# Patient Record
Sex: Female | Born: 1940
Health system: Southern US, Community
[De-identification: ages and names within clinical notes are randomized; demographics above are authoritative.]

## PROBLEM LIST (undated history)

## (undated) DIAGNOSIS — I639 Cerebral infarction, unspecified: Secondary | ICD-10-CM

## (undated) DIAGNOSIS — J45909 Unspecified asthma, uncomplicated: Secondary | ICD-10-CM

## (undated) DIAGNOSIS — F419 Anxiety disorder, unspecified: Secondary | ICD-10-CM

## (undated) DIAGNOSIS — R51 Headache: Secondary | ICD-10-CM

## (undated) DIAGNOSIS — F32A Depression, unspecified: Secondary | ICD-10-CM

## (undated) DIAGNOSIS — C50919 Malignant neoplasm of unspecified site of unspecified female breast: Secondary | ICD-10-CM

## (undated) DIAGNOSIS — R519 Headache, unspecified: Secondary | ICD-10-CM

## (undated) DIAGNOSIS — K219 Gastro-esophageal reflux disease without esophagitis: Secondary | ICD-10-CM

## (undated) DIAGNOSIS — E785 Hyperlipidemia, unspecified: Secondary | ICD-10-CM

## (undated) DIAGNOSIS — D649 Anemia, unspecified: Secondary | ICD-10-CM

## (undated) DIAGNOSIS — G4730 Sleep apnea, unspecified: Secondary | ICD-10-CM

## (undated) DIAGNOSIS — M1990 Unspecified osteoarthritis, unspecified site: Secondary | ICD-10-CM

## (undated) DIAGNOSIS — C801 Malignant (primary) neoplasm, unspecified: Secondary | ICD-10-CM

## (undated) DIAGNOSIS — F329 Major depressive disorder, single episode, unspecified: Secondary | ICD-10-CM

## (undated) HISTORY — PX: NASAL SINUS SURGERY: SHX719

## (undated) HISTORY — PX: ABDOMINAL HYSTERECTOMY: SHX81

## (undated) HISTORY — DX: Cerebral infarction, unspecified: I63.9

## (undated) HISTORY — DX: Malignant neoplasm of unspecified site of unspecified female breast: C50.919

## (undated) HISTORY — PX: CHOLECYSTECTOMY: SHX55

## (undated) HISTORY — DX: Unspecified asthma, uncomplicated: J45.909

## (undated) HISTORY — DX: Anxiety disorder, unspecified: F41.9

## (undated) HISTORY — DX: Hyperlipidemia, unspecified: E78.5

## (undated) HISTORY — DX: Major depressive disorder, single episode, unspecified: F32.9

## (undated) HISTORY — DX: Depression, unspecified: F32.A

## (undated) HISTORY — DX: Gastro-esophageal reflux disease without esophagitis: K21.9

## (undated) HISTORY — PX: BLADDER REPAIR: SHX76

---

## 1991-03-23 HISTORY — PX: BREAST BIOPSY: SHX20

## 2002-02-26 DIAGNOSIS — I639 Cerebral infarction, unspecified: Secondary | ICD-10-CM

## 2002-02-26 HISTORY — DX: Cerebral infarction, unspecified: I63.9

## 2003-02-27 HISTORY — PX: COLONOSCOPY: SHX174

## 2003-09-19 ENCOUNTER — Other Ambulatory Visit: Payer: Self-pay

## 2003-11-29 ENCOUNTER — Ambulatory Visit: Payer: Self-pay

## 2004-02-18 ENCOUNTER — Ambulatory Visit: Payer: Self-pay | Admitting: Rheumatology

## 2004-03-02 ENCOUNTER — Ambulatory Visit: Payer: Self-pay

## 2004-03-20 ENCOUNTER — Ambulatory Visit: Payer: Self-pay

## 2004-04-05 ENCOUNTER — Ambulatory Visit: Payer: Self-pay

## 2004-06-20 ENCOUNTER — Ambulatory Visit: Payer: Self-pay | Admitting: Orthopedic Surgery

## 2006-08-07 ENCOUNTER — Ambulatory Visit: Payer: Self-pay

## 2007-07-01 ENCOUNTER — Emergency Department: Payer: Self-pay | Admitting: Emergency Medicine

## 2008-01-08 DIAGNOSIS — I10 Essential (primary) hypertension: Secondary | ICD-10-CM | POA: Insufficient documentation

## 2008-01-08 DIAGNOSIS — K219 Gastro-esophageal reflux disease without esophagitis: Secondary | ICD-10-CM | POA: Insufficient documentation

## 2008-01-08 DIAGNOSIS — J45909 Unspecified asthma, uncomplicated: Secondary | ICD-10-CM | POA: Insufficient documentation

## 2008-02-05 ENCOUNTER — Ambulatory Visit: Payer: Self-pay | Admitting: Family Medicine

## 2008-05-10 ENCOUNTER — Ambulatory Visit: Payer: Self-pay | Admitting: Family Medicine

## 2008-06-30 ENCOUNTER — Ambulatory Visit: Payer: Self-pay | Admitting: Family Medicine

## 2009-04-27 ENCOUNTER — Ambulatory Visit: Payer: Self-pay | Admitting: Family Medicine

## 2009-06-09 ENCOUNTER — Ambulatory Visit: Payer: Self-pay | Admitting: Family Medicine

## 2010-02-26 HISTORY — PX: MR MRA CAROTID: HXRAD135

## 2010-03-21 ENCOUNTER — Observation Stay: Payer: Self-pay | Admitting: Internal Medicine

## 2010-12-18 ENCOUNTER — Ambulatory Visit: Payer: Self-pay | Admitting: Family Medicine

## 2011-03-27 ENCOUNTER — Ambulatory Visit: Payer: Self-pay | Admitting: Family Medicine

## 2011-12-20 ENCOUNTER — Ambulatory Visit: Payer: Self-pay | Admitting: Family Medicine

## 2011-12-20 LAB — COMPREHENSIVE METABOLIC PANEL
Alkaline Phosphatase: 86 U/L (ref 50–136)
Anion Gap: 5 — ABNORMAL LOW (ref 7–16)
BUN: 13 mg/dL (ref 7–18)
Calcium, Total: 9.5 mg/dL (ref 8.5–10.1)
Chloride: 108 mmol/L — ABNORMAL HIGH (ref 98–107)
Co2: 29 mmol/L (ref 21–32)
EGFR (Non-African Amer.): >60
Glucose: 89 mg/dL (ref 65–99)
Osmolality: 283 (ref 275–301)
SGOT(AST): 21 U/L (ref 15–37)
Sodium: 142 mmol/L (ref 136–145)

## 2011-12-20 LAB — LIPASE, BLOOD: Lipase: 125 U/L (ref 73–393)

## 2011-12-20 LAB — CBC WITH DIFFERENTIAL/PLATELET
Basophil #: 0 10*3/uL (ref 0.0–0.1)
Eosinophil #: 0.1 10*3/uL (ref 0.0–0.7)
Lymphocyte %: 39.7 %
MCH: 30.8 pg (ref 26.0–34.0)
MCHC: 33.9 g/dL (ref 32.0–36.0)
Monocyte #: 0.6 x10 3/mm (ref 0.2–0.9)
Neutrophil %: 50.7 %
Platelet: 301 10*3/uL (ref 150–440)
RDW: 13.3 % (ref 11.5–14.5)

## 2011-12-20 LAB — TSH: Thyroid Stimulating Horm: 0.984 u[IU]/mL

## 2012-02-05 ENCOUNTER — Ambulatory Visit: Payer: Self-pay | Admitting: Family Medicine

## 2012-07-18 ENCOUNTER — Other Ambulatory Visit: Payer: Self-pay | Admitting: Family Medicine

## 2012-07-18 LAB — CBC WITH DIFFERENTIAL/PLATELET
Basophil #: 0.1 10*3/uL (ref 0.0–0.1)
Eosinophil %: 1.6 %
HCT: 39.2 % (ref 35.0–47.0)
HGB: 13 g/dL (ref 12.0–16.0)
Lymphocyte %: 32.2 %
MCHC: 33.3 g/dL (ref 32.0–36.0)
MCV: 90 fL (ref 80–100)
Neutrophil #: 5.2 10*3/uL (ref 1.4–6.5)
Neutrophil %: 56.9 %
Platelet: 286 10*3/uL (ref 150–440)
RBC: 4.38 10*6/uL (ref 3.80–5.20)
WBC: 9.2 10*3/uL (ref 3.6–11.0)

## 2012-07-18 LAB — COMPREHENSIVE METABOLIC PANEL
Albumin: 3.8 g/dL (ref 3.4–5.0)
Alkaline Phosphatase: 109 U/L (ref 50–136)
Anion Gap: 2 — ABNORMAL LOW (ref 7–16)
BUN: 14 mg/dL (ref 7–18)
Bilirubin,Total: 0.3 mg/dL (ref 0.2–1.0)
Calcium, Total: 9.5 mg/dL (ref 8.5–10.1)
Chloride: 109 mmol/L — ABNORMAL HIGH (ref 98–107)
Co2: 31 mmol/L (ref 21–32)
Creatinine: 0.87 mg/dL (ref 0.60–1.30)
EGFR (African American): >60
Glucose: 115 mg/dL — ABNORMAL HIGH (ref 65–99)
Osmolality: 285 (ref 275–301)
Potassium: 4.1 mmol/L (ref 3.5–5.1)
SGOT(AST): 17 U/L (ref 15–37)
SGPT (ALT): 21 U/L (ref 12–78)
Sodium: 142 mmol/L (ref 136–145)

## 2012-07-18 LAB — FOLATE: Folic Acid: 14.7 ng/mL (ref 3.1–100.0)

## 2012-09-12 ENCOUNTER — Other Ambulatory Visit: Payer: Self-pay | Admitting: Radiology

## 2012-12-31 ENCOUNTER — Ambulatory Visit: Payer: Self-pay | Admitting: Family Medicine

## 2013-01-01 ENCOUNTER — Other Ambulatory Visit: Payer: Self-pay | Admitting: Family Medicine

## 2013-01-01 LAB — CBC WITH DIFFERENTIAL/PLATELET
Basophil #: 0 10*3/uL (ref 0.0–0.1)
Eosinophil #: 0.1 10*3/uL (ref 0.0–0.7)
Eosinophil %: 1.2 %
HCT: 39.6 % (ref 35.0–47.0)
Lymphocyte #: 2.6 10*3/uL (ref 1.0–3.6)
MCH: 30.7 pg (ref 26.0–34.0)
MCHC: 33.9 g/dL (ref 32.0–36.0)
MCV: 91 fL (ref 80–100)
Monocyte #: 0.6 x10 3/mm (ref 0.2–0.9)
Monocyte %: 9 %
Neutrophil #: 3.5 10*3/uL (ref 1.4–6.5)
Neutrophil %: 51.6 %
Platelet: 269 10*3/uL (ref 150–440)
RBC: 4.37 10*6/uL (ref 3.80–5.20)
RDW: 13.5 % (ref 11.5–14.5)
WBC: 6.8 10*3/uL (ref 3.6–11.0)

## 2013-01-01 LAB — COMPREHENSIVE METABOLIC PANEL
Albumin: 3.9 g/dL (ref 3.4–5.0)
Anion Gap: 4 — ABNORMAL LOW (ref 7–16)
Chloride: 108 mmol/L — ABNORMAL HIGH (ref 98–107)
Creatinine: 0.71 mg/dL (ref 0.60–1.30)
Glucose: 104 mg/dL — ABNORMAL HIGH (ref 65–99)
Potassium: 4.1 mmol/L (ref 3.5–5.1)
SGPT (ALT): 18 U/L (ref 12–78)
Total Protein: 7 g/dL (ref 6.4–8.2)

## 2013-01-01 LAB — FOLATE: Folic Acid: 14.3 ng/mL (ref 3.1–100.0)

## 2013-01-01 LAB — LIPID PANEL
Ldl Cholesterol, Calc: 121 mg/dL — ABNORMAL HIGH (ref 0–100)
Triglycerides: 130 mg/dL (ref 0–200)

## 2013-01-01 LAB — HEMOGLOBIN A1C: Hemoglobin A1C: 6.7 % — ABNORMAL HIGH (ref 4.2–6.3)

## 2013-01-01 LAB — PRO B NATRIURETIC PEPTIDE: B-Type Natriuretic Peptide: 91 pg/mL (ref 0–125)

## 2013-02-10 ENCOUNTER — Other Ambulatory Visit: Payer: Self-pay | Admitting: Family Medicine

## 2013-02-10 LAB — COMPREHENSIVE METABOLIC PANEL
Alkaline Phosphatase: 93 U/L
Anion Gap: 2 — ABNORMAL LOW (ref 7–16)
BUN: 10 mg/dL (ref 7–18)
Bilirubin,Total: 0.7 mg/dL (ref 0.2–1.0)
Creatinine: 0.66 mg/dL (ref 0.60–1.30)
EGFR (African American): >60
SGOT(AST): 23 U/L (ref 15–37)
Sodium: 139 mmol/L (ref 136–145)
Total Protein: 7.1 g/dL (ref 6.4–8.2)

## 2013-02-10 LAB — CK: CK, Total: 78 U/L (ref 21–215)

## 2013-02-10 LAB — LIPID PANEL
HDL Cholesterol: 53 mg/dL (ref 40–60)
Triglycerides: 117 mg/dL (ref 0–200)

## 2013-03-24 ENCOUNTER — Other Ambulatory Visit: Payer: Self-pay | Admitting: Family Medicine

## 2013-03-24 LAB — CBC WITH DIFFERENTIAL/PLATELET
BASOS ABS: 0 10*3/uL (ref 0.0–0.1)
BASOS PCT: 0.7 %
EOS PCT: 2.9 %
Eosinophil #: 0.2 10*3/uL (ref 0.0–0.7)
HCT: 42.1 % (ref 35.0–47.0)
HGB: 13.8 g/dL (ref 12.0–16.0)
Lymphocyte #: 2.5 10*3/uL (ref 1.0–3.6)
Lymphocyte %: 39.3 %
MCH: 30.1 pg (ref 26.0–34.0)
MCHC: 32.7 g/dL (ref 32.0–36.0)
MCV: 92 fL (ref 80–100)
Monocyte #: 0.7 x10 3/mm (ref 0.2–0.9)
Monocyte %: 11 %
Neutrophil #: 2.9 10*3/uL (ref 1.4–6.5)
Neutrophil %: 46.1 %
PLATELETS: 261 10*3/uL (ref 150–440)
RBC: 4.58 10*6/uL (ref 3.80–5.20)
RDW: 13.8 % (ref 11.5–14.5)
WBC: 6.2 10*3/uL (ref 3.6–11.0)

## 2013-03-24 LAB — TSH: Thyroid Stimulating Horm: 1.41 u[IU]/mL

## 2013-03-24 LAB — COMPREHENSIVE METABOLIC PANEL
ALBUMIN: 3.8 g/dL (ref 3.4–5.0)
AST: 20 U/L (ref 15–37)
Alkaline Phosphatase: 92 U/L
Anion Gap: 5 — ABNORMAL LOW (ref 7–16)
BUN: 10 mg/dL (ref 7–18)
Bilirubin,Total: 0.6 mg/dL (ref 0.2–1.0)
CHLORIDE: 105 mmol/L (ref 98–107)
CREATININE: 0.59 mg/dL — AB (ref 0.60–1.30)
Calcium, Total: 9.6 mg/dL (ref 8.5–10.1)
Co2: 27 mmol/L (ref 21–32)
EGFR (African American): >60
EGFR (Non-African Amer.): >60
Glucose: 110 mg/dL — ABNORMAL HIGH (ref 65–99)
Osmolality: 274 (ref 275–301)
Potassium: 3.7 mmol/L (ref 3.5–5.1)
SGPT (ALT): 16 U/L (ref 12–78)
SODIUM: 137 mmol/L (ref 136–145)
TOTAL PROTEIN: 7.1 g/dL (ref 6.4–8.2)

## 2013-06-22 ENCOUNTER — Other Ambulatory Visit: Payer: Self-pay | Admitting: Family Medicine

## 2013-06-22 LAB — CBC WITH DIFFERENTIAL/PLATELET
Basophil #: 0 10*3/uL (ref 0.0–0.1)
Basophil %: 0.5 %
EOS PCT: 1.4 %
Eosinophil #: 0.1 10*3/uL (ref 0.0–0.7)
HCT: 41.8 % (ref 35.0–47.0)
HGB: 14.1 g/dL (ref 12.0–16.0)
Lymphocyte #: 2.3 10*3/uL (ref 1.0–3.6)
Lymphocyte %: 34.4 %
MCH: 30.8 pg (ref 26.0–34.0)
MCHC: 33.7 g/dL (ref 32.0–36.0)
MCV: 92 fL (ref 80–100)
Monocyte #: 0.6 x10 3/mm (ref 0.2–0.9)
Monocyte %: 9.2 %
Neutrophil #: 3.7 10*3/uL (ref 1.4–6.5)
Neutrophil %: 54.5 %
Platelet: 251 10*3/uL (ref 150–440)
RBC: 4.56 10*6/uL (ref 3.80–5.20)
RDW: 14.1 % (ref 11.5–14.5)
WBC: 6.8 10*3/uL (ref 3.6–11.0)

## 2013-06-22 LAB — COMPREHENSIVE METABOLIC PANEL
ANION GAP: 5 — AB (ref 7–16)
AST: 18 U/L (ref 15–37)
Albumin: 3.7 g/dL (ref 3.4–5.0)
Alkaline Phosphatase: 93 U/L
BUN: 9 mg/dL (ref 7–18)
Bilirubin,Total: 0.6 mg/dL (ref 0.2–1.0)
CHLORIDE: 108 mmol/L — AB (ref 98–107)
CO2: 27 mmol/L (ref 21–32)
Calcium, Total: 9.3 mg/dL (ref 8.5–10.1)
Creatinine: 0.62 mg/dL (ref 0.60–1.30)
Glucose: 113 mg/dL — ABNORMAL HIGH (ref 65–99)
Osmolality: 279 (ref 275–301)
Potassium: 4.7 mmol/L (ref 3.5–5.1)
SGPT (ALT): 19 U/L (ref 12–78)
Sodium: 140 mmol/L (ref 136–145)
Total Protein: 6.9 g/dL (ref 6.4–8.2)

## 2013-06-22 LAB — HEMOGLOBIN A1C: Hemoglobin A1C: 6.6 % — ABNORMAL HIGH (ref 4.2–6.3)

## 2013-06-22 LAB — LIPID PANEL
CHOLESTEROL: 198 mg/dL (ref 0–200)
HDL: 45 mg/dL (ref 40–60)
Ldl Cholesterol, Calc: 131 mg/dL — ABNORMAL HIGH (ref 0–100)
Triglycerides: 112 mg/dL (ref 0–200)
VLDL Cholesterol, Calc: 22 mg/dL (ref 5–40)

## 2013-06-22 LAB — TSH: THYROID STIMULATING HORM: 0.596 u[IU]/mL

## 2013-06-25 ENCOUNTER — Encounter: Payer: Self-pay | Admitting: General Surgery

## 2013-07-22 ENCOUNTER — Ambulatory Visit: Payer: Self-pay | Admitting: Family Medicine

## 2013-07-22 ENCOUNTER — Ambulatory Visit: Payer: Self-pay | Admitting: General Surgery

## 2013-07-30 ENCOUNTER — Ambulatory Visit (INDEPENDENT_AMBULATORY_CARE_PROVIDER_SITE_OTHER): Payer: Medicare Other | Admitting: General Surgery

## 2013-07-30 ENCOUNTER — Encounter: Payer: Self-pay | Admitting: General Surgery

## 2013-07-30 VITALS — BP 150/70 | HR 76 | Resp 14 | Ht 63.0 in | Wt 173.0 lb

## 2013-07-30 DIAGNOSIS — Z1211 Encounter for screening for malignant neoplasm of colon: Secondary | ICD-10-CM

## 2013-07-30 MED ORDER — POLYETHYLENE GLYCOL 3350 17 GM/SCOOP PO POWD: 1.0000 | Freq: Once | ORAL | Status: DC

## 2013-07-30 NOTE — Patient Instructions (Addendum)
Colonoscopy A colonoscopy is an exam to look at the entire large intestine (colon). This exam can help find problems such as tumors, polyps, inflammation, and areas of bleeding. The exam takes about 1 hour.  LET [facility] CARE PROVIDER KNOW ABOUT:   Any allergies you have.  All medicines you are taking, including vitamins, herbs, eye drops, creams, and over-the-counter medicines.  Previous problems you or members of your family have had with the use of anesthetics.  Any blood disorders you have.  Previous surgeries you have had.  Medical conditions you have. RISKS AND COMPLICATIONS  Generally, this is a safe procedure. However, as with any procedure, complications can occur. Possible complications include:  Bleeding.  Tearing or rupture of the colon wall.  Reaction to medicines given during the exam.  Infection (rare). BEFORE THE PROCEDURE   Ask your health care provider about changing or stopping your regular medicines.  You may be prescribed an oral bowel prep. This involves drinking a large amount of medicated liquid, starting the day before your procedure. The liquid will cause you to have multiple loose stools until your stool is almost clear or light green. This cleans out your colon in preparation for the procedure.  Do not eat or drink anything else once you have started the bowel prep, unless your health care provider tells you it is safe to do so.  Arrange for someone to drive you home after the procedure. PROCEDURE   You will be given medicine to help you relax (sedative).  You will lie on your side with your knees bent.  A long, flexible tube with a light and camera on the end (colonoscope) will be inserted through the rectum and into the colon. The camera sends video back to a computer screen as it moves through the colon. The colonoscope also releases carbon dioxide gas to inflate the colon. This helps your health care provider see the area better.  During  the exam, your health care provider may take a small tissue sample (biopsy) to be examined under a microscope if any abnormalities are found.  The exam is finished when the entire colon has been viewed. AFTER THE PROCEDURE   Do not drive for 24 hours after the exam.  You may have a small amount of blood in your stool.  You may pass moderate amounts of gas and have mild abdominal cramping or bloating. This is caused by the gas used to inflate your colon during the exam.  Ask when your test results will be ready and how you will get your results. Make sure you get your test results. Document Released: 02/10/2000 Document Revised: 12/03/2012 Document Reviewed: 10/20/2012 [facility] Patient Information 2014 [place].  Patient is scheduled for a colonoscopy at [facility] on 09/08/13. She will hold her Plavix for one week prior. Patient is aware to pre register with the hospital at least 2 days prior. Miralax prescription has been sent into her pharmacy. Patient is aware of date and instructions.

## 2013-07-30 NOTE — Progress Notes (Signed)
Patient ID: Norma Johns, female   DOB: 03-11-1940, 73 y.o.   MRN: [id number]  Chief Complaint  Patient presents with  . Other    screening colonoscopy    HPI Norma Johns is a 73 y.o. female.  Here today to discuss having a colonoscopy. States that she had a Colonoscopy 10 years ago. Denies any gastrointestinal issues. She has been maintained on Plavix since a CVA in 2004.   HPI  Past Medical History  Diagnosis Date  . GERD (gastroesophageal reflux disease)   . Diabetes mellitus without complication   . Hyperlipidemia   . Asthma   . Stroke 2004    Past Surgical History  Procedure Laterality Date  . Abdominal hysterectomy    . Bladder repair    . Breast biopsy    . Cholecystectomy    . Colonoscopy  2005    Family History  Problem Relation Age of Onset  . Cancer Father   . Heart attack Mother     Social History History  Substance Use Topics  . Smoking status: Never Smoker   . Smokeless tobacco: Never Used  . Alcohol Use: No    Allergies  Allergen Reactions  . Minocycline Hcl Itching  . Penicillins Itching  . Septra [Sulfamethoxazole-Trimethoprim]   . Vioxx [Rofecoxib]     Current Outpatient Prescriptions  Medication Sig Dispense Refill  . albuterol (PROVENTIL) (2.5 MG/3ML) 0.083% nebulizer solution Take 2.5 mg by nebulization every 6 (six) hours as needed for wheezing or shortness of breath.      Marland Kitchen aspirin 81 MG tablet Take 81 mg by mouth daily.      . ciprofloxacin (CIPRO) 250 MG tablet       . clopidogrel (PLAVIX) 75 MG tablet       . dexlansoprazole (DEXILANT) 60 MG capsule Take 60 mg by mouth daily.      Marland Kitchen ESTRACE VAGINAL 0.1 MG/GM vaginal cream       . fexofenadine (ALLEGRA) 180 MG tablet Take 180 mg by mouth daily.      . fluticasone (FLONASE) 50 MCG/ACT nasal spray       . Multiple Vitamin (MULTIVITAMIN) capsule Take 1 capsule by mouth daily.      . vitamin B-12 (CYANOCOBALAMIN) 500 MCG tablet Take 500 mcg by mouth daily.      . vitamin E  (VITAMIN E) 400 UNIT capsule Take 400 Units by mouth daily.      . polyethylene glycol powder (GLYCOLAX/MIRALAX) powder Take 255 g (1 Container total) by mouth once.  255 g  0   No current facility-administered medications for this visit.    Review of Systems Review of Systems  Constitutional: Negative.   Respiratory: Negative.   Cardiovascular: Negative.   Gastrointestinal: Positive for constipation and abdominal distention. Negative for nausea, vomiting, abdominal pain, diarrhea, blood in stool, anal bleeding and rectal pain.    Blood pressure 150/70, pulse 76, resp. rate 14, height 5\' 3"  (1.6 m), weight 173 lb (78.472 kg).  Physical Exam Physical Exam  Constitutional: She is oriented to person, place, and time. She appears well-developed and well-nourished.  Eyes: Conjunctivae are normal. No scleral icterus.  Neck: Neck supple.  Cardiovascular: Normal rate, regular rhythm and normal heart sounds.   Pulmonary/Chest: Effort normal and breath sounds normal.  Abdominal: Soft. Normal appearance and bowel sounds are normal. There is no tenderness.  Neurological: She is alert and oriented to person, place, and time.  Skin: Skin is warm and dry.  Data Reviewed Primary care note stated June 19, 2013 were reviewed.  Assessment    Candidate for screening colonoscopy.  Need to discontinue Plavix therapy prior to the procedure.     Plan    The risks associated with screening colonoscopy were reviewed including bleeding and perforation. The need to discontinue Plavix to minimize excessive bleeding should polypectomy required was reviewed. She should continue her 81 mg aspirin tablet daily. Written instructions regarding preparation were provided.     Patient is scheduled for a colonoscopy at [facility] on 09/08/13. She will hold her Plavix for one week prior. Patient is aware to pre register with the hospital at least 2 days prior. Miralax prescription has been sent into her pharmacy.  Patient is aware of date and instructions.   PCP: Lelan Pons Byrnett 07/31/2013, 9:26 PM

## 2013-07-31 DIAGNOSIS — Z1211 Encounter for screening for malignant neoplasm of colon: Secondary | ICD-10-CM | POA: Insufficient documentation

## 2013-08-05 ENCOUNTER — Other Ambulatory Visit: Payer: Self-pay | Admitting: General Surgery

## 2013-08-05 DIAGNOSIS — Z1211 Encounter for screening for malignant neoplasm of colon: Secondary | ICD-10-CM

## 2013-09-02 ENCOUNTER — Telehealth: Payer: Self-pay | Admitting: *Deleted

## 2013-09-02 NOTE — Telephone Encounter (Signed)
Message left for patient to call the office.   Patient is scheduled for a colonoscopy at [facility] for 09-08-13. We need to verify that the patient has not had a change in her medications since her last office visit. Also, need to verify that she has pre-registered and has her Miralax prescription.  Please confirm the patient has discontinued her Plavix.

## 2013-09-03 ENCOUNTER — Telehealth: Payer: Self-pay | Admitting: *Deleted

## 2013-09-03 NOTE — Telephone Encounter (Signed)
Another message has been left for patient to call the office.

## 2013-09-07 ENCOUNTER — Telehealth: Payer: Self-pay | Admitting: *Deleted

## 2013-09-07 NOTE — Telephone Encounter (Signed)
Another message left for patient to call the office.

## 2013-09-08 ENCOUNTER — Ambulatory Visit: Payer: Self-pay | Admitting: General Surgery

## 2013-09-08 DIAGNOSIS — D128 Benign neoplasm of rectum: Secondary | ICD-10-CM

## 2013-09-08 DIAGNOSIS — D129 Benign neoplasm of anus and anal canal: Secondary | ICD-10-CM

## 2013-09-09 ENCOUNTER — Encounter: Payer: Self-pay | Admitting: General Surgery

## 2013-09-10 ENCOUNTER — Telehealth: Payer: Self-pay | Admitting: *Deleted

## 2013-09-10 LAB — PATHOLOGY REPORT

## 2013-09-10 NOTE — Telephone Encounter (Signed)
Message copied by Carson Myrtle on Thu Sep 10, 2013  4:22 PM ------      Message from: [place], [place]      Created: Thu Sep 10, 2013  4:21 PM       These notify the patient that the tissue removed from her recent colonoscopy was entirely benign. She is off the hook for 10 years or 100,000 miles, whichever comes first. ------

## 2013-09-11 ENCOUNTER — Encounter: Payer: Self-pay | Admitting: General Surgery

## 2013-09-11 NOTE — Telephone Encounter (Signed)
Left message for patient call our office back. Patient needs to be notified of resent colonoscopy results.

## 2013-09-15 ENCOUNTER — Telehealth: Payer: Self-pay

## 2013-09-15 NOTE — Telephone Encounter (Signed)
Patient called and is looking for her pathology results. She left the number to her sisters home as she does not have a phone any longer. She said that if she is not there when you call that you may leave the results with her sister Rosaleigh Brazzel has been signed.

## 2013-09-16 NOTE — Telephone Encounter (Signed)
Path on polyp fine. Repeat exam in 10 years.

## 2013-12-20 ENCOUNTER — Emergency Department: Payer: Self-pay | Admitting: Emergency Medicine

## 2013-12-20 LAB — URINALYSIS, COMPLETE
RBC,UR: 40303 /HPF (ref 0–5)
SPECIFIC GRAVITY: 1.01 (ref 1.003–1.030)
Squamous Epithelial: NONE SEEN

## 2013-12-20 LAB — BASIC METABOLIC PANEL
Anion Gap: 7 (ref 7–16)
BUN: 14 mg/dL (ref 7–18)
CALCIUM: 9.5 mg/dL (ref 8.5–10.1)
CO2: 26 mmol/L (ref 21–32)
CREATININE: 0.65 mg/dL (ref 0.60–1.30)
Chloride: 107 mmol/L (ref 98–107)
EGFR (Non-African Amer.): >60
GLUCOSE: 149 mg/dL — AB (ref 65–99)
Osmolality: 283 (ref 275–301)
Potassium: 4.1 mmol/L (ref 3.5–5.1)
Sodium: 140 mmol/L (ref 136–145)

## 2013-12-20 LAB — CBC
HCT: 40.3 % (ref 35.0–47.0)
HGB: 13.1 g/dL (ref 12.0–16.0)
MCH: 29.6 pg (ref 26.0–34.0)
MCHC: 32.4 g/dL (ref 32.0–36.0)
MCV: 91 fL (ref 80–100)
Platelet: 256 10*3/uL (ref 150–440)
RBC: 4.4 10*6/uL (ref 3.80–5.20)
RDW: 14 % (ref 11.5–14.5)
WBC: 17.3 10*3/uL — ABNORMAL HIGH (ref 3.6–11.0)

## 2013-12-22 LAB — URINE CULTURE

## 2013-12-23 ENCOUNTER — Ambulatory Visit: Payer: Self-pay | Admitting: Family Medicine

## 2013-12-23 LAB — CBC WITH DIFFERENTIAL/PLATELET
BASOS ABS: 0 10*3/uL (ref 0.0–0.1)
Basophil %: 0.6 %
EOS PCT: 1.5 %
Eosinophil #: 0.1 10*3/uL (ref 0.0–0.7)
HCT: 38.6 % (ref 35.0–47.0)
HGB: 12.7 g/dL (ref 12.0–16.0)
LYMPHS ABS: 2.4 10*3/uL (ref 1.0–3.6)
Lymphocyte %: 30.9 %
MCH: 30.4 pg (ref 26.0–34.0)
MCHC: 32.9 g/dL (ref 32.0–36.0)
MCV: 92 fL (ref 80–100)
MONOS PCT: 9.7 %
Monocyte #: 0.7 x10 3/mm (ref 0.2–0.9)
NEUTROS PCT: 57.3 %
Neutrophil #: 4.4 10*3/uL (ref 1.4–6.5)
PLATELETS: 279 10*3/uL (ref 150–440)
RBC: 4.18 10*6/uL (ref 3.80–5.20)
RDW: 13.5 % (ref 11.5–14.5)
WBC: 7.7 10*3/uL (ref 3.6–11.0)

## 2013-12-28 ENCOUNTER — Encounter: Payer: Self-pay | Admitting: General Surgery

## 2014-05-11 ENCOUNTER — Other Ambulatory Visit: Payer: Self-pay | Admitting: Family Medicine

## 2014-05-11 DIAGNOSIS — R7309 Other abnormal glucose: Secondary | ICD-10-CM | POA: Diagnosis not present

## 2014-05-11 DIAGNOSIS — N3001 Acute cystitis with hematuria: Secondary | ICD-10-CM | POA: Diagnosis not present

## 2014-05-11 DIAGNOSIS — F419 Anxiety disorder, unspecified: Secondary | ICD-10-CM | POA: Diagnosis not present

## 2014-05-11 DIAGNOSIS — R5383 Other fatigue: Secondary | ICD-10-CM | POA: Diagnosis not present

## 2014-05-11 DIAGNOSIS — E780 Pure hypercholesterolemia: Secondary | ICD-10-CM | POA: Diagnosis not present

## 2014-05-24 DIAGNOSIS — F329 Major depressive disorder, single episode, unspecified: Secondary | ICD-10-CM | POA: Diagnosis not present

## 2014-05-24 DIAGNOSIS — N3001 Acute cystitis with hematuria: Secondary | ICD-10-CM | POA: Diagnosis not present

## 2014-08-16 ENCOUNTER — Other Ambulatory Visit: Payer: Self-pay | Admitting: Family Medicine

## 2014-08-16 DIAGNOSIS — K219 Gastro-esophageal reflux disease without esophagitis: Secondary | ICD-10-CM | POA: Insufficient documentation

## 2014-08-24 ENCOUNTER — Other Ambulatory Visit: Payer: Self-pay

## 2014-08-24 DIAGNOSIS — N952 Postmenopausal atrophic vaginitis: Secondary | ICD-10-CM | POA: Insufficient documentation

## 2014-08-24 DIAGNOSIS — Z6829 Body mass index (BMI) 29.0-29.9, adult: Secondary | ICD-10-CM | POA: Insufficient documentation

## 2014-08-24 DIAGNOSIS — H0520 Unspecified exophthalmos: Secondary | ICD-10-CM | POA: Insufficient documentation

## 2014-08-24 DIAGNOSIS — M810 Age-related osteoporosis without current pathological fracture: Secondary | ICD-10-CM | POA: Insufficient documentation

## 2014-08-24 DIAGNOSIS — J341 Cyst and mucocele of nose and nasal sinus: Secondary | ICD-10-CM | POA: Insufficient documentation

## 2014-08-24 DIAGNOSIS — F419 Anxiety disorder, unspecified: Secondary | ICD-10-CM | POA: Insufficient documentation

## 2014-08-24 DIAGNOSIS — M1990 Unspecified osteoarthritis, unspecified site: Secondary | ICD-10-CM | POA: Insufficient documentation

## 2014-09-06 ENCOUNTER — Ambulatory Visit (INDEPENDENT_AMBULATORY_CARE_PROVIDER_SITE_OTHER): Payer: Medicare Other | Admitting: Family Medicine

## 2014-09-06 ENCOUNTER — Encounter: Payer: Self-pay | Admitting: Family Medicine

## 2014-09-06 VITALS — BP 138/86 | HR 64 | Temp 97.7°F | Resp 16 | Ht 64.0 in | Wt 170.0 lb

## 2014-09-06 DIAGNOSIS — F32A Depression, unspecified: Secondary | ICD-10-CM

## 2014-09-06 DIAGNOSIS — I6529 Occlusion and stenosis of unspecified carotid artery: Secondary | ICD-10-CM | POA: Diagnosis not present

## 2014-09-06 DIAGNOSIS — I10 Essential (primary) hypertension: Secondary | ICD-10-CM | POA: Diagnosis not present

## 2014-09-06 DIAGNOSIS — F329 Major depressive disorder, single episode, unspecified: Secondary | ICD-10-CM | POA: Diagnosis not present

## 2014-09-06 DIAGNOSIS — K219 Gastro-esophageal reflux disease without esophagitis: Secondary | ICD-10-CM

## 2014-09-06 DIAGNOSIS — E119 Type 2 diabetes mellitus without complications: Secondary | ICD-10-CM

## 2014-09-06 MED ORDER — CLOPIDOGREL BISULFATE 75 MG PO TABS: 75.0000 mg | ORAL_TABLET | Freq: Every day | ORAL | Status: DC

## 2014-09-06 MED ORDER — DEXLANSOPRAZOLE 60 MG PO CPDR: 60.0000 mg | DELAYED_RELEASE_CAPSULE | Freq: Every day | ORAL | Status: DC

## 2014-09-06 NOTE — Progress Notes (Signed)
Subjective:    Patient ID: Norma Johns, female    DOB: 1940-05-25, 74 y.o.   MRN: [id number]  HPI     Depression: Patient here to follow up on depression. The last office visit was 05/24/2011. Onset followed a family event (health problems (husband)).  Patient denies any depression symptoms. Patient reports that her husband is at home on Hospice.  Going ok. Has enough support to keep him at home.  Has been together 15 years. Met in high school.   For other medical problems, stable. Did have labs done in March. Is due to have them redrawn, but would like to wait until after the death of her husband.  Will call.    Review of Systems  Constitutional: Negative.  Negative for chills.  Cardiovascular: Negative.   Musculoskeletal: Positive for arthralgias.  Psychiatric/Behavioral: Negative.    Patient Active Problem List   Diagnosis Date Noted  . Anxiety 08/24/2014  . Atrophic vaginitis 08/24/2014  . Body mass index (BMI) of 29.0-29.9 in adult 08/24/2014  . Cyst of nasal sinus 08/24/2014  . Degenerative joint disease 08/24/2014  . OP (osteoporosis) 08/24/2014  . Bulging eyes 08/24/2014  . GERD (gastroesophageal reflux disease) 08/16/2014  . Encounter for screening colonoscopy 07/31/2013  . Calcium blood increased 01/26/2009  . History of colon polyps 09/21/2008  . Chronic recurrent sinusitis 09/17/2008  . Diabetes mellitus, type 2 08/03/2008  . Abnormal blood sugar 06/22/2008  . Hypercholesteremia 06/22/2008  . Cannot sleep 03/23/2008  . Allergic rhinitis 01/08/2008  . Airway hyperreactivity 01/08/2008  . Carotid artery obstruction 01/08/2008  . Clinical depression 01/08/2008  . Acid reflux 01/08/2008  . Benign hypertension 01/08/2008   Past Medical History  Diagnosis Date  . GERD (gastroesophageal reflux disease)   . Diabetes mellitus without complication   . Hyperlipidemia   . Asthma   . Stroke 2004  . Depression    Current Outpatient Prescriptions on File Prior  to Visit  Medication Sig  . albuterol (PROVENTIL) (2.5 MG/3ML) 0.083% nebulizer solution Take 2.5 mg by nebulization every 6 (six) hours as needed for wheezing or shortness of breath.  . ALPRAZolam (XANAX) 0.5 MG tablet Take by mouth.  Marland Kitchen aspirin 81 MG tablet Take 81 mg by mouth daily.  . clopidogrel (PLAVIX) 75 MG tablet   . DEXILANT 60 MG capsule TAKE ONE (1) CAPSULE EACH DAY.  Marland Kitchen escitalopram (LEXAPRO) 10 MG tablet Take by mouth.  . fexofenadine (ALLEGRA) 180 MG tablet Take 180 mg by mouth daily.  . fluticasone (FLONASE) 50 MCG/ACT nasal spray   . Garlic 9379 MG CAPS Take by mouth.  . Multiple Vitamin (MULTIVITAMIN) capsule Take 1 capsule by mouth daily.  . polyethylene glycol powder (GLYCOLAX/MIRALAX) powder Take 255 g (1 Container total) by mouth once.  . vitamin B-12 (CYANOCOBALAMIN) 500 MCG tablet Take 500 mcg by mouth daily.  . Vitamin D, Cholecalciferol, 1000 UNITS TABS Take by mouth.  . vitamin E (VITAMIN E) 400 UNIT capsule Take 400 Units by mouth daily.   No current facility-administered medications on file prior to visit.   Allergies  Allergen Reactions  . Atorvastatin   . Minocycline Hcl Itching  . Niacin   . Penicillins Itching  . Septra [Sulfamethoxazole-Trimethoprim]   . Vioxx [Rofecoxib]    Past Surgical History  Procedure Laterality Date  . Abdominal hysterectomy    . Bladder repair    . Breast biopsy    . Cholecystectomy    . Colonoscopy  2005  . Mr  mra carotid  02/2010    Minimal plaque formation; no significant stenosis. Sx: Syncope  . Nasal sinus surgery      [facility]   History   Social History  . Marital Status: Married    Spouse Name: N/A  . Number of Children: N/A  . Years of Education: N/A   Occupational History  . Not on file.   Social History Main Topics  . Smoking status: Never Smoker   . Smokeless tobacco: Never Used  . Alcohol Use: No  . Drug Use: No  . Sexual Activity: Not on file   Other Topics Concern  . Not on file    Social History Narrative   Family History  Problem Relation Age of Onset  . Cancer Father   . Prostate cancer Father   . Heart attack Mother   . Hypertension Mother   . CAD Mother   . Brain cancer Daughter   . Hypertension Son   . Diabetes Son   . Lung cancer Brother   . Leukemia Brother   . Prostate cancer Brother        Objective:   Physical Exam  Constitutional: She is oriented to person, place, and time. She appears well-developed and well-nourished.  Neurological: She is alert and oriented to person, place, and time.  Psychiatric: She has a normal mood and affect. Her behavior is normal. Judgment normal.    BP 138/86 mmHg  Pulse 64  Temp(Src) 97.7 F (36.5 C) (Oral)  Resp 16  Ht $R'5\' 4"'yK$  (1.626 m)  Wt 170 lb (77.111 kg)  BMI 29.17 kg/m2       Assessment & Plan:  1. Clinical depression Stable on current medication.  Will call if gets worse.  2. Benign hypertension Stable. Current plan of care.  Recheck at  [facility].   3. Type 2 diabetes mellitus without complication Recheck after husband passes.   Gave samples of Dexilant today and refilled Dexilant and Plavix at pharmacy.    Margarita Rana, MD

## 2014-09-28 ENCOUNTER — Ambulatory Visit (INDEPENDENT_AMBULATORY_CARE_PROVIDER_SITE_OTHER): Payer: Medicare Other | Admitting: Family Medicine

## 2014-09-28 ENCOUNTER — Encounter: Payer: Self-pay | Admitting: Family Medicine

## 2014-09-28 ENCOUNTER — Ambulatory Visit: Payer: Self-pay | Admitting: Family Medicine

## 2014-09-28 VITALS — BP 132/82 | HR 58 | Temp 97.9°F | Resp 16 | Wt 171.0 lb

## 2014-09-28 DIAGNOSIS — N3090 Cystitis, unspecified without hematuria: Secondary | ICD-10-CM

## 2014-09-28 LAB — POCT URINALYSIS DIPSTICK
Bilirubin, UA: NEGATIVE
Glucose, UA: NEGATIVE
Ketones, UA: NEGATIVE
NITRITE UA: POSITIVE
PH UA: 6
Protein, UA: NEGATIVE
SPEC GRAV UA: 1.02
UROBILINOGEN UA: 0.2

## 2014-09-28 MED ORDER — CIPROFLOXACIN HCL 500 MG PO TABS: 500.0000 mg | ORAL_TABLET | Freq: Two times a day (BID) | ORAL | Status: DC

## 2014-09-28 NOTE — Progress Notes (Signed)
Patient ID: Norma Johns, female   DOB: 06-Jun-1940, 74 y.o.   MRN: [id number]       Patient: Norma Johns Female    DOB: [birth year]   74 y.o.   MRN: [id number] Visit Date: 09/28/2014  Today's Provider: Margarita Rana, MD   Chief Complaint  Patient presents with  . Urinary Tract Infection    last Thursday, feeling a little burning, but feel pressure and uncomfortable feeling" pt stated   Subjective:    Urinary Tract Infection  This is a recurrent problem. Episode onset: Last Thursday. The quality of the pain is described as burning (pressure and uncomfortable). The pain is at a severity of 7/10 (uncomfortable). There has been no fever. Associated symptoms include flank pain, frequency, hesitancy and urgency. Pertinent negatives include no chills, nausea, sweats or vomiting. Treatments tried: azo OT. The treatment provided no relief.      Allergies  Allergen Reactions  . Atorvastatin   . Minocycline Hcl Itching  . Niacin   . Penicillins Itching  . Septra [Sulfamethoxazole-Trimethoprim]   . Vioxx [Rofecoxib]    Previous Medications   ALBUTEROL (PROVENTIL) (2.5 MG/3ML) 0.083% NEBULIZER SOLUTION    Take 2.5 mg by nebulization every 6 (six) hours as needed for wheezing or shortness of breath.   ALPRAZOLAM (XANAX) 0.5 MG TABLET    Take by mouth.   ASPIRIN 81 MG TABLET    Take 81 mg by mouth daily.   CLOPIDOGREL (PLAVIX) 75 MG TABLET    Take 1 tablet (75 mg total) by mouth daily.   DEXLANSOPRAZOLE (DEXILANT) 60 MG CAPSULE    Take 1 capsule (60 mg total) by mouth daily.   ESCITALOPRAM (LEXAPRO) 10 MG TABLET    Take by mouth.   FEXOFENADINE (ALLEGRA) 180 MG TABLET    Take 180 mg by mouth daily.   FLUTICASONE (FLONASE) 50 MCG/ACT NASAL SPRAY       GARLIC 7829 MG CAPS    Take by mouth.   MULTIPLE VITAMIN (MULTIVITAMIN) CAPSULE    Take 1 capsule by mouth daily.   POLYETHYLENE GLYCOL POWDER (GLYCOLAX/MIRALAX) POWDER    Take 255 g (1 Container total) by mouth once.   VITAMIN B-12  (CYANOCOBALAMIN) 500 MCG TABLET    Take 500 mcg by mouth daily.   VITAMIN D, CHOLECALCIFEROL, 1000 UNITS TABS    Take by mouth.   VITAMIN E (VITAMIN E) 400 UNIT CAPSULE    Take 400 Units by mouth daily.    Review of Systems  Constitutional: Negative for chills.  Gastrointestinal: Negative for nausea and vomiting.  Genitourinary: Positive for hesitancy, urgency, frequency and flank pain.    History  Substance Use Topics  . Smoking status: Never Smoker   . Smokeless tobacco: Never Used  . Alcohol Use: No   Objective:   BP 132/82 mmHg  Pulse 58  Temp(Src) 97.9 F (36.6 C) (Oral)  Resp 16  Wt 171 lb (77.565 kg)  Physical Exam  Constitutional: She appears well-developed and well-nourished.  Cardiovascular: Normal rate and regular rhythm.   Pulmonary/Chest: Effort normal and breath sounds normal.  Abdominal: Soft. There is tenderness in the suprapubic area.  Neurological: She is alert.      Assessment & Plan:     1. Cystitis Condition is worsening. Will start medication for better control.  Will send for culture. Call if worsens or does not improve.   - Urine culture - POCT urinalysis dipstick - ciprofloxacin (CIPRO) 500 MG tablet; Take 1 tablet (500 mg  total) by mouth 2 (two) times daily.  Dispense: 14 tablet; Refill: 0       Margarita Rana, MD  [facility]

## 2014-09-30 LAB — URINE CULTURE

## 2014-10-19 ENCOUNTER — Other Ambulatory Visit: Payer: Self-pay | Admitting: Family Medicine

## 2014-10-19 MED ORDER — LEVOFLOXACIN 500 MG PO TABS: 500.0000 mg | ORAL_TABLET | Freq: Every day | ORAL | Status: DC

## 2014-10-19 NOTE — Telephone Encounter (Signed)
Pt advised.   Thanks,   -Esten Dollar  

## 2014-10-19 NOTE — Telephone Encounter (Signed)
Pt called states she has sinus congestion, headache and  sore throat.  Pt states her husband is in the hospital and can not come in right now.  Pt is requesting a Rx sent in if possible.  [facility].  HQ#[phone]

## 2014-10-19 NOTE — Telephone Encounter (Signed)
Sent in rx. Please let her know to call if she needs anything else. Thanks.

## 2014-11-12 ENCOUNTER — Other Ambulatory Visit: Payer: Self-pay | Admitting: Family Medicine

## 2014-11-12 NOTE — Telephone Encounter (Signed)
Pt is asking for samples of dexlansoprazole (DEXILANT) 60 MG capsule. Thanks TNP

## 2014-11-12 NOTE — Telephone Encounter (Signed)
Ok to leave if available. Thanks.  

## 2014-11-15 NOTE — Telephone Encounter (Signed)
Samples at the front desk; pt advised.   Thanks,   -Mickel Baas

## 2015-01-11 ENCOUNTER — Telehealth: Payer: Self-pay | Admitting: Family Medicine

## 2015-01-11 DIAGNOSIS — E7800 Pure hypercholesterolemia, unspecified: Secondary | ICD-10-CM

## 2015-01-11 DIAGNOSIS — E119 Type 2 diabetes mellitus without complications: Secondary | ICD-10-CM

## 2015-01-11 DIAGNOSIS — R7309 Other abnormal glucose: Secondary | ICD-10-CM

## 2015-01-11 DIAGNOSIS — I10 Essential (primary) hypertension: Secondary | ICD-10-CM

## 2015-01-11 NOTE — Telephone Encounter (Signed)
Lab sheet printed; pt advised.  Thanks,   -Mickel Baas

## 2015-01-11 NOTE — Telephone Encounter (Signed)
Pt stated that she is coming in for her CPE next week and she was told at her last visit to call the week before her CPE to get a lab slip. Pt stated she needs a call when the slip is ready b/c she has to make arrangements to get here since her husband is in Hospice. Thanks TNP

## 2015-01-11 NOTE — Telephone Encounter (Signed)
Please check and see what labs patient needs. Thanks.

## 2015-01-14 ENCOUNTER — Other Ambulatory Visit
Admission: RE | Admit: 2015-01-14 | Discharge: 2015-01-14 | Disposition: A | Discharge status: Home / Self Care | Payer: Medicare Other | Source: Ambulatory Visit | Attending: Family Medicine | Admitting: Family Medicine

## 2015-01-14 DIAGNOSIS — E7800 Pure hypercholesterolemia, unspecified: Secondary | ICD-10-CM | POA: Insufficient documentation

## 2015-01-14 DIAGNOSIS — I10 Essential (primary) hypertension: Secondary | ICD-10-CM | POA: Insufficient documentation

## 2015-01-14 DIAGNOSIS — E119 Type 2 diabetes mellitus without complications: Secondary | ICD-10-CM | POA: Diagnosis not present

## 2015-01-14 LAB — LIPID PANEL
CHOL/HDL RATIO: 4 ratio
CHOLESTEROL: 230 mg/dL — AB (ref 0–200)
HDL: 57 mg/dL (ref >40)
LDL CALC: 155 mg/dL — AB (ref 0–99)
Triglycerides: 88 mg/dL (ref <150)
VLDL: 18 mg/dL (ref 0–40)

## 2015-01-14 LAB — COMPREHENSIVE METABOLIC PANEL
ALBUMIN: 4.2 g/dL (ref 3.5–5.0)
ALK PHOS: 81 U/L (ref 38–126)
ALT: 12 U/L — AB (ref 14–54)
AST: 16 U/L (ref 15–41)
Anion gap: 4 — ABNORMAL LOW (ref 5–15)
BUN: 11 mg/dL (ref 6–20)
CALCIUM: 10 mg/dL (ref 8.9–10.3)
CHLORIDE: 107 mmol/L (ref 101–111)
CO2: 28 mmol/L (ref 22–32)
CREATININE: 0.57 mg/dL (ref 0.44–1.00)
GFR calc non Af Amer: >60 mL/min (ref >60)
GLUCOSE: 113 mg/dL — AB (ref 65–99)
Potassium: 4.8 mmol/L (ref 3.5–5.1)
SODIUM: 139 mmol/L (ref 135–145)
Total Bilirubin: 0.6 mg/dL (ref 0.3–1.2)
Total Protein: 7 g/dL (ref 6.5–8.1)

## 2015-01-14 LAB — CBC WITH DIFFERENTIAL/PLATELET
BASOS ABS: 0 10*3/uL (ref 0–0.1)
BASOS PCT: 1 %
EOS ABS: 0.1 10*3/uL (ref 0–0.7)
EOS PCT: 1 %
HCT: 40.4 % (ref 35.0–47.0)
HEMOGLOBIN: 13.4 g/dL (ref 12.0–16.0)
LYMPHS ABS: 2.3 10*3/uL (ref 1.0–3.6)
Lymphocytes Relative: 32 %
MCH: 29.9 pg (ref 26.0–34.0)
MCHC: 33.1 g/dL (ref 32.0–36.0)
MCV: 90.5 fL (ref 80.0–100.0)
Monocytes Absolute: 0.5 10*3/uL (ref 0.2–0.9)
Monocytes Relative: 7 %
NEUTROS PCT: 59 %
Neutro Abs: 4.2 10*3/uL (ref 1.4–6.5)
PLATELETS: 258 10*3/uL (ref 150–440)
RBC: 4.47 MIL/uL (ref 3.80–5.20)
RDW: 14.1 % (ref 11.5–14.5)
WBC: 7.2 10*3/uL (ref 3.6–11.0)

## 2015-01-14 LAB — TSH: TSH: 0.671 u[IU]/mL (ref 0.350–4.500)

## 2015-01-14 LAB — HEMOGLOBIN A1C: Hgb A1c MFr Bld: 6 % (ref 4.0–6.0)

## 2015-01-17 ENCOUNTER — Telehealth: Payer: Self-pay

## 2015-01-17 NOTE — Telephone Encounter (Signed)
-----   Message from Margarita Rana, MD sent at 01/15/2015  9:06 AM EST ----- Labs have improved.   Blood sugar not at upper edge of normal, not in diabetic range. Recheck in 6 months. Cholesterol is still elevated and medication is recommended. Please see if patient would like to retry a medication,  Know she has had trouble in the past and nothing new has come out.  Know she probably will not. Thanks.

## 2015-01-17 NOTE — Telephone Encounter (Signed)
Patient advised as directed below. Patient voiced understanding. About the Cholesterol medicine patient stated will talk with doctor Venia Minks tomorrow at her wellness.  Thanks,  -Kolter Reaver

## 2015-01-18 ENCOUNTER — Encounter: Payer: Self-pay | Admitting: Family Medicine

## 2015-01-18 ENCOUNTER — Ambulatory Visit (INDEPENDENT_AMBULATORY_CARE_PROVIDER_SITE_OTHER): Payer: Medicare Other | Admitting: Family Medicine

## 2015-01-18 VITALS — BP 126/62 | HR 64 | Temp 98.7°F | Resp 16 | Ht 64.0 in | Wt 169.0 lb

## 2015-01-18 DIAGNOSIS — Z23 Encounter for immunization: Secondary | ICD-10-CM

## 2015-01-18 DIAGNOSIS — R350 Frequency of micturition: Secondary | ICD-10-CM

## 2015-01-18 DIAGNOSIS — Z0000 Encounter for general adult medical examination without abnormal findings: Secondary | ICD-10-CM | POA: Diagnosis not present

## 2015-01-18 LAB — POCT URINALYSIS DIPSTICK
BILIRUBIN UA: NEGATIVE
Glucose, UA: NEGATIVE
KETONES UA: NEGATIVE
LEUKOCYTES UA: NEGATIVE
Nitrite, UA: NEGATIVE
PH UA: 6
Protein, UA: NEGATIVE
Spec Grav, UA: 1.025
Urobilinogen, UA: 0.2

## 2015-01-18 MED ORDER — CIPROFLOXACIN HCL 250 MG PO TABS: 250.0000 mg | ORAL_TABLET | Freq: Two times a day (BID) | ORAL | Status: DC

## 2015-01-18 NOTE — Progress Notes (Signed)
Patient ID: Norma Johns, female   DOB: Jan 22, 1941, 74 y.o.   MRN: [id number]       Patient: Norma Johns, Female    DOB: [birth year], 74 y.o.   MRN: [id number] Visit Date: 01/18/2015  Today's Provider: Margarita Rana, MD   Chief Complaint  Patient presents with  . Annual Exam  . Urinary Tract Infection    symptoms X 2-3 days.    Subjective:    Annual wellness visit Norma Johns is a 74 y.o. female. She feels well. She reports exercising not regularly. She reports she is sleeping not well. She reports that since her husband's illness, she only gets about 2-3 hours of sleep nightly.  Last: Mammogram- 07/22/2013. Normal.  Colonoscopy- 08/26/2013. Normal.  Tdap-   PPSV23- 12/10/2011  Zoster-   EKG-12/31/2012 ----------------------------------------------------------- Urinary Frequency Patient also c/o urinating frequently for the last 2-3 days with some burning discomfort. Patient denies any hematuria. She denies any lower back or abdominal pain. She has not been taking anything OTC for symptoms.  Has had similar symptoms in the past.    Review of Systems  Constitutional: Positive for activity change and fatigue.  HENT: Negative.   Eyes: Negative.   Respiratory: Positive for cough.   Cardiovascular: Negative.   Gastrointestinal: Negative.   Endocrine: Negative.   Genitourinary: Positive for dysuria and frequency.  Musculoskeletal: Negative.   Skin: Negative.   Allergic/Immunologic: Negative.   Neurological: Negative.   Hematological: Negative.   Psychiatric/Behavioral: Negative.     Social History   Social History  . Marital Status: Married    Spouse Name: N/A  . Number of Children: 2  . Years of Education: N/A   Occupational History  . Not on file.   Social History Main Topics  . Smoking status: Never Smoker   . Smokeless tobacco: Never Used  . Alcohol Use: No  . Drug Use: No  . Sexual Activity: Not on file   Other Topics Concern  . Not on file    Social History Narrative    Patient Active Problem List   Diagnosis Date Noted  . Anxiety 08/24/2014  . Atrophic vaginitis 08/24/2014  . Body mass index (BMI) of 29.0-29.9 in adult 08/24/2014  . Cyst of nasal sinus 08/24/2014  . Degenerative joint disease 08/24/2014  . OP (osteoporosis) 08/24/2014  . Bulging eyes 08/24/2014  . GERD (gastroesophageal reflux disease) 08/16/2014  . Encounter for screening colonoscopy 07/31/2013  . Calcium blood increased 01/26/2009  . History of colon polyps 09/21/2008  . Chronic recurrent sinusitis 09/17/2008  . Diabetes mellitus, type 2 ([place]) 08/03/2008  . Abnormal blood sugar 06/22/2008  . Hypercholesteremia 06/22/2008  . Cannot sleep 03/23/2008  . Allergic rhinitis 01/08/2008  . Airway hyperreactivity 01/08/2008  . Carotid artery obstruction 01/08/2008  . Clinical depression 01/08/2008  . Acid reflux 01/08/2008  . Benign hypertension 01/08/2008    Past Surgical History  Procedure Laterality Date  . Abdominal hysterectomy    . Bladder repair    . Breast biopsy    . Cholecystectomy    . Colonoscopy  2005  . Mr Jodene Nam carotid  02/2010    Minimal plaque formation; no significant stenosis. Sx: Syncope  . Nasal sinus surgery      [facility]    Her family history includes Brain cancer in her daughter; CAD in her mother; Cancer in her father; Diabetes in her son; Heart attack in her mother; Hypertension in her mother and son; Leukemia in her brother; Lung  cancer in her brother; Prostate cancer in her brother and father.    Previous Medications   ALBUTEROL (PROVENTIL) (2.5 MG/3ML) 0.083% NEBULIZER SOLUTION    Take 2.5 mg by nebulization every 6 (six) hours as needed for wheezing or shortness of breath.   ALPRAZOLAM (XANAX) 0.5 MG TABLET    Take by mouth.   ASPIRIN 81 MG TABLET    Take 81 mg by mouth daily.   CLOPIDOGREL (PLAVIX) 75 MG TABLET    Take 1 tablet (75 mg total) by mouth daily.   DEXLANSOPRAZOLE (DEXILANT) 60 MG CAPSULE    Take 1  capsule (60 mg total) by mouth daily.   ESCITALOPRAM (LEXAPRO) 10 MG TABLET    Take by mouth.   FEXOFENADINE (ALLEGRA) 180 MG TABLET    Take 180 mg by mouth daily.   FLUTICASONE (FLONASE) 50 MCG/ACT NASAL SPRAY       GARLIC 123XX123 MG CAPS    Take by mouth.   LEVOFLOXACIN (LEVAQUIN) 500 MG TABLET    Take 1 tablet (500 mg total) by mouth daily.   MULTIPLE VITAMIN (MULTIVITAMIN) CAPSULE    Take 1 capsule by mouth daily.   POLYETHYLENE GLYCOL POWDER (GLYCOLAX/MIRALAX) POWDER    Take 255 g (1 Container total) by mouth once.   VITAMIN B-12 (CYANOCOBALAMIN) 500 MCG TABLET    Take 500 mcg by mouth daily.   VITAMIN D, CHOLECALCIFEROL, 1000 UNITS TABS    Take by mouth.   VITAMIN E (VITAMIN E) 400 UNIT CAPSULE    Take 400 Units by mouth daily.    Patient Care Team: Margarita Rana, MD as PCP - General (Family Medicine) Margarita Rana, MD as Referring Physician (Family Medicine) Robert Bellow, MD (General Surgery)     Objective:   Vitals: BP 126/62 mmHg  Pulse 64  Temp(Src) 98.7 F (37.1 C)  Resp 16  Ht 5\' 4"  (1.626 m)  Wt 169 lb (76.658 kg)  BMI 28.99 kg/m2  Physical Exam  Constitutional: She is oriented to person, place, and time. She appears well-developed and well-nourished.  HENT:  Head: Normocephalic and atraumatic.  Right Ear: External ear normal.  Left Ear: External ear normal.  Nose: Mucosal edema and rhinorrhea present.  Mouth/Throat: Oropharynx is clear and moist.  Eyes: EOM are normal. Pupils are equal, round, and reactive to light.  Neck: Normal range of motion. Neck supple.  Cardiovascular: Normal rate, regular rhythm, normal heart sounds and intact distal pulses.   Pulmonary/Chest: Effort normal and breath sounds normal.  Abdominal: Soft. Bowel sounds are normal.  Musculoskeletal: Normal range of motion.  Neurological: She is alert and oriented to person, place, and time. She has normal reflexes.  Skin: Skin is warm and dry.  Psychiatric: She has a normal mood and  affect. Her behavior is normal. Judgment and thought content normal.  Nursing note and vitals reviewed.   Activities of Daily Living In your present state of health, do you have any difficulty performing the following activities: 01/18/2015 09/06/2014  Hearing? N Y  Vision? N N  Difficulty concentrating or making decisions? N Y  Walking or climbing stairs? N Y  Dressing or bathing? N N  Doing errands, shopping? N N    Fall Risk Assessment Fall Risk  01/18/2015 09/06/2014  Falls in the past year? No No     Depression Screen PHQ 2/9 Scores 01/18/2015 09/06/2014  PHQ - 2 Score 0 0    Cognitive Testing - 6-CIT  Correct? Score   What year is  it? yes 0 0 or 4  What month is it? yes 0 0 or 3  Memorize:    Pia Mau,  [birth year],  [address]  [place],      What time is it? (within 1 hour) yes 0 0 or 3  Count backwards from 20 yes 0 0, 2, or 4  Name the months of the year yes 0 0, 2, or 4  Repeat name & address above no 3 0, 2, 4, 6, 8, or 10       TOTAL SCORE  3/28   Interpretation:  Normal  Normal (0-7) Abnormal (8-28)       Assessment & Plan:     Annual Wellness Visit  Reviewed patient's Family Medical History Reviewed and updated list of patient's medical providers Assessment of cognitive impairment was done Assessed patient's functional ability Established a written schedule for health screening [place] Completed and Reviewed   Immunization History  Administered Date(s) Administered  . Pneumococcal Conjugate-13 01/18/2015  . Pneumococcal Polysaccharide-23 12/10/2011    2. Urinary frequency New problem. Treat with Cipro as below. F/U pending urine culture. Instructed patient on nonpharmaclogical management. Patient will call if not improving.   - POCT Urinalysis Dipstick - ciprofloxacin (CIPRO) 250 MG tablet; Take 1 tablet (250 mg total) by mouth 2 (two) times daily.  Dispense: 14 tablet; Refill: 0 - Urinalysis, microscopic only - Urine  Culture Results for orders placed or performed in visit on 01/18/15  POCT Urinalysis Dipstick  Result Value Ref Range   Color, UA yellow    Clarity, UA clear    Glucose, UA negative    Bilirubin, UA negative    Ketones, UA negative    Spec Grav, UA 1.025    Blood, UA hemolyzed large    pH, UA 6.0    Protein, UA negative    Urobilinogen, UA 0.2    Nitrite, UA negative    Leukocytes, UA Negative Negative    3. Need for pneumococcal vaccination - Pneumococcal conjugate vaccine 13-valent    Discussed health benefits of physical activity, and encouraged her to engage in regular exercise appropriate for her age and condition.   Patient was seen and examined by Jerrell Belfast, MD, and scribed by Wilburt Finlay, [place].  I have reviewed the document for accuracy and completeness and I agree with above. Jerrell Belfast, MD   Margarita Rana, MD    ------------------------------------------------------------------------------------------------------------

## 2015-01-19 LAB — URINALYSIS, MICROSCOPIC ONLY: Casts: NONE SEEN /lpf

## 2015-01-19 LAB — PLEASE NOTE

## 2015-01-21 LAB — URINE CULTURE: ORGANISM ID, BACTERIA: NO GROWTH

## 2015-01-24 ENCOUNTER — Telehealth: Payer: Self-pay

## 2015-01-24 MED ORDER — LEVOFLOXACIN 500 MG PO TABS: 500.0000 mg | ORAL_TABLET | Freq: Every day | ORAL | Status: DC

## 2015-01-24 NOTE — Telephone Encounter (Signed)
Talked with patient. Will only take if worsens.  Levaquin usually the only thing that gets it better. Has continually worsened. Thanks.

## 2015-01-24 NOTE — Telephone Encounter (Signed)
Pt advised as directed below.  She reports her urinary symptoms have improved, but now she has a bad sinus infection.  She is still on Cipro and it wanting to know if Cipro will help or does she need a different antibiotic.  She says she has a lot of drainage (with color), sinus pain/pressure, and ran a fever on Saturday of 101.  Pt uses [facility].   Thanks,   -Mickel Baas

## 2015-01-24 NOTE — Telephone Encounter (Signed)
-----   Message from Margarita Rana, MD sent at 01/21/2015  8:09 PM EST ----- Urine with no growth.  Please see how patient is doing. Thanks.

## 2015-02-10 ENCOUNTER — Telehealth: Payer: Self-pay

## 2015-02-10 MED ORDER — AZITHROMYCIN 250 MG PO TABS: ORAL_TABLET | ORAL | Status: DC

## 2015-02-10 NOTE — Telephone Encounter (Signed)
Patient reports that she has had head congestion, sinus pressure, and PND for the past 2 days. Denies fever. Patient also reports that she woke up with a sore throat this morning. Patient is requesting that something be called into the pharmacy. Patient uses [name] in [place]. Contact number is [phone].

## 2015-02-10 NOTE — Telephone Encounter (Signed)
Does she need antibiotic for sinus. Thanks.

## 2015-02-10 NOTE — Telephone Encounter (Signed)
Yes she thinks this will help.

## 2015-03-09 ENCOUNTER — Other Ambulatory Visit: Payer: Self-pay | Admitting: Family Medicine

## 2015-03-09 DIAGNOSIS — K219 Gastro-esophageal reflux disease without esophagitis: Secondary | ICD-10-CM

## 2015-03-09 NOTE — Telephone Encounter (Signed)
Ok to leave sample. Thanks.

## 2015-03-09 NOTE — Telephone Encounter (Signed)
Samples at front desk for pickup. Patient is aware.

## 2015-03-09 NOTE — Telephone Encounter (Signed)
Patient is calling to see if you have any Dexilant 60mg  samples. Please call [phone] and let patient know if there is any samples. KB

## 2015-03-09 NOTE — Telephone Encounter (Signed)
Is it okay to leave samples?  Thanks,    -Laura  

## 2015-04-07 ENCOUNTER — Other Ambulatory Visit: Payer: Self-pay | Admitting: Family Medicine

## 2015-04-07 MED ORDER — ALPRAZOLAM 0.5 MG PO TABS: 0.5000 mg | ORAL_TABLET | Freq: Two times a day (BID) | ORAL | Status: DC | PRN

## 2015-04-07 NOTE — Telephone Encounter (Signed)
Patient is currently at [facility] with her husband and he is not doing well at all. She is asking for a refill on her ALPRAZolam (XANAX) 0.5 MG tablet. Please call if you have any questions. [phone].

## 2015-04-07 NOTE — Telephone Encounter (Signed)
Printed. Please call in rx and tell Ulis Rias to let us know if there is anything else we can do. Thanks.

## 2015-04-07 NOTE — Telephone Encounter (Signed)
RX called in.   Thanks,   -Cadan Maggart  

## 2015-04-13 ENCOUNTER — Other Ambulatory Visit: Payer: Self-pay | Admitting: Family Medicine

## 2015-04-13 MED ORDER — AZITHROMYCIN 250 MG PO TABS: ORAL_TABLET | ORAL | Status: DC

## 2015-04-13 NOTE — Telephone Encounter (Signed)
Pt stated that she has the same symptoms she had in December and would like to get a refill for azithromycin (ZITHROMAX) 250 MG tablet because it helped in December. Pt would like it sent to [facility]. Pt stated it is hard for her to get in the office b/c she has to be with her husband. Thanks TNP

## 2015-04-13 NOTE — Telephone Encounter (Signed)
Pt advised. sd 

## 2015-04-13 NOTE — Telephone Encounter (Signed)
Sent rx. Thanks.  

## 2015-04-13 NOTE — Telephone Encounter (Signed)
NA. sd 

## 2015-04-18 ENCOUNTER — Telehealth: Payer: Self-pay | Admitting: Family Medicine

## 2015-04-18 NOTE — Telephone Encounter (Signed)
Ok to leave samples if available. Thanks.   

## 2015-04-18 NOTE — Telephone Encounter (Signed)
Patient is calling requesting samples of dexlansoprazole (DEXILANT) 60 MG capsule. Please advise patient if any samples is available. [place]

## 2015-04-19 NOTE — Telephone Encounter (Signed)
Samples left; pt advised.  Thanks,   -Mickel Baas

## 2015-06-13 ENCOUNTER — Encounter: Payer: Self-pay | Admitting: Family Medicine

## 2015-06-13 ENCOUNTER — Ambulatory Visit (INDEPENDENT_AMBULATORY_CARE_PROVIDER_SITE_OTHER): Payer: Medicare Other | Admitting: Family Medicine

## 2015-06-13 VITALS — BP 126/74 | HR 64 | Temp 97.9°F | Resp 16 | Wt 173.0 lb

## 2015-06-13 DIAGNOSIS — E119 Type 2 diabetes mellitus without complications: Secondary | ICD-10-CM

## 2015-06-13 DIAGNOSIS — M545 Low back pain, unspecified: Secondary | ICD-10-CM | POA: Insufficient documentation

## 2015-06-13 DIAGNOSIS — F419 Anxiety disorder, unspecified: Secondary | ICD-10-CM

## 2015-06-13 DIAGNOSIS — E7800 Pure hypercholesterolemia, unspecified: Secondary | ICD-10-CM | POA: Diagnosis not present

## 2015-06-13 DIAGNOSIS — R252 Cramp and spasm: Secondary | ICD-10-CM

## 2015-06-13 DIAGNOSIS — I10 Essential (primary) hypertension: Secondary | ICD-10-CM | POA: Diagnosis not present

## 2015-06-13 MED ORDER — MELOXICAM 7.5 MG PO TABS: 7.5000 mg | ORAL_TABLET | Freq: Two times a day (BID) | ORAL | Status: DC

## 2015-06-13 MED ORDER — ESCITALOPRAM OXALATE 10 MG PO TABS: 10.0000 mg | ORAL_TABLET | Freq: Every day | ORAL | Status: DC

## 2015-06-13 NOTE — Progress Notes (Signed)
Patient ID: Norma Johns, female   DOB: 1940/11/21, 75 y.o.   MRN: [id number]         Patient: Norma Johns Female    DOB: 12-24-1940   75 y.o.   MRN: [id number] Visit Date: 06/13/2015  Today's Provider: Margarita Rana, MD   Chief Complaint  Patient presents with  . Back Pain  . Arm Pain  . Anxiety   Subjective:    Back Pain This is a chronic problem. The problem has been gradually worsening since onset. The pain is present in the lumbar spine. The quality of the pain is described as stabbing. The pain does not radiate. The pain is at a severity of 3/10 (Can be as high as 10/10). The pain is the same all the time. The symptoms are aggravated by position (Lifting). Stiffness is present all day. Associated symptoms include bladder incontinence (Chronic issue). Pertinent negatives include no abdominal pain, bowel incontinence, chest pain, fever, headaches, numbness, pelvic pain, tingling or weakness.  Anxiety Presents for follow-up visit. The problem has been gradually worsening. Symptoms include decreased concentration, excessive worry, insomnia, malaise and nervous/anxious behavior. Patient reports no chest pain, confusion, dizziness, impotence, nausea, palpitations, panic, shortness of breath or suicidal ideas.   Risk factors include family history.       Allergies  Allergen Reactions  . Atorvastatin   . Minocycline Hcl Itching  . Niacin   . Penicillins Itching  . Septra [Sulfamethoxazole-Trimethoprim]   . Vioxx [Rofecoxib]    Previous Medications   ALBUTEROL (PROVENTIL) (2.5 MG/3ML) 0.083% NEBULIZER SOLUTION    Take 2.5 mg by nebulization every 6 (six) hours as needed for wheezing or shortness of breath.   ALPRAZOLAM (XANAX) 0.5 MG TABLET    Take 1 tablet (0.5 mg total) by mouth 2 (two) times daily as needed for anxiety.   ASPIRIN 81 MG TABLET    Take 81 mg by mouth daily.   CLOPIDOGREL (PLAVIX) 75 MG TABLET    Take 1 tablet (75 mg total) by mouth daily.   DEXLANSOPRAZOLE (DEXILANT) 60 MG CAPSULE    Take 1 capsule (60 mg total) by mouth daily.   ESCITALOPRAM (LEXAPRO) 10 MG TABLET    Take by mouth.   FEXOFENADINE (ALLEGRA) 180 MG TABLET    Take 180 mg by mouth daily.   FLUTICASONE (FLONASE) 50 MCG/ACT NASAL SPRAY       GARLIC 123XX123 MG CAPS    Take by mouth.   LEVOFLOXACIN (LEVAQUIN) 500 MG TABLET    Take 1 tablet (500 mg total) by mouth daily.   MULTIPLE VITAMIN (MULTIVITAMIN) CAPSULE    Take 1 capsule by mouth daily.   POLYETHYLENE GLYCOL POWDER (GLYCOLAX/MIRALAX) POWDER    Take 255 g (1 Container total) by mouth once.   VITAMIN B-12 (CYANOCOBALAMIN) 500 MCG TABLET    Take 500 mcg by mouth daily.   VITAMIN D, CHOLECALCIFEROL, 1000 UNITS TABS    Take by mouth.   VITAMIN E (VITAMIN E) 400 UNIT CAPSULE    Take 400 Units by mouth daily.    Review of Systems  Constitutional: Positive for fatigue. Negative for fever, chills, diaphoresis, activity change, appetite change (Increased appetite when stressed out.) and unexpected weight change.  Respiratory: Positive for wheezing (Has asthma). Negative for apnea, cough, choking, chest tightness, shortness of breath and stridor.   Cardiovascular: Positive for leg swelling. Negative for chest pain and palpitations.  Gastrointestinal: Negative for nausea, vomiting, abdominal pain, diarrhea, constipation, blood in stool, abdominal distention, anal  bleeding, rectal pain and bowel incontinence.  Endocrine: Negative for cold intolerance, heat intolerance, polydipsia, polyphagia and polyuria.  Genitourinary: Positive for bladder incontinence (Chronic issue). Negative for impotence and pelvic pain.  Musculoskeletal: Positive for myalgias (Bad muscle cramps.), back pain and arthralgias. Negative for joint swelling, gait problem, neck pain and neck stiffness.  Neurological: Negative for dizziness, tingling, weakness, light-headedness, numbness and headaches.  Psychiatric/Behavioral: Positive for sleep disturbance,  dysphoric mood (Pt gets depressed sometimes. ) and decreased concentration. Negative for suicidal ideas, hallucinations, behavioral problems, confusion, self-injury and agitation. The patient is nervous/anxious and has insomnia. The patient is not hyperactive.     Social History  Substance Use Topics  . Smoking status: Current Every Day Smoker    Types: Cigars  . Smokeless tobacco: Never Used  . Alcohol Use: No   Objective:   BP 126/74 mmHg  Pulse 64  Temp(Src) 97.9 F (36.6 C) (Oral)  Resp 16  Wt 173 lb (78.472 kg)  Physical Exam  Constitutional: She is oriented to person, place, and time. She appears well-developed and well-nourished.  Cardiovascular: Normal rate and regular rhythm.   Pulmonary/Chest: Effort normal and breath sounds normal.  Musculoskeletal: Normal range of motion. She exhibits tenderness (Tender over right SI Joint).  Neurological: She is alert and oriented to person, place, and time.  Skin: Skin is warm and dry.  Psychiatric: She has a normal mood and affect. Her behavior is normal. Judgment and thought content normal.        Assessment & Plan:     1. Anxiety Worsening; will restart Lexapro.   - escitalopram (LEXAPRO) 10 MG tablet; Take 1 tablet (10 mg total) by mouth daily. Reported on 06/13/2015  Dispense: 90 tablet; Refill: 1  2. Bilateral low back pain without sciatica Worsening; pt reports Meloxicam helps with the pain.  Will refill today.  - meloxicam (MOBIC) 7.5 MG tablet; Take 1 tablet (7.5 mg total) by mouth 2 (two) times daily.  Dispense: 180 tablet; Refill: 1  3. Hypercholesteremia Elevated at last check; will recheck today.  - Lipid panel  4. Type 2 diabetes mellitus without complication, without long-term current use of insulin (HCC) Stable; check labs.  - Comprehensive metabolic panel - Hemoglobin A1c  5. Benign hypertension Stable; will check labs.  - CBC with Differential/Platelet - TSH  6. Muscle cramps Worsening; will check  labs.  - Magnesium  Patient was seen and examined by Jerrell Belfast, MD, and note scribed by Ashley Royalty, CMA.  I have reviewed the document for accuracy and completeness and I agree with above. - Jerrell Belfast, MD      Margarita Rana, MD  [facility]

## 2015-06-14 ENCOUNTER — Other Ambulatory Visit: Payer: Self-pay | Admitting: Family Medicine

## 2015-06-14 DIAGNOSIS — F419 Anxiety disorder, unspecified: Secondary | ICD-10-CM

## 2015-06-14 MED ORDER — FLUTICASONE PROPIONATE 50 MCG/ACT NA SUSP: 2.0000 | Freq: Every day | NASAL | Status: DC

## 2015-06-14 MED ORDER — ALPRAZOLAM 0.5 MG PO TABS: 0.5000 mg | ORAL_TABLET | Freq: Two times a day (BID) | ORAL | Status: DC | PRN

## 2015-06-14 NOTE — Telephone Encounter (Signed)
Patient is requesting a refill on her meds. Only two was called into the pharmacy --the meloxicam and lexapro but she is also needing her ALPRAZolam (XANAX) 0.5 MG tablet and fluticasone (FLONASE) 50 MCG/ACT nasal spray Please call if you have any questions. [phone].

## 2015-06-14 NOTE — Telephone Encounter (Signed)
Printed, please fax or call in to pharmacy. Thank you.   

## 2015-06-14 NOTE — Telephone Encounter (Signed)
Med called to pharmacy.

## 2015-06-15 ENCOUNTER — Other Ambulatory Visit
Admission: RE | Admit: 2015-06-15 | Discharge: 2015-06-15 | Disposition: A | Discharge status: Home / Self Care | Payer: Medicare Other | Source: Ambulatory Visit | Attending: Family Medicine | Admitting: Family Medicine

## 2015-06-15 DIAGNOSIS — E119 Type 2 diabetes mellitus without complications: Secondary | ICD-10-CM | POA: Diagnosis not present

## 2015-06-15 DIAGNOSIS — R252 Cramp and spasm: Secondary | ICD-10-CM | POA: Insufficient documentation

## 2015-06-15 DIAGNOSIS — I10 Essential (primary) hypertension: Secondary | ICD-10-CM | POA: Insufficient documentation

## 2015-06-15 LAB — LIPID PANEL
CHOL/HDL RATIO: 4.1 ratio
CHOLESTEROL: 216 mg/dL — AB (ref 0–200)
HDL: 53 mg/dL (ref >40)
LDL Cholesterol: 137 mg/dL — ABNORMAL HIGH (ref 0–99)
Triglycerides: 132 mg/dL (ref <150)
VLDL: 26 mg/dL (ref 0–40)

## 2015-06-15 LAB — COMPREHENSIVE METABOLIC PANEL
ALK PHOS: 76 U/L (ref 38–126)
ALT: 12 U/L — AB (ref 14–54)
AST: 15 U/L (ref 15–41)
Albumin: 4.3 g/dL (ref 3.5–5.0)
Anion gap: 6 (ref 5–15)
BUN: 9 mg/dL (ref 6–20)
CALCIUM: 9.9 mg/dL (ref 8.9–10.3)
CHLORIDE: 108 mmol/L (ref 101–111)
CO2: 27 mmol/L (ref 22–32)
CREATININE: 0.67 mg/dL (ref 0.44–1.00)
GFR calc Af Amer: >60 mL/min (ref >60)
Glucose, Bld: 118 mg/dL — ABNORMAL HIGH (ref 65–99)
Potassium: 4.1 mmol/L (ref 3.5–5.1)
SODIUM: 141 mmol/L (ref 135–145)
Total Bilirubin: 0.8 mg/dL (ref 0.3–1.2)
Total Protein: 6.9 g/dL (ref 6.5–8.1)

## 2015-06-15 LAB — CBC WITH DIFFERENTIAL/PLATELET
Basophils Absolute: 0 10*3/uL (ref 0–0.1)
Basophils Relative: 1 %
EOS ABS: 0.1 10*3/uL (ref 0–0.7)
EOS PCT: 1 %
HCT: 40 % (ref 35.0–47.0)
Hemoglobin: 13.4 g/dL (ref 12.0–16.0)
LYMPHS ABS: 2.1 10*3/uL (ref 1.0–3.6)
Lymphocytes Relative: 35 %
MCH: 30.4 pg (ref 26.0–34.0)
MCHC: 33.6 g/dL (ref 32.0–36.0)
MCV: 90.5 fL (ref 80.0–100.0)
MONOS PCT: 8 %
Monocytes Absolute: 0.5 10*3/uL (ref 0.2–0.9)
Neutro Abs: 3.3 10*3/uL (ref 1.4–6.5)
Neutrophils Relative %: 55 %
PLATELETS: 265 10*3/uL (ref 150–440)
RBC: 4.42 MIL/uL (ref 3.80–5.20)
RDW: 13.7 % (ref 11.5–14.5)
WBC: 6.1 10*3/uL (ref 3.6–11.0)

## 2015-06-15 LAB — TSH: TSH: 0.993 u[IU]/mL (ref 0.350–4.500)

## 2015-06-15 LAB — HEMOGLOBIN A1C: HEMOGLOBIN A1C: 6 % (ref 4.0–6.0)

## 2015-06-15 LAB — MAGNESIUM: Magnesium: 2 mg/dL (ref 1.7–2.4)

## 2015-06-17 ENCOUNTER — Telehealth: Payer: Self-pay

## 2015-06-17 NOTE — Telephone Encounter (Signed)
-----   Message from Margarita Rana, MD sent at 06/17/2015  9:43 AM EDT ----- Labs stable.  Cholesterol is elevated at 216.  10 year risk of heart disease  Is 14 percent. Please see if patient would like to start a low dose statin.  Thanks.

## 2015-06-17 NOTE — Telephone Encounter (Signed)
Advised patient as below. Patient reports that she has tried Lipitor in the past, and it made her muscles ache. She said that she is willing to try another med. Patient uses [name] in [place]. Thanks!

## 2015-06-17 NOTE — Telephone Encounter (Signed)
Advised patient as below.  

## 2015-06-17 NOTE — Telephone Encounter (Signed)
Sent in rx for Pravachol  20 mg daily. Recheck labs in 6 weeks.  Thanks.

## 2015-07-06 DIAGNOSIS — H40033 Anatomical narrow angle, bilateral: Secondary | ICD-10-CM | POA: Diagnosis not present

## 2015-07-29 ENCOUNTER — Encounter: Payer: Self-pay | Admitting: Family Medicine

## 2015-07-29 ENCOUNTER — Ambulatory Visit (INDEPENDENT_AMBULATORY_CARE_PROVIDER_SITE_OTHER): Payer: Medicare Other | Admitting: Family Medicine

## 2015-07-29 VITALS — BP 126/64 | HR 76 | Temp 97.8°F | Resp 16 | Wt 172.0 lb

## 2015-07-29 DIAGNOSIS — J069 Acute upper respiratory infection, unspecified: Secondary | ICD-10-CM | POA: Diagnosis not present

## 2015-07-29 DIAGNOSIS — J0110 Acute frontal sinusitis, unspecified: Secondary | ICD-10-CM

## 2015-07-29 DIAGNOSIS — J309 Allergic rhinitis, unspecified: Secondary | ICD-10-CM

## 2015-07-29 MED ORDER — HYDROCODONE-HOMATROPINE 5-1.5 MG/5ML PO SYRP: 5.0000 mL | ORAL_SOLUTION | Freq: Three times a day (TID) | ORAL | Status: DC | PRN

## 2015-07-29 MED ORDER — FLUTICASONE PROPIONATE 50 MCG/ACT NA SUSP: 2.0000 | Freq: Every day | NASAL | Status: DC

## 2015-07-29 MED ORDER — LEVOFLOXACIN 500 MG PO TABS: 500.0000 mg | ORAL_TABLET | Freq: Every day | ORAL | Status: DC

## 2015-07-29 MED ORDER — ALBUTEROL SULFATE (2.5 MG/3ML) 0.083% IN NEBU: 2.5000 mg | INHALATION_SOLUTION | Freq: Four times a day (QID) | RESPIRATORY_TRACT | Status: DC | PRN

## 2015-07-29 NOTE — Progress Notes (Signed)
Patient ID: Norma Johns, female   DOB: [birth year], 75 y.o.   MRN: [id number]         Patient: Norma Johns Female    DOB: 04-Mar-1940   75 y.o.   MRN: [id number] Visit Date: 07/29/2015  Today's Provider: Margarita Rana, MD   Chief Complaint  Patient presents with  . Sinusitis  . URI   Subjective:    Sinusitis This is a new problem. The current episode started in the past 7 days. The problem has been gradually improving since onset. The maximum temperature recorded prior to her arrival was 100.4 - 100.9 F (Had a fever of 100 on 07/27/2015). The fever has been present for less than 1 day. Associated symptoms include chills, congestion, coughing, ear pain, headaches, sinus pressure, a sore throat and swollen glands. Pertinent negatives include no hoarse voice, neck pain, shortness of breath or sneezing.  URI  This is a new problem. The current episode started in the past 7 days. The problem has been gradually worsening. The maximum temperature recorded prior to her arrival was 100.4 - 100.9 F. The fever has been present for less than 1 day. Associated symptoms include congestion, coughing, ear pain, headaches, a plugged ear sensation, sinus pain, a sore throat, swollen glands and wheezing. Pertinent negatives include no neck pain, rhinorrhea or sneezing.       Allergies  Allergen Reactions  . Atorvastatin   . Minocycline Hcl Itching  . Niacin   . Penicillins Itching  . Septra [Sulfamethoxazole-Trimethoprim]   . Vioxx [Rofecoxib]    Previous Medications   ALPRAZOLAM (XANAX) 0.5 MG TABLET    Take 1 tablet (0.5 mg total) by mouth 2 (two) times daily as needed for anxiety.   CLOPIDOGREL (PLAVIX) 75 MG TABLET    Take 1 tablet (75 mg total) by mouth daily.   DEXLANSOPRAZOLE (DEXILANT) 60 MG CAPSULE    Take 1 capsule (60 mg total) by mouth daily.   ESCITALOPRAM (LEXAPRO) 10 MG TABLET    Take 1 tablet (10 mg total) by mouth daily. Reported on 06/13/2015   FEXOFENADINE (ALLEGRA) 180 MG  TABLET    Take 180 mg by mouth daily.   MULTIPLE VITAMIN (MULTIVITAMIN) CAPSULE    Take 1 capsule by mouth daily.   POLYETHYLENE GLYCOL POWDER (GLYCOLAX/MIRALAX) POWDER    Take 255 g (1 Container total) by mouth once.    Review of Systems  Constitutional: Positive for fever, chills and fatigue. Negative for activity change, appetite change and unexpected weight change.  HENT: Positive for congestion, ear pain, sinus pressure and sore throat. Negative for ear discharge, hoarse voice, mouth sores, nosebleeds, rhinorrhea, sneezing, tinnitus, trouble swallowing and voice change.   Eyes: Positive for pain. Negative for photophobia, discharge, redness, itching and visual disturbance.  Respiratory: Positive for cough and wheezing. Negative for apnea, choking, chest tightness and shortness of breath.   Cardiovascular: Negative.   Gastrointestinal: Negative.   Musculoskeletal: Positive for neck stiffness. Negative for neck pain.  Neurological: Positive for light-headedness and headaches. Negative for dizziness and numbness.    Social History  Substance Use Topics  . Smoking status: Never Smoker   . Smokeless tobacco: Never Used  . Alcohol Use: No   Objective:   BP 126/64 mmHg  Pulse 76  Temp(Src) 97.8 F (36.6 C) (Oral)  Resp 16  Wt 172 lb (78.019 kg)  Physical Exam  Constitutional: She is oriented to person, place, and time. She appears well-developed and well-nourished.  HENT:  Head:  Normocephalic and atraumatic.  Right Ear: Tympanic membrane, external ear and ear canal normal.  Left Ear: Tympanic membrane, external ear and ear canal normal.  Nose: Mucosal edema present. Right sinus exhibits maxillary sinus tenderness and frontal sinus tenderness. Left sinus exhibits maxillary sinus tenderness and frontal sinus tenderness.  Mouth/Throat: Uvula is midline, oropharynx is clear and moist and mucous membranes are normal.  Cardiovascular: Normal rate, regular rhythm and normal heart sounds.    Pulmonary/Chest: Effort normal and breath sounds normal.  Neurological: She is alert and oriented to person, place, and time.  Skin: Skin is warm and dry.  Psychiatric: She has a normal mood and affect. Her behavior is normal. Judgment and thought content normal.        Assessment & Plan:     1. Acute frontal sinusitis, recurrence not specified Worsening; will start Levaquin as directed below.  Pt advised to call if not improved or worsening.   - levofloxacin (LEVAQUIN) 500 MG tablet; Take 1 tablet (500 mg total) by mouth daily.  Dispense: 7 tablet; Refill: 0  2. Upper respiratory infection Treat as above and refill on her albuterol  - albuterol (PROVENTIL) (2.5 MG/3ML) 0.083% nebulizer solution; Take 3 mLs (2.5 mg total) by nebulization every 6 (six) hours as needed for wheezing or shortness of breath. Reported on 06/13/2015  Dispense: 75 mL; Refill: 5  3. Allergic rhinitis, unspecified allergic rhinitis type Refills provided.   - fluticasone (FLONASE) 50 MCG/ACT nasal spray; Place 2 sprays into both nostrils daily.  Dispense: 16 g; Refill: 5   Patient was seen and examined by Jerrell Belfast, MD, and note scribed by Ashley Royalty, CMA.   I have reviewed the document for accuracy and completeness and I agree with above. - Jerrell Belfast, MD     Margarita Rana, MD  [facility]

## 2015-09-12 ENCOUNTER — Other Ambulatory Visit: Payer: Self-pay

## 2015-09-12 DIAGNOSIS — K219 Gastro-esophageal reflux disease without esophagitis: Secondary | ICD-10-CM

## 2015-09-12 MED ORDER — DEXLANSOPRAZOLE 60 MG PO CPDR: 60.0000 mg | DELAYED_RELEASE_CAPSULE | Freq: Every day | ORAL | Status: DC

## 2015-09-22 ENCOUNTER — Other Ambulatory Visit: Payer: Self-pay | Admitting: Physician Assistant

## 2015-09-22 DIAGNOSIS — I6529 Occlusion and stenosis of unspecified carotid artery: Secondary | ICD-10-CM

## 2015-09-22 MED ORDER — CLOPIDOGREL BISULFATE 75 MG PO TABS: 75.0000 mg | ORAL_TABLET | Freq: Every day | ORAL | 5 refills | Status: DC

## 2015-11-21 ENCOUNTER — Other Ambulatory Visit: Payer: Self-pay | Admitting: Family Medicine

## 2015-11-21 DIAGNOSIS — I6529 Occlusion and stenosis of unspecified carotid artery: Secondary | ICD-10-CM

## 2015-11-21 MED ORDER — CLOPIDOGREL BISULFATE 75 MG PO TABS: 75.0000 mg | ORAL_TABLET | Freq: Every day | ORAL | 5 refills | Status: DC

## 2015-11-21 NOTE — Telephone Encounter (Signed)
Pt needs refill on her plavix.  She uses [facility].  Her call back is [phone]  Thanks teri

## 2015-11-23 ENCOUNTER — Ambulatory Visit: Payer: Self-pay | Admitting: Physician Assistant

## 2015-11-24 ENCOUNTER — Encounter: Payer: Self-pay | Admitting: Physician Assistant

## 2015-11-24 ENCOUNTER — Ambulatory Visit (INDEPENDENT_AMBULATORY_CARE_PROVIDER_SITE_OTHER): Payer: Medicare Other | Admitting: Physician Assistant

## 2015-11-24 VITALS — BP 130/72 | HR 68 | Temp 97.9°F | Resp 16 | Wt 175.0 lb

## 2015-11-24 DIAGNOSIS — R221 Localized swelling, mass and lump, neck: Secondary | ICD-10-CM

## 2015-11-24 DIAGNOSIS — J40 Bronchitis, not specified as acute or chronic: Secondary | ICD-10-CM | POA: Diagnosis not present

## 2015-11-24 DIAGNOSIS — J0110 Acute frontal sinusitis, unspecified: Secondary | ICD-10-CM

## 2015-11-24 DIAGNOSIS — J4521 Mild intermittent asthma with (acute) exacerbation: Secondary | ICD-10-CM

## 2015-11-24 DIAGNOSIS — R600 Localized edema: Secondary | ICD-10-CM | POA: Diagnosis not present

## 2015-11-24 MED ORDER — HYDROCODONE-HOMATROPINE 5-1.5 MG/5ML PO SYRP: 5.0000 mL | ORAL_SOLUTION | Freq: Three times a day (TID) | ORAL | 0 refills | Status: DC | PRN

## 2015-11-24 MED ORDER — PREDNISONE 10 MG (21) PO TBPK: ORAL_TABLET | ORAL | 0 refills | Status: DC

## 2015-11-24 MED ORDER — POTASSIUM CHLORIDE CRYS ER 20 MEQ PO TBCR: 20.0000 meq | EXTENDED_RELEASE_TABLET | Freq: Every day | ORAL | 0 refills | Status: DC

## 2015-11-24 MED ORDER — LEVOFLOXACIN 500 MG PO TABS: 500.0000 mg | ORAL_TABLET | Freq: Every day | ORAL | 0 refills | Status: DC

## 2015-11-24 MED ORDER — ALBUTEROL SULFATE HFA 108 (90 BASE) MCG/ACT IN AERS: 2.0000 | INHALATION_SPRAY | RESPIRATORY_TRACT | 11 refills | Status: DC | PRN

## 2015-11-24 MED ORDER — FUROSEMIDE 20 MG PO TABS: 20.0000 mg | ORAL_TABLET | Freq: Every day | ORAL | 0 refills | Status: DC

## 2015-11-24 NOTE — Patient Instructions (Signed)
Sinusitis, Adult Sinusitis is redness, soreness, and inflammation of the paranasal sinuses. Paranasal sinuses are air pockets within the bones of your face. They are located beneath your eyes, in the middle of your forehead, and above your eyes. In healthy paranasal sinuses, mucus is able to drain out, and air is able to circulate through them by way of your nose. However, when your paranasal sinuses are inflamed, mucus and air can become trapped. This can allow bacteria and other germs to grow and cause infection. Sinusitis can develop quickly and last only a short time (acute) or continue over a long period (chronic). Sinusitis that lasts for more than 12 weeks is considered chronic. CAUSES Causes of sinusitis include:  Allergies.  Structural abnormalities, such as displacement of the cartilage that separates your nostrils (deviated septum), which can decrease the air flow through your nose and sinuses and affect sinus drainage.  Functional abnormalities, such as when the small hairs (cilia) that line your sinuses and help remove mucus do not work properly or are not present. SIGNS AND SYMPTOMS Symptoms of acute and chronic sinusitis are the same. The primary symptoms are pain and pressure around the affected sinuses. Other symptoms include:  Upper toothache.  Earache.  Headache.  Bad breath.  Decreased sense of smell and taste.  A cough, which worsens when you are lying flat.  Fatigue.  Fever.  Thick drainage from your nose, which often is green and may contain pus (purulent).  Swelling and warmth over the affected sinuses. DIAGNOSIS Your health care provider will perform a physical exam. During your exam, your health care provider may perform any of the following to help determine if you have acute sinusitis or chronic sinusitis:  Look in your nose for signs of abnormal growths in your nostrils (nasal polyps).  Tap over the affected sinus to check for signs of  infection.  View the inside of your sinuses using an imaging device that has a light attached (endoscope). If your health care provider suspects that you have chronic sinusitis, one or more of the following tests may be recommended:  Allergy tests.  Nasal culture. A sample of mucus is taken from your nose, sent to a lab, and screened for bacteria.  Nasal cytology. A sample of mucus is taken from your nose and examined by your health care provider to determine if your sinusitis is related to an allergy. TREATMENT Most cases of acute sinusitis are related to a viral infection and will resolve on their own within 10 days. Sometimes, medicines are prescribed to help relieve symptoms of both acute and chronic sinusitis. These may include pain medicines, decongestants, nasal steroid sprays, or saline sprays. However, for sinusitis related to a bacterial infection, your health care provider will prescribe antibiotic medicines. These are medicines that will help kill the bacteria causing the infection. Rarely, sinusitis is caused by a fungal infection. In these cases, your health care provider will prescribe antifungal medicine. For some cases of chronic sinusitis, surgery is needed. Generally, these are cases in which sinusitis recurs more than 3 times per year, despite other treatments. HOME CARE INSTRUCTIONS  Drink plenty of water. Water helps thin the mucus so your sinuses can drain more easily.  Use a humidifier.  Inhale steam 3-4 times a day (for example, sit in the bathroom with the shower running).  Apply a warm, moist washcloth to your face 3-4 times a day, or as directed by your health care provider.  Use saline nasal sprays to help   moisten and clean your sinuses.  Take medicines only as directed by your health care provider.  If you were prescribed either an antibiotic or antifungal medicine, finish it all even if you start to feel better. SEEK IMMEDIATE MEDICAL CARE IF:  You have  increasing pain or severe headaches.  You have nausea, vomiting, or drowsiness.  You have swelling around your face.  You have vision problems.  You have a stiff neck.  You have difficulty breathing.   This information is not intended to replace advice given to you by your health care provider. Make sure you discuss any questions you have with your health care provider.   Document Released: 02/12/2005 Document Revised: 03/05/2014 Document Reviewed: 02/27/2011 Elsevier Interactive Patient Education 2016 Elsevier Inc.  Furosemide tablets What is this medicine? FUROSEMIDE (fyoor [place] se mide) is a diuretic. It helps you make more urine and to lose salt and excess water from your body. This medicine is used to treat high blood pressure, and edema or swelling from heart, kidney, or liver disease. This medicine may be used for other purposes; ask your health care provider or pharmacist if you have questions. What should I tell my health care provider before I take this medicine? They need to know if you have any of these conditions: -abnormal blood electrolytes -diarrhea or vomiting -gout -heart disease -kidney disease, small amounts of urine, or difficulty passing urine -liver disease -thyroid disease -an unusual or allergic reaction to furosemide, sulfa drugs, other medicines, foods, dyes, or preservatives -pregnant or trying to get pregnant -breast-feeding How should I use this medicine? Take this medicine by mouth with a glass of water. Follow the directions on the prescription label. You may take this medicine with or without food. If it upsets your stomach, take it with food or milk. Do not take your medicine more often than directed. Remember that you will need to pass more urine after taking this medicine. Do not take your medicine at a time of day that will cause you problems. Do not take at bedtime. Talk to your pediatrician regarding the use of this medicine in children. While  this drug may be prescribed for selected conditions, precautions do apply. Overdosage: If you think you have taken too much of this medicine contact a poison control center or emergency room at once. NOTE: This medicine is only for you. Do not share this medicine with others. What if I miss a dose? If you miss a dose, take it as soon as you can. If it is almost time for your next dose, take only that dose. Do not take double or extra doses. What may interact with this medicine? -aspirin and aspirin-like medicines -certain antibiotics -chloral hydrate -cisplatin -cyclosporine -digoxin -diuretics -laxatives -lithium -medicines for blood pressure -medicines that relax muscles for surgery -methotrexate -NSAIDs, medicines for pain and inflammation like ibuprofen, naproxen, or indomethacin -phenytoin -steroid medicines like prednisone or cortisone -sucralfate -thyroid hormones This list may not describe all possible interactions. Give your health care provider a list of all the medicines, herbs, non-prescription drugs, or dietary supplements you use. Also tell them if you smoke, drink alcohol, or use illegal drugs. Some items may interact with your medicine. What should I watch for while using this medicine? Visit your doctor or health care professional for regular checks on your progress. Check your blood pressure regularly. Ask your doctor or health care professional what your blood pressure should be, and when you should contact him or her. If you  are a diabetic, check your blood sugar as directed. You may need to be on a special diet while taking this medicine. Check with your doctor. Also, ask how many glasses of fluid you need to drink a day. You must not get dehydrated. You may get drowsy or dizzy. Do not drive, use machinery, or do anything that needs mental alertness until you know how this drug affects you. Do not stand or sit up quickly, especially if you are an older patient. This  reduces the risk of dizzy or fainting spells. Alcohol can make you more drowsy and dizzy. Avoid alcoholic drinks. This medicine can make you more sensitive to the sun. Keep out of the sun. If you cannot avoid being in the sun, wear protective clothing and use sunscreen. Do not use sun lamps or tanning beds/booths. What side effects may I notice from receiving this medicine? Side effects that you should report to your doctor or health care professional as soon as possible: -blood in urine or stools -dry mouth -fever or chills -hearing loss or ringing in the ears -irregular heartbeat -muscle pain or weakness, cramps -skin rash -stomach upset, pain, or nausea -tingling or numbness in the hands or feet -unusually weak or tired -vomiting or diarrhea -yellowing of the eyes or skin Side effects that usually do not require medical attention (report to your doctor or health care professional if they continue or are bothersome): -headache -loss of appetite -unusual bleeding or bruising This list may not describe all possible side effects. Call your doctor for medical advice about side effects. You may report side effects to FDA at 1-800-FDA-1088. Where should I keep my medicine? Keep out of the reach of children. Store at room temperature between 15 and 30 degrees C (59 and 86 degrees F). Protect from light. Throw away any unused medicine after the expiration date. NOTE: This sheet is a summary. It may not cover all possible information. If you have questions about this medicine, talk to your doctor, pharmacist, or health care provider.    2016, Elsevier/Gold Standard. (2014-05-05 13:49:50)

## 2015-11-24 NOTE — Progress Notes (Signed)
Patient: Norma Johns Female    DOB: 02-07-1941   75 y.o.   MRN: [id number] Visit Date: 11/24/2015  Today's Provider: Mar Daring, PA-C   Chief Complaint  Patient presents with  . Cough   Subjective:    Cough  This is a new problem. The current episode started 1 to 4 weeks ago (Last week on Tuesday). The problem has been gradually worsening. The problem occurs constantly. The cough is productive of brown sputum. Associated symptoms include chills, ear congestion, ear pain, headaches, nasal congestion, postnasal drip, rhinorrhea, shortness of breath and wheezing. Pertinent negatives include no chest pain, fever or sore throat. The symptoms are aggravated by lying down. Treatments tried: Salt water and Robitussin cough Syrup. The treatment provided no relief.   Patient needs refill on her albuterol inhaler. She got Albuterol solution but patient doesn't have a machine. She reports this is the first time she had the solution prescribed.     Allergies  Allergen Reactions  . Atorvastatin   . Minocycline Hcl Itching  . Niacin   . Penicillins Itching  . Septra [Sulfamethoxazole-Trimethoprim]   . Vioxx [Rofecoxib]      Current Outpatient Prescriptions:  .  clopidogrel (PLAVIX) 75 MG tablet, Take 1 tablet (75 mg total) by mouth daily., Disp: 30 tablet, Rfl: 5 .  dexlansoprazole (DEXILANT) 60 MG capsule, Take 1 capsule (60 mg total) by mouth daily., Disp: 30 capsule, Rfl: 5 .  fexofenadine (ALLEGRA) 180 MG tablet, Take 180 mg by mouth daily., Disp: , Rfl:  .  fluticasone (FLONASE) 50 MCG/ACT nasal spray, Place 2 sprays into both nostrils daily., Disp: 16 g, Rfl: 5 .  Multiple Vitamin (MULTIVITAMIN) capsule, Take 1 capsule by mouth daily., Disp: , Rfl:  .  polyethylene glycol powder (GLYCOLAX/MIRALAX) powder, Take 255 g (1 Container total) by mouth once., Disp: 255 g, Rfl: 0 .  albuterol (PROVENTIL) (2.5 MG/3ML) 0.083% nebulizer solution, Take 3 mLs (2.5 mg total) by  nebulization every 6 (six) hours as needed for wheezing or shortness of breath. Reported on 06/13/2015 (Patient not taking: Reported on 11/24/2015), Disp: 75 mL, Rfl: 5 .  ALPRAZolam (XANAX) 0.5 MG tablet, Take 1 tablet (0.5 mg total) by mouth 2 (two) times daily as needed for anxiety. (Patient not taking: Reported on 11/24/2015), Disp: 30 tablet, Rfl: 5 .  escitalopram (LEXAPRO) 10 MG tablet, Take 1 tablet (10 mg total) by mouth daily. Reported on 06/13/2015 (Patient not taking: Reported on 11/24/2015), Disp: 90 tablet, Rfl: 1 .  HYDROcodone-homatropine (HYCODAN) 5-1.5 MG/5ML syrup, Take 5 mLs by mouth every 8 (eight) hours as needed for cough. (Patient not taking: Reported on 11/24/2015), Disp: 120 mL, Rfl: 0 .  levofloxacin (LEVAQUIN) 500 MG tablet, Take 1 tablet (500 mg total) by mouth daily. (Patient not taking: Reported on 11/24/2015), Disp: 7 tablet, Rfl: 0  Review of Systems  Constitutional: Positive for chills. Negative for fever.  HENT: Positive for congestion, ear pain, postnasal drip, rhinorrhea, sinus pressure and voice change. Negative for sore throat.   Respiratory: Positive for cough, chest tightness, shortness of breath and wheezing.   Cardiovascular: Negative for chest pain, palpitations and leg swelling.  Gastrointestinal: Negative for abdominal pain, nausea and vomiting.  Neurological: Positive for headaches. Negative for dizziness and light-headedness.    Social History  Substance Use Topics  . Smoking status: Never Smoker  . Smokeless tobacco: Never Used  . Alcohol use No   Objective:   BP 130/72 (BP  Location: Right Arm, Patient Position: Sitting, Cuff Size: Normal)   Pulse 68   Temp 97.9 F (36.6 C) (Oral)   Resp 16   Wt 175 lb (79.4 kg)   SpO2 98%   BMI 30.04 kg/m   Physical Exam  Constitutional: She appears well-developed and well-nourished. No distress.  HENT:  Head: Normocephalic and atraumatic.  Right Ear: Hearing, tympanic membrane, external ear and ear canal  normal.  Left Ear: Hearing, tympanic membrane, external ear and ear canal normal.  Nose: Right sinus exhibits maxillary sinus tenderness and frontal sinus tenderness. Left sinus exhibits maxillary sinus tenderness and frontal sinus tenderness.  Mouth/Throat: Uvula is midline, oropharynx is clear and moist and mucous membranes are normal. No oropharyngeal exudate, posterior oropharyngeal edema or posterior oropharyngeal erythema (cobblestoning with post nasal drainage).  Eyes: Conjunctivae and EOM are normal. Pupils are equal, round, and reactive to light. Right eye exhibits no discharge. Left eye exhibits no discharge.  Neck: Normal range of motion. Neck supple. No JVD present. No tracheal deviation present. No Brudzinski's sign and no Kernig's sign noted. No thyromegaly present.    Cardiovascular: Normal rate, regular rhythm and normal heart sounds.  Exam reveals no gallop and no friction rub.   No murmur heard. Pulmonary/Chest: Effort normal. No stridor. No respiratory distress. She has no decreased breath sounds. She has wheezes (throughout). She has no rhonchi. She has no rales. She exhibits no tenderness.  Musculoskeletal: She exhibits edema (trace edema bilateral lower extremities).  Lymphadenopathy:    She has no cervical adenopathy.  Skin: Skin is warm and dry.  Vitals reviewed.     Assessment & Plan:     1. Reactive airway disease, mild intermittent, with acute exacerbation Albuterol was refilled as below to replace the albuterol nebulizer solution as the patient does not have a nebulizer. - albuterol (PROVENTIL HFA;VENTOLIN HFA) 108 (90 Base) MCG/ACT inhaler; Inhale 2 puffs into the lungs every 4 (four) hours as needed for wheezing or shortness of breath.  Dispense: 1 Inhaler; Refill: 11  2. Acute frontal sinusitis, recurrence not specified Worsening symptoms that have not responded to OTC medications. Will give levaquin as below. Continue allergy medications. Stay well hydrated  and get plenty of rest. Hycodan cough syrup was given as below for nighttime cough. She is to continue taking Robitussin during the day. Drowsiness precautions were given to patient. Call if no symptom improvement or if symptoms worsen. - HYDROcodone-homatropine (HYCODAN) 5-1.5 MG/5ML syrup; Take 5 mLs by mouth every 8 (eight) hours as needed for cough.  Dispense: 120 mL; Refill: 0 - levofloxacin (LEVAQUIN) 500 MG tablet; Take 1 tablet (500 mg total) by mouth daily.  Dispense: 7 tablet; Refill: 0  3. Bronchitis Worsening symptoms of increasing wheezes secondary to patient being out of her albuterol inhaler. Albuterol was refilled as above. I will give Levaquin as below with prednisone for wheezing. She is to call if symptoms fail to improve or worsen. - levofloxacin (LEVAQUIN) 500 MG tablet; Take 1 tablet (500 mg total) by mouth daily.  Dispense: 7 tablet; Refill: 0 - predniSONE (STERAPRED UNI-PAK 21 TAB) 10 MG (21) TBPK tablet; Take as directed on package instructions  Dispense: 21 tablet; Refill: 0  4. Bilateral edema of lower extremity Patient reports increasing fluid stating she feels like she is holding fluid all over but it is noticeable in her lower extremities bilaterally. I will add furosemide back with potassium. She is to see me in the next few weeks for her physical and  we will be checking labs at that time. Will check to make sure potassium is staying within normal limits as well as checking kidney function. - furosemide (LASIX) 20 MG tablet; Take 1 tablet (20 mg total) by mouth daily.  Dispense: 30 tablet; Refill: 0 - potassium chloride SA (K-DUR,KLOR-CON) 20 MEQ tablet; Take 1 tablet (20 mEq total) by mouth daily.  Dispense: 30 tablet; Refill: 0  5. Neck mass Patient noted soft tissue mass on the left neck near the clavicle. Couldn't palpate slightly the patient does have overlying skin fold at that area. I did not feel any pulse associated with the mass. We'll order ultrasound for  further evaluation. I will follow-up with her pending the results of the ultrasound. - US Soft Tissue Head/Neck; Future       Mar Daring, PA-C  [facility]

## 2015-11-30 ENCOUNTER — Telehealth: Payer: Self-pay

## 2015-11-30 ENCOUNTER — Ambulatory Visit
Admission: RE | Admit: 2015-11-30 | Discharge: 2015-11-30 | Disposition: A | Discharge status: Home / Self Care | Payer: Medicare Other | Source: Ambulatory Visit | Attending: Physician Assistant | Admitting: Physician Assistant

## 2015-11-30 DIAGNOSIS — R221 Localized swelling, mass and lump, neck: Secondary | ICD-10-CM | POA: Insufficient documentation

## 2015-11-30 NOTE — Telephone Encounter (Signed)
Patient has been advised. KW 

## 2015-11-30 NOTE — Telephone Encounter (Signed)
-----   Message from Mar Daring, [place] sent at 11/30/2015  2:30 PM EDT ----- [place] was normal. Most likely extra soft tissue.

## 2015-12-12 ENCOUNTER — Encounter: Payer: Self-pay | Admitting: Physician Assistant

## 2015-12-12 ENCOUNTER — Ambulatory Visit: Payer: Self-pay | Admitting: Physician Assistant

## 2015-12-12 ENCOUNTER — Ambulatory Visit (INDEPENDENT_AMBULATORY_CARE_PROVIDER_SITE_OTHER): Payer: Medicare Other | Admitting: Physician Assistant

## 2015-12-12 VITALS — BP 120/78 | HR 78 | Temp 98.2°F | Resp 16 | Wt 173.0 lb

## 2015-12-12 DIAGNOSIS — I10 Essential (primary) hypertension: Secondary | ICD-10-CM

## 2015-12-12 DIAGNOSIS — Z1231 Encounter for screening mammogram for malignant neoplasm of breast: Secondary | ICD-10-CM | POA: Diagnosis not present

## 2015-12-12 DIAGNOSIS — E119 Type 2 diabetes mellitus without complications: Secondary | ICD-10-CM

## 2015-12-12 DIAGNOSIS — J40 Bronchitis, not specified as acute or chronic: Secondary | ICD-10-CM | POA: Diagnosis not present

## 2015-12-12 DIAGNOSIS — F419 Anxiety disorder, unspecified: Secondary | ICD-10-CM

## 2015-12-12 DIAGNOSIS — Z1239 Encounter for other screening for malignant neoplasm of breast: Secondary | ICD-10-CM

## 2015-12-12 DIAGNOSIS — E7800 Pure hypercholesterolemia, unspecified: Secondary | ICD-10-CM

## 2015-12-12 DIAGNOSIS — H66003 Acute suppurative otitis media without spontaneous rupture of ear drum, bilateral: Secondary | ICD-10-CM | POA: Diagnosis not present

## 2015-12-12 LAB — POCT GLYCOSYLATED HEMOGLOBIN (HGB A1C)
ESTIMATED AVERAGE GLUCOSE: 140
HEMOGLOBIN A1C: 6.5

## 2015-12-12 MED ORDER — LEVOFLOXACIN 500 MG PO TABS: 500.0000 mg | ORAL_TABLET | Freq: Every day | ORAL | 0 refills | Status: DC

## 2015-12-12 NOTE — Patient Instructions (Signed)
Diabetes and Exercise Exercising regularly is important. It is not just about losing weight. It has many health benefits, such as:  Improving your overall fitness, flexibility, and endurance.  Increasing your bone density.  Helping with weight control.  Decreasing your body fat.  Increasing your muscle strength.  Reducing stress and tension.  Improving your overall health. People with diabetes who exercise gain additional benefits because exercise:  Reduces appetite.  Improves the body's use of blood sugar (glucose).  Helps lower or control blood glucose.  Decreases blood pressure.  Helps control blood lipids (such as cholesterol and triglycerides).  Improves the body's use of the hormone insulin by:  Increasing the body's insulin sensitivity.  Reducing the body's insulin needs.  Decreases the risk for heart disease because exercising:  Lowers cholesterol and triglycerides levels.  Increases the levels of good cholesterol (such as high-density lipoproteins [HDL]) in the body.  Lowers blood glucose levels. YOUR ACTIVITY PLAN  Choose an activity that you enjoy, and set realistic goals. To exercise safely, you should begin practicing any new physical activity slowly, and gradually increase the intensity of the exercise over time. Your health care provider or diabetes educator can help create an activity plan that works for you. General recommendations include:  Encouraging children to engage in at least 60 minutes of physical activity each day.  Stretching and performing strength training exercises, such as yoga or weight lifting, at least 2 times per week.  Performing a total of at least 150 minutes of moderate-intensity exercise each week, such as brisk walking or water aerobics.  Exercising at least 3 days per week, making sure you allow no more than 2 consecutive days to pass without exercising.  Avoiding long periods of inactivity (90 minutes or more). When you  have to spend an extended period of time sitting down, take frequent breaks to walk or stretch. RECOMMENDATIONS FOR EXERCISING WITH TYPE 1 OR TYPE 2 DIABETES   Check your blood glucose before exercising. If blood glucose levels are greater than 240 mg/dL, check for urine ketones. Do not exercise if ketones are present.  Avoid injecting insulin into areas of the body that are going to be exercised. For example, avoid injecting insulin into:  The arms when playing tennis.  The legs when jogging.  Keep a record of:  Food intake before and after you exercise.  Expected peak times of insulin action.  Blood glucose levels before and after you exercise.  The type and amount of exercise you have done.  Review your records with your health care provider. Your health care provider will help you to develop guidelines for adjusting food intake and insulin amounts before and after exercising.  If you take insulin or oral hypoglycemic agents, watch for signs and symptoms of hypoglycemia. They include:  Dizziness.  Shaking.  Sweating.  Chills.  Confusion.  Drink plenty of water while you exercise to prevent dehydration or heat stroke. Body water is lost during exercise and must be replaced.  Talk to your health care provider before starting an exercise program to make sure it is safe for you. Remember, almost any type of activity is better than none.   This information is not intended to replace advice given to you by your health care provider. Make sure you discuss any questions you have with your health care provider.   Document Released: 05/05/2003 Document Revised: 06/29/2014 Document Reviewed: 07/22/2012 Elsevier Interactive Patient Education 2016 [employer] Inc.  

## 2015-12-12 NOTE — Progress Notes (Signed)
Patient: Norma Johns Female    DOB: 24-Aug-1940   75 y.o.   MRN: [id number] Visit Date: 12/12/2015  Today's Provider: Mar Daring, PA-C   Chief Complaint  Patient presents with  . Diabetes  . Hypertension  . Anxiety   Subjective:    HPI  Diabetes Mellitus Type II, Follow-up:   Lab Results  Component Value Date   HGBA1C 6.5 12/12/2015   HGBA1C 6.0 06/15/2015   HGBA1C 6.0 01/14/2015    Last seen for diabetes 6 months ago.  Management since then includes no changes. She reports excellent compliance with treatment. She is not having side effects.  Current symptoms include none and have been stable. Home blood sugar records: not being checked  Episodes of hypoglycemia? no   Current Insulin Regimen:  Most Recent Eye Exam:  Weight trend: stable Prior visit with dietician: no Current diet: in general, a "healthy" diet   Current exercise: housecleaning Patient was recently on steroids for bronchitis which may have affected A1c.  Pertinent Labs:    Component Value Date/Time   CHOL 216 (H) 06/15/2015 1123   CHOL 198 06/22/2013 1110   TRIG 132 06/15/2015 1123   TRIG 112 06/22/2013 1110   HDL 53 06/15/2015 1123   HDL 45 06/22/2013 1110   LDLCALC 137 (H) 06/15/2015 1123   LDLCALC 131 (H) 06/22/2013 1110   CREATININE 0.67 06/15/2015 1123   CREATININE 0.65 12/20/2013 0432    Wt Readings from Last 3 Encounters:  12/12/15 173 lb (78.5 kg)  11/24/15 175 lb (79.4 kg)  07/29/15 172 lb (78 kg)    ------------------------------------------------------------------------   Hypertension, follow-up:  BP Readings from Last 3 Encounters:  12/12/15 120/78  11/24/15 130/72  07/29/15 126/64    She was last seen for hypertension 6 months ago.  BP at that visit was 130/72. Management changes since that visit include no changes. She reports excellent compliance with treatment. She is not having side effects.  She is not exercising. She is adherent to  low salt diet.   Outside blood pressures are stable. She is experiencing none.  Patient denies chest pain.   Cardiovascular risk factors include advanced age (older than 40 for men, 20 for women), diabetes mellitus and hypertension.  Use of agents associated with hypertension: none.     Weight trend: stable Wt Readings from Last 3 Encounters:  12/12/15 173 lb (78.5 kg)  11/24/15 175 lb (79.4 kg)  07/29/15 172 lb (78 kg)    Current diet: in general, a "healthy" diet    ------------------------------------------------------------------------   Anxiety, Follow-up  She  was last seen for this 6 months ago. Changes made at last visit include no changes.   She reports excellent compliance with treatment. She is not having side effects.   She reports excellent tolerance of treatment. Current symptoms include: difficulty concentrating She feels she is Unchanged since last visit.  ------------------------------------------------------------------------  Patient c/o acid reflux worsening. Patient reports taking Dexilant and reports mild improvement.     Allergies  Allergen Reactions  . Atorvastatin   . Minocycline Hcl Itching  . Niacin   . Penicillins Itching  . Septra [Sulfamethoxazole-Trimethoprim]   . Vioxx [Rofecoxib]      Current Outpatient Prescriptions:  .  albuterol (PROVENTIL HFA;VENTOLIN HFA) 108 (90 Base) MCG/ACT inhaler, Inhale 2 puffs into the lungs every 4 (four) hours as needed for wheezing or shortness of breath., Disp: 1 Inhaler, Rfl: 11 .  ALPRAZolam (XANAX) 0.5 MG  tablet, Take 1 tablet (0.5 mg total) by mouth 2 (two) times daily as needed for anxiety., Disp: 30 tablet, Rfl: 5 .  clopidogrel (PLAVIX) 75 MG tablet, Take 1 tablet (75 mg total) by mouth daily., Disp: 30 tablet, Rfl: 5 .  dexlansoprazole (DEXILANT) 60 MG capsule, Take 1 capsule (60 mg total) by mouth daily., Disp: 30 capsule, Rfl: 5 .  fexofenadine (ALLEGRA) 180 MG tablet, Take 180 mg by  mouth daily., Disp: , Rfl:  .  fluticasone (FLONASE) 50 MCG/ACT nasal spray, Place 2 sprays into both nostrils daily., Disp: 16 g, Rfl: 5 .  HYDROcodone-homatropine (HYCODAN) 5-1.5 MG/5ML syrup, Take 5 mLs by mouth every 8 (eight) hours as needed for cough., Disp: 120 mL, Rfl: 0 .  Multiple Vitamin (MULTIVITAMIN) capsule, Take 1 capsule by mouth daily., Disp: , Rfl:  .  polyethylene glycol powder (GLYCOLAX/MIRALAX) powder, Take 255 g (1 Container total) by mouth once., Disp: 255 g, Rfl: 0 .  vitamin B-12 (CYANOCOBALAMIN) 500 MCG tablet, Take 500 mcg by mouth daily., Disp: , Rfl:  .  furosemide (LASIX) 20 MG tablet, Take 1 tablet (20 mg total) by mouth daily. (Patient not taking: Reported on 12/12/2015), Disp: 30 tablet, Rfl: 0 .  potassium chloride SA (K-DUR,KLOR-CON) 20 MEQ tablet, Take 1 tablet (20 mEq total) by mouth daily. (Patient not taking: Reported on 12/12/2015), Disp: 30 tablet, Rfl: 0  Review of Systems  Constitutional: Negative.   HENT: Positive for congestion.   Respiratory: Positive for cough and wheezing.   Cardiovascular: Positive for leg swelling.  Gastrointestinal: Negative.        Acid reflux  Neurological: Negative.   Psychiatric/Behavioral: Positive for decreased concentration and sleep disturbance. The patient is nervous/anxious.     Social History  Substance Use Topics  . Smoking status: Never Smoker  . Smokeless tobacco: Never Used  . Alcohol use No   Objective:   BP 120/78 (BP Location: Left Arm, Patient Position: Sitting, Cuff Size: Large)   Pulse 78   Temp 98.2 F (36.8 C) (Oral)   Resp 16   Wt 173 lb (78.5 kg)   SpO2 97%   BMI 29.70 kg/m   Physical Exam  Constitutional: She appears well-developed and well-nourished. No distress.  HENT:  Head: Normocephalic and atraumatic.  Right Ear: Hearing, external ear and ear canal normal. Tympanic membrane is erythematous and bulging. Tympanic membrane is not perforated. A middle ear effusion is present.    Left Ear: Hearing, external ear and ear canal normal. Tympanic membrane is erythematous and bulging. Tympanic membrane is not perforated. A middle ear effusion is present.  Nose: Mucosal edema present. Right sinus exhibits no maxillary sinus tenderness and no frontal sinus tenderness. Left sinus exhibits no maxillary sinus tenderness and no frontal sinus tenderness.  Mouth/Throat: Uvula is midline, oropharynx is clear and moist and mucous membranes are normal. No oropharyngeal exudate, posterior oropharyngeal edema or posterior oropharyngeal erythema.  Eyes: Conjunctivae are normal. Pupils are equal, round, and reactive to light. Right eye exhibits no discharge. Left eye exhibits no discharge. No scleral icterus.  Neck: Normal range of motion. Neck supple. No JVD present. No tracheal deviation present. No thyromegaly present.  Cardiovascular: Normal rate, regular rhythm and normal heart sounds.  Exam reveals no gallop and no friction rub.   No murmur heard. Pulmonary/Chest: Effort normal and breath sounds normal. No stridor. No respiratory distress. She has no wheezes. She has no rales.  Musculoskeletal: She exhibits no edema.  Lymphadenopathy:  She has no cervical adenopathy.  Skin: Skin is warm and dry. She is not diaphoretic.  Psychiatric: She has a normal mood and affect. Her behavior is normal. Judgment and thought content normal.  Vitals reviewed.     Assessment & Plan:     1. Type 2 diabetes mellitus without complication, without long-term current use of insulin (HCC) A1c increased to 6.5 from 6.0. May be secondary to steroid use. Will check labs as below and f/u pending results. I will see her back in 3 months. - POCT glycosylated hemoglobin (Hb A1C) - CBC w/Diff/Platelet - Comprehensive Metabolic Panel (CMET)  2. Acute suppurative otitis media of both ears without spontaneous rupture of tympanic membranes, recurrence not specified Worsening symptoms. Will give levaquin as below.  She is to call if symptoms still do not improve.  - levofloxacin (LEVAQUIN) 500 MG tablet; Take 1 tablet (500 mg total) by mouth daily.  Dispense: 10 tablet; Refill: 0  3. Bronchitis No improvement. Will give levaquin as below. She is to call if no improvement. Discussed CXR if still no improvement.  - levofloxacin (LEVAQUIN) 500 MG tablet; Take 1 tablet (500 mg total) by mouth daily.  Dispense: 10 tablet; Refill: 0  4. Benign hypertension Stable. Continue current medical treatment plan. Will check labs as below and f/u pending results. - CBC w/Diff/Platelet - Comprehensive Metabolic Panel (CMET)  5. Anxiety Stable. Not using lexapro any longer.   6. Hypercholesteremia Diet controlled. Will check labs as below and f/u pending results. - Lipid Profile  7. Breast cancer screening There is no family history of breast cancer. She does perform regular self breast exams. Mammogram was ordered as below. Information for [facility] was given to patient so she may schedule her mammogram at her convenience. - MM Digital Screening; Future       Mar Daring, PA-C  [facility]

## 2015-12-13 ENCOUNTER — Other Ambulatory Visit
Admission: RE | Admit: 2015-12-13 | Discharge: 2015-12-13 | Disposition: A | Discharge status: Home / Self Care | Payer: Medicare Other | Source: Ambulatory Visit | Attending: Physician Assistant | Admitting: Physician Assistant

## 2015-12-13 ENCOUNTER — Telehealth: Payer: Self-pay

## 2015-12-13 DIAGNOSIS — I10 Essential (primary) hypertension: Secondary | ICD-10-CM | POA: Insufficient documentation

## 2015-12-13 DIAGNOSIS — E7800 Pure hypercholesterolemia, unspecified: Secondary | ICD-10-CM | POA: Insufficient documentation

## 2015-12-13 DIAGNOSIS — E119 Type 2 diabetes mellitus without complications: Secondary | ICD-10-CM | POA: Insufficient documentation

## 2015-12-13 LAB — COMPREHENSIVE METABOLIC PANEL
ALBUMIN: 4 g/dL (ref 3.5–5.0)
ALT: 12 U/L — AB (ref 14–54)
AST: 15 U/L (ref 15–41)
Alkaline Phosphatase: 71 U/L (ref 38–126)
Anion gap: 6 (ref 5–15)
BILIRUBIN TOTAL: 1 mg/dL (ref 0.3–1.2)
BUN: 9 mg/dL (ref 6–20)
CO2: 27 mmol/L (ref 22–32)
CREATININE: 0.69 mg/dL (ref 0.44–1.00)
Calcium: 9.9 mg/dL (ref 8.9–10.3)
Chloride: 107 mmol/L (ref 101–111)
GFR calc Af Amer: >60 mL/min (ref >60)
GFR calc non Af Amer: >60 mL/min (ref >60)
GLUCOSE: 130 mg/dL — AB (ref 65–99)
POTASSIUM: 4.5 mmol/L (ref 3.5–5.1)
Sodium: 140 mmol/L (ref 135–145)
TOTAL PROTEIN: 6.9 g/dL (ref 6.5–8.1)

## 2015-12-13 LAB — CBC WITH DIFFERENTIAL/PLATELET
BASOS ABS: 0 10*3/uL (ref 0–0.1)
BASOS PCT: 1 %
EOS PCT: 2 %
Eosinophils Absolute: 0.1 10*3/uL (ref 0–0.7)
HEMATOCRIT: 39.5 % (ref 35.0–47.0)
HEMOGLOBIN: 13.5 g/dL (ref 12.0–16.0)
LYMPHS ABS: 1.9 10*3/uL (ref 1.0–3.6)
LYMPHS PCT: 29 %
MCH: 30.6 pg (ref 26.0–34.0)
MCHC: 34.3 g/dL (ref 32.0–36.0)
MCV: 89.1 fL (ref 80.0–100.0)
MONOS PCT: 10 %
Monocytes Absolute: 0.6 10*3/uL (ref 0.2–0.9)
Neutro Abs: 3.7 10*3/uL (ref 1.4–6.5)
Neutrophils Relative %: 58 %
Platelets: 258 10*3/uL (ref 150–440)
RBC: 4.43 MIL/uL (ref 3.80–5.20)
RDW: 14.5 % (ref 11.5–14.5)
WBC: 6.3 10*3/uL (ref 3.6–11.0)

## 2015-12-13 LAB — LIPID PANEL
CHOL/HDL RATIO: 4.4 ratio
Cholesterol: 224 mg/dL — ABNORMAL HIGH (ref 0–200)
HDL: 51 mg/dL (ref >40)
LDL Cholesterol: 149 mg/dL — ABNORMAL HIGH (ref 0–99)
Triglycerides: 118 mg/dL (ref <150)
VLDL: 24 mg/dL (ref 0–40)

## 2015-12-13 NOTE — Telephone Encounter (Signed)
-----   Message from Mar Daring, [place] sent at 12/13/2015 11:44 AM EDT ----- All labs are within normal limits and stable.  Cholesterol still borderline elevated but fairly stable from last year. Sugar was 130 which was up from previous. We discussed A1c results in office. Thanks! -JB

## 2015-12-13 NOTE — Telephone Encounter (Signed)
Patient advised as directed below.  Thanks,  -Joseline 

## 2016-01-24 ENCOUNTER — Encounter: Payer: Self-pay | Admitting: Physician Assistant

## 2016-01-24 ENCOUNTER — Ambulatory Visit (INDEPENDENT_AMBULATORY_CARE_PROVIDER_SITE_OTHER): Payer: Medicare Other | Admitting: Physician Assistant

## 2016-01-24 VITALS — BP 152/70 | HR 71 | Temp 97.7°F | Resp 16 | Wt 171.2 lb

## 2016-01-24 DIAGNOSIS — H66001 Acute suppurative otitis media without spontaneous rupture of ear drum, right ear: Secondary | ICD-10-CM | POA: Diagnosis not present

## 2016-01-24 DIAGNOSIS — J0140 Acute pansinusitis, unspecified: Secondary | ICD-10-CM | POA: Diagnosis not present

## 2016-01-24 MED ORDER — LEVOFLOXACIN 500 MG PO TABS: 500.0000 mg | ORAL_TABLET | Freq: Every day | ORAL | 0 refills | Status: DC

## 2016-01-24 MED ORDER — PREDNISONE 10 MG (21) PO TBPK: ORAL_TABLET | ORAL | 0 refills | Status: DC

## 2016-01-24 NOTE — Patient Instructions (Signed)

## 2016-01-24 NOTE — Progress Notes (Signed)
Patient: Norma Johns Female    DOB: 05-Mar-1940   75 y.o.   MRN: [id number] Visit Date: 01/24/2016  Today's Provider: Mar Daring, PA-C   Chief Complaint  Patient presents with  . Cough   Subjective:    Cough  This is a new problem. Associated symptoms include chills, ear congestion, headaches, nasal congestion, postnasal drip, rhinorrhea, sweats and wheezing. Pertinent negatives include no chest pain, fever, sore throat or shortness of breath. She has tried ipratropium inhaler (allegra, nasal spray, now saline mist) for the symptoms. The treatment provided no relief.      Allergies  Allergen Reactions  . Atorvastatin   . Minocycline Hcl Itching  . Niacin   . Penicillins Itching  . Septra [Sulfamethoxazole-Trimethoprim]   . Vioxx [Rofecoxib]      Current Outpatient Prescriptions:  .  albuterol (PROVENTIL HFA;VENTOLIN HFA) 108 (90 Base) MCG/ACT inhaler, Inhale 2 puffs into the lungs every 4 (four) hours as needed for wheezing or shortness of breath., Disp: 1 Inhaler, Rfl: 11 .  ALPRAZolam (XANAX) 0.5 MG tablet, Take 1 tablet (0.5 mg total) by mouth 2 (two) times daily as needed for anxiety., Disp: 30 tablet, Rfl: 5 .  clopidogrel (PLAVIX) 75 MG tablet, Take 1 tablet (75 mg total) by mouth daily., Disp: 30 tablet, Rfl: 5 .  dexlansoprazole (DEXILANT) 60 MG capsule, Take 1 capsule (60 mg total) by mouth daily., Disp: 30 capsule, Rfl: 5 .  fexofenadine (ALLEGRA) 180 MG tablet, Take 180 mg by mouth daily., Disp: , Rfl:  .  fluticasone (FLONASE) 50 MCG/ACT nasal spray, Place 2 sprays into both nostrils daily., Disp: 16 g, Rfl: 5 .  levofloxacin (LEVAQUIN) 500 MG tablet, Take 1 tablet (500 mg total) by mouth daily., Disp: 10 tablet, Rfl: 0 .  Multiple Vitamin (MULTIVITAMIN) capsule, Take 1 capsule by mouth daily., Disp: , Rfl:  .  polyethylene glycol powder (GLYCOLAX/MIRALAX) powder, Take 255 g (1 Container total) by mouth once., Disp: 255 g, Rfl: 0 .  vitamin B-12  (CYANOCOBALAMIN) 500 MCG tablet, Take 500 mcg by mouth daily., Disp: , Rfl:  .  furosemide (LASIX) 20 MG tablet, Take 1 tablet (20 mg total) by mouth daily. (Patient not taking: Reported on 01/24/2016), Disp: 30 tablet, Rfl: 0 .  potassium chloride SA (K-DUR,KLOR-CON) 20 MEQ tablet, Take 1 tablet (20 mEq total) by mouth daily. (Patient not taking: Reported on 01/24/2016), Disp: 30 tablet, Rfl: 0  Review of Systems  Constitutional: Positive for chills. Negative for fever.  HENT: Positive for congestion, postnasal drip, rhinorrhea and sinus pressure. Negative for sore throat.   Respiratory: Positive for cough, chest tightness and wheezing. Negative for shortness of breath.   Cardiovascular: Negative for chest pain, palpitations and leg swelling.  Gastrointestinal: Negative for abdominal pain.  Neurological: Positive for headaches. Negative for dizziness.    Social History  Substance Use Topics  . Smoking status: Never Smoker  . Smokeless tobacco: Never Used  . Alcohol use No   Objective:   BP (!) 152/70 (BP Location: Right Arm, Patient Position: Sitting, Cuff Size: Normal)   Pulse 71   Temp 97.7 F (36.5 C) (Oral)   Resp 16   Wt 171 lb 3.2 oz (77.7 kg)   SpO2 97%   BMI 29.39 kg/m   Physical Exam  Constitutional: She appears well-developed and well-nourished. No distress.  HENT:  Head: Normocephalic and atraumatic.  Right Ear: Hearing, external ear and ear canal normal. Tympanic membrane is  erythematous and bulging. Tympanic membrane is not perforated. A middle ear effusion is present.  Left Ear: Hearing, tympanic membrane, external ear and ear canal normal.  Nose: Mucosal edema present. Right sinus exhibits maxillary sinus tenderness and frontal sinus tenderness. Left sinus exhibits maxillary sinus tenderness and frontal sinus tenderness.  Mouth/Throat: Uvula is midline and mucous membranes are normal. Posterior oropharyngeal erythema (cobblestoning) present. No oropharyngeal  exudate or posterior oropharyngeal edema.  Eyes: Conjunctivae are normal. Pupils are equal, round, and reactive to light. Right eye exhibits no discharge. Left eye exhibits no discharge. No scleral icterus.  Neck: Normal range of motion. Neck supple. No tracheal deviation present. No thyromegaly present.  Cardiovascular: Normal rate, regular rhythm and normal heart sounds.  Exam reveals no gallop and no friction rub.   No murmur heard. Pulmonary/Chest: Effort normal and breath sounds normal. No stridor. No respiratory distress. She has no wheezes. She has no rales.  Lymphadenopathy:    She has no cervical adenopathy.  Skin: Skin is warm and dry. She is not diaphoretic.  Vitals reviewed.      Assessment & Plan:     1. Acute pansinusitis, recurrence not specified Worsening symptoms that have not responded to OTC medications. Will give Prednisone taper as below. Continue allergy medications. Stay well hydrated and get plenty of rest. Call if no symptom improvement or if symptoms worsen. - predniSONE (STERAPRED UNI-PAK 21 TAB) 10 MG (21) TBPK tablet; Take as directed on package instructions  Dispense: 21 tablet; Refill: 0  2. Acute suppurative otitis media of right ear without spontaneous rupture of tympanic membrane, recurrence not specified Worsening symptoms. I will give Levaquin as below for sinus infection and otitis media due to patient allergies and previous success with this antibiotic. She is to call if symptoms do not improve or worsen. - levofloxacin (LEVAQUIN) 500 MG tablet; Take 1 tablet (500 mg total) by mouth daily.  Dispense: 14 tablet; Refill: 0       Mar Daring, PA-C  [facility]

## 2016-01-31 ENCOUNTER — Other Ambulatory Visit: Payer: Self-pay | Admitting: Physician Assistant

## 2016-01-31 DIAGNOSIS — J4521 Mild intermittent asthma with (acute) exacerbation: Secondary | ICD-10-CM

## 2016-01-31 DIAGNOSIS — J0140 Acute pansinusitis, unspecified: Secondary | ICD-10-CM

## 2016-01-31 DIAGNOSIS — R059 Cough, unspecified: Secondary | ICD-10-CM

## 2016-01-31 DIAGNOSIS — H66001 Acute suppurative otitis media without spontaneous rupture of ear drum, right ear: Secondary | ICD-10-CM

## 2016-01-31 DIAGNOSIS — R05 Cough: Secondary | ICD-10-CM

## 2016-01-31 MED ORDER — LEVOFLOXACIN 500 MG PO TABS: 500.0000 mg | ORAL_TABLET | Freq: Every day | ORAL | 0 refills | Status: DC

## 2016-01-31 MED ORDER — BENZONATATE 100 MG PO CAPS: 100.0000 mg | ORAL_CAPSULE | Freq: Three times a day (TID) | ORAL | 0 refills | Status: DC | PRN

## 2016-01-31 MED ORDER — PREDNISONE 10 MG (21) PO TBPK: ORAL_TABLET | ORAL | 0 refills | Status: DC

## 2016-01-31 NOTE — Telephone Encounter (Signed)
Sent in second round of abx, prednisone and tessalon perles for cough. She is to be re-evaluated if still no improvement in symptoms or if breathing worsens.

## 2016-01-31 NOTE — Telephone Encounter (Signed)
Pt was in last week has finished the antibiotic and prednisone  She is coughing a lot and wants to know if you will call in a cough medication.  Ears still hurting too.  audibly sounds bad on the phone.  She uses [facility]  Call back is [phone]

## 2016-01-31 NOTE — Telephone Encounter (Signed)
Pt has productive cough with greenish/brown sputum. Pt is wheezing; having to use inhaler with relief. Pt reports she was running fever of 101 yesterday. Abx did help sx some. Please advise. Renaldo Fiddler, CMA

## 2016-01-31 NOTE — Telephone Encounter (Signed)
Pt advised. Pt also requesting refill on inhaler. Renaldo Fiddler, CMA

## 2016-02-01 MED ORDER — ALBUTEROL SULFATE HFA 108 (90 BASE) MCG/ACT IN AERS: 2.0000 | INHALATION_SPRAY | RESPIRATORY_TRACT | 11 refills | Status: DC | PRN

## 2016-02-27 DIAGNOSIS — C50919 Malignant neoplasm of unspecified site of unspecified female breast: Secondary | ICD-10-CM

## 2016-02-27 HISTORY — DX: Malignant neoplasm of unspecified site of unspecified female breast: C50.919

## 2016-03-12 ENCOUNTER — Telehealth: Payer: Self-pay | Admitting: Physician Assistant

## 2016-03-12 DIAGNOSIS — K219 Gastro-esophageal reflux disease without esophagitis: Secondary | ICD-10-CM

## 2016-03-12 MED ORDER — PANTOPRAZOLE SODIUM 40 MG PO TBEC: 40.0000 mg | DELAYED_RELEASE_TABLET | Freq: Two times a day (BID) | ORAL | 1 refills | Status: DC

## 2016-03-12 MED ORDER — DEXLANSOPRAZOLE 60 MG PO CPDR: 60.0000 mg | DELAYED_RELEASE_CAPSULE | Freq: Every day | ORAL | 5 refills | Status: DC

## 2016-03-12 NOTE — Telephone Encounter (Signed)
Please review-aa 

## 2016-03-12 NOTE — Telephone Encounter (Signed)
Pt called saying she went to pick up her dexilant at the pharmacy and it was 114.00.  She said she use to pay 8.00 a rx refill.  She wants to know if there is something else she can take.  She uses [facility].  Thanks Con Memos

## 2016-03-12 NOTE — Telephone Encounter (Signed)
Pt contacted office for refill request on the following medications: dexlansoprazole (DEXILANT) 60 MG capsule Last Rx: 09/12/15 with 5 refills LOV: 01/24/16 Pt stated she is out of the medication and the pharmacy was supposed to have sent a refill request last week. Pt is requesting this be sent to [facility] today if possible. Please advise. Thanks TNP

## 2016-03-12 NOTE — Telephone Encounter (Signed)
Pt advised. Pt states she tried this medication before a long time ago but is not sure if it was BID. Pt will try and let us know-aa

## 2016-03-12 NOTE — Telephone Encounter (Signed)
Refill sent.

## 2016-03-12 NOTE — Telephone Encounter (Signed)
Will send protonix for BID use. She needs to let us know if this is not as effective.

## 2016-03-14 ENCOUNTER — Ambulatory Visit: Payer: Self-pay

## 2016-03-14 ENCOUNTER — Encounter: Payer: Self-pay | Admitting: Physician Assistant

## 2016-04-05 ENCOUNTER — Ambulatory Visit: Payer: Medicare Other | Admitting: Physician Assistant

## 2016-04-05 ENCOUNTER — Ambulatory Visit (INDEPENDENT_AMBULATORY_CARE_PROVIDER_SITE_OTHER): Payer: Medicare Other

## 2016-04-05 VITALS — BP 142/80 | HR 64 | Temp 98.8°F | Ht 64.0 in | Wt 173.2 lb

## 2016-04-05 DIAGNOSIS — E119 Type 2 diabetes mellitus without complications: Secondary | ICD-10-CM

## 2016-04-05 DIAGNOSIS — E7800 Pure hypercholesterolemia, unspecified: Secondary | ICD-10-CM

## 2016-04-05 DIAGNOSIS — Z0000 Encounter for general adult medical examination without abnormal findings: Secondary | ICD-10-CM | POA: Diagnosis not present

## 2016-04-05 DIAGNOSIS — I10 Essential (primary) hypertension: Secondary | ICD-10-CM

## 2016-04-05 LAB — POCT UA - MICROALBUMIN: Microalbumin Ur, POC: 20 mg/L

## 2016-04-05 LAB — POCT GLYCOSYLATED HEMOGLOBIN (HGB A1C)
Est. average glucose Bld gHb Est-mCnc: 131
Hemoglobin A1C: 6.2

## 2016-04-05 MED ORDER — LISINOPRIL-HYDROCHLOROTHIAZIDE 10-12.5 MG PO TABS
1.0000 | ORAL_TABLET | Freq: Every day | ORAL | 3 refills | Status: DC
Start: 2016-04-05 — End: 2016-06-12

## 2016-04-05 NOTE — Progress Notes (Signed)
Subjective:   Norma Johns is a 75 y.o. female who presents for Medicare Annual (Subsequent) preventive examination.  Review of Systems:  N/A  Cardiac Risk Factors include: advanced age (>3men, >65 women);diabetes mellitus;dyslipidemia;hypertension;obesity (BMI >30kg/m2)     Objective:     Vitals: BP (!) 142/80 (BP Location: Right Arm)   Pulse 64   Temp 98.8 F (37.1 C) (Oral)   Ht 5\' 4"  (1.626 m)   Wt 173 lb 3.2 oz (78.6 kg)   BMI 29.73 kg/m   Body mass index is 29.73 kg/m.   Tobacco History  Smoking Status  . Never Smoker  Smokeless Tobacco  . Never Used     Counseling given: Not Answered   Past Medical History:  Diagnosis Date  . Asthma   . Depression   . Diabetes mellitus without complication ([place])   . GERD (gastroesophageal reflux disease)   . Hyperlipidemia   . Stroke [facility]) 2004   Past Surgical History:  Procedure Laterality Date  . ABDOMINAL HYSTERECTOMY    . BLADDER REPAIR    . BREAST BIOPSY    . CHOLECYSTECTOMY    . COLONOSCOPY  2005  . MR MRA CAROTID  02/2010   Minimal plaque formation; no significant stenosis. Sx: Syncope  . NASAL SINUS SURGERY     [facility]   Family History  Problem Relation Age of Onset  . Cancer Father   . Prostate cancer Father   . Heart attack Mother   . Hypertension Mother   . CAD Mother   . Brain cancer Daughter   . Hypertension Son   . Diabetes Son   . Lung cancer Brother   . Leukemia Brother   . Prostate cancer Brother    History  Sexual Activity  . Sexual activity: No    Outpatient Encounter Prescriptions as of 04/05/2016  Medication Sig  . albuterol (PROVENTIL HFA;VENTOLIN HFA) 108 (90 Base) MCG/ACT inhaler Inhale 2 puffs into the lungs every 4 (four) hours as needed for wheezing or shortness of breath.  . ALPRAZolam (XANAX) 0.5 MG tablet Take 1 tablet (0.5 mg total) by mouth 2 (two) times daily as needed for anxiety.  . clopidogrel (PLAVIX) 75 MG tablet Take 1 tablet (75 mg total) by mouth  daily.  . fexofenadine (ALLEGRA) 180 MG tablet Take 180 mg by mouth daily.  . fluticasone (FLONASE) 50 MCG/ACT nasal spray Place 2 sprays into both nostrils daily. (Patient taking differently: Place 2 sprays into both nostrils daily. )  . Multiple Vitamin (MULTIVITAMIN) capsule Take 1 capsule by mouth daily.  . pantoprazole (PROTONIX) 40 MG tablet Take 1 tablet (40 mg total) by mouth 2 (two) times daily before a meal.  . polyethylene glycol powder (GLYCOLAX/MIRALAX) powder Take 255 g (1 Container total) by mouth once. (Patient taking differently: Take 1 Container by mouth once. )  . vitamin B-12 (CYANOCOBALAMIN) 500 MCG tablet Take 500 mcg by mouth daily.  . benzonatate (TESSALON) 100 MG capsule Take 1 capsule (100 mg total) by mouth 3 (three) times daily as needed for cough. (Patient not taking: Reported on 04/05/2016)  . furosemide (LASIX) 20 MG tablet Take 1 tablet (20 mg total) by mouth daily. (Patient not taking: Reported on 01/24/2016)  . levofloxacin (LEVAQUIN) 500 MG tablet Take 1 tablet (500 mg total) by mouth daily. (Patient not taking: Reported on 04/05/2016)  . potassium chloride SA (K-DUR,KLOR-CON) 20 MEQ tablet Take 1 tablet (20 mEq total) by mouth daily. (Patient not taking: Reported on 01/24/2016)  .  predniSONE (STERAPRED UNI-PAK 21 TAB) 10 MG (21) TBPK tablet Take as directed on package instructions (Patient not taking: Reported on 04/05/2016)   No facility-administered encounter medications on file as of 04/05/2016.     Activities of Daily Living In your present state of health, do you have any difficulty performing the following activities: 04/05/2016  Hearing? N  Vision? Y  Difficulty concentrating or making decisions? N  Walking or climbing stairs? Y  Dressing or bathing? N  Doing errands, shopping? N  Preparing Food and eating ? N  Using the Toilet? N  In the past six months, have you accidently leaked urine? Y  Do you have problems with loss of bowel control? N  Managing your  Medications? N  Managing your Finances? N  Housekeeping or managing your Housekeeping? N  Some recent data might be hidden    Patient Care Team: Mar Daring, PA-C as PCP - General (Family Medicine) Robert Bellow, MD (General Surgery)    Assessment:     Exercise Activities and Dietary recommendations Current Exercise Habits: The patient does not participate in regular exercise at present (does not have time), Exercise limited by: None identified  Goals    . Increase water intake          Starting 04/05/16, I will continue to drink 6-8 glasses of water a day.      Fall Risk Fall Risk  04/05/2016 01/18/2015 09/06/2014  Falls in the past year? No No No   Depression Screen PHQ 2/9 Scores 04/05/2016 01/18/2015 09/06/2014  PHQ - 2 Score 0 0 0     Cognitive Function     6CIT Screen 04/05/2016  What Year? 0 points  What month? 0 points  What time? 0 points  Count back from 20 0 points  Months in reverse 0 points  Repeat phrase 0 points  Total Score 0    Immunization History  Administered Date(s) Administered  . Pneumococcal Conjugate-13 01/18/2015  . Pneumococcal Polysaccharide-23 12/10/2011   Screening Tests Health Maintenance  Topic Date Due  . FOOT EXAM  01/06/1951  . URINE MICROALBUMIN  01/06/1951  . MAMMOGRAM  07/27/2016 (Originally 07/20/2015)  . OPHTHALMOLOGY EXAM  07/27/2016 (Originally 01/06/1951)  . DEXA SCAN  02/26/2017 (Originally 01/07/2008)  . TETANUS/TDAP  02/26/2017 (Originally 01/06/1962)  . ZOSTAVAX  02/26/2026 (Originally 01/07/2003)  . HEMOGLOBIN A1C  06/11/2016  . COLONOSCOPY  09/09/2023  . INFLUENZA VACCINE  Completed  . PNA vac Low Risk Adult  Completed      Plan:  I have personally reviewed and addressed the Medicare Annual Wellness questionnaire and have noted the following in the patient's chart:  A. Medical and social history B. Use of alcohol, tobacco or illicit drugs  C. Current medications and supplements D. Functional  ability and status E.  Nutritional status F.  Physical activity G. Advance directives H. List of other physicians I.  Hospitalizations, surgeries, and ER visits in previous 12 months J.  [place] such as hearing and vision if needed, cognitive and depression L. Referrals and appointments - none  In addition, I have reviewed and discussed with patient certain preventive protocols, quality metrics, and best practice recommendations. A written personalized care plan for preventive services as well as general preventive health recommendations were provided to patient.  See attached scanned questionnaire for additional information.   Signed,  Fabio Neighbors, LPN Nurse Health Advisor   MD Recommendations: Follow up on diabetic foot exam and urine microalbumin lab today.  Pt to schedule mammogram and eye exam this year. Pt declined DEXA, tetanus and shingles vaccine.   I have reviewed the documentation and information obtained by Fabio Neighbors, LPN in the above chart and agree as above. I was available for consultation if any questions or issues arose.  Fenton Malling, PA-C

## 2016-04-05 NOTE — Patient Instructions (Signed)
Hydrochlorothiazide, HCTZ; Lisinopril tablets What is this medicine? HYDROCHLOROTHIAZIDE; LISINOPRIL (hye droe klor oh THYE a zide; lyse IN oh pril) is a combination of a diuretic and an ACE inhibitor. It is used to treat high blood pressure. This medicine may be used for other purposes; ask your health care provider or pharmacist if you have questions. COMMON BRAND NAME(S): Prinzide, Zestoretic What should I tell my health care provider before I take this medicine? They need to know if you have any of these conditions: -bone marrow disease -decreased urine -heart or blood vessel disease -if you are on a special diet like a low salt diet -immune system problems, like lupus -kidney disease -liver disease -previous swelling of the tongue, face, or lips with difficulty breathing, difficulty swallowing, hoarseness, or tightening of the throat -recent heart attack or stroke -an unusual or allergic reaction to lisinopril, hydrochlorothiazide, sulfa drugs, other medicines, insect venom, foods, dyes, or preservatives -pregnant or trying to get pregnant -breast-feeding How should I use this medicine? Take this medicine by mouth with a glass of water. Follow the directions on the prescription label. You can take it with or without food. If it upsets your stomach, take it with food. Take your medicine at regular intervals. Do not take it more often than directed. Do not stop taking except on your doctor's advice. Talk to your pediatrician regarding the use of this medicine in children. Special care may be needed. Overdosage: If you think you have taken too much of this medicine contact a poison control center or emergency room at once. NOTE: This medicine is only for you. Do not share this medicine with others. What if I miss a dose? If you miss a dose, take it as soon as you can. If it is almost time for your next dose, take only that dose. Do not take double or extra doses. What may interact with  this medicine? Do not take this medication with any of the following medications: -sacubitril; valsartan This medicine may also interact with the following: -barbiturates like phenobarbital -blood pressure medicines -corticosteroids like prednisone -diabetic medications -diuretics, especially triamterene, spironolactone or amiloride -lithium -NSAIDs, medicines for pain and inflammation, like ibuprofen or naproxen -potassium salts or potassium supplements -prescription pain medicines -skeletal muscle relaxants like tubocurarine -some cholesterol lowering medications like cholestyramine or colestipol This list may not describe all possible interactions. Give your health care provider a list of all the medicines, herbs, non-prescription drugs, or dietary supplements you use. Also tell them if you smoke, drink alcohol, or use illegal drugs. Some items may interact with your medicine. What should I watch for while using this medicine? Visit your doctor or health care professional for regular checks on your progress. Check your blood pressure as directed. Ask your doctor or health care professional what your blood pressure should be and when you should contact him or her. Call your doctor or health care professional if you notice an irregular or fast heart beat. You must not get dehydrated. Ask your doctor or health care professional how much fluid you need to drink a day. Check with him or her if you get an attack of severe diarrhea, nausea and vomiting, or if you sweat a lot. The loss of too much body fluid can make it dangerous for you to take this medicine. Women should inform their doctor if they wish to become pregnant or think they might be pregnant. There is a potential for serious side effects to an unborn child. Talk to  your health care professional or pharmacist for more information. You may get drowsy or dizzy. Do not drive, use machinery, or do anything that needs mental alertness until  you know how this drug affects you. Do not stand or sit up quickly, especially if you are an older patient. This reduces the risk of dizzy or fainting spells. Alcohol can make you more drowsy and dizzy. Avoid alcoholic drinks. This medicine may affect your blood sugar level. If you have diabetes, check with your doctor or health care professional before changing the dose of your diabetic medicine. Avoid salt substitutes unless you are told otherwise by your doctor or health care professional. This medicine can make you more sensitive to the sun. Keep out of the sun. If you cannot avoid being in the sun, wear protective clothing and use sunscreen. Do not use sun lamps or tanning beds/booths. Do not treat yourself for coughs, colds, or pain while you are taking this medicine without asking your doctor or health care professional for advice. Some ingredients may increase your blood pressure. What side effects may I notice from receiving this medicine? Side effects that you should report to your doctor or health care professional as soon as possible: -changes in vision -confusion, dizziness, light headedness or fainting spells -decreased amount of urine passed -difficulty breathing or swallowing, hoarseness, or tightening of the throat -eye pain -fast or irregular heart beat, palpitations, or chest pain -muscle cramps -nausea and vomiting -persistent dry cough -redness, blistering, peeling or loosening of the skin, including inside the mouth -stomach pain -swelling of your face, lips, tongue, hands, or feet -unusual rash, bleeding or bruising, or pinpoint red spots on the skin -worsened gout pain -yellowing of the eyes or skin Side effects that usually do not require medical attention (report to your doctor or health care professional if they continue or are bothersome): -change in sex drive or performance -cough -headache This list may not describe all possible side effects. Call your doctor  for medical advice about side effects. You may report side effects to FDA at 1-800-FDA-1088. Where should I keep my medicine? Keep out of the reach of children. Store at room temperature between 20 and 25 degrees C (68 and 77 degrees F). Protect from moisture and excessive light. Keep container tightly closed. Throw away any unused medicine after the expiration date. NOTE: This sheet is a summary. It may not cover all possible information. If you have questions about this medicine, talk to your doctor, pharmacist, or health care provider.  2017 Elsevier/Gold Standard (2015-04-08 11:42:20)

## 2016-04-05 NOTE — Progress Notes (Signed)
Patient: Norma Johns Female    DOB: 1940/04/21   76 y.o.   MRN: [id number] Visit Date: 04/05/2016  Today's Provider: Mar Daring, PA-C   Chief Complaint  Patient presents with  . Hypertension  . Diabetes  . Hyperlipidemia   Subjective:    HPI  Diabetes Mellitus Type II, Follow-up:   Lab Results  Component Value Date   HGBA1C 6.2 04/05/2016   HGBA1C 6.5 12/12/2015   HGBA1C 6.0 06/15/2015   Last seen for diabetes 4 months ago.  Management since then includes checking A1c 6.5%. She reports excellent compliance with treatment. She is not having side effects.  Current symptoms include none and have been unchanged. Home blood sugar records: not being checked  Episodes of hypoglycemia? no   Current Insulin Regimen:  Most Recent Eye Exam:  Weight trend: stable Prior visit with dietician: no Current diet: in general, a "healthy" diet   Current exercise: none  ------------------------------------------------------------------------   Hypertension, follow-up:  BP Readings from Last 3 Encounters:  04/05/16 (!) 142/80  01/24/16 (!) 152/70  12/12/15 120/78    She was last seen for hypertension 3 months ago.  BP at that visit was 120/78. Management since that visit includes check labs.She reports excellent compliance with treatment. She is not having side effects.  She is not exercising. She is adherent to low salt diet.   Outside blood pressures are stable. She is experiencing lower extremity edema.  Patient denies chest pain.   Cardiovascular risk factors include advanced age (older than 8 for men, 59 for women), diabetes mellitus, dyslipidemia and hypertension.  Use of agents associated with hypertension: none.   ------------------------------------------------------------------------    Lipid/Cholesterol, Follow-up:   Last seen for this 4 months ago.  Management since that visit includes check labs.  Last Lipid Panel:    Component  Value Date/Time   CHOL 224 (H) 12/13/2015 1039   CHOL 198 06/22/2013 1110   TRIG 118 12/13/2015 1039   TRIG 112 06/22/2013 1110   HDL 51 12/13/2015 1039   HDL 45 06/22/2013 1110   CHOLHDL 4.4 12/13/2015 1039   VLDL 24 12/13/2015 1039   VLDL 22 06/22/2013 1110   LDLCALC 149 (H) 12/13/2015 1039   LDLCALC 131 (H) 06/22/2013 1110    She reports excellent compliance with treatment. She is not having side effects.   Wt Readings from Last 3 Encounters:  04/05/16 173 lb 3.2 oz (78.6 kg)  01/24/16 171 lb 3.2 oz (77.7 kg)  12/12/15 173 lb (78.5 kg)  ------------------------------------------------------------------------    Allergies  Allergen Reactions  . Atorvastatin   . Minocycline Hcl Itching  . Niacin   . Penicillins Itching  . Septra [Sulfamethoxazole-Trimethoprim]   . Vioxx [Rofecoxib]      Current Outpatient Prescriptions:  .  albuterol (PROVENTIL HFA;VENTOLIN HFA) 108 (90 Base) MCG/ACT inhaler, Inhale 2 puffs into the lungs every 4 (four) hours as needed for wheezing or shortness of breath., Disp: 1 Inhaler, Rfl: 11 .  ALPRAZolam (XANAX) 0.5 MG tablet, Take 1 tablet (0.5 mg total) by mouth 2 (two) times daily as needed for anxiety., Disp: 30 tablet, Rfl: 5 .  clopidogrel (PLAVIX) 75 MG tablet, Take 1 tablet (75 mg total) by mouth daily., Disp: 30 tablet, Rfl: 5 .  fexofenadine (ALLEGRA) 180 MG tablet, Take 180 mg by mouth daily., Disp: , Rfl:  .  fluticasone (FLONASE) 50 MCG/ACT nasal spray, Place 2 sprays into both nostrils daily. (Patient taking differently: Place  2 sprays into both nostrils daily. ), Disp: 16 g, Rfl: 5 .  Multiple Vitamin (MULTIVITAMIN) capsule, Take 1 capsule by mouth daily., Disp: , Rfl:  .  pantoprazole (PROTONIX) 40 MG tablet, Take 1 tablet (40 mg total) by mouth 2 (two) times daily before a meal., Disp: 180 tablet, Rfl: 1 .  polyethylene glycol powder (GLYCOLAX/MIRALAX) powder, Take 255 g (1 Container total) by mouth once. (Patient taking  differently: Take 1 Container by mouth once. ), Disp: 255 g, Rfl: 0 .  vitamin B-12 (CYANOCOBALAMIN) 500 MCG tablet, Take 500 mcg by mouth daily., Disp: , Rfl:  .  furosemide (LASIX) 20 MG tablet, Take 1 tablet (20 mg total) by mouth daily. (Patient not taking: Reported on 01/24/2016), Disp: 30 tablet, Rfl: 0  Review of Systems  Constitutional: Negative.   Respiratory: Negative.   Cardiovascular: Negative.   Gastrointestinal: Negative.   Endocrine: Negative.   Genitourinary: Negative.   Musculoskeletal: Negative.   Neurological: Negative.     Social History  Substance Use Topics  . Smoking status: Never Smoker  . Smokeless tobacco: Never Used  . Alcohol use No   Objective:   There were no vitals taken for this visit.  Physical Exam  Constitutional: She appears well-developed and well-nourished. No distress.  Neck: Normal range of motion. Neck supple. No tracheal deviation present. No thyromegaly present.  Cardiovascular: Normal rate, regular rhythm and normal heart sounds.  Exam reveals no gallop and no friction rub.   No murmur heard. Pulmonary/Chest: Effort normal and breath sounds normal. No respiratory distress. She has no wheezes. She has no rales.  Musculoskeletal: She exhibits no edema.  Lymphadenopathy:    She has no cervical adenopathy.  Skin: She is not diaphoretic.  Vitals reviewed.  Diabetic Foot Exam - Simple   Simple Foot Form Diabetic Foot exam was performed with the following findings:  Yes 04/05/2016 11:30 AM  Visual Inspection No deformities, no ulcerations, no other skin breakdown bilaterally:  Yes Sensation Testing Intact to touch and monofilament testing bilaterally:  Yes Pulse Check Posterior Tibialis and Dorsalis pulse intact bilaterally:  Yes Comments        Assessment & Plan:     1. Type 2 diabetes mellitus without complication, without long-term current use of insulin (HCC) A1c improved to 6.2 from 6.5. Microalbumin was 20.  - POCT  glycosylated hemoglobin (Hb A1C) - POCT UA - Microalbumin  2. Benign hypertension Stable. Diagnosis pulled for medication refill. Continue current medical treatment plan.  - lisinopril-hydrochlorothiazide (PRINZIDE,ZESTORETIC) 10-12.5 MG tablet; Take 1 tablet by mouth daily.  Dispense: 30 tablet; Refill: 3  3. Hypercholesteremia Stable. Will check labs as below and f/u pending results.       Mar Daring, PA-C  [facility]

## 2016-04-05 NOTE — Patient Instructions (Signed)
Health Maintenance, Female Introduction Adopting a healthy lifestyle and getting preventive care can go a long way to promote health and wellness. Talk with your health care provider about what schedule of regular examinations is right for you. This is a good chance for you to check in with your provider about disease prevention and staying healthy. In between checkups, there are plenty of things you can do on your own. Experts have done a lot of research about which lifestyle changes and preventive measures are most likely to keep you healthy. Ask your health care provider for more information. Weight and diet Eat a healthy diet  Be sure to include plenty of vegetables, fruits, low-fat dairy products, and lean protein.  Do not eat a lot of foods high in solid fats, added sugars, or salt.  Get regular exercise. This is one of the most important things you can do for your health.  Most adults should exercise for at least 150 minutes each week. The exercise should increase your heart rate and make you sweat (moderate-intensity exercise).  Most adults should also do strengthening exercises at least twice a week. This is in addition to the moderate-intensity exercise. Maintain a healthy weight  Body mass index (BMI) is a measurement that can be used to identify possible weight problems. It estimates body fat based on height and weight. Your health care provider can help determine your BMI and help you achieve or maintain a healthy weight.  For females 4 years of age and older:  A BMI below 18.5 is considered underweight.  A BMI of 18.5 to 24.9 is normal.  A BMI of 25 to 29.9 is considered overweight.  A BMI of 30 and above is considered obese. Watch levels of cholesterol and blood lipids  You should start having your blood tested for lipids and cholesterol at 74 years of age, then have this test every 5 years.  You may need to have your cholesterol levels checked more often  if:  Your lipid or cholesterol levels are high.  You are older than 74 years of age.  You are at high risk for heart disease. Cancer screening Lung Cancer  Lung cancer screening is recommended for adults 12-31 years old who are at high risk for lung cancer because of a history of smoking.  A yearly low-dose CT scan of the lungs is recommended for people who:  Currently smoke.  Have quit within the past 15 years.  Have at least a 30-pack-year history of smoking. A pack year is smoking an average of one pack of cigarettes a day for 1 year.  Yearly screening should continue until it has been 15 years since you quit.  Yearly screening should stop if you develop a health problem that would prevent you from having lung cancer treatment. Breast Cancer  Practice breast self-awareness. This means understanding how your breasts normally appear and feel.  It also means doing regular breast self-exams. Let your health care provider know about any changes, no matter how small.  If you are in your 20s or 30s, you should have a clinical breast exam (CBE) by a health care provider every 1-3 years as part of a regular health exam.  If you are 74 or older, have a CBE every year. Also consider having a breast X-ray (mammogram) every year.  If you have a family history of breast cancer, talk to your health care provider about genetic screening.  If you are at high risk for breast cancer,  talk to your health care provider about having an MRI and a mammogram every year.  Breast cancer gene (BRCA) assessment is recommended for women who have family members with BRCA-related cancers. BRCA-related cancers include:  Breast.  Ovarian.  Tubal.  Peritoneal cancers.  Results of the assessment will determine the need for genetic counseling and BRCA1 and BRCA2 testing. Colorectal Cancer  This type of cancer can be detected and often prevented.  Routine colorectal cancer screening usually begins  at 74 years of age and continues through 75 years of age.  Your health care provider may recommend screening at an earlier age if you have risk factors for colon cancer.  Your health care provider may also recommend using home test kits to check for hidden blood in the stool.  A small camera at the end of a tube can be used to examine your colon directly (sigmoidoscopy or colonoscopy). This is done to check for the earliest forms of colorectal cancer.  Routine screening usually begins at age 50.  Direct examination of the colon should be repeated every 5-10 years through 75 years of age. However, you may need to be screened more often if early forms of precancerous polyps or small growths are found. Skin Cancer  Check your skin from head to toe regularly.  Tell your health care provider about any new moles or changes in moles, especially if there is a change in a mole's shape or color.  Also tell your health care provider if you have a mole that is larger than the size of a pencil eraser.  Always use sunscreen. Apply sunscreen liberally and repeatedly throughout the day.  Protect yourself by wearing long sleeves, pants, a wide-brimmed hat, and sunglasses whenever you are outside. Heart disease, diabetes, and high blood pressure  High blood pressure causes heart disease and increases the risk of stroke. High blood pressure is more likely to develop in:  People who have blood pressure in the high end of the normal range (130-139/85-89 mm Hg).  People who are overweight or obese.  People who are African American.  If you are 18-39 years of age, have your blood pressure checked every 3-5 years. If you are 40 years of age or older, have your blood pressure checked every year. You should have your blood pressure measured twice-once when you are at a hospital or clinic, and once when you are not at a hospital or clinic. Record the average of the two measurements. To check your blood pressure  when you are not at a hospital or clinic, you can use:  An automated blood pressure machine at a pharmacy.  A home blood pressure monitor.  If you are between 55 years and 79 years old, ask your health care provider if you should take aspirin to prevent strokes.  Have regular diabetes screenings. This involves taking a blood sample to check your fasting blood sugar level.  If you are at a normal weight and have a low risk for diabetes, have this test once every three years after 74 years of age.  If you are overweight and have a high risk for diabetes, consider being tested at a younger age or more often. Preventing infection Hepatitis B  If you have a higher risk for hepatitis B, you should be screened for this virus. You are considered at high risk for hepatitis B if:  You were born in a country where hepatitis B is common. Ask your health care provider which countries are   considered high risk.  Your parents were born in a high-risk country, and you have not been immunized against hepatitis B (hepatitis B vaccine).  You have HIV or AIDS.  You use needles to inject street drugs.  You live with someone who has hepatitis B.  You have had sex with someone who has hepatitis B.  You get hemodialysis treatment.  You take certain medicines for conditions, including cancer, organ transplantation, and autoimmune conditions. Hepatitis C  Blood testing is recommended for:  Everyone born from 1945 through 1965.  Anyone with known risk factors for hepatitis C. Osteoporosis and menopause  Osteoporosis is a disease in which the bones lose minerals and strength with aging. This can result in serious bone fractures. Your risk for osteoporosis can be identified using a bone density scan.  If you are 65 years of age or older, or if you are at risk for osteoporosis and fractures, ask your health care provider if you should be screened.  Ask your health care provider whether you should take  a calcium or vitamin D supplement to lower your risk for osteoporosis.  Menopause may have certain physical symptoms and risks.  Hormone replacement therapy may reduce some of these symptoms and risks. Talk to your health care provider about whether hormone replacement therapy is right for you. Follow these instructions at home:  Schedule regular health, dental, and eye exams.  Stay current with your immunizations.  Do not use any tobacco products including cigarettes, chewing tobacco, or electronic cigarettes.  If you are pregnant, do not drink alcohol.  If you are breastfeeding, limit how much and how often you drink alcohol.  Limit alcohol intake to no more than 1 drink per day for nonpregnant women. One drink equals 12 ounces of beer, 5 ounces of wine, or 1 ounces of hard liquor.  Do not use street drugs.  Do not share needles.  Ask your health care provider for help if you need support or information about quitting drugs.  Tell your health care provider if you often feel depressed.  Tell your health care provider if you have ever been abused or do not feel safe at home. This information is not intended to replace advice given to you by your health care provider. Make sure you discuss any questions you have with your health care provider. Document Released: 08/28/2010 Document Revised: 07/21/2015 Document Reviewed: 11/16/2014  2017 Elsevier  

## 2016-04-17 ENCOUNTER — Ambulatory Visit (INDEPENDENT_AMBULATORY_CARE_PROVIDER_SITE_OTHER): Payer: Medicare Other | Admitting: Family Medicine

## 2016-04-17 ENCOUNTER — Encounter: Payer: Self-pay | Admitting: Family Medicine

## 2016-04-17 ENCOUNTER — Other Ambulatory Visit
Admission: RE | Admit: 2016-04-17 | Discharge: 2016-04-17 | Disposition: A | Discharge status: Home / Self Care | Payer: Medicare Other | Source: Ambulatory Visit | Attending: Family Medicine | Admitting: Family Medicine

## 2016-04-17 VITALS — BP 104/64 | Temp 98.1°F | Resp 16 | Wt 171.0 lb

## 2016-04-17 DIAGNOSIS — I6930 Unspecified sequelae of cerebral infarction: Secondary | ICD-10-CM | POA: Insufficient documentation

## 2016-04-17 DIAGNOSIS — M791 Myalgia, unspecified site: Secondary | ICD-10-CM

## 2016-04-17 DIAGNOSIS — Z881 Allergy status to other antibiotic agents status: Secondary | ICD-10-CM | POA: Diagnosis not present

## 2016-04-17 DIAGNOSIS — Z79899 Other long term (current) drug therapy: Secondary | ICD-10-CM | POA: Diagnosis not present

## 2016-04-17 DIAGNOSIS — R52 Pain, unspecified: Secondary | ICD-10-CM

## 2016-04-17 DIAGNOSIS — Z7951 Long term (current) use of inhaled steroids: Secondary | ICD-10-CM | POA: Diagnosis not present

## 2016-04-17 DIAGNOSIS — Z880 Allergy status to penicillin: Secondary | ICD-10-CM | POA: Diagnosis not present

## 2016-04-17 DIAGNOSIS — Z882 Allergy status to sulfonamides status: Secondary | ICD-10-CM | POA: Diagnosis not present

## 2016-04-17 DIAGNOSIS — Z888 Allergy status to other drugs, medicaments and biological substances status: Secondary | ICD-10-CM | POA: Insufficient documentation

## 2016-04-17 LAB — COMPREHENSIVE METABOLIC PANEL
ALBUMIN: 4.6 g/dL (ref 3.5–5.0)
ALK PHOS: 72 U/L (ref 38–126)
ALT: 13 U/L — ABNORMAL LOW (ref 14–54)
ANION GAP: 5 (ref 5–15)
AST: 18 U/L (ref 15–41)
BILIRUBIN TOTAL: 0.4 mg/dL (ref 0.3–1.2)
BUN: 16 mg/dL (ref 6–20)
CALCIUM: 9.8 mg/dL (ref 8.9–10.3)
CO2: 27 mmol/L (ref 22–32)
Chloride: 104 mmol/L (ref 101–111)
Creatinine, Ser: 0.71 mg/dL (ref 0.44–1.00)
GFR calc Af Amer: >60 mL/min (ref >60)
GFR calc non Af Amer: >60 mL/min (ref >60)
GLUCOSE: 132 mg/dL — AB (ref 65–99)
Potassium: 3.7 mmol/L (ref 3.5–5.1)
Sodium: 136 mmol/L (ref 135–145)
TOTAL PROTEIN: 7.4 g/dL (ref 6.5–8.1)

## 2016-04-17 LAB — POCT INFLUENZA A/B
Influenza A, POC: NEGATIVE
Influenza B, POC: NEGATIVE

## 2016-04-17 LAB — CBC
HCT: 40.3 % (ref 35.0–47.0)
Hemoglobin: 13.5 g/dL (ref 12.0–16.0)
MCH: 30.2 pg (ref 26.0–34.0)
MCHC: 33.5 g/dL (ref 32.0–36.0)
MCV: 90.1 fL (ref 80.0–100.0)
PLATELETS: 293 10*3/uL (ref 150–440)
RBC: 4.48 MIL/uL (ref 3.80–5.20)
RDW: 13.5 % (ref 11.5–14.5)
WBC: 8.4 10*3/uL (ref 3.6–11.0)

## 2016-04-17 LAB — POCT URINALYSIS DIPSTICK
Bilirubin, UA: NEGATIVE
Glucose, UA: NEGATIVE
Ketones, UA: NEGATIVE
Nitrite, UA: NEGATIVE
PROTEIN UA: NEGATIVE
Spec Grav, UA: 1.015
UROBILINOGEN UA: 0.2
pH, UA: 6

## 2016-04-17 LAB — CK: CK TOTAL: 40 U/L (ref 38–234)

## 2016-04-17 LAB — SEDIMENTATION RATE: Sed Rate: 4 mm/hr (ref 0–30)

## 2016-04-17 MED ORDER — ASPIRIN EC 81 MG PO TBEC: 81.0000 mg | DELAYED_RELEASE_TABLET | Freq: Every day | ORAL | 0 refills | Status: DC

## 2016-04-17 MED ORDER — NAPROXEN 500 MG PO TABS: 500.0000 mg | ORAL_TABLET | Freq: Two times a day (BID) | ORAL | 0 refills | Status: AC

## 2016-04-17 NOTE — Progress Notes (Signed)
Patient: Norma Johns Female    DOB: 04/03/1940   76 y.o.   MRN: [id number] Visit Date: 04/17/2016  Today's Provider: Lelon Huh, MD   Chief Complaint  Patient presents with  . Generalized Body Aches   Subjective:    Patient stated that she has had body aches for over 1 week. Pain seem to worsen 3 days ago. Patient has slight nasal congestion and dry cough. Patient saw Fenton Malling 04/05/2016 and had some fluid in her right. Patient states pain is in her arms, legs, lower back, and abdomin. Patient stated that she has been sweating profusely. No fever. Patient has taken otc tylenol with no releif.   Was started back on lisinopril-hctz on 04-06-2015   BP Readings from Last 3 Encounters:  04/17/16 104/64  04/05/16 (!) 142/80  01/24/16 (!) 152/70        Allergies  Allergen Reactions  . Atorvastatin   . Minocycline Hcl Itching  . Niacin   . Penicillins Itching  . Septra [Sulfamethoxazole-Trimethoprim]   . Vioxx [Rofecoxib]      Current Outpatient Prescriptions:  .  albuterol (PROVENTIL HFA;VENTOLIN HFA) 108 (90 Base) MCG/ACT inhaler, Inhale 2 puffs into the lungs every 4 (four) hours as needed for wheezing or shortness of breath., Disp: 1 Inhaler, Rfl: 11 .  clopidogrel (PLAVIX) 75 MG tablet, Take 1 tablet (75 mg total) by mouth daily., Disp: 30 tablet, Rfl: 5 .  fexofenadine (ALLEGRA) 180 MG tablet, Take 180 mg by mouth daily., Disp: , Rfl:  .  fluticasone (FLONASE) 50 MCG/ACT nasal spray, Place 2 sprays into both nostrils daily. (Patient taking differently: Place 2 sprays into both nostrils daily. ), Disp: 16 g, Rfl: 5 .  lisinopril-hydrochlorothiazide (PRINZIDE,ZESTORETIC) 10-12.5 MG tablet, Take 1 tablet by mouth daily., Disp: 30 tablet, Rfl: 3 .  Multiple Vitamin (MULTIVITAMIN) capsule, Take 1 capsule by mouth daily., Disp: , Rfl:  .  pantoprazole (PROTONIX) 40 MG tablet, Take 1 tablet (40 mg total) by mouth 2 (two) times daily before a meal., Disp: 180  tablet, Rfl: 1 .  polyethylene glycol powder (GLYCOLAX/MIRALAX) powder, Take 255 g (1 Container total) by mouth once. (Patient taking differently: Take 1 Container by mouth once. ), Disp: 255 g, Rfl: 0 .  vitamin B-12 (CYANOCOBALAMIN) 500 MCG tablet, Take 500 mcg by mouth daily., Disp: , Rfl:   Review of Systems  Constitutional: Positive for diaphoresis. Negative for appetite change, chills, fatigue and fever.  HENT: Positive for postnasal drip.        Right ear congested  Respiratory: Positive for cough. Negative for chest tightness and shortness of breath.   Cardiovascular: Negative for chest pain and palpitations.  Gastrointestinal: Negative for abdominal pain, nausea and vomiting.  Neurological: Negative for dizziness and weakness.    Social History  Substance Use Topics  . Smoking status: Never Smoker  . Smokeless tobacco: Never Used  . Alcohol use No   Objective:   BP 104/64 (BP Location: Right Arm, Patient Position: Sitting, Cuff Size: Large)   Temp 98.1 F (36.7 C) (Oral)   Resp 16   Wt 171 lb (77.6 kg)   BMI 29.35 kg/m   Physical Exam   General Appearance:    Alert, cooperative, no distress  Eyes:    PERRL, conjunctiva/corneas clear, EOM's intact       Lungs:     Clear to auscultation bilaterally, respirations unlabored  Heart:    Regular rate and rhythm  Neurologic:  Awake, alert, oriented x 3. No apparent focal neurological           defect.   MS:   Diffuse bilateral and symmetric muscle tenderness of back, legs, arms and shoulders. No significant joint or spine tenderness. No gross deformities. Normal muscle strength.    Results for orders placed or performed in visit on 04/17/16  POCT Influenza A/B  Result Value Ref Range   Influenza A, POC Negative Negative   Influenza B, POC Negative Negative  POCT urinalysis dipstick  Result Value Ref Range   Color, UA Yellow    Clarity, UA Slightly Cloudy    Glucose, UA Neg    Bilirubin, UA Neg    Ketones, UA Neg      Spec Grav, UA 1.015    Blood, UA Small    pH, UA 6.0    Protein, UA Neg    Urobilinogen, UA 0.2    Nitrite, UA Neg    Leukocytes, UA Trace (A) Negative        Assessment & Plan:     1. Body aches  - POCT Influenza A/B - POCT urinalysis dipstick - CULTURE, URINE COMPREHENSIVE  2. Myalgia Abrupt onset about 10 days after starting on lisinopril-hctz, which she took in the past without adverse effects. Stop lisinopril-hctz for the time being. Check labs. Stop Plavix as below and start NSAID.  - CULTURE, URINE COMPREHENSIVE - naproxen (NAPROSYN) 500 MG tablet; Take 1 tablet (500 mg total) by mouth 2 (two) times daily with a meal. DISCONTINUE PLAVIX  Dispense: 30 tablet; Refill: 0 - Comprehensive metabolic panel - CBC - Sed Rate (ESR) - ANA w/Reflex if Positive - CK (Creatine Kinase)  3. History of CVA with residual deficit She states she was started on ASA and Plavix after CVA many years ago. She was no on ASA prior to CVA. State she has never had any adverse reaction to ASA, but has been no monotherapy for Plavix alone for a few years.She would like to stop Plavix. She has not taken it today. Will change today to 81 mg ECASA.        Lelon Huh, MD  [facility]

## 2016-04-17 NOTE — Patient Instructions (Signed)
STOP lisinopril-hctz until further notice.

## 2016-04-18 LAB — URINE CULTURE: CULTURE: NO GROWTH

## 2016-04-18 LAB — ANA W/REFLEX IF POSITIVE: Anti Nuclear Antibody(ANA): NEGATIVE

## 2016-04-19 ENCOUNTER — Telehealth: Payer: Self-pay

## 2016-04-19 LAB — CULTURE, URINE COMPREHENSIVE

## 2016-04-19 MED ORDER — PREDNISONE 20 MG PO TABS: 20.0000 mg | ORAL_TABLET | Freq: Two times a day (BID) | ORAL | 0 refills | Status: AC

## 2016-04-19 NOTE — Telephone Encounter (Signed)
Advised patient as below. Sent in medication into the patient's pharmacy.

## 2016-04-19 NOTE — Telephone Encounter (Signed)
Can d/c naproxyn and start prednisone 20mg  one tablet twice a day for 7 days.

## 2016-04-19 NOTE — Telephone Encounter (Signed)
-----   Message from Birdie Sons, MD sent at 04/18/2016  1:50 PM EST ----- Labs are all completely normal. Continue on naproxen that was sent to pharmacy yesterday. Call if not much better over the weekend.

## 2016-04-19 NOTE — Telephone Encounter (Signed)
Patient was notified. Expressed understanding.  

## 2016-04-19 NOTE — Telephone Encounter (Signed)
Advised patient of results. Patient reports that she can not take the Naproxen. She reports that it gives her severe indigestion. She wanted to know if there is anything else she can take for her body aches? Please advise. Thanks!

## 2016-06-12 ENCOUNTER — Ambulatory Visit (INDEPENDENT_AMBULATORY_CARE_PROVIDER_SITE_OTHER): Payer: Medicare Other | Admitting: Physician Assistant

## 2016-06-12 ENCOUNTER — Encounter: Payer: Self-pay | Admitting: Physician Assistant

## 2016-06-12 VITALS — BP 140/80 | HR 64 | Temp 98.0°F | Resp 16 | Wt 173.6 lb

## 2016-06-12 DIAGNOSIS — F419 Anxiety disorder, unspecified: Secondary | ICD-10-CM

## 2016-06-12 DIAGNOSIS — L239 Allergic contact dermatitis, unspecified cause: Secondary | ICD-10-CM | POA: Diagnosis not present

## 2016-06-12 DIAGNOSIS — G479 Sleep disorder, unspecified: Secondary | ICD-10-CM | POA: Diagnosis not present

## 2016-06-12 MED ORDER — PREDNISONE 10 MG (21) PO TBPK: ORAL_TABLET | ORAL | 0 refills | Status: DC

## 2016-06-12 MED ORDER — ALPRAZOLAM 0.5 MG PO TABS: 0.5000 mg | ORAL_TABLET | Freq: Every evening | ORAL | 5 refills | Status: DC | PRN

## 2016-06-12 MED ORDER — ESCITALOPRAM OXALATE 10 MG PO TABS: ORAL_TABLET | ORAL | 1 refills | Status: DC

## 2016-06-12 NOTE — Progress Notes (Signed)
Patient: Norma Johns Female    DOB: February 04, 1941   76 y.o.   MRN: [id number] Visit Date: 06/12/2016  Today's Provider: Mar Daring, PA-C   Chief Complaint  Patient presents with  . Anxiety   Subjective:    HPI Anxiety: Patient complains of sleep disturbance and fatigue.  She has the following symptoms: chest pain, fatigue, insomnia, paresthesias, psychomotor agitation, racing thoughts. Onset of symptoms was approximately several years ago, gradually worsening in the last month. She denies current suicidal and homicidal ideation. Previous treatment includes Xanax.  She complains of the following side effects from the treatment: none. Patient reports that she has been having to take care of her husband and her brother. Patient reports that she has been off Xanax times several months du to no refills. Patient reports that while she was taking Xanax she was able to relax. She has also been on Lexapro in the past. She reports that recently she has been a primary caregiver for her brother that is in failing health. He lives by himself in [place]. He is under Hospice care. She reports that the hospice nurse and social worker have given her great support as well.   She also has a new rash around her neck. She denies any new detergents, soaps or lotions. She denies wearing any necklace that may have caused a metal allergic reaction. Possible exposure from something outdoors as her grandkids have been around her and they play outside then will come in and sit on her lap. She denies any spreading or drainage of the rash. Rash is pruritic.     Allergies  Allergen Reactions  . Atorvastatin   . Minocycline Hcl Itching  . Naproxen     Indigestion  . Niacin   . Penicillins Itching  . Septra [Sulfamethoxazole-Trimethoprim]   . Vioxx [Rofecoxib]      Current Outpatient Prescriptions:  .  albuterol (PROVENTIL HFA;VENTOLIN HFA) 108 (90 Base) MCG/ACT inhaler, Inhale 2 puffs into the  lungs every 4 (four) hours as needed for wheezing or shortness of breath., Disp: 1 Inhaler, Rfl: 11 .  aspirin EC 81 MG tablet, Take 1 tablet (81 mg total) by mouth daily., Disp: 1 tablet, Rfl: 0 .  fexofenadine (ALLEGRA) 180 MG tablet, Take 180 mg by mouth daily., Disp: , Rfl:  .  fluticasone (FLONASE) 50 MCG/ACT nasal spray, Place 2 sprays into both nostrils daily. (Patient taking differently: Place 2 sprays into both nostrils daily. ), Disp: 16 g, Rfl: 5 .  Multiple Vitamin (MULTIVITAMIN) capsule, Take 1 capsule by mouth daily., Disp: , Rfl:  .  pantoprazole (PROTONIX) 40 MG tablet, Take 1 tablet (40 mg total) by mouth 2 (two) times daily before a meal., Disp: 180 tablet, Rfl: 1 .  polyethylene glycol powder (GLYCOLAX/MIRALAX) powder, Take 255 g (1 Container total) by mouth once. (Patient taking differently: Take 1 Container by mouth once. ), Disp: 255 g, Rfl: 0 .  vitamin B-12 (CYANOCOBALAMIN) 500 MCG tablet, Take 500 mcg by mouth daily., Disp: , Rfl:   Review of Systems  Constitutional: Positive for activity change and fatigue.  Respiratory: Negative.   Cardiovascular: Negative.   Gastrointestinal: Negative.   Neurological: Positive for numbness.  Psychiatric/Behavioral: Positive for dysphoric mood and sleep disturbance. The patient is nervous/anxious.     Social History  Substance Use Topics  . Smoking status: Never Smoker  . Smokeless tobacco: Never Used  . Alcohol use No   Objective:   BP  140/80 (BP Location: Left Arm, Patient Position: Sitting, Cuff Size: Large)   Pulse 64   Temp 98 F (36.7 C) (Oral)   Resp 16   Wt 173 lb 9.6 oz (78.7 kg)   SpO2 98%   BMI 29.80 kg/m  Vitals:   06/12/16 1056  BP: 140/80  Pulse: 64  Resp: 16  Temp: 98 F (36.7 C)  TempSrc: Oral  SpO2: 98%  Weight: 173 lb 9.6 oz (78.7 kg)     Physical Exam  Constitutional: She appears well-developed and well-nourished. No distress.  Neck: Normal range of motion. Neck supple. No tracheal  deviation present. No thyromegaly present.  Cardiovascular: Normal rate, regular rhythm and normal heart sounds.  Exam reveals no gallop and no friction rub.   No murmur heard. Pulmonary/Chest: Effort normal and breath sounds normal. No respiratory distress. She has no wheezes. She has no rales.  Lymphadenopathy:    She has no cervical adenopathy.  Skin: Rash noted. Rash is maculopapular. She is not diaphoretic.     Psychiatric: Her speech is normal and behavior is normal. Judgment and thought content normal. Her mood appears anxious. Cognition and memory are normal. She exhibits a depressed mood.  Vitals reviewed.      Assessment & Plan:     1. Allergic contact dermatitis, unspecified trigger Suspect some allergic irritant as rash seems consistent with a dermatitis. Will give prednisone taper as below. She is to call if no improvement. - predniSONE (STERAPRED UNI-PAK 21 TAB) 10 MG (21) TBPK tablet; 6 day taper; take as directed on package intructions  Dispense: 21 tablet; Refill: 0  2. Anxiety Worsening due to acute situation with her brother's health declining. Will give xanax and lexapro as below. I will see her back in 4 weeks. She is to call if symptoms worsen in the meantime.  - ALPRAZolam (XANAX) 0.5 MG tablet; Take 1 tablet (0.5 mg total) by mouth at bedtime as needed for anxiety.  Dispense: 30 tablet; Refill: 5 - escitalopram (LEXAPRO) 10 MG tablet; Take 1/2 tab PO q hs x 1 week, then increase to 1 tab PO q hs  Dispense: 30 tablet; Refill: 1  3. Difficulty sleeping See above medical treatment plan. - ALPRAZolam (XANAX) 0.5 MG tablet; Take 1 tablet (0.5 mg total) by mouth at bedtime as needed for anxiety.  Dispense: 30 tablet; Refill: 5 - escitalopram (LEXAPRO) 10 MG tablet; Take 1/2 tab PO q hs x 1 week, then increase to 1 tab PO q hs  Dispense: 30 tablet; Refill: [place], PA-C  [facility]

## 2016-06-12 NOTE — Patient Instructions (Signed)
10 Relaxation Techniques That Zap Stress Fast By Jeannette Moninger   Listen  Relax. You deserve it, it's good for you, and it takes less time than you think. You don't need a spa weekend or a retreat. Each of these stress-relieving tips can get you from OMG to om in less than 15 minutes. 1. Meditate  A few minutes of practice per day can help ease anxiety. "Research suggests that daily meditation may alter the brain's neural pathways, making you more resilient to stress," says psychologist Robbie Maller Hartman, PhD, a [place] health and wellness coach. It's simple. Sit up straight with both feet on the floor. Close your eyes. Focus your attention on reciting -- out loud or silently -- a positive mantra such as "I feel at peace" or "I love myself." Place one hand on your belly to sync the mantra with your breaths. Let any distracting thoughts float by like clouds. 2. Breathe Deeply  Take a 5-minute break and focus on your breathing. Sit up straight, eyes closed, with a hand on your belly. Slowly inhale through your nose, feeling the breath start in your abdomen and work its way to the top of your head. Reverse the process as you exhale through your mouth.  "Deep breathing counters the effects of stress by slowing the heart rate and lowering blood pressure," psychologist Judith Tutin, PhD, says. She's a certified life coach in [place], [place] 3. Be Present  Slow down.  "Take 5 minutes and focus on only one behavior with awareness," Tutin says. Notice how the air feels on your face when you're walking and how your feet feel hitting the ground. Enjoy the texture and taste of each bite of food. When you spend time in the moment and focus on your senses, you should feel less tense. 4. Reach Out  Your social network is one of your best tools for handling stress. Talk to others -- preferably face to face, or at least on the phone. Share what's going on. You can get a fresh perspective while keeping your  connection strong. 5. Tune In to Your Body  Mentally scan your body to get a sense of how stress affects it each day. Lie on your back, or sit with your feet on the floor. Start at your toes and work your way up to your scalp, noticing how your body feels.  10 Relaxation Techniques That Zap Stress Fast By Jeannette Moninger   Listen  "Simply be aware of places you feel tight or loose without trying to change anything," Tutin says. For 1 to 2 minutes, imagine each deep breath flowing to that body part. Repeat this process as you move your focus up your body, paying close attention to sensations you feel in each body part. 6. Decompress  Place a warm heat wrap around your neck and shoulders for 10 minutes. Close your eyes and relax your face, neck, upper chest, and back muscles. Remove the wrap, and use a tennis ball or foam roller to massage away tension.  "Place the ball between your back and the wall. Lean into the ball, and hold gentle pressure for up to 15 seconds. Then move the ball to another spot, and apply pressure," says Cathy Benninger, a nurse practitioner and assistant professor at [facility] [employer] [facility] in [place]. 7. Laugh Out Loud  A good belly laugh doesn't just lighten the load mentally. It lowers cortisol, your body's stress hormone, and boosts brain chemicals called endorphins, which help   your mood. Lighten up by tuning in to your favorite sitcom or video, reading the comics, or chatting with someone who makes you smile. 8. Crank Up the Tunes  Research shows that listening to soothing music can lower blood pressure, heart rate, and anxiety. "Create a playlist of songs or nature sounds (the ocean, a bubbling brook, birds chirping), and allow your mind to focus on the different melodies, instruments, or singers in the piece," Benninger says. You also can blow off steam by rocking out to more upbeat tunes -- or singing at the top of your lungs! 9. Get Moving   You don't have to run in order to get a runner's high. All forms of exercise, including yoga and walking, can ease depression and anxiety by helping the brain release feel-good chemicals and by giving your body a chance to practice dealing with stress. You can go for a quick walk around the block, take the stairs up and down a few flights, or do some stretching exercises like head rolls and shoulder shrugs. 10. Be Grateful  Keep a gratitude journal or several (one by your bed, one in your purse, and one at work) to help you remember all the things that are good in your life.  "Being grateful for your blessings cancels out negative thoughts and worries," says Joni Emmerling, a wellness coach in [place], [place].  Use these journals to savor good experiences like a child's smile, a sunshine-filled day, and good health. Don't forget to celebrate accomplishments like mastering a new task at work or a new hobby. When you start feeling stressed, spend a few minutes looking through your notes to remind yourself what really matters.  

## 2016-06-27 ENCOUNTER — Ambulatory Visit
Admission: RE | Admit: 2016-06-27 | Discharge: 2016-06-27 | Disposition: A | Discharge status: Home / Self Care | Payer: Medicare Other | Source: Ambulatory Visit | Attending: Physician Assistant | Admitting: Physician Assistant

## 2016-06-27 DIAGNOSIS — Z1231 Encounter for screening mammogram for malignant neoplasm of breast: Secondary | ICD-10-CM | POA: Diagnosis not present

## 2016-06-27 DIAGNOSIS — Z1239 Encounter for other screening for malignant neoplasm of breast: Secondary | ICD-10-CM

## 2016-06-28 ENCOUNTER — Telehealth: Payer: Self-pay

## 2016-06-28 ENCOUNTER — Other Ambulatory Visit: Payer: Self-pay | Admitting: Physician Assistant

## 2016-06-28 DIAGNOSIS — N6320 Unspecified lump in the left breast, unspecified quadrant: Secondary | ICD-10-CM

## 2016-06-28 DIAGNOSIS — R928 Other abnormal and inconclusive findings on diagnostic imaging of breast: Secondary | ICD-10-CM

## 2016-06-28 NOTE — Telephone Encounter (Signed)
Patient advised as directed below.  Thanks,  -Joseline 

## 2016-06-28 NOTE — Telephone Encounter (Signed)
-----   Message from Mar Daring, [place] sent at 06/28/2016  8:34 AM EDT ----- Mammogram was inconclusive in the left breast. You should be contacted by [facility], if you haven't already been, to schedule a diagnostic mammogram and US of the left breast. Orders were signed this morning.

## 2016-07-05 ENCOUNTER — Ambulatory Visit (INDEPENDENT_AMBULATORY_CARE_PROVIDER_SITE_OTHER): Payer: Medicare Other | Admitting: Physician Assistant

## 2016-07-05 ENCOUNTER — Encounter: Payer: Self-pay | Admitting: Physician Assistant

## 2016-07-05 ENCOUNTER — Other Ambulatory Visit
Admission: RE | Admit: 2016-07-05 | Discharge: 2016-07-05 | Disposition: A | Discharge status: Home / Self Care | Payer: Medicare Other | Source: Ambulatory Visit | Attending: Physician Assistant | Admitting: Physician Assistant

## 2016-07-05 VITALS — BP 140/70 | HR 69 | Temp 98.1°F | Resp 16 | Wt 172.4 lb

## 2016-07-05 DIAGNOSIS — E119 Type 2 diabetes mellitus without complications: Secondary | ICD-10-CM

## 2016-07-05 DIAGNOSIS — E7800 Pure hypercholesterolemia, unspecified: Secondary | ICD-10-CM

## 2016-07-05 DIAGNOSIS — I10 Essential (primary) hypertension: Secondary | ICD-10-CM

## 2016-07-05 DIAGNOSIS — F419 Anxiety disorder, unspecified: Secondary | ICD-10-CM | POA: Insufficient documentation

## 2016-07-05 LAB — COMPREHENSIVE METABOLIC PANEL
ALBUMIN: 4.1 g/dL (ref 3.5–5.0)
ALT: 14 U/L (ref 14–54)
AST: 19 U/L (ref 15–41)
Alkaline Phosphatase: 74 U/L (ref 38–126)
Anion gap: 4 — ABNORMAL LOW (ref 5–15)
BUN: 12 mg/dL (ref 6–20)
CALCIUM: 9.7 mg/dL (ref 8.9–10.3)
CHLORIDE: 108 mmol/L (ref 101–111)
CO2: 27 mmol/L (ref 22–32)
Creatinine, Ser: 0.63 mg/dL (ref 0.44–1.00)
GFR calc Af Amer: >60 mL/min (ref >60)
GLUCOSE: 124 mg/dL — AB (ref 65–99)
POTASSIUM: 3.9 mmol/L (ref 3.5–5.1)
Sodium: 139 mmol/L (ref 135–145)
TOTAL PROTEIN: 7 g/dL (ref 6.5–8.1)
Total Bilirubin: 1.3 mg/dL — ABNORMAL HIGH (ref 0.3–1.2)

## 2016-07-05 LAB — CBC WITH DIFFERENTIAL/PLATELET
BASOS ABS: 0.1 10*3/uL (ref 0–0.1)
BASOS PCT: 1 %
EOS PCT: 2 %
Eosinophils Absolute: 0.1 10*3/uL (ref 0–0.7)
HCT: 38.6 % (ref 35.0–47.0)
Hemoglobin: 13.3 g/dL (ref 12.0–16.0)
LYMPHS PCT: 35 %
Lymphs Abs: 2.2 10*3/uL (ref 1.0–3.6)
MCH: 30.5 pg (ref 26.0–34.0)
MCHC: 34.3 g/dL (ref 32.0–36.0)
MCV: 88.8 fL (ref 80.0–100.0)
Monocytes Absolute: 0.5 10*3/uL (ref 0.2–0.9)
Monocytes Relative: 8 %
NEUTROS ABS: 3.4 10*3/uL (ref 1.4–6.5)
Neutrophils Relative %: 54 %
PLATELETS: 274 10*3/uL (ref 150–440)
RBC: 4.35 MIL/uL (ref 3.80–5.20)
RDW: 14.1 % (ref 11.5–14.5)
WBC: 6.3 10*3/uL (ref 3.6–11.0)

## 2016-07-05 LAB — LIPID PANEL
CHOL/HDL RATIO: 4.3 ratio
CHOLESTEROL: 205 mg/dL — AB (ref 0–200)
HDL: 48 mg/dL (ref >40)
LDL Cholesterol: 136 mg/dL — ABNORMAL HIGH (ref 0–99)
TRIGLYCERIDES: 105 mg/dL (ref <150)
VLDL: 21 mg/dL (ref 0–40)

## 2016-07-05 LAB — TSH: TSH: 0.566 u[IU]/mL (ref 0.350–4.500)

## 2016-07-05 NOTE — Patient Instructions (Signed)
DASH Eating Plan DASH stands for "Dietary Approaches to Stop Hypertension." The DASH eating plan is a healthy eating plan that has been shown to reduce high blood pressure (hypertension). It may also reduce your risk for type 2 diabetes, heart disease, and stroke. The DASH eating plan may also help with weight loss. What are tips for following this plan? General guidelines  Avoid eating more than 2,300 mg (milligrams) of salt (sodium) a day. If you have hypertension, you may need to reduce your sodium intake to 1,500 mg a day.  Limit alcohol intake to no more than 1 drink a day for nonpregnant women and 2 drinks a day for men. One drink equals 12 oz of beer, 5 oz of wine, or 1 oz of hard liquor.  Work with your health care provider to maintain a healthy body weight or to lose weight. Ask what an ideal weight is for you.  Get at least 30 minutes of exercise that causes your heart to beat faster (aerobic exercise) most days of the week. Activities may include walking, swimming, or biking.  Work with your health care provider or diet and nutrition specialist (dietitian) to adjust your eating plan to your individual calorie needs. Reading food labels  Check food labels for the amount of sodium per serving. Choose foods with less than 5 percent of the Daily Value of sodium. Generally, foods with less than 300 mg of sodium per serving fit into this eating plan.  To find whole grains, look for the word "whole" as the first word in the ingredient list. Shopping  Buy products labeled as "low-sodium" or "no salt added."  Buy fresh foods. Avoid canned foods and premade or frozen meals. Cooking  Avoid adding salt when cooking. Use salt-free seasonings or herbs instead of table salt or sea salt. Check with your health care provider or pharmacist before using salt substitutes.  Do not fry foods. Cook foods using healthy methods such as baking, boiling, grilling, and broiling instead.  Cook with  heart-healthy oils, such as olive, canola, soybean, or sunflower oil. Meal planning   Eat a balanced diet that includes: ? 5 or more servings of fruits and vegetables each day. At each meal, try to fill half of your plate with fruits and vegetables. ? Up to 6-8 servings of whole grains each day. ? Less than 6 oz of lean meat, poultry, or fish each day. A 3-oz serving of meat is about the same size as a deck of cards. One egg equals 1 oz. ? 2 servings of low-fat dairy each day. ? A serving of nuts, seeds, or beans 5 times each week. ? Heart-healthy fats. Healthy fats called Omega-3 fatty acids are found in foods such as flaxseeds and coldwater fish, like sardines, salmon, and mackerel.  Limit how much you eat of the following: ? Canned or prepackaged foods. ? Food that is high in trans fat, such as fried foods. ? Food that is high in saturated fat, such as fatty meat. ? Sweets, desserts, sugary drinks, and other foods with added sugar. ? Full-fat dairy products.  Do not salt foods before eating.  Try to eat at least 2 vegetarian meals each week.  Eat more home-cooked food and less restaurant, buffet, and fast food.  When eating at a restaurant, ask that your food be prepared with less salt or no salt, if possible. What foods are recommended? The items listed may not be a complete list. Talk with your dietitian about what   dietary choices are best for you. Grains Whole-grain or whole-wheat bread. Whole-grain or whole-wheat pasta. Brown rice. Oatmeal. Quinoa. Bulgur. Whole-grain and low-sodium cereals. Pita bread. Low-fat, low-sodium crackers. Whole-wheat flour tortillas. Vegetables Fresh or frozen vegetables (raw, steamed, roasted, or grilled). Low-sodium or reduced-sodium tomato and vegetable juice. Low-sodium or reduced-sodium tomato sauce and tomato paste. Low-sodium or reduced-sodium canned vegetables. Fruits All fresh, dried, or frozen fruit. Canned fruit in natural juice (without  added sugar). Meat and other protein foods Skinless chicken or turkey. Ground chicken or turkey. Pork with fat trimmed off. Fish and seafood. Egg whites. Dried beans, peas, or lentils. Unsalted nuts, nut butters, and seeds. Unsalted canned beans. Lean cuts of beef with fat trimmed off. Low-sodium, lean deli meat. Dairy Low-fat (1%) or fat-free (skim) milk. Fat-free, low-fat, or reduced-fat cheeses. Nonfat, low-sodium ricotta or cottage cheese. Low-fat or nonfat yogurt. Low-fat, low-sodium cheese. Fats and oils Soft margarine without trans fats. Vegetable oil. Low-fat, reduced-fat, or light mayonnaise and salad dressings (reduced-sodium). Canola, safflower, olive, soybean, and sunflower oils. Avocado. Seasoning and other foods Herbs. Spices. Seasoning mixes without salt. Unsalted popcorn and pretzels. Fat-free sweets. What foods are not recommended? The items listed may not be a complete list. Talk with your dietitian about what dietary choices are best for you. Grains Baked goods made with fat, such as croissants, muffins, or some breads. Dry pasta or rice meal packs. Vegetables Creamed or fried vegetables. Vegetables in a cheese sauce. Regular canned vegetables (not low-sodium or reduced-sodium). Regular canned tomato sauce and paste (not low-sodium or reduced-sodium). Regular tomato and vegetable juice (not low-sodium or reduced-sodium). Pickles. Olives. Fruits Canned fruit in a light or heavy syrup. Fried fruit. Fruit in cream or butter sauce. Meat and other protein foods Fatty cuts of meat. Ribs. Fried meat. Bacon. Sausage. Bologna and other processed lunch meats. Salami. Fatback. Hotdogs. Bratwurst. Salted nuts and seeds. Canned beans with added salt. Canned or smoked fish. Whole eggs or egg yolks. Chicken or turkey with skin. Dairy Whole or 2% milk, cream, and half-and-half. Whole or full-fat cream cheese. Whole-fat or sweetened yogurt. Full-fat cheese. Nondairy creamers. Whipped toppings.  Processed cheese and cheese spreads. Fats and oils Butter. Stick margarine. Lard. Shortening. Ghee. Bacon fat. Tropical oils, such as coconut, palm kernel, or palm oil. Seasoning and other foods Salted popcorn and pretzels. Onion salt, garlic salt, seasoned salt, table salt, and sea salt. Worcestershire sauce. Tartar sauce. Barbecue sauce. Teriyaki sauce. Soy sauce, including reduced-sodium. Steak sauce. Canned and packaged gravies. Fish sauce. Oyster sauce. Cocktail sauce. Horseradish that you find on the shelf. Ketchup. Mustard. Meat flavorings and tenderizers. Bouillon cubes. Hot sauce and Tabasco sauce. Premade or packaged marinades. Premade or packaged taco seasonings. Relishes. Regular salad dressings. Where to find more information:  National Heart, Lung, and Blood Institute: www.nhlbi.nih.gov  American Heart Association: www.heart.org Summary  The DASH eating plan is a healthy eating plan that has been shown to reduce high blood pressure (hypertension). It may also reduce your risk for type 2 diabetes, heart disease, and stroke.  With the DASH eating plan, you should limit salt (sodium) intake to 2,300 mg a day. If you have hypertension, you may need to reduce your sodium intake to 1,500 mg a day.  When on the DASH eating plan, aim to eat more fresh fruits and vegetables, whole grains, lean proteins, low-fat dairy, and heart-healthy fats.  Work with your health care provider or diet and nutrition specialist (dietitian) to adjust your eating plan to your individual   calorie needs. This information is not intended to replace advice given to you by your health care provider. Make sure you discuss any questions you have with your health care provider. Document Released: 02/01/2011 Document Revised: 02/06/2016 Document Reviewed: 02/06/2016 Elsevier Interactive Patient Education  2017 Elsevier Inc.  

## 2016-07-05 NOTE — Progress Notes (Signed)
Patient: Norma Johns Female    DOB: 06/08/1940   76 y.o.   MRN: [id number] Visit Date: 07/05/2016  Today's Provider: Mar Daring, PA-C   Chief Complaint  Patient presents with  . Follow-up    Anxiety, DMII,HTN,Hypercholesterolemia   Subjective:    HPI   Anxiety, Follow up:  The patient was last seen for Anxiety 1 months ago. Changes made since that visit include start Xanax and Lexapro. She reports she only takes the Lexapro prn and is doing the Xanax daily at bedtime. She has a brother that health is declining. She reports excellent compliance with treatment. She is not having side effects. She reports that the reason she takes the Lexapro prn is because when she takes it it makes her feel like "floating".  She IS experiencing fatigue, feelings of losing control, insomnia, irritable, palpitations, racing thoughts, shortness of breath, crying a lot and staying cold..   She denies current suicidal and homicidal ideation.   ------------------------------------------------------------------------   Diabetes Mellitus Type II, Follow-up:   Lab Results  Component Value Date   HGBA1C 6.2 04/05/2016   HGBA1C 6.5 12/12/2015   HGBA1C 6.0 06/15/2015    Last seen for diabetes 3 months ago.  Management since then includes none. She is currently not on any treatment. Current symptoms include visual disturbances and have been stable. Home blood sugar records: not cheking  Episodes of hypoglycemia? no   Current Insulin Regimen:  Most Recent Eye Exam: She reports last eye exam was in 2016-2017 she feels that her visual disturbances is because she needs new glasses and need cataract surgery. Weight trend: stable Prior visit with dietician: no Current diet: in general, a "healthy" diet   Current exercise: none  Pertinent Labs:    Component Value Date/Time   CHOL 224 (H) 12/13/2015 1039   CHOL 198 06/22/2013 1110   TRIG 118 12/13/2015 1039   TRIG 112  06/22/2013 1110   HDL 51 12/13/2015 1039   HDL 45 06/22/2013 1110   LDLCALC 149 (H) 12/13/2015 1039   LDLCALC 131 (H) 06/22/2013 1110   CREATININE 0.71 04/17/2016 1215   CREATININE 0.65 12/20/2013 0432    Wt Readings from Last 3 Encounters:  07/05/16 172 lb 6.4 oz (78.2 kg)  06/12/16 173 lb 9.6 oz (78.7 kg)  04/17/16 171 lb (77.6 kg)    ------------------------------------------------------------------------  Hypertension, follow-up:  BP Readings from Last 3 Encounters:  07/05/16 140/70  06/12/16 140/80  04/17/16 104/64    She was last seen for hypertension 3 months ago.  BP at that visit was 142/80. Management since that visit includes none She reports excellent compliance with treatment. She is not having side effects.  She is adherent to low salt diet.   Outside blood pressures are 140's/80's She is experiencing fatigue, lower extremity edema and palpitations.  Patient denies chest pain, chest pressure/discomfort, dyspnea, exertional chest pressure/discomfort, irregular heart beat and near-syncope.   Cardiovascular risk factors include advanced age (older than 71 for men, 23 for women), diabetes mellitus, dyslipidemia and hypertension.     Weight trend: stable Wt Readings from Last 3 Encounters:  07/05/16 172 lb 6.4 oz (78.2 kg)  06/12/16 173 lb 9.6 oz (78.7 kg)  04/17/16 171 lb (77.6 kg)    Current diet: in general, a "healthy" diet    ------------------------------------------------------------------------  Lipid/Cholesterol, Follow-up:   Last seen for this 6 months ago.  Management changes since that visit include none. . Last Lipid Panel:  Component Value Date/Time   CHOL 224 (H) 12/13/2015 1039   CHOL 198 06/22/2013 1110   TRIG 118 12/13/2015 1039   TRIG 112 06/22/2013 1110   HDL 51 12/13/2015 1039   HDL 45 06/22/2013 1110   CHOLHDL 4.4 12/13/2015 1039   VLDL 24 12/13/2015 1039   VLDL 22 06/22/2013 1110   LDLCALC 149 (H) 12/13/2015 1039    LDLCALC 131 (H) 06/22/2013 1110    Risk factors for vascular disease include diabetes mellitus, hypercholesterolemia and hypertension  She reports excellent compliance with treatment. She is not having side effects.  Current symptoms include none and have been stable. -------------------------------------------------------------------      Allergies  Allergen Reactions  . Atorvastatin   . Minocycline Hcl Itching  . Naproxen     Indigestion  . Niacin   . Penicillins Itching  . Septra [Sulfamethoxazole-Trimethoprim]   . Vioxx [Rofecoxib]      Current Outpatient Prescriptions:  .  albuterol (PROVENTIL HFA;VENTOLIN HFA) 108 (90 Base) MCG/ACT inhaler, Inhale 2 puffs into the lungs every 4 (four) hours as needed for wheezing or shortness of breath., Disp: 1 Inhaler, Rfl: 11 .  ALPRAZolam (XANAX) 0.5 MG tablet, Take 1 tablet (0.5 mg total) by mouth at bedtime as needed for anxiety., Disp: 30 tablet, Rfl: 5 .  aspirin EC 81 MG tablet, Take 1 tablet (81 mg total) by mouth daily., Disp: 1 tablet, Rfl: 0 .  escitalopram (LEXAPRO) 10 MG tablet, Take 1/2 tab PO q hs x 1 week, then increase to 1 tab PO q hs, Disp: 30 tablet, Rfl: 1 .  fexofenadine (ALLEGRA) 180 MG tablet, Take 180 mg by mouth daily., Disp: , Rfl:  .  fluticasone (FLONASE) 50 MCG/ACT nasal spray, Place 2 sprays into both nostrils daily. (Patient taking differently: Place 2 sprays into both nostrils daily. ), Disp: 16 g, Rfl: 5 .  Multiple Vitamin (MULTIVITAMIN) capsule, Take 1 capsule by mouth daily., Disp: , Rfl:  .  pantoprazole (PROTONIX) 40 MG tablet, Take 1 tablet (40 mg total) by mouth 2 (two) times daily before a meal., Disp: 180 tablet, Rfl: 1 .  polyethylene glycol powder (GLYCOLAX/MIRALAX) powder, Take 255 g (1 Container total) by mouth once. (Patient taking differently: Take 1 Container by mouth once. ), Disp: 255 g, Rfl: 0 .  vitamin B-12 (CYANOCOBALAMIN) 500 MCG tablet, Take 500 mcg by mouth daily., Disp: , Rfl:  .   predniSONE (STERAPRED UNI-PAK 21 TAB) 10 MG (21) TBPK tablet, 6 day taper; take as directed on package intructions (Patient not taking: Reported on 07/05/2016), Disp: 21 tablet, Rfl: 0  Review of Systems  Constitutional: Positive for fatigue.  Eyes: Positive for visual disturbance.  Respiratory: Negative for cough, chest tightness, shortness of breath and wheezing.   Cardiovascular: Positive for palpitations and leg swelling (ankles). Negative for chest pain.  Gastrointestinal: Positive for abdominal distention and constipation. Negative for abdominal pain, diarrhea, nausea and vomiting.  Endocrine: Positive for cold intolerance. Negative for heat intolerance, polydipsia, polyphagia and polyuria.  Genitourinary: Positive for urgency.  Neurological: Negative for dizziness, weakness, light-headedness, numbness and headaches.  Psychiatric/Behavioral: Positive for agitation (sometimes).    Social History  Substance Use Topics  . Smoking status: Never Smoker  . Smokeless tobacco: Never Used  . Alcohol use No   Objective:   BP 140/70 (BP Location: Right Arm, Patient Position: Sitting, Cuff Size: Normal)   Pulse 69   Temp 98.1 F (36.7 C) (Oral)   Resp 16   Wt 172  lb 6.4 oz (78.2 kg)   SpO2 98%   BMI 29.59 kg/m    Physical Exam  Constitutional: She appears well-developed and well-nourished. No distress.  Neck: Normal range of motion. Neck supple. No JVD present. No tracheal deviation present. No thyromegaly present.  Cardiovascular: Normal rate, regular rhythm and normal heart sounds.  Exam reveals no gallop and no friction rub.   No murmur heard. Pulmonary/Chest: Effort normal and breath sounds normal. No respiratory distress. She has no wheezes. She has no rales.  Lymphadenopathy:    She has no cervical adenopathy.  Skin: She is not diaphoretic.  Vitals reviewed.       Assessment & Plan:     1. Type 2 diabetes mellitus without complication, without long-term current use of  insulin (HCC) Stable. Continue diet and lifestyle modifications. Will check labs as below and f/u pending results. - CBC with Differential/Platelet - Comprehensive metabolic panel - Hemoglobin A1c - Lipid panel - TSH  2. Hypercholesteremia Stable. Patient intolerant to statins. Will check labs as below and f/u pending results. - CBC with Differential/Platelet - Comprehensive metabolic panel - Hemoglobin A1c - Lipid panel - TSH  3. Anxiety Continue alprazolam 0.5mg . Patient stopped lexapro. Will check labs as below and f/u pending results. - CBC with Differential/Platelet - Comprehensive metabolic panel - Hemoglobin A1c - Lipid panel - TSH  4. Benign hypertension Stable. Will check labs as below and f/u pending results. - CBC with Differential/Platelet - Comprehensive metabolic panel - Hemoglobin A1c - Lipid panel - TSH       Mar Daring, PA-C  [facility]

## 2016-07-06 ENCOUNTER — Telehealth: Payer: Self-pay

## 2016-07-06 LAB — HEMOGLOBIN A1C
HEMOGLOBIN A1C: 6.6 % — AB (ref 4.8–5.6)
Mean Plasma Glucose: 143 mg/dL

## 2016-07-06 NOTE — Telephone Encounter (Signed)
-----   Message from Mar Daring, [place] sent at 07/06/2016  1:59 PM EDT ----- Cholesterol has improved since last check but A1c has increased slightly from 6.2 to 6.6. Continue to work on healthy lifestyle modifications. Limit fatty foods, carbohydrates and sugars. Try to get in some more physical activity trying to get up to 150 minutes of activity per week. All other labs are stable and WNL.

## 2016-07-06 NOTE — Telephone Encounter (Signed)
Advised pt of lab results. Pt verbally acknowledges understanding. Adrieanna Boteler Drozdowski, CMA   

## 2016-07-12 ENCOUNTER — Ambulatory Visit: Payer: Medicare Other

## 2016-07-12 ENCOUNTER — Ambulatory Visit: Admission: RE | Admit: 2016-07-12 | Payer: Medicare Other | Source: Ambulatory Visit

## 2016-07-19 DIAGNOSIS — H40033 Anatomical narrow angle, bilateral: Secondary | ICD-10-CM | POA: Diagnosis not present

## 2016-07-26 ENCOUNTER — Encounter: Payer: Self-pay | Admitting: Physician Assistant

## 2016-08-08 ENCOUNTER — Ambulatory Visit
Admission: RE | Admit: 2016-08-08 | Discharge: 2016-08-08 | Disposition: A | Discharge status: Home / Self Care | Payer: Medicare Other | Source: Ambulatory Visit | Attending: Physician Assistant | Admitting: Physician Assistant

## 2016-08-08 ENCOUNTER — Other Ambulatory Visit: Payer: Self-pay | Admitting: Physician Assistant

## 2016-08-08 ENCOUNTER — Telehealth: Payer: Self-pay

## 2016-08-08 DIAGNOSIS — R928 Other abnormal and inconclusive findings on diagnostic imaging of breast: Secondary | ICD-10-CM

## 2016-08-08 DIAGNOSIS — N6320 Unspecified lump in the left breast, unspecified quadrant: Secondary | ICD-10-CM

## 2016-08-08 DIAGNOSIS — N6323 Unspecified lump in the left breast, lower outer quadrant: Secondary | ICD-10-CM | POA: Diagnosis not present

## 2016-08-08 NOTE — Telephone Encounter (Signed)
-----   Message from Mar Daring, [place] sent at 08/08/2016  2:14 PM EDT ----- Diagnsotic mammogram and [place] were indeterminate in the left breast. It is recommended to get biopsy. [facility] will send over orders for me to sign and they should be in contact with you for scheduling. Please contact us if you have not been contacted by Friday.

## 2016-08-08 NOTE — Telephone Encounter (Signed)
Patient advised as below. Patient verbalizes understanding and is in agreement with treatment plan.  

## 2016-08-13 ENCOUNTER — Telehealth: Payer: Self-pay | Admitting: Physician Assistant

## 2016-08-13 DIAGNOSIS — R52 Pain, unspecified: Secondary | ICD-10-CM

## 2016-08-13 MED ORDER — TRAMADOL HCL 50 MG PO TABS: 50.0000 mg | ORAL_TABLET | Freq: Three times a day (TID) | ORAL | 0 refills | Status: DC | PRN

## 2016-08-13 NOTE — Telephone Encounter (Signed)
Called in Rx as below. Patient was advised.

## 2016-08-13 NOTE — Telephone Encounter (Signed)
Voice mailbox not yet set up.  Will need to try again.  ED

## 2016-08-13 NOTE — Telephone Encounter (Signed)
Please call in tramadol 50mg  Take 1 tab PO q 6-8 hrs prn moderate to severe pain #30 NR  Thanks.

## 2016-08-13 NOTE — Telephone Encounter (Signed)
Patient wanted either Sonia Baller or the nurse to call her, because she a question about her procedure that she is getting from her mammogram

## 2016-08-13 NOTE — Telephone Encounter (Signed)
Patient reports that she would like something for pain when she has her biopsy done. She states that Tylenol will not be strong enough.

## 2016-08-13 NOTE — Telephone Encounter (Signed)
error 

## 2016-08-13 NOTE — Telephone Encounter (Signed)
It seems like they want to do a biopsy of the masses noted on the left breast.   Normally [facility] schedules this and calls. They will normally send the order to me to sign but I have not seen this yet. She can call [facility] directly. If she wishes to have someone else do the biopsy she can let me know. Dr. Bary Castilla and Dr. Tamala Julian are the two surgeons in town that do them also.

## 2016-08-14 ENCOUNTER — Ambulatory Visit: Payer: Medicare Other

## 2016-08-15 ENCOUNTER — Ambulatory Visit
Admission: RE | Admit: 2016-08-15 | Discharge: 2016-08-15 | Disposition: A | Discharge status: Home / Self Care | Payer: Medicare Other | Source: Ambulatory Visit | Attending: Physician Assistant | Admitting: Physician Assistant

## 2016-08-15 DIAGNOSIS — C50512 Malignant neoplasm of lower-outer quadrant of left female breast: Secondary | ICD-10-CM | POA: Diagnosis not present

## 2016-08-15 DIAGNOSIS — N6320 Unspecified lump in the left breast, unspecified quadrant: Secondary | ICD-10-CM

## 2016-08-15 DIAGNOSIS — R928 Other abnormal and inconclusive findings on diagnostic imaging of breast: Secondary | ICD-10-CM

## 2016-08-15 DIAGNOSIS — R590 Localized enlarged lymph nodes: Secondary | ICD-10-CM | POA: Diagnosis not present

## 2016-08-15 DIAGNOSIS — N6323 Unspecified lump in the left breast, lower outer quadrant: Secondary | ICD-10-CM | POA: Diagnosis not present

## 2016-08-15 DIAGNOSIS — Z7689 Persons encountering health services in other specified circumstances: Secondary | ICD-10-CM | POA: Diagnosis not present

## 2016-08-15 DIAGNOSIS — C773 Secondary and unspecified malignant neoplasm of axilla and upper limb lymph nodes: Secondary | ICD-10-CM | POA: Diagnosis not present

## 2016-08-15 DIAGNOSIS — C50912 Malignant neoplasm of unspecified site of left female breast: Secondary | ICD-10-CM | POA: Insufficient documentation

## 2016-08-20 ENCOUNTER — Ambulatory Visit (INDEPENDENT_AMBULATORY_CARE_PROVIDER_SITE_OTHER): Payer: Medicare Other | Admitting: Physician Assistant

## 2016-08-20 ENCOUNTER — Encounter: Payer: Self-pay | Admitting: Physician Assistant

## 2016-08-20 VITALS — BP 140/70 | HR 62 | Temp 98.2°F | Resp 16 | Wt 171.8 lb

## 2016-08-20 DIAGNOSIS — C50912 Malignant neoplasm of unspecified site of left female breast: Secondary | ICD-10-CM

## 2016-08-20 NOTE — Progress Notes (Signed)
Patient: Norma Johns Female    DOB: [birth year]   76 y.o.   MRN: [id number] Visit Date: 08/20/2016  Today's Provider: Mar Daring, PA-C   Chief Complaint  Patient presents with  . Follow-up   Subjective:    HPI Norma Johns is a 76 yr old female that comes to the office today following a recent diagnosis of invasive left lobular breast cancer with lymph node involvement. Overall she feels she is handling the situation the best she can. As of today's appointment she has not heard from the nurse health advisor about scheduling appointments with a surgeon or having the breast MRI ordered. She does mention she is unsure how aggressive with treatment she may be. I discussed with her that at this time we are in the unknown stage and that there was quite a bit more testing that will need to be done so we can give her the best advice on medical treatment options. She does report the masses have not been palpable to her. She is currently having no symptoms or complications.   She has been under increased stress recently, even before this diagnosis, as she has been the primary caregiver for her husband who had been in failing health (currently doing better) and for her brother. She had been recently placed on lexapro which she did not tolerate and xanax. She feels she is doing the best she can on the xanax alone and has not increased her usage. She is turning towards her faith for helping her cope at this time. She does have a good support system as well and her daughter did accompany her today to her OV.    Allergies  Allergen Reactions  . Sulfa Antibiotics     Other reaction(s): Other (See Comments) Other Reaction: Allergy  . Atorvastatin   . Minocycline Hcl Itching  . Naproxen     Indigestion  . Niacin   . Penicillins Itching  . Septra [Sulfamethoxazole-Trimethoprim]   . Vioxx [Rofecoxib]      Current Outpatient Prescriptions:  .  albuterol (PROVENTIL HFA;VENTOLIN  HFA) 108 (90 Base) MCG/ACT inhaler, Inhale 2 puffs into the lungs every 4 (four) hours as needed for wheezing or shortness of breath., Disp: 1 Inhaler, Rfl: 11 .  ALPRAZolam (XANAX) 0.5 MG tablet, Take 1 tablet (0.5 mg total) by mouth at bedtime as needed for anxiety., Disp: 30 tablet, Rfl: 5 .  aspirin EC 81 MG tablet, Take 1 tablet (81 mg total) by mouth daily., Disp: 1 tablet, Rfl: 0 .  fexofenadine (ALLEGRA) 180 MG tablet, Take 180 mg by mouth daily., Disp: , Rfl:  .  fluticasone (FLONASE) 50 MCG/ACT nasal spray, Place 2 sprays into both nostrils daily. (Patient taking differently: Place 2 sprays into both nostrils daily. ), Disp: 16 g, Rfl: 5 .  Multiple Vitamin (MULTIVITAMIN) capsule, Take 1 capsule by mouth daily., Disp: , Rfl:  .  pantoprazole (PROTONIX) 40 MG tablet, Take 1 tablet (40 mg total) by mouth 2 (two) times daily before a meal., Disp: 180 tablet, Rfl: 1 .  polyethylene glycol powder (GLYCOLAX/MIRALAX) powder, Take 255 g (1 Container total) by mouth once. (Patient taking differently: Take 1 Container by mouth once. ), Disp: 255 g, Rfl: 0 .  traMADol (ULTRAM) 50 MG tablet, Take 1 tablet (50 mg total) by mouth every 8 (eight) hours as needed., Disp: 30 tablet, Rfl: 0 .  vitamin B-12 (CYANOCOBALAMIN) 500 MCG tablet, Take 500 mcg by mouth daily.,  Disp: , Rfl:   Review of Systems  Constitutional: Negative.   Respiratory: Negative.   Cardiovascular: Negative.   Gastrointestinal: Negative.   Neurological: Negative.   Psychiatric/Behavioral: Positive for dysphoric mood. Negative for sleep disturbance. The patient is nervous/anxious.     Social History  Substance Use Topics  . Smoking status: Never Smoker  . Smokeless tobacco: Never Used  . Alcohol use No   Objective:   BP 140/70 (BP Location: Right Arm, Patient Position: Sitting, Cuff Size: Large)   Pulse 62   Temp 98.2 F (36.8 C) (Oral)   Resp 16   Wt 171 lb 12.8 oz (77.9 kg)   SpO2 99%   BMI 29.49 kg/m  Vitals:    08/20/16 1104  BP: 140/70  Pulse: 62  Resp: 16  Temp: 98.2 F (36.8 C)  TempSrc: Oral  SpO2: 99%  Weight: 171 lb 12.8 oz (77.9 kg)     Physical Exam  Constitutional: She appears well-developed and well-nourished. No distress.  HENT:  Head: Normocephalic and atraumatic.  Neck: Normal range of motion. Neck supple.  Skin: She is not diaphoretic.  Psychiatric: She has a normal mood and affect. Her behavior is normal. Judgment and thought content normal.  Vitals reviewed.       Assessment & Plan:     1. Lobular breast cancer, left [facility]) Discussed things that are still to come forth with testing to further decide what will be the best treatment option for her. She does report she may decide to do nothing but does want to know her options. She does want to see Dr. Bary Castilla as her surgeon as he has done a lumpectomy on the right breast in the past. She does need a breast MRI as well but states she will need to go to the "open" MRI that is with [facility] in [place]. I will go ahead and place the order for the breast MRI and will send Dr. Bary Castilla a message regarding this patient. She is not ready to schedule just yet until she talks to the oncology nurse health advisors, thus I will not place referral for that at this time. She is to call the office if she has not heard from them by Wednesday (08/22/16). She is to call the office if she needs anything or has any other questions or concerns.        Mar Daring, PA-C  [facility]

## 2016-08-21 ENCOUNTER — Encounter: Payer: Self-pay | Admitting: *Deleted

## 2016-08-21 ENCOUNTER — Telehealth: Payer: Self-pay | Admitting: Physician Assistant

## 2016-08-21 DIAGNOSIS — C50912 Malignant neoplasm of unspecified site of left female breast: Secondary | ICD-10-CM

## 2016-08-21 LAB — SURGICAL PATHOLOGY

## 2016-08-21 NOTE — Telephone Encounter (Signed)
Please review

## 2016-08-21 NOTE — Telephone Encounter (Signed)
Order changed.

## 2016-08-21 NOTE — Telephone Encounter (Signed)
Per [facility] MRI of breast needs to be changed to w/wo contrast,Thanks

## 2016-08-21 NOTE — Telephone Encounter (Signed)
Pt called wanting someone to call regarding the referral to [facility].  It is for a MRI of her breast  Pt's callback is [phone]  Thanks teri

## 2016-08-21 NOTE — Progress Notes (Signed)
  Oncology Nurse Navigator Documentation  Navigator Location: [facility] (08/21/16 1500)   )Navigator Encounter Type: Introductory phone call (08/21/16 1500)   Abnormal Finding Date: 08/08/16 (08/21/16 1500) Confirmed Diagnosis Date: 08/16/16 (08/21/16 1500)                   Barriers/Navigation Needs: Coordination of Care;Education (08/21/16 1500)   Interventions: Coordination of Care (08/21/16 1500)   Coordination of Care: Appts (08/21/16 1500)                  Time Spent with Patient: 45 (08/21/16 1500)   Called patient to establish navigation services.  States she "saw her doctor yesterday and is being scheduled for a breast MRI".  States she is a little confused about the MRI and where to go.  Informed her to call [facility] for location information.  Patient is scheduled to see Dr. Bary Castilla on 08/23/16 @ 11:15 for surgical consult.  Patient is a little stressed and states " I am taking care of my husband who has been with Hospice, and my brother who is with Hospice".  States she cannot go in the MRI unless it is a large one.  Informed her to discuss with Dr. Bary Castilla something for anxiety prior to the MRI.  States she is already taking Xanax.  Encouraged her to discuss the MRI with Dr.Byrnett.  Informed her I would take a bag of educational literature to his office for her to pick up on Thursday.  She is to call if she has any questions or needs.

## 2016-08-22 ENCOUNTER — Encounter: Payer: Self-pay | Admitting: *Deleted

## 2016-08-22 NOTE — Telephone Encounter (Signed)
Pt contacted and questions have been answered

## 2016-08-23 ENCOUNTER — Encounter: Payer: Self-pay | Admitting: General Surgery

## 2016-08-23 ENCOUNTER — Ambulatory Visit (INDEPENDENT_AMBULATORY_CARE_PROVIDER_SITE_OTHER): Payer: Medicare Other | Admitting: General Surgery

## 2016-08-23 ENCOUNTER — Other Ambulatory Visit
Admission: RE | Admit: 2016-08-23 | Discharge: 2016-08-23 | Disposition: A | Discharge status: Home / Self Care | Payer: Medicare Other | Source: Ambulatory Visit | Attending: General Surgery | Admitting: General Surgery

## 2016-08-23 VITALS — BP 134/70 | HR 72 | Resp 14 | Ht 66.0 in | Wt 178.0 lb

## 2016-08-23 DIAGNOSIS — C50512 Malignant neoplasm of lower-outer quadrant of left female breast: Secondary | ICD-10-CM | POA: Diagnosis not present

## 2016-08-23 DIAGNOSIS — C50912 Malignant neoplasm of unspecified site of left female breast: Secondary | ICD-10-CM | POA: Diagnosis not present

## 2016-08-23 DIAGNOSIS — Z170 Estrogen receptor positive status [ER+]: Secondary | ICD-10-CM

## 2016-08-23 NOTE — Progress Notes (Signed)
Patient ID: Norma Johns, female   DOB: 02-Jul-1940, 76 y.o.   MRN: [id number]  Chief Complaint  Patient presents with  . Other    HPI Norma Johns is a 76 y.o. female who presents for a breast evaluation. The most recent mammogram was done on 06/27/2016 and added views on 6/13/2018and biopsy on 08/15/2016.  Patient does perform regular self breast checks and does not gets regular mammograms done. Daughter, Norma Johns is at visit.  The patient was not aware of any problems with her breast prior to her current mammograms.   HPI  Past Medical History:  Diagnosis Date  . Asthma   . Depression   . Diabetes mellitus without complication ([place])   . GERD (gastroesophageal reflux disease)   . Hyperlipidemia   . Stroke [facility]) 2004    Past Surgical History:  Procedure Laterality Date  . ABDOMINAL HYSTERECTOMY    . BLADDER REPAIR    . BREAST BIOPSY Right 03/23/1991   Fibroadenoma with intramammary lymph node. Right breast, 9:00.  Marland Kitchen CHOLECYSTECTOMY    . COLONOSCOPY  2005  . MR MRA CAROTID  02/2010   Minimal plaque formation; no significant stenosis. Sx: Syncope  . NASAL SINUS SURGERY     [facility]    Family History  Problem Relation Age of Onset  . Cancer Father   . Prostate cancer Father   . Heart attack Mother   . Hypertension Mother   . CAD Mother   . Brain cancer Daughter   . Hypertension Son   . Diabetes Son   . Lung cancer Brother   . Leukemia Brother   . Prostate cancer Brother   . Breast cancer Neg Hx     Social History Social History  Substance Use Topics  . Smoking status: Never Smoker  . Smokeless tobacco: Never Used  . Alcohol use No    Allergies  Allergen Reactions  . Sulfa Antibiotics     Other reaction(s): Other (See Comments) Other Reaction: Allergy  . Atorvastatin   . Minocycline Hcl Itching  . Naproxen     Indigestion  . Niacin   . Penicillins Itching  . Septra [Sulfamethoxazole-Trimethoprim]   . Vioxx [Rofecoxib]     Current  Outpatient Prescriptions  Medication Sig Dispense Refill  . albuterol (PROVENTIL HFA;VENTOLIN HFA) 108 (90 Base) MCG/ACT inhaler Inhale 2 puffs into the lungs every 4 (four) hours as needed for wheezing or shortness of breath. 1 Inhaler 11  . ALPRAZolam (XANAX) 0.5 MG tablet Take 1 tablet (0.5 mg total) by mouth at bedtime as needed for anxiety. 30 tablet 5  . aspirin EC 81 MG tablet Take 1 tablet (81 mg total) by mouth daily. 1 tablet 0  . fexofenadine (ALLEGRA) 180 MG tablet Take 180 mg by mouth daily.    . fluticasone (FLONASE) 50 MCG/ACT nasal spray Place 2 sprays into both nostrils daily. (Patient taking differently: Place 2 sprays into both nostrils daily. ) 16 g 5  . Multiple Vitamin (MULTIVITAMIN) capsule Take 1 capsule by mouth daily.    . pantoprazole (PROTONIX) 40 MG tablet Take 1 tablet (40 mg total) by mouth 2 (two) times daily before a meal. 180 tablet 1  . polyethylene glycol powder (GLYCOLAX/MIRALAX) powder Take 255 g (1 Container total) by mouth once. (Patient taking differently: Take 1 Container by mouth once. ) 255 g 0  . traMADol (ULTRAM) 50 MG tablet Take 1 tablet (50 mg total) by mouth every 8 (eight) hours as needed. [place]  tablet 0  . vitamin B-12 (CYANOCOBALAMIN) 500 MCG tablet Take 500 mcg by mouth daily.     No current facility-administered medications for this visit.     Review of Systems Review of Systems  Constitutional: Negative.   Respiratory: Negative.   Cardiovascular: Negative.     Blood pressure 134/70, pulse 72, resp. rate 14, height 5\' 6"  (1.676 m), weight 178 lb (80.7 kg).  Physical Exam Physical Exam  Constitutional: She is oriented to person, place, and time. She appears well-developed and well-nourished.  Eyes: Conjunctivae are normal. No scleral icterus.  Neck: Neck supple.  Cardiovascular: Normal rate, regular rhythm and normal heart sounds.   Pulmonary/Chest: Effort normal and breath sounds normal. Right breast exhibits no inverted nipple, no  mass, no nipple discharge, no skin change and no tenderness. Left breast exhibits no inverted nipple, no mass, no nipple discharge, no skin change and no tenderness.    Mild bruising  at biopsy site on left breast.  Lymphadenopathy:    She has no cervical adenopathy.    She has no axillary adenopathy.  Neurological: She is oriented to person, place, and time.  Skin: Skin is warm and dry.    Data Reviewed January 2013 through June 2018 mammogram/ultrasound studies reviewed.  Multiple left breast/axillary biopsies of 08/15/2016:  DIAGNOSIS:  A. BREAST, LEFT, 4 O'CLOCK 3 CM FROM NIPPLE; ULTRASOUND-GUIDED CORE  BIOPSY:  - INVASIVE LOBULAR CARCINOMA, CLASSIC TYPE.   Size of invasive carcinoma: 6 mm in this sample  Histologic grade of invasive carcinoma: Grade 1    Glandular/tubular differentiation score: 3    Nuclear pleomorphism score: 1    Mitotic rate score: 1    Total score: 5  Ductal carcinomain situ: Present, low grade  Lymphovascular invasion: Not identified   B. BREAST, LEFT, 4 O'CLOCK 6 CM FROM NIPPLE; ULTRASOUND-GUIDED CORE  BIOPSY:  - INVASIVE LOBULAR CARCINOMA, CLASSIC TYPE.   Size of invasive carcinoma: 6 mm in this sample  Histologic grade of invasive carcinoma: Grade 1    Glandular/tubular differentiation score: 3    Nuclear pleomorphism score: 1    Mitotic rate score: 1    Total score: 5  Ductal carcinoma in situ: Not identified  Lymphovascular invasion: Not identified   C. LYMPH NODE, LEFT AXILLA; ULTRASOUND-GUIDED CORE BIOPSY:  - METASTATIC LOBULAR CARCINOMA; TUMOR SPANS 7 MM.  - NO EXTRANODAL EXTENSION IN THIS SAMPLE.   Assessment    Stage II invasive lobular carcinoma of the left breast.    Plan    Options for management reviewed. In light of the metastatic disease in lymph node a PET/CT is recommended.  The patient has been scheduled for bilateral breast MRI to be completed mid July in [place]. In light of the lobular  finding this is reasonable with the possibility for multifocal/bilateral disease.    The importance of a formal opinion from medical oncology after her imaging studies are available was emphasized.  In light of less than ideal response to adjuvant chemotherapy with tumors of lobular histology we'll arrange for a Mammoprint exam of the axillary node.  Approximate one hour was spent reviewing with the patient and her family options for management.  HPI, Physical Exam, Assessment and Plan have been scribed under the direction and in the presence of Hervey Ard, MD.  Gaspar Cola, CMA  I have completed the exam and reviewed the above documentation for accuracy and completeness.  I agree with the above.  Haematologist has been used and any errors  in dictation or transcription are unintentional.  Hervey Ard, M.D., F.A.C.S.  Robert Bellow 08/25/2016, 11:06 AM  Patient to have the following labs drawn at [facility] today: CA 27.29.  Dominga Ferry, CMA

## 2016-08-24 ENCOUNTER — Telehealth: Payer: Self-pay

## 2016-08-24 LAB — CANCER ANTIGEN 27.29: CA 27.29: 232.1 U/mL — AB (ref 0.0–38.6)

## 2016-08-24 NOTE — Telephone Encounter (Signed)
Call to patient to notify her of her PET scan. The patient is scheduled for this at [facility] on 08/30/16 at 11:30 am. She will arrive by 11:00 am and have nothing to eat or drink for 6 hours prior. The patient is aware of date, time, and instructions.

## 2016-08-25 ENCOUNTER — Encounter: Payer: Self-pay | Admitting: General Surgery

## 2016-08-25 DIAGNOSIS — Z170 Estrogen receptor positive status [ER+]: Principal | ICD-10-CM | POA: Insufficient documentation

## 2016-08-25 DIAGNOSIS — C50512 Malignant neoplasm of lower-outer quadrant of left female breast: Secondary | ICD-10-CM | POA: Insufficient documentation

## 2016-08-27 ENCOUNTER — Telehealth: Payer: Self-pay | Admitting: *Deleted

## 2016-08-27 NOTE — Telephone Encounter (Signed)
-----   Message from Robert Bellow, MD sent at 08/25/2016 11:13 AM EDT ----- Tomi Bamberger had placed an order for Mammoprint on this patient. Please contact [facility] and put this on hold. Rosann Auerbach says the contact number is in the notebook on her data, top right. Thanks.  ----- Message ----- From: Buel Ream, [facility] [place] Sent: 08/24/2016   4:38 AM To: Robert Bellow, MD

## 2016-08-27 NOTE — Telephone Encounter (Signed)
Called  [facility] and put mammoprint on hold. Talked to [place] at [facility]

## 2016-08-30 ENCOUNTER — Ambulatory Visit: Payer: Self-pay | Admitting: General Surgery

## 2016-08-30 ENCOUNTER — Other Ambulatory Visit: Payer: Self-pay | Admitting: General Surgery

## 2016-08-30 ENCOUNTER — Other Ambulatory Visit: Payer: Self-pay | Admitting: *Deleted

## 2016-08-30 ENCOUNTER — Ambulatory Visit
Admission: RE | Admit: 2016-08-30 | Discharge: 2016-08-30 | Disposition: A | Discharge status: Home / Self Care | Payer: Medicare Other | Source: Ambulatory Visit | Attending: General Surgery | Admitting: General Surgery

## 2016-08-30 DIAGNOSIS — R590 Localized enlarged lymph nodes: Secondary | ICD-10-CM | POA: Insufficient documentation

## 2016-08-30 DIAGNOSIS — C50912 Malignant neoplasm of unspecified site of left female breast: Secondary | ICD-10-CM | POA: Diagnosis not present

## 2016-08-30 DIAGNOSIS — Z9049 Acquired absence of other specified parts of digestive tract: Secondary | ICD-10-CM | POA: Diagnosis not present

## 2016-08-30 DIAGNOSIS — C50512 Malignant neoplasm of lower-outer quadrant of left female breast: Secondary | ICD-10-CM | POA: Diagnosis not present

## 2016-08-30 DIAGNOSIS — N200 Calculus of kidney: Secondary | ICD-10-CM | POA: Insufficient documentation

## 2016-08-30 DIAGNOSIS — Z90710 Acquired absence of both cervix and uterus: Secondary | ICD-10-CM | POA: Diagnosis not present

## 2016-08-30 DIAGNOSIS — K5730 Diverticulosis of large intestine without perforation or abscess without bleeding: Secondary | ICD-10-CM | POA: Diagnosis not present

## 2016-08-30 DIAGNOSIS — R160 Hepatomegaly, not elsewhere classified: Secondary | ICD-10-CM | POA: Insufficient documentation

## 2016-08-30 DIAGNOSIS — Z170 Estrogen receptor positive status [ER+]: Secondary | ICD-10-CM | POA: Diagnosis not present

## 2016-08-30 LAB — GLUCOSE, CAPILLARY: GLUCOSE-CAPILLARY: 117 mg/dL — AB (ref 65–99)

## 2016-08-30 MED ORDER — FLUDEOXYGLUCOSE F - 18 (FDG) INJECTION
12.0000 | Freq: Once | INTRAVENOUS | Status: AC | PRN
  Administered 2016-08-30: 11.83 via INTRAVENOUS

## 2016-09-04 ENCOUNTER — Ambulatory Visit: Payer: Self-pay | Admitting: General Surgery

## 2016-09-05 ENCOUNTER — Ambulatory Visit (INDEPENDENT_AMBULATORY_CARE_PROVIDER_SITE_OTHER): Payer: Medicare Other | Admitting: General Surgery

## 2016-09-05 ENCOUNTER — Encounter: Payer: Self-pay | Admitting: General Surgery

## 2016-09-05 VITALS — BP 130/74 | HR 72 | Resp 14 | Ht 66.0 in | Wt 170.0 lb

## 2016-09-05 DIAGNOSIS — Z170 Estrogen receptor positive status [ER+]: Secondary | ICD-10-CM

## 2016-09-05 DIAGNOSIS — C50512 Malignant neoplasm of lower-outer quadrant of left female breast: Secondary | ICD-10-CM | POA: Diagnosis not present

## 2016-09-05 NOTE — Progress Notes (Signed)
Patient ID: Norma Johns, female   DOB: Nov 26, 1940, 76 y.o.   MRN: [id number]  Chief Complaint  Patient presents with  . Other    HPI Norma Johns is a 76 y.o. female here today for a breast cancer discussion.   Marland KitchenHPI  Past Medical History:  Diagnosis Date  . Asthma   . Depression   . Diabetes mellitus without complication ([place])   . GERD (gastroesophageal reflux disease)   . Hyperlipidemia   . Stroke [facility]) 2004    Past Surgical History:  Procedure Laterality Date  . ABDOMINAL HYSTERECTOMY    . BLADDER REPAIR    . BREAST BIOPSY Right 03/23/1991   Fibroadenoma with intramammary lymph node. Right breast, 9:00.  Marland Kitchen CHOLECYSTECTOMY    . COLONOSCOPY  2005  . MR MRA CAROTID  02/2010   Minimal plaque formation; no significant stenosis. Sx: Syncope  . NASAL SINUS SURGERY     [facility]    Family History  Problem Relation Age of Onset  . Cancer Father   . Prostate cancer Father   . Heart attack Mother   . Hypertension Mother   . CAD Mother   . Brain cancer Daughter   . Hypertension Son   . Diabetes Son   . Lung cancer Brother   . Leukemia Brother   . Prostate cancer Brother   . Breast cancer Neg Hx     Social History Social History  Substance Use Topics  . Smoking status: Never Smoker  . Smokeless tobacco: Never Used  . Alcohol use No    Allergies  Allergen Reactions  . Sulfa Antibiotics     Other reaction(s): Other (See Comments) Other Reaction: Allergy  . Atorvastatin   . Minocycline Hcl Itching  . Naproxen     Indigestion  . Niacin   . Penicillins Itching  . Septra [Sulfamethoxazole-Trimethoprim]   . Vioxx [Rofecoxib]     Current Outpatient Prescriptions  Medication Sig Dispense Refill  . albuterol (PROVENTIL HFA;VENTOLIN HFA) 108 (90 Base) MCG/ACT inhaler Inhale 2 puffs into the lungs every 4 (four) hours as needed for wheezing or shortness of breath. 1 Inhaler 11  . ALPRAZolam (XANAX) 0.5 MG tablet Take 1 tablet (0.5 mg total) by mouth at  bedtime as needed for anxiety. 30 tablet 5  . aspirin EC 81 MG tablet Take 1 tablet (81 mg total) by mouth daily. 1 tablet 0  . fexofenadine (ALLEGRA) 180 MG tablet Take 180 mg by mouth daily.    . fluticasone (FLONASE) 50 MCG/ACT nasal spray Place 2 sprays into both nostrils daily. (Patient taking differently: Place 2 sprays into both nostrils daily. ) 16 g 5  . Multiple Vitamin (MULTIVITAMIN) capsule Take 1 capsule by mouth daily.    . pantoprazole (PROTONIX) 40 MG tablet Take 1 tablet (40 mg total) by mouth 2 (two) times daily before a meal. 180 tablet 1  . polyethylene glycol powder (GLYCOLAX/MIRALAX) powder Take 255 g (1 Container total) by mouth once. (Patient taking differently: Take 1 Container by mouth once. ) 255 g 0  . traMADol (ULTRAM) 50 MG tablet Take 1 tablet (50 mg total) by mouth every 8 (eight) hours as needed. 30 tablet 0  . vitamin B-12 (CYANOCOBALAMIN) 500 MCG tablet Take 500 mcg by mouth daily.     No current facility-administered medications for this visit.     Review of Systems Review of Systems  Constitutional: Negative.   Respiratory: Negative.   Cardiovascular: Negative.  Blood pressure 130/74, pulse 72, resp. rate 14, height 5\' 6"  (1.676 m), weight 170 lb (77.1 kg).  Physical Exam Physical Exam Deferred.   Data Reviewed PET scan results of August 30, 2016 reviewed. No distant metastatic disease. LFT's normal except TB at 1.3, previously 0.4-1.0. Normal Alkaline phosphatase and transaminases.  Recommendations from [facility] breast tumor conference of 09/03/2016 reviewed: Formal axillary dissection with breast management based on upcoming breast MRI. Axillary dissection recommended as there is presently no plan for adjuvant chemotherapy due to the lobular nature of her tumor and the possibility that more extensive disease would warrant post axillary dissection radiation.   Assessment    Stage IB carcinoma of the left breast, invasive lobular.    Plan     The patient will be contacted after her upcoming MRI. Her homework assignments is to consider whether she would like breast conservation with ideally post operative radiation therapy or mastectomy with or without reconstruction. Plans for axillary dissection were reviewed.         Robert Bellow 09/05/2016, 8:46 AM

## 2016-09-07 ENCOUNTER — Other Ambulatory Visit: Payer: Self-pay | Admitting: Physician Assistant

## 2016-09-07 DIAGNOSIS — K219 Gastro-esophageal reflux disease without esophagitis: Secondary | ICD-10-CM

## 2016-09-07 MED ORDER — PANTOPRAZOLE SODIUM 40 MG PO TBEC: 40.0000 mg | DELAYED_RELEASE_TABLET | Freq: Two times a day (BID) | ORAL | 1 refills | Status: DC

## 2016-09-07 NOTE — Telephone Encounter (Signed)
Please review. Thanks!  

## 2016-09-07 NOTE — Telephone Encounter (Signed)
Pt contacted office for refill request on the following medications:    pantoprazole (PROTONIX) 40 MG tablet.  [facility] [place].  CB#[phone]/MW  Pt would like to pick this up today/MW

## 2016-09-10 ENCOUNTER — Ambulatory Visit
Admission: RE | Admit: 2016-09-10 | Discharge: 2016-09-10 | Disposition: A | Discharge status: Home / Self Care | Payer: Medicare Other | Source: Ambulatory Visit | Attending: Physician Assistant | Admitting: Physician Assistant

## 2016-09-10 DIAGNOSIS — C50912 Malignant neoplasm of unspecified site of left female breast: Secondary | ICD-10-CM | POA: Diagnosis not present

## 2016-09-10 MED ORDER — GADOBENATE DIMEGLUMINE 529 MG/ML IV SOLN
16.0000 mL | Freq: Once | INTRAVENOUS | Status: AC | PRN
  Administered 2016-09-10: 16 mL via INTRAVENOUS

## 2016-09-16 ENCOUNTER — Other Ambulatory Visit: Payer: Self-pay | Admitting: General Surgery

## 2016-09-17 ENCOUNTER — Encounter: Payer: Self-pay | Admitting: General Surgery

## 2016-09-17 ENCOUNTER — Ambulatory Visit (INDEPENDENT_AMBULATORY_CARE_PROVIDER_SITE_OTHER): Payer: Medicare Other | Admitting: General Surgery

## 2016-09-17 VITALS — BP 138/70 | HR 82 | Resp 14 | Ht 66.0 in | Wt 170.0 lb

## 2016-09-17 DIAGNOSIS — C50512 Malignant neoplasm of lower-outer quadrant of left female breast: Secondary | ICD-10-CM | POA: Diagnosis not present

## 2016-09-17 DIAGNOSIS — C50912 Malignant neoplasm of unspecified site of left female breast: Secondary | ICD-10-CM

## 2016-09-17 NOTE — Progress Notes (Signed)
Patient ID: Norma Johns, female   DOB: 01/10/1941, 76 y.o.   MRN: [id number]  Chief Complaint  Patient presents with  . Follow-up    HPI Norma Johns is a 76 y.o. female here today to discuss MRI results.  HPI  Past Medical History:  Diagnosis Date  . Asthma   . Depression   . Diabetes mellitus without complication ([place])   . GERD (gastroesophageal reflux disease)   . Hyperlipidemia   . Stroke [facility]) 2004    Past Surgical History:  Procedure Laterality Date  . ABDOMINAL HYSTERECTOMY    . BLADDER REPAIR    . BREAST BIOPSY Right 03/23/1991   Fibroadenoma with intramammary lymph node. Right breast, 9:00.  [facility] CHOLECYSTECTOMY    . COLONOSCOPY  2005  . MR MRA CAROTID  02/2010   Minimal plaque formation; no significant stenosis. Sx: Syncope  . NASAL SINUS SURGERY     [facility]    Family History  Problem Relation Age of Onset  . Cancer Father   . Prostate cancer Father   . Heart attack Mother   . Hypertension Mother   . CAD Mother   . Brain cancer Daughter   . Hypertension Son   . Diabetes Son   . Lung cancer Brother   . Leukemia Brother   . Prostate cancer Brother   . Breast cancer Neg Hx     Social History Social History  Substance Use Topics  . Smoking status: Never Smoker  . Smokeless tobacco: Never Used  . Alcohol use No    Allergies  Allergen Reactions  . Sulfa Antibiotics     Other reaction(s): Other (See Comments) Other Reaction: Allergy  . Atorvastatin   . Minocycline Hcl Itching  . Naproxen     Indigestion  . Niacin   . Penicillins Itching  . Septra [Sulfamethoxazole-Trimethoprim]   . Vioxx [Rofecoxib]     Current Outpatient Prescriptions  Medication Sig Dispense Refill  . albuterol (PROVENTIL HFA;VENTOLIN HFA) 108 (90 Base) MCG/ACT inhaler Inhale 2 puffs into the lungs every 4 (four) hours as needed for wheezing or shortness of breath. 1 Inhaler 11  . ALPRAZolam (XANAX) 0.5 MG tablet Take 1 tablet (0.5 mg total) by mouth at bedtime  as needed for anxiety. 30 tablet 5  . aspirin EC 81 MG tablet Take 1 tablet (81 mg total) by mouth daily. 1 tablet 0  . fexofenadine (ALLEGRA) 180 MG tablet Take 180 mg by mouth daily.    . fluticasone (FLONASE) 50 MCG/ACT nasal spray Place 2 sprays into both nostrils daily. (Patient taking differently: Place 2 sprays into both nostrils daily. ) 16 g 5  . Multiple Vitamin (MULTIVITAMIN) capsule Take 1 capsule by mouth daily.    . pantoprazole (PROTONIX) 40 MG tablet Take 1 tablet (40 mg total) by mouth 2 (two) times daily before a meal. 180 tablet 1  . polyethylene glycol powder (GLYCOLAX/MIRALAX) powder Take 255 g (1 Container total) by mouth once. (Patient taking differently: Take 1 Container by mouth once. ) 255 g 0  . traMADol (ULTRAM) 50 MG tablet Take 1 tablet (50 mg total) by mouth every 8 (eight) hours as needed. 30 tablet 0  . vitamin B-12 (CYANOCOBALAMIN) 500 MCG tablet Take 500 mcg by mouth daily.     No current facility-administered medications for this visit.     Review of Systems Review of Systems  Constitutional: Negative.   Respiratory: Negative.   Cardiovascular: Negative.     Blood pressure  138/70, pulse 82, resp. rate 14, height 5\' 6"  (1.676 m), weight 170 lb (77.1 kg).  Physical Exam Physical Exam  Data Reviewed MRI of 09/10/2016 reviewed.  Assessment    Multifocal invasive lobular carcinoma left breast.  Additional foci of MRI abnormality in the adjacent quadrant of the left breast.  2 foci of abnormality in opposite quadrants of the right breast.    Plan    We spent about 45 minutes reviewing the MRI results. The issue gets down to what the patient is willing to go through.  In regards the left breast with known invasive lobular carcinoma, likely 2 separate foci rather than 1 large foci, with the adjacent area of MRI abnormality in the upper outer quadrant this may make breast conservation difficult. If the area of MRI abnormality in the left upper outer  quadrant is benign The patient might be able to undergo breast conservation on the left side with planned axillary dissection as reviewed at the [facility] tumor board of 09/03/2016. Radiation therapy with an follow-up.  The patient had re: express an eversion to radiation, and in light of the MRI findings in the left breast she may be best managed by modified radical mastectomy and then confirm any recommendation for radiation to the final tumor size in the breast (over 5 cm might warrant postmastectomy radiation but certainly would not be mandated) and potential for limited axillary radiation based on the number of nodes positive (5 or more.) Sees.  Regards the right breast options for management include 1) sees biopsy of the 2 areas of concern under MRI guidance versus 2) right prophylactic mastectomy. While the latter is generally frowned upon based on the [facility] position statement, in light of the finding of invasive lobular carcinoma there is a higher than average chance of identifying a pathologic process that could be managed by simple mastectomy (sentinel node would be planned at the same setting).  The MRI will be reviewed by second radiologist in the next 24 hours and the patient will be contacted with that interpretation, with special attention to the right breast were many the recommendations were based by the known pathology of the left breast.  At this time, the patient does not feel she wants to undergo another left breast biopsy and be more comfortable with modified radical mastectomy, without reconstruction at this time.  She'll defer decision on the right breast to the reassessment of her recent MRI.       Robert Bellow 09/18/2016, 8:19 AM

## 2016-09-18 ENCOUNTER — Telehealth: Payer: Self-pay | Admitting: General Surgery

## 2016-09-18 DIAGNOSIS — C50912 Malignant neoplasm of unspecified site of left female breast: Secondary | ICD-10-CM | POA: Insufficient documentation

## 2016-09-18 NOTE — Telephone Encounter (Signed)
After yesterday's visit I had made arrangements for a second radiology review of her recently completed MRI. The 2 lesions in the right breast are radiologically identical to the area biopsy proven invasive lobular carcinoma.  On reflection, the patient has elected, at least as of today, to defer right breast biopsy and to proceed with a mastectomy on that side.  Based on last night conversation, and the MRI findings in a separate quadrant of the right breast she is amenable to proceeding to a left modified radical mastectomy. Formal lymph node excision based on [facility] tumor board conference of 09/03/2016.  I spoke with her daughter by phone to review this information.  The patient will make a final decision and contact the office of when she would like to schedule surgery.

## 2016-09-19 ENCOUNTER — Other Ambulatory Visit: Payer: Self-pay | Admitting: *Deleted

## 2016-09-19 ENCOUNTER — Telehealth: Payer: Self-pay | Admitting: *Deleted

## 2016-09-19 DIAGNOSIS — C50912 Malignant neoplasm of unspecified site of left female breast: Secondary | ICD-10-CM

## 2016-09-19 NOTE — Telephone Encounter (Signed)
Patient called to let us know she wants to proceed with surgery-she stated bilateral mastectomy, which was discussed in previous office visit. Thanks

## 2016-09-20 ENCOUNTER — Other Ambulatory Visit: Payer: Self-pay | Admitting: General Surgery

## 2016-09-20 NOTE — Telephone Encounter (Signed)
Patient's surgery has been scheduled for 10-01-16 at [facility]. This patient is aware of date, time, and instructions.   This patient was instructed to call the office if she has further questions.

## 2016-09-21 ENCOUNTER — Other Ambulatory Visit: Payer: Self-pay | Admitting: General Surgery

## 2016-09-24 ENCOUNTER — Encounter
Admission: RE | Admit: 2016-09-24 | Discharge: 2016-09-24 | Disposition: A | Discharge status: Home / Self Care | Payer: Medicare Other | Source: Ambulatory Visit | Attending: General Surgery | Admitting: General Surgery

## 2016-09-24 HISTORY — DX: Anemia, unspecified: D64.9

## 2016-09-24 HISTORY — DX: Headache, unspecified: R51.9

## 2016-09-24 HISTORY — DX: Sleep apnea, unspecified: G47.30

## 2016-09-24 HISTORY — DX: Unspecified osteoarthritis, unspecified site: M19.90

## 2016-09-24 HISTORY — DX: Headache: R51

## 2016-09-24 NOTE — Patient Instructions (Signed)
  Your procedure is scheduled on: 10-01-16 MONDAY Report to [place] (2ND DESK ON RIGHT) @ 7:45 AM  Remember: Instructions that are not followed completely may result in serious medical risk, up to and including death, or upon the discretion of your surgeon and anesthesiologist your surgery may need to be rescheduled.    _x___ 1. Do not eat food or drink liquids after midnight. No gum chewing or hard candies.     __x__ 2. No Alcohol for 24 hours before or after surgery.   __x__3. No Smoking for 24 prior to surgery.   ____  4. Bring all medications with you on the day of surgery if instructed.    __x__ 5. Notify your doctor if there is any change in your medical condition     (cold, fever, infections).     Do not wear jewelry, make-up, hairpins, clips or nail polish.  Do not wear lotions, powders, or perfumes. You may wear deodorant.  Do not shave 48 hours prior to surgery. Men may shave face and neck.  Do not bring valuables to the hospital.    [facility] is not responsible for any belongings or valuables.               Contacts, dentures or bridgework may not be worn into surgery.  Leave your suitcase in the car. After surgery it may be brought to your room.  For patients admitted to the hospital, discharge time is determined by your treatment team.   Patients discharged the day of surgery will not be allowed to drive home.  You will need someone to drive you home and stay with you the night of your procedure.    Please read over the following fact sheets that you were given:     _x___ [place] WITH A SMALL SIP OF WATER. These include:  1. PROTONIX  2. MAY TAKE XANAX IF NEEDED DAY OF SURGERY  3.  4.  5.  6.  ____Fleets enema or Magnesium Citrate as directed.   _x___ Use CHG Soap or sage wipes as directed on instruction sheet   _X___ Use inhalers on the day of surgery and bring to hospital day of surgery-USE  ALBUTEROL INHALER AT [place]  ____ Stop Metformin and Janumet 2 days prior to surgery.    ____ Take 1/2 of usual insulin dose the night before surgery and none on the morning surgery.   ____ Follow recommendations from Cardiologist, Pulmonologist or PCP regarding stopping Aspirin, Coumadin, Pllavix ,Eliquis, Effient, or Pradaxa, and Pletal-OK TO CONTINUE 81 MG ASPIRIN-DO NOT TAKE AM OF SURGERY  ____Stop Anti-inflammatories such as Advil, Aleve, Ibuprofen, Motrin, Naproxen, Naprosyn, Goodies powders or aspirin products. OK to take Tylenol OR TRAMADOL   ____ Stop supplements until after surgery.     ____ Bring C-Pap to the hospital.

## 2016-09-25 ENCOUNTER — Encounter: Payer: Self-pay | Admitting: Physician Assistant

## 2016-09-25 ENCOUNTER — Ambulatory Visit
Admission: RE | Admit: 2016-09-25 | Discharge: 2016-09-25 | Disposition: A | Discharge status: Home / Self Care | Payer: Medicare Other | Source: Ambulatory Visit | Attending: General Surgery | Admitting: General Surgery

## 2016-09-25 ENCOUNTER — Ambulatory Visit (INDEPENDENT_AMBULATORY_CARE_PROVIDER_SITE_OTHER): Payer: Medicare Other | Admitting: Physician Assistant

## 2016-09-25 VITALS — BP 100/80 | HR 60 | Temp 97.8°F | Resp 16 | Wt 169.0 lb

## 2016-09-25 DIAGNOSIS — J0140 Acute pansinusitis, unspecified: Secondary | ICD-10-CM

## 2016-09-25 DIAGNOSIS — Z01812 Encounter for preprocedural laboratory examination: Secondary | ICD-10-CM | POA: Diagnosis not present

## 2016-09-25 DIAGNOSIS — C50912 Malignant neoplasm of unspecified site of left female breast: Secondary | ICD-10-CM | POA: Diagnosis not present

## 2016-09-25 DIAGNOSIS — Z01810 Encounter for preprocedural cardiovascular examination: Secondary | ICD-10-CM | POA: Diagnosis not present

## 2016-09-25 DIAGNOSIS — I10 Essential (primary) hypertension: Secondary | ICD-10-CM | POA: Insufficient documentation

## 2016-09-25 DIAGNOSIS — R928 Other abnormal and inconclusive findings on diagnostic imaging of breast: Secondary | ICD-10-CM | POA: Insufficient documentation

## 2016-09-25 MED ORDER — PREDNISONE 10 MG (21) PO TBPK: ORAL_TABLET | ORAL | 0 refills | Status: DC

## 2016-09-25 MED ORDER — LEVOFLOXACIN 500 MG PO TABS: 500.0000 mg | ORAL_TABLET | Freq: Every day | ORAL | 0 refills | Status: DC

## 2016-09-25 NOTE — Progress Notes (Signed)
Patient: Norma Johns Female    DOB: 03-27-1940   76 y.o.   MRN: [id number] Visit Date: 09/25/2016  Today's Provider: Mar Daring, PA-C   Chief Complaint  Patient presents with  . Sinusitis   Subjective:    HPI Upper Respiratory Infection: Patient complains of symptoms of a URI, possible sinusitis. Symptoms include bilateral ear pain, congestion and cough. Onset of symptoms was a few days ago, gradually worsening since that time. She also c/o facial pain for the past 1 day .  She is drinking plenty of fluids. Evaluation to date: none. Treatment to date: none.  Patient is scheduled for left radical mastectomy, possibly bilateral on Monday of next week.     Allergies  Allergen Reactions  . Chocolate Shortness Of Breath  . Other Shortness Of Breath and Swelling    PT STATES ALLERGY TO "SOME TYPE OF DYE THAT WAS USED AT [facility]."  SWELLING TO ARM AND SOB.  PT CANNOT REMEMBER WHAT THE DYE WAS USED FOR.  . Sulfa Antibiotics Other (See Comments)    unknown  . Atorvastatin Other (See Comments)    Leg pain   . Minocycline Hcl Itching  . Naproxen Other (See Comments)    Indigestion  . Niacin Other (See Comments)    Felt like she was on fire  . Septra [Sulfamethoxazole-Trimethoprim] Other (See Comments)    unknown  . Vioxx [Rofecoxib] Other (See Comments)    unknown  . Penicillins Itching and Rash    Has patient had a PCN reaction causing immediate rash, facial/tongue/throat swelling, SOB or lightheadedness with hypotension: Unknown Has patient had a PCN reaction causing severe rash involving mucus membranes or skin necrosis: Unknown Has patient had a PCN reaction that required hospitalization: No Has patient had a PCN reaction occurring within the last 10 years: No If all of the above answers are "NO", then may proceed with Cephalosporin use.      Current Outpatient Prescriptions:  .  acetaminophen (TYLENOL) 500 MG tablet, Take 1,000 mg by mouth  every 6 (six) hours as needed for mild pain or headache., Disp: , Rfl:  .  albuterol (PROVENTIL HFA;VENTOLIN HFA) 108 (90 Base) MCG/ACT inhaler, Inhale 2 puffs into the lungs every 4 (four) hours as needed for wheezing or shortness of breath., Disp: 1 Inhaler, Rfl: 11 .  ALPRAZolam (XANAX) 0.5 MG tablet, Take 1 tablet (0.5 mg total) by mouth at bedtime as needed for anxiety. (Patient taking differently: Take 0.5 mg by mouth as needed for anxiety. ), Disp: 30 tablet, Rfl: 5 .  aspirin EC 81 MG tablet, Take 1 tablet (81 mg total) by mouth daily., Disp: 1 tablet, Rfl: 0 .  fexofenadine (ALLEGRA) 180 MG tablet, Take 180 mg by mouth 3 (three) times a week. PRN, Disp: , Rfl:  .  fluticasone (FLONASE) 50 MCG/ACT nasal spray, Place 2 sprays into both nostrils daily. (Patient taking differently: Place 2 sprays into both nostrils 2 (two) times daily as needed for allergies. ), Disp: 16 g, Rfl: 5 .  Multiple Vitamin (MULTIVITAMIN) capsule, Take 1 capsule by mouth daily., Disp: , Rfl:  .  pantoprazole (PROTONIX) 40 MG tablet, Take 1 tablet (40 mg total) by mouth 2 (two) times daily before a meal., Disp: 180 tablet, Rfl: 1 .  polyethylene glycol (MIRALAX / GLYCOLAX) packet, Take 17 g by mouth daily as needed for mild constipation., Disp: , Rfl:  .  polyethylene glycol powder (GLYCOLAX/MIRALAX) powder, Take 255  g (1 Container total) by mouth once. (Patient not taking: Reported on 09/20/2016), Disp: 255 g, Rfl: 0 .  vitamin B-12 (CYANOCOBALAMIN) 1000 MCG tablet, Take 1,000 mcg by mouth daily., Disp: , Rfl:   Review of Systems  Constitutional: Negative.   HENT: Positive for congestion, ear pain (right), postnasal drip, rhinorrhea, sinus pain and sinus pressure. Negative for ear discharge, sore throat, tinnitus and trouble swallowing.   Respiratory: Positive for cough.   Cardiovascular: Positive for leg swelling.  Neurological: Positive for headaches. Negative for dizziness.    Social History  Substance Use  Topics  . Smoking status: Never Smoker  . Smokeless tobacco: Never Used  . Alcohol use No   Objective:   BP 100/80 (BP Location: Left Arm, Patient Position: Sitting, Cuff Size: Large)   Pulse 60   Temp 97.8 F (36.6 C) (Oral)   Resp 16   Wt 169 lb (76.7 kg)   SpO2 98%   BMI 27.28 kg/m  Vitals:   09/25/16 1131  BP: 100/80  Pulse: 60  Resp: 16  Temp: 97.8 F (36.6 C)  TempSrc: Oral  SpO2: 98%  Weight: 169 lb (76.7 kg)     Physical Exam  Constitutional: She appears well-developed and well-nourished. No distress.  HENT:  Head: Normocephalic and atraumatic.  Right Ear: Hearing, external ear and ear canal normal. Tympanic membrane is erythematous. Tympanic membrane is not perforated, not retracted and not bulging. A middle ear effusion is present.  Left Ear: Hearing, tympanic membrane, external ear and ear canal normal.  Nose: Right sinus exhibits maxillary sinus tenderness and frontal sinus tenderness. Left sinus exhibits maxillary sinus tenderness and frontal sinus tenderness.  Mouth/Throat: Uvula is midline, oropharynx is clear and moist and mucous membranes are normal. No oropharyngeal exudate.  Neck: Normal range of motion. Neck supple. No tracheal deviation present. No thyromegaly present.  Cardiovascular: Normal rate, regular rhythm and normal heart sounds.  Exam reveals no gallop and no friction rub.   No murmur heard. Pulmonary/Chest: Effort normal and breath sounds normal. No stridor. No respiratory distress. She has no wheezes. She has no rales.  Lymphadenopathy:    She has no cervical adenopathy.  Skin: She is not diaphoretic.  Vitals reviewed.       Assessment & Plan:     1. Acute pansinusitis, recurrence not specified Will treat with levaquin as below and prednisone taper for inflammation to try to get her feeling better so she will be able to proceed with her planned surgery on Monday. She is to call if symptoms fail to improve.  - levofloxacin (LEVAQUIN)  500 MG tablet; Take 1 tablet (500 mg total) by mouth daily.  Dispense: 7 tablet; Refill: 0 - predniSONE (STERAPRED UNI-PAK 21 TAB) 10 MG (21) TBPK tablet; Take as directed on package instructions; 6 day taper  Dispense: 21 tablet; Refill: 0       Mar Daring, PA-C  [facility]

## 2016-09-25 NOTE — Patient Instructions (Signed)

## 2016-10-01 ENCOUNTER — Encounter
Admission: RE | Admit: 2016-10-01 | Discharge: 2016-10-01 | Disposition: A | Discharge status: Home / Self Care | Payer: Medicare Other | Source: Ambulatory Visit | Attending: General Surgery | Admitting: General Surgery

## 2016-10-01 ENCOUNTER — Encounter
Admission: RE | Disposition: A | Discharge status: Home / Self Care | Payer: Self-pay | Source: Ambulatory Visit | Attending: General Surgery

## 2016-10-01 ENCOUNTER — Encounter: Payer: Self-pay | Admitting: *Deleted

## 2016-10-01 ENCOUNTER — Observation Stay
Admission: RE | Admit: 2016-10-01 | Discharge: 2016-10-02 | Disposition: A | Discharge status: Home / Self Care | Payer: Medicare Other | Source: Ambulatory Visit | Attending: General Surgery | Admitting: General Surgery

## 2016-10-01 ENCOUNTER — Ambulatory Visit: Payer: Medicare Other | Admitting: Registered Nurse

## 2016-10-01 DIAGNOSIS — C50912 Malignant neoplasm of unspecified site of left female breast: Secondary | ICD-10-CM

## 2016-10-01 DIAGNOSIS — G4730 Sleep apnea, unspecified: Secondary | ICD-10-CM | POA: Insufficient documentation

## 2016-10-01 DIAGNOSIS — C773 Secondary and unspecified malignant neoplasm of axilla and upper limb lymph nodes: Secondary | ICD-10-CM | POA: Insufficient documentation

## 2016-10-01 DIAGNOSIS — Z888 Allergy status to other drugs, medicaments and biological substances status: Secondary | ICD-10-CM | POA: Diagnosis not present

## 2016-10-01 DIAGNOSIS — Z8673 Personal history of transient ischemic attack (TIA), and cerebral infarction without residual deficits: Secondary | ICD-10-CM | POA: Diagnosis not present

## 2016-10-01 DIAGNOSIS — N6011 Diffuse cystic mastopathy of right breast: Secondary | ICD-10-CM | POA: Insufficient documentation

## 2016-10-01 DIAGNOSIS — Z882 Allergy status to sulfonamides status: Secondary | ICD-10-CM | POA: Diagnosis not present

## 2016-10-01 DIAGNOSIS — J45909 Unspecified asthma, uncomplicated: Secondary | ICD-10-CM | POA: Diagnosis not present

## 2016-10-01 DIAGNOSIS — K219 Gastro-esophageal reflux disease without esophagitis: Secondary | ICD-10-CM | POA: Insufficient documentation

## 2016-10-01 DIAGNOSIS — C50911 Malignant neoplasm of unspecified site of right female breast: Secondary | ICD-10-CM | POA: Diagnosis not present

## 2016-10-01 DIAGNOSIS — E119 Type 2 diabetes mellitus without complications: Secondary | ICD-10-CM | POA: Diagnosis not present

## 2016-10-01 DIAGNOSIS — R001 Bradycardia, unspecified: Secondary | ICD-10-CM | POA: Insufficient documentation

## 2016-10-01 DIAGNOSIS — F329 Major depressive disorder, single episode, unspecified: Secondary | ICD-10-CM | POA: Insufficient documentation

## 2016-10-01 DIAGNOSIS — M1990 Unspecified osteoarthritis, unspecified site: Secondary | ICD-10-CM | POA: Insufficient documentation

## 2016-10-01 DIAGNOSIS — Z79899 Other long term (current) drug therapy: Secondary | ICD-10-CM | POA: Diagnosis not present

## 2016-10-01 DIAGNOSIS — Z7982 Long term (current) use of aspirin: Secondary | ICD-10-CM | POA: Insufficient documentation

## 2016-10-01 DIAGNOSIS — D4861 Neoplasm of uncertain behavior of right breast: Secondary | ICD-10-CM | POA: Diagnosis not present

## 2016-10-01 DIAGNOSIS — F419 Anxiety disorder, unspecified: Secondary | ICD-10-CM | POA: Diagnosis not present

## 2016-10-01 DIAGNOSIS — Z880 Allergy status to penicillin: Secondary | ICD-10-CM | POA: Diagnosis not present

## 2016-10-01 DIAGNOSIS — Z91018 Allergy to other foods: Secondary | ICD-10-CM | POA: Diagnosis not present

## 2016-10-01 DIAGNOSIS — R928 Other abnormal and inconclusive findings on diagnostic imaging of breast: Secondary | ICD-10-CM | POA: Diagnosis not present

## 2016-10-01 DIAGNOSIS — Z886 Allergy status to analgesic agent status: Secondary | ICD-10-CM | POA: Diagnosis not present

## 2016-10-01 DIAGNOSIS — C50919 Malignant neoplasm of unspecified site of unspecified female breast: Secondary | ICD-10-CM | POA: Diagnosis present

## 2016-10-01 DIAGNOSIS — I10 Essential (primary) hypertension: Secondary | ICD-10-CM | POA: Insufficient documentation

## 2016-10-01 DIAGNOSIS — C50812 Malignant neoplasm of overlapping sites of left female breast: Secondary | ICD-10-CM | POA: Diagnosis not present

## 2016-10-01 DIAGNOSIS — C50412 Malignant neoplasm of upper-outer quadrant of left female breast: Secondary | ICD-10-CM | POA: Diagnosis not present

## 2016-10-01 HISTORY — PX: SIMPLE MASTECTOMY WITH AXILLARY SENTINEL NODE BIOPSY: SHX6098

## 2016-10-01 HISTORY — PX: MASTECTOMY MODIFIED RADICAL: SHX5962

## 2016-10-01 HISTORY — PX: SENTINEL NODE BIOPSY: SHX6608

## 2016-10-01 LAB — GLUCOSE, CAPILLARY: GLUCOSE-CAPILLARY: 111 mg/dL — AB (ref 65–99)

## 2016-10-01 SURGERY — MASTECTOMY, MODIFIED RADICAL
Anesthesia: General | Laterality: Right | Wound class: Clean

## 2016-10-01 MED ORDER — ONDANSETRON HCL 4 MG/2ML IJ SOLN
INTRAMUSCULAR | Status: AC
  Filled 2016-10-01: qty 2

## 2016-10-01 MED ORDER — OXYCODONE HCL 5 MG PO TABS: 5.0000 mg | ORAL_TABLET | Freq: Once | ORAL | Status: DC | PRN

## 2016-10-01 MED ORDER — METHYLENE BLUE 0.5 % INJ SOLN
INTRAVENOUS | Status: DC | PRN
  Administered 2016-10-01: 5 mL via SUBMUCOSAL

## 2016-10-01 MED ORDER — FENTANYL CITRATE (PF) 100 MCG/2ML IJ SOLN
25.0000 ug | INTRAMUSCULAR | Status: DC | PRN
  Administered 2016-10-01: 50 ug via INTRAVENOUS
  Administered 2016-10-01 (×2): 25 ug via INTRAVENOUS

## 2016-10-01 MED ORDER — PROPOFOL 10 MG/ML IV BOLUS
INTRAVENOUS | Status: DC | PRN
  Administered 2016-10-01: 30 mg via INTRAVENOUS
  Administered 2016-10-01: 150 mg via INTRAVENOUS
  Administered 2016-10-01: 20 mg via INTRAVENOUS

## 2016-10-01 MED ORDER — PANTOPRAZOLE SODIUM 40 MG PO TBEC
40.0000 mg | DELAYED_RELEASE_TABLET | Freq: Two times a day (BID) | ORAL | Status: DC
  Administered 2016-10-02: 40 mg via ORAL
  Filled 2016-10-01: qty 1

## 2016-10-01 MED ORDER — MIDAZOLAM HCL 2 MG/2ML IJ SOLN
INTRAMUSCULAR | Status: DC | PRN
  Administered 2016-10-01: 2 mg via INTRAVENOUS

## 2016-10-01 MED ORDER — FENTANYL CITRATE (PF) 250 MCG/5ML IJ SOLN
INTRAMUSCULAR | Status: AC
  Filled 2016-10-01: qty 5

## 2016-10-01 MED ORDER — ADULT MULTIVITAMIN W/MINERALS CH
1.0000 | ORAL_TABLET | Freq: Every day | ORAL | Status: DC
  Administered 2016-10-01 – 2016-10-02 (×2): 1 via ORAL
  Filled 2016-10-01 (×2): qty 1

## 2016-10-01 MED ORDER — FENTANYL CITRATE (PF) 100 MCG/2ML IJ SOLN
INTRAMUSCULAR | Status: AC
  Administered 2016-10-01: 17:00:00
  Filled 2016-10-01: qty 2

## 2016-10-01 MED ORDER — MIDAZOLAM HCL 2 MG/2ML IJ SOLN
INTRAMUSCULAR | Status: AC
  Filled 2016-10-01: qty 2

## 2016-10-01 MED ORDER — OXYCODONE HCL 5 MG/5ML PO SOLN: 5.0000 mg | Freq: Once | ORAL | Status: DC | PRN

## 2016-10-01 MED ORDER — PROMETHAZINE HCL 25 MG/ML IJ SOLN: 6.2500 mg | INTRAMUSCULAR | Status: DC | PRN

## 2016-10-01 MED ORDER — ACETAMINOPHEN 10 MG/ML IV SOLN
INTRAVENOUS | Status: AC
  Filled 2016-10-01: qty 100

## 2016-10-01 MED ORDER — KETOROLAC TROMETHAMINE 30 MG/ML IJ SOLN
INTRAMUSCULAR | Status: DC | PRN
  Administered 2016-10-01: 30 mg via INTRAVENOUS

## 2016-10-01 MED ORDER — OXYCODONE HCL 5 MG PO TABS
5.0000 mg | ORAL_TABLET | ORAL | Status: DC | PRN
  Administered 2016-10-01: 10 mg via ORAL
  Administered 2016-10-01: 5 mg via ORAL
  Filled 2016-10-01: qty 1
  Filled 2016-10-01: qty 2

## 2016-10-01 MED ORDER — LACTATED RINGERS IV SOLN: INTRAVENOUS | Status: DC

## 2016-10-01 MED ORDER — ROCURONIUM BROMIDE 50 MG/5ML IV SOLN
INTRAVENOUS | Status: AC
  Filled 2016-10-01: qty 1

## 2016-10-01 MED ORDER — VITAMIN B-12 1000 MCG PO TABS
1000.0000 ug | ORAL_TABLET | Freq: Every day | ORAL | Status: DC
  Administered 2016-10-01 – 2016-10-02 (×2): 1000 ug via ORAL
  Filled 2016-10-01 (×2): qty 1

## 2016-10-01 MED ORDER — PROMETHAZINE HCL 25 MG/ML IJ SOLN
INTRAMUSCULAR | Status: AC
  Administered 2016-10-01: 6.25 mg via INTRAVENOUS
  Filled 2016-10-01: qty 1

## 2016-10-01 MED ORDER — KETOROLAC TROMETHAMINE 30 MG/ML IJ SOLN
30.0000 mg | Freq: Three times a day (TID) | INTRAMUSCULAR | Status: DC
  Administered 2016-10-01 – 2016-10-02 (×3): 30 mg via INTRAVENOUS
  Filled 2016-10-01 (×3): qty 1

## 2016-10-01 MED ORDER — ACETAMINOPHEN 10 MG/ML IV SOLN
INTRAVENOUS | Status: DC | PRN
  Administered 2016-10-01: 1000 mg via INTRAVENOUS

## 2016-10-01 MED ORDER — GLYCOPYRROLATE 0.2 MG/ML IJ SOLN
INTRAMUSCULAR | Status: AC
  Filled 2016-10-01: qty 1

## 2016-10-01 MED ORDER — MEPERIDINE HCL 50 MG/ML IJ SOLN: 6.2500 mg | INTRAMUSCULAR | Status: DC | PRN

## 2016-10-01 MED ORDER — DEXAMETHASONE SODIUM PHOSPHATE 10 MG/ML IJ SOLN
INTRAMUSCULAR | Status: AC
  Filled 2016-10-01: qty 1

## 2016-10-01 MED ORDER — HYDROCODONE-ACETAMINOPHEN 5-325 MG PO TABS: 1.0000 | ORAL_TABLET | ORAL | 0 refills | Status: DC | PRN

## 2016-10-01 MED ORDER — ASPIRIN EC 81 MG PO TBEC
81.0000 mg | DELAYED_RELEASE_TABLET | Freq: Every day | ORAL | Status: DC
  Administered 2016-10-01 – 2016-10-02 (×2): 81 mg via ORAL
  Filled 2016-10-01 (×2): qty 1

## 2016-10-01 MED ORDER — LIDOCAINE HCL (CARDIAC) 20 MG/ML IV SOLN
INTRAVENOUS | Status: DC | PRN
  Administered 2016-10-01: 100 mg via INTRAVENOUS

## 2016-10-01 MED ORDER — FENTANYL CITRATE (PF) 100 MCG/2ML IJ SOLN
INTRAMUSCULAR | Status: DC | PRN
  Administered 2016-10-01: 50 ug via INTRAVENOUS
  Administered 2016-10-01: 100 ug via INTRAVENOUS
  Administered 2016-10-01 (×2): 50 ug via INTRAVENOUS

## 2016-10-01 MED ORDER — EPHEDRINE SULFATE 50 MG/ML IJ SOLN
INTRAMUSCULAR | Status: DC | PRN
  Administered 2016-10-01: 10 mg via INTRAVENOUS

## 2016-10-01 MED ORDER — HYDROMORPHONE HCL 1 MG/ML IJ SOLN
0.2500 mg | INTRAMUSCULAR | Status: DC | PRN
  Administered 2016-10-01: 0.25 mg via INTRAVENOUS
  Administered 2016-10-01: 0.5 mg via INTRAVENOUS
  Administered 2016-10-01: 0.25 mg via INTRAVENOUS

## 2016-10-01 MED ORDER — POLYETHYLENE GLYCOL 3350 17 G PO PACK
17.0000 g | PACK | Freq: Every day | ORAL | Status: DC | PRN
Start: 2016-10-01 — End: 2016-10-02

## 2016-10-01 MED ORDER — HYDROMORPHONE HCL 1 MG/ML IJ SOLN
INTRAMUSCULAR | Status: AC
  Administered 2016-10-01: 0.25 mg via INTRAVENOUS
  Filled 2016-10-01: qty 1

## 2016-10-01 MED ORDER — ALPRAZOLAM 0.5 MG PO TABS
0.5000 mg | ORAL_TABLET | Freq: Three times a day (TID) | ORAL | Status: DC | PRN
Start: 2016-10-01 — End: 2016-10-02

## 2016-10-01 MED ORDER — TECHNETIUM TC 99M SULFUR COLLOID FILTERED
1.0000 | Freq: Once | INTRAVENOUS | Status: AC | PRN
  Administered 2016-10-01: 0.77 via INTRADERMAL

## 2016-10-01 MED ORDER — ALBUTEROL SULFATE (2.5 MG/3ML) 0.083% IN NEBU: 2.5000 mg | INHALATION_SOLUTION | RESPIRATORY_TRACT | Status: DC | PRN

## 2016-10-01 MED ORDER — ONDANSETRON HCL 4 MG/2ML IJ SOLN
INTRAMUSCULAR | Status: DC | PRN
  Administered 2016-10-01: 4 mg via INTRAVENOUS

## 2016-10-01 MED ORDER — ACETAMINOPHEN 10 MG/ML IV SOLN
1000.0000 mg | Freq: Four times a day (QID) | INTRAVENOUS | Status: DC
  Administered 2016-10-01 – 2016-10-02 (×3): 1000 mg via INTRAVENOUS
  Filled 2016-10-01 (×4): qty 100

## 2016-10-01 MED ORDER — DEXAMETHASONE SODIUM PHOSPHATE 10 MG/ML IJ SOLN
INTRAMUSCULAR | Status: DC | PRN
  Administered 2016-10-01: 10 mg via INTRAVENOUS

## 2016-10-01 MED ORDER — FENTANYL CITRATE (PF) 100 MCG/2ML IJ SOLN
25.0000 ug | INTRAMUSCULAR | Status: DC | PRN
  Administered 2016-10-01: 50 ug via INTRAVENOUS
  Administered 2016-10-01: 25 ug via INTRAVENOUS
  Administered 2016-10-01: 50 ug via INTRAVENOUS
  Administered 2016-10-01: 25 ug via INTRAVENOUS

## 2016-10-01 MED ORDER — SODIUM CHLORIDE 0.9 % IV SOLN
INTRAVENOUS | Status: DC
  Administered 2016-10-01 (×2): via INTRAVENOUS

## 2016-10-01 MED ORDER — PROMETHAZINE HCL 25 MG/ML IJ SOLN
6.2500 mg | INTRAMUSCULAR | Status: DC | PRN
  Administered 2016-10-01: 6.25 mg via INTRAVENOUS

## 2016-10-01 MED ORDER — SUGAMMADEX SODIUM 200 MG/2ML IV SOLN
INTRAVENOUS | Status: DC | PRN
  Administered 2016-10-01: 150 mg via INTRAVENOUS

## 2016-10-01 MED ORDER — METHYLENE BLUE 0.5 % INJ SOLN
INTRAVENOUS | Status: AC
  Filled 2016-10-01: qty 10

## 2016-10-01 MED ORDER — ROCURONIUM BROMIDE 100 MG/10ML IV SOLN
INTRAVENOUS | Status: DC | PRN
  Administered 2016-10-01: 10 mg via INTRAVENOUS
  Administered 2016-10-01: 30 mg via INTRAVENOUS

## 2016-10-01 MED ORDER — LIDOCAINE HCL (PF) 2 % IJ SOLN
INTRAMUSCULAR | Status: AC
  Filled 2016-10-01: qty 2

## 2016-10-01 MED ORDER — PROPOFOL 10 MG/ML IV BOLUS
INTRAVENOUS | Status: AC
  Filled 2016-10-01: qty 20

## 2016-10-01 MED ORDER — FLUTICASONE PROPIONATE 50 MCG/ACT NA SUSP
2.0000 | Freq: Two times a day (BID) | NASAL | Status: DC | PRN
  Filled 2016-10-01: qty 16

## 2016-10-01 MED ORDER — HYDROMORPHONE HCL 1 MG/ML IJ SOLN: 1.0000 mg | INTRAMUSCULAR | Status: DC | PRN

## 2016-10-01 SURGICAL SUPPLY — 55 items
APPLIER CLIP 11 MED OPEN (CLIP)
APPLIER CLIP 13 LRG OPEN (CLIP)
BANDAGE ELASTIC 6 LF NS (GAUZE/BANDAGES/DRESSINGS) ×8 IMPLANT
BLADE PHOTON ILLUMINATED (MISCELLANEOUS) ×4 IMPLANT
BLADE SURG 15 STRL SS SAFETY (BLADE) ×8 IMPLANT
BNDG GAUZE 4.5X4.1 6PLY STRL (MISCELLANEOUS) ×8 IMPLANT
BULB RESERV EVAC DRAIN JP 100C (MISCELLANEOUS) ×8 IMPLANT
CANISTER SUCT 1200ML W/VALVE (MISCELLANEOUS) ×4 IMPLANT
CHLORAPREP W/TINT 26ML (MISCELLANEOUS) ×8 IMPLANT
CLIP APPLIE 11 MED OPEN (CLIP) IMPLANT
CLIP APPLIE 13 LRG OPEN (CLIP) IMPLANT
CLOSURE WOUND 1/2 X4 (GAUZE/BANDAGES/DRESSINGS) ×2
CNTNR SPEC 2.5X3XGRAD LEK (MISCELLANEOUS)
CONT SPEC 4OZ STER OR WHT (MISCELLANEOUS)
CONTAINER SPEC 2.5X3XGRAD LEK (MISCELLANEOUS) IMPLANT
COVER PROBE FLX POLY STRL (MISCELLANEOUS) ×4 IMPLANT
DEVICE DUBIN SPECIMEN MAMMOGRA (MISCELLANEOUS) IMPLANT
DRAIN CHANNEL JP 15F RND 16 (MISCELLANEOUS) ×8 IMPLANT
DRAPE LAPAROTOMY TRNSV 106X77 (MISCELLANEOUS) IMPLANT
DRSG TELFA 3X8 NADH (GAUZE/BANDAGES/DRESSINGS) ×12 IMPLANT
ELECT CAUTERY BLADE TIP 2.5 (TIP)
ELECT REM PT RETURN 9FT ADLT (ELECTROSURGICAL) ×4
ELECTRODE CAUTERY BLDE TIP 2.5 (TIP) IMPLANT
ELECTRODE REM PT RTRN 9FT ADLT (ELECTROSURGICAL) ×2 IMPLANT
GAUZE FLUFF 18X24 1PLY STRL (GAUZE/BANDAGES/DRESSINGS) ×8 IMPLANT
GAUZE SPONGE 4X4 12PLY STRL (GAUZE/BANDAGES/DRESSINGS) IMPLANT
GLOVE BIO SURGEON STRL SZ7.5 (GLOVE) ×8 IMPLANT
GLOVE INDICATOR 8.0 STRL GRN (GLOVE) ×8 IMPLANT
GOWN STRL REUS W/ TWL LRG LVL3 (GOWN DISPOSABLE) ×4 IMPLANT
GOWN STRL REUS W/TWL LRG LVL3 (GOWN DISPOSABLE) ×4
KIT RM TURNOVER STRD PROC AR (KITS) ×4 IMPLANT
LABEL OR SOLS (LABEL) ×4 IMPLANT
NDL SAFETY 18GX1.5 (NEEDLE) IMPLANT
NDL SAFETY 22GX1.5 (NEEDLE) ×4 IMPLANT
PACK BASIN MINOR ARMC (MISCELLANEOUS) ×4 IMPLANT
PAD TELFA 2X3 NADH STRL (GAUZE/BANDAGES/DRESSINGS) ×4 IMPLANT
PIN SAFETY STRL (MISCELLANEOUS) IMPLANT
SHEARS FOC LG CVD HARMONIC 17C (MISCELLANEOUS) ×4 IMPLANT
SLEVE PROBE SENORX GAMMA FIND (MISCELLANEOUS) IMPLANT
SPONGE LAP 18X18 5 PK (GAUZE/BANDAGES/DRESSINGS) ×4 IMPLANT
STRIP CLOSURE SKIN 1/2X4 (GAUZE/BANDAGES/DRESSINGS) ×6 IMPLANT
SUT ETHILON 3-0 FS-10 30 BLK (SUTURE) ×8
SUT SILK 0 (SUTURE) ×2
SUT SILK 0 30XBRD TIE 6 (SUTURE) ×2 IMPLANT
SUT VIC AB 2-0 CT1 (SUTURE) ×8 IMPLANT
SUT VIC AB 2-0 CT1 27 (SUTURE) ×4
SUT VIC AB 2-0 CT1 TAPERPNT 27 (SUTURE) ×4 IMPLANT
SUT VIC AB 3-0 SH 27 (SUTURE) ×4
SUT VIC AB 3-0 SH 27X BRD (SUTURE) ×4 IMPLANT
SUT VICRYL+ 3-0 144IN (SUTURE) ×4 IMPLANT
SUTURE EHLN 3-0 FS-10 30 BLK (SUTURE) ×4 IMPLANT
SWABSTK COMLB BENZOIN TINCTURE (MISCELLANEOUS) ×8 IMPLANT
SYR CONTROL 10ML (SYRINGE) ×4 IMPLANT
SYRINGE 10CC LL (SYRINGE) ×4 IMPLANT
TAPE TRANSPORE STRL 2 31045 (GAUZE/BANDAGES/DRESSINGS) ×4 IMPLANT

## 2016-10-01 NOTE — Progress Notes (Signed)
Admin 6.25 mg IV phenergan for nausea.  Pt resting in bed with eyes closed snoring.

## 2016-10-01 NOTE — Anesthesia Postprocedure Evaluation (Signed)
Anesthesia Post Note  Patient: Norma Johns  Procedure(s) Performed: Procedure(s) (LRB): MASTECTOMY MODIFIED RADICAL (Left) SENTINEL NODE BIOPSY (Right) SIMPLE MASTECTOMY (Right)  Patient location during evaluation: PACU Anesthesia Type: General Level of consciousness: awake and alert and oriented Pain management: pain level controlled Vital Signs Assessment: post-procedure vital signs reviewed and stable Respiratory status: spontaneous breathing, nonlabored ventilation and respiratory function stable Cardiovascular status: blood pressure returned to baseline and stable Postop Assessment: no signs of nausea or vomiting Anesthetic complications: no     Last Vitals:  Vitals:   10/01/16 1336 10/01/16 1342  BP:  130/80  Pulse: 66 63  Resp: 13 11  Temp:      Last Pain:  Vitals:   10/01/16 1342  TempSrc:   PainSc: Asleep                 Wanette Robison

## 2016-10-01 NOTE — Transfer of Care (Signed)
Anesthesia Post Note  Patient: Norma Johns  Procedure(s) Performed: Procedure(s) (LRB): MASTECTOMY MODIFIED RADICAL (Left) SENTINEL NODE BIOPSY (Right) SIMPLE MASTECTOMY (Right)  Anesthesia type: General  Patient location: ICU  Post pain: Pain level controlled  Post assessment: Post-op Vital signs reviewed  Last Vitals:  Vitals:   10/01/16 0907 10/01/16 1243  BP: (!) 194/91 125/84  Pulse:  70  Resp:  14  Temp:  (!) 36.2 C    Post vital signs: stable  Level of consciousness: Patient remains intubated per anesthesia plan  Complications: No apparent anesthesia complications

## 2016-10-01 NOTE — Progress Notes (Signed)
Dentures returned to patient and she placed in her mouth.

## 2016-10-01 NOTE — Op Note (Signed)
Preoperative diagnosis: 1) invasive lobular carcinoma the left breast with axillary metastases. 2) abnormal right breast MRI.  Postoperative diagnosis: Same.  Operative procedure: 1) left modified radical mastectomy. 2) right simple mastectomy with sentinel node biopsy.  Operating surgeon: Ollen Bowl, M.D.  Anesthesia: Gen. endotracheal.  Estimated blood loss: Less than 50 mL.  Clinical note: This 76 year old woman has been identified with multifocal invasive lobular carcinoma involving the left breast with evidence of axillary metastasis. MRI suggested another foci within the left breast and 2 abnormal areas in the right breast with a similar MRI appearance to the area of invasive lobular carcinoma in the left breast. After per case presentation at the [facility] breast tumor conference she was felt a candidate for left modified radical mastectomy with axillary dissection because of the positive nodes and the patient's desire to avoid postoperative radiation therapy if possible and poor response of lobular carcinoma to adjuvant chemotherapy. The patient elected to proceed to contralateral mastectomy with the MRI findings and her desire not to go through multiple biopsy process.  The patient was injected with technetium sulfur colloid prior to the procedure.  Operative note: With the patient under adequate general endotracheal anesthesia the periareolar tissue on the right was cleansed with alcohol and 5 mL of 0.5% methylene blue was instilled in the subareolar plexus. The breasts, chest and axilla were prepped with ChloraPrep bilaterally. Drapes were applied. Preoperatively the area at the midline of the nipple areolar complex was marked bilaterally to provide for symmetrical incisions. Through elliptical incision the flaps on the left side were raised. Extensive dissection was the sternum medially, clavicle superiorly, serratus fascia laterally and the rectus fascia inferiorly. The breast was  elevated off the underlying pectoralis muscle taking the fascia that muscle with the specimen. The axillary and the lobe was entered and the axillary vein identified. Making use of the Harmonic scalpel the axillary contents were swept inferiorly and laterally. The long thoracic nerve of Bell as well as the thoracodorsal nerve artery and vein bundle were identified and protected. Both nerves were functional at the end of the procedure. Final hemostasis was achieved. The wound was irrigated with sterile water. A single Blake drain was brought out through the inferior medial flap and anchored into position with a 3-0 nylon suture. The skin flaps were approximated with a running 3-0 Vicryl deep dermal suture. The surgical site was covered with a towel and attention turned to the right breast.  The right breast was approached in a similar fashion with elliptical incision. Flaps were as described above. The axillary and the lobe was entered and scanning with the node seeker device showed the area of increased uptake was actually in the axillary tail of the breast. A single hot, blue node was identified in the left axilla. This was isolated from the breast parenchyma and sent separately as sentinel node #1. Both breasts were sent individually as right simple mastectomy, left modified radical mastectomy fresh to pathology per protocol. The wound was irrigated and a Blake drain was placed as described above for the left side. The breast flaps were approximated with a running 2-0 Vicryl deep dermal suture. Benzoin and Steri-Strips were applied to both wounds. Telfa pad, fluff gauze, Kerlix wrap followed by an Ace wrap were applied.  The patient tolerated the procedure well and was taken to the recovery room in stable condition.

## 2016-10-01 NOTE — Progress Notes (Signed)
Patient states she feels nauseated now, hooked back up To monitor, on and off moaning in pain.

## 2016-10-01 NOTE — Progress Notes (Signed)
Spoke with Dr. Randa Lynn may do another 150 mcg of fentanyl and do some po meds as ordered.

## 2016-10-01 NOTE — Progress Notes (Signed)
Patient stated that she knows how to use IS

## 2016-10-01 NOTE — Anesthesia Preprocedure Evaluation (Signed)
Anesthesia Evaluation  Patient identified by MRN, date of birth, ID band Patient awake    Reviewed: Allergy & Precautions, NPO status , Patient's Chart, lab work & pertinent test results  History of Anesthesia Complications Negative for: history of anesthetic complications  Airway Mallampati: III  TM Distance: >3 FB Neck ROM: Full    Dental  (+) Upper Dentures, Lower Dentures   Pulmonary asthma , sleep apnea ,    breath sounds clear to auscultation- rhonchi (-) wheezing      Cardiovascular Exercise Tolerance: Good hypertension, Pt. on medications (-) CAD, (-) Past MI and (-) Cardiac Stents  Rhythm:Regular Rate:Normal - Systolic murmurs and - Diastolic murmurs    Neuro/Psych  Headaches, PSYCHIATRIC DISORDERS Anxiety Depression CVA, No Residual Symptoms    GI/Hepatic Neg liver ROS, GERD  ,  Endo/Other  diabetes (diet controlled)  Renal/GU negative Renal ROS     Musculoskeletal  (+) Arthritis ,   Abdominal (+) - obese,   Peds  Hematology  (+) anemia ,   Anesthesia Other Findings Past Medical History: No date: Anemia No date: Arthritis No date: Asthma     Comment:  WELL CONTROLLED No date: Depression No date: Diabetes mellitus without complication (HCC) No date: GERD (gastroesophageal reflux disease) No date: Headache     Comment:  H/O No date: Hyperlipidemia No date: Hypertension     Comment:  H/O No date: Sleep apnea     Comment:  DOES NOT USE CPAP-COULD NOT TOLERATE 2004: Stroke ([place])   Reproductive/Obstetrics                             Anesthesia Physical Anesthesia Plan  ASA: III  Anesthesia Plan: General   Post-op Pain Management:    Induction: Intravenous  PONV Risk Score and Plan: 2 and Ondansetron and Dexamethasone  Airway Management Planned: Oral ETT  Additional Equipment:   Intra-op Plan:   Post-operative Plan: Extubation in OR  Informed Consent: I have  reviewed the patients History and Physical, chart, labs and discussed the procedure including the risks, benefits and alternatives for the proposed anesthesia with the patient or authorized representative who has indicated his/her understanding and acceptance.   Dental advisory given  Plan Discussed with: CRNA and Anesthesiologist  Anesthesia Plan Comments:         Anesthesia Quick Evaluation

## 2016-10-01 NOTE — Progress Notes (Signed)
2 jp drains present on arrival to pacu.

## 2016-10-01 NOTE — Progress Notes (Signed)
Called OR to notify Dr. Bary Castilla of pain status, we may Still send her over to post op and he will see her There.  Plan is still to send her home.  Patient now Snoring in pacu.  Will continue to monitor.

## 2016-10-01 NOTE — Anesthesia Procedure Notes (Signed)
Procedure Name: Intubation Date/Time: 10/01/2016 9:16 AM Performed by: Doreen Salvage Pre-anesthesia Checklist: Patient identified, Patient being monitored, Timeout performed, Emergency Drugs available and Suction available Patient Re-evaluated:Patient Re-evaluated prior to induction Oxygen Delivery Method: Circle system utilized Preoxygenation: Pre-oxygenation with 100% oxygen Induction Type: IV induction Ventilation: Mask ventilation without difficulty Laryngoscope Size: Mac and 3 Grade View: Grade I Tube type: Oral Tube size: 7.0 mm Number of attempts: 1 Airway Equipment and Method: Stylet Placement Confirmation: ETT inserted through vocal cords under direct vision,  positive ETCO2 and breath sounds checked- equal and bilateral Secured at: 21 cm Tube secured with: Tape Dental Injury: Teeth and Oropharynx as per pre-operative assessment

## 2016-10-01 NOTE — OR Nursing (Signed)
8:50 Dr. Bary Castilla approved placement of IV in right antecubital

## 2016-10-01 NOTE — Anesthesia Post-op Follow-up Note (Cosign Needed)
Anesthesia QCDR form completed.        

## 2016-10-01 NOTE — Discharge Instructions (Signed)

## 2016-10-01 NOTE — Progress Notes (Signed)
Pt dangled on the side of the bed and ambulated to the bathroom. Pt did not want to ambulate in the hallway at this time due to the "pain medication making her sleepy". RN will continue to encourage ambulation.   Jaramiah Bossard [employer]

## 2016-10-01 NOTE — H&P (Signed)
Patient with multifocal left breast cancer, invasive lobular and 2 areas of abnormality in the right breast suspicious for lobular carcinoma. Plans are to proceed with left modified radical mastectomy due to positive nodal status and a right simple mastectomy with sentinel node biopsy.

## 2016-10-01 NOTE — Progress Notes (Signed)
Oral airway removed

## 2016-10-01 NOTE — Addendum Note (Signed)
Addendum  created 10/01/16 1439 by Emmie Niemann, MD   Order list changed, Order sets accessed

## 2016-10-02 ENCOUNTER — Encounter: Payer: Self-pay | Admitting: General Surgery

## 2016-10-02 DIAGNOSIS — Z880 Allergy status to penicillin: Secondary | ICD-10-CM | POA: Diagnosis not present

## 2016-10-02 DIAGNOSIS — Z8673 Personal history of transient ischemic attack (TIA), and cerebral infarction without residual deficits: Secondary | ICD-10-CM | POA: Diagnosis not present

## 2016-10-02 DIAGNOSIS — G4730 Sleep apnea, unspecified: Secondary | ICD-10-CM | POA: Diagnosis not present

## 2016-10-02 DIAGNOSIS — Z888 Allergy status to other drugs, medicaments and biological substances status: Secondary | ICD-10-CM | POA: Diagnosis not present

## 2016-10-02 DIAGNOSIS — C50912 Malignant neoplasm of unspecified site of left female breast: Secondary | ICD-10-CM | POA: Diagnosis not present

## 2016-10-02 DIAGNOSIS — R001 Bradycardia, unspecified: Secondary | ICD-10-CM | POA: Diagnosis not present

## 2016-10-02 DIAGNOSIS — N6011 Diffuse cystic mastopathy of right breast: Secondary | ICD-10-CM | POA: Diagnosis not present

## 2016-10-02 DIAGNOSIS — Z882 Allergy status to sulfonamides status: Secondary | ICD-10-CM | POA: Diagnosis not present

## 2016-10-02 DIAGNOSIS — K219 Gastro-esophageal reflux disease without esophagitis: Secondary | ICD-10-CM | POA: Diagnosis not present

## 2016-10-02 DIAGNOSIS — E119 Type 2 diabetes mellitus without complications: Secondary | ICD-10-CM | POA: Diagnosis not present

## 2016-10-02 DIAGNOSIS — Z886 Allergy status to analgesic agent status: Secondary | ICD-10-CM | POA: Diagnosis not present

## 2016-10-02 DIAGNOSIS — I10 Essential (primary) hypertension: Secondary | ICD-10-CM | POA: Diagnosis not present

## 2016-10-02 DIAGNOSIS — J45909 Unspecified asthma, uncomplicated: Secondary | ICD-10-CM | POA: Diagnosis not present

## 2016-10-02 DIAGNOSIS — Z91018 Allergy to other foods: Secondary | ICD-10-CM | POA: Diagnosis not present

## 2016-10-02 DIAGNOSIS — Z79899 Other long term (current) drug therapy: Secondary | ICD-10-CM | POA: Diagnosis not present

## 2016-10-02 DIAGNOSIS — Z7982 Long term (current) use of aspirin: Secondary | ICD-10-CM | POA: Diagnosis not present

## 2016-10-02 DIAGNOSIS — C773 Secondary and unspecified malignant neoplasm of axilla and upper limb lymph nodes: Secondary | ICD-10-CM | POA: Diagnosis not present

## 2016-10-02 DIAGNOSIS — M1990 Unspecified osteoarthritis, unspecified site: Secondary | ICD-10-CM | POA: Diagnosis not present

## 2016-10-02 MED ORDER — HYDROCODONE-ACETAMINOPHEN 5-325 MG PO TABS: 1.0000 | ORAL_TABLET | ORAL | Status: DC | PRN

## 2016-10-02 NOTE — Progress Notes (Signed)
Patient seen by Dr. Bary Castilla this Am and discharged per doctors ordered, prescription and follow up appointments given as ordered. IV discontinued to right AC. Dressing anf JP drains remain in place. Daughter given reinforced instruction on how to empty  JP drains and performed return demonstration well. Patient denies pain at this time no acute distress noted.

## 2016-10-02 NOTE — Care Management Obs Status (Signed)
[place] NOTIFICATION   Patient Details  Name: Norma Johns MRN: [id number] Date of Birth: 1940-11-27   Medicare Observation Status Notification Given:  No (admitted obs less than 24 hours)    Beverly Sessions, RN 10/02/2016, 10:22 AM

## 2016-10-02 NOTE — Final Progress Note (Signed)
AVSS. Pain markedly improved.  Bradycardia recorded, no past history. Patient without symptoms. Lungs: Clear. Dressing: Dry. JP: [place]. Home today.

## 2016-10-04 ENCOUNTER — Ambulatory Visit (INDEPENDENT_AMBULATORY_CARE_PROVIDER_SITE_OTHER): Payer: Medicare Other | Admitting: General Surgery

## 2016-10-04 ENCOUNTER — Encounter: Payer: Self-pay | Admitting: General Surgery

## 2016-10-04 DIAGNOSIS — C50912 Malignant neoplasm of unspecified site of left female breast: Secondary | ICD-10-CM

## 2016-10-04 NOTE — Patient Instructions (Signed)
Return in one week.  

## 2016-10-04 NOTE — Progress Notes (Signed)
Patient ID: Norma Johns, female   DOB: Jan 10, 1941, 76 y.o.   MRN: [id number]  Chief Complaint  Patient presents with  . Routine Post Op    HPI Norma Johns is a 76 y.o. female here today for her post op bilateral  mastectomy don eon 10/01/2016. Patient states she is doing well.  The patient was held for overnight observation for pain during the first few hours after surgery which has significantly improved.  HPI  Past Medical History:  Diagnosis Date  . Anemia   . Arthritis   . Asthma    WELL CONTROLLED  . Depression   . Diabetes mellitus without complication (HCC)    diet controlled  . GERD (gastroesophageal reflux disease)   . Headache    H/O  . Hyperlipidemia   . Hypertension    H/O  . Sleep apnea    DOES NOT USE CPAP-COULD NOT TOLERATE  . Stroke [facility]) 2004    Past Surgical History:  Procedure Laterality Date  . ABDOMINAL HYSTERECTOMY    . BLADDER REPAIR    . BREAST BIOPSY Right 03/23/1991   Fibroadenoma with intramammary lymph node. Right breast, 9:00.  Marland Kitchen CHOLECYSTECTOMY    . COLONOSCOPY  2005  . MASTECTOMY MODIFIED RADICAL Left 10/01/2016   Procedure: MASTECTOMY MODIFIED RADICAL;  Surgeon: Robert Bellow, MD;  Location: [facility] ORS;  Service: General;  Laterality: Left;  . MR MRA CAROTID  02/2010   Minimal plaque formation; no significant stenosis. Sx: Syncope  . [facility]  . SENTINEL NODE BIOPSY Right 10/01/2016   Procedure: SENTINEL NODE BIOPSY;  Surgeon: Robert Bellow, MD;  Location: [facility] ORS;  Service: General;  Laterality: Right;  . SIMPLE MASTECTOMY WITH AXILLARY SENTINEL NODE BIOPSY Right 10/01/2016   Procedure: SIMPLE MASTECTOMY;  Surgeon: Robert Bellow, MD;  Location: [facility] ORS;  Service: General;  Laterality: Right;    Family History  Problem Relation Age of Onset  . Cancer Father   . Prostate cancer Father   . Heart attack Mother   . Hypertension Mother   . CAD Mother   . Brain cancer Daughter   .  Hypertension Son   . Diabetes Son   . Lung cancer Brother   . Leukemia Brother   . Prostate cancer Brother   . Breast cancer Neg Hx     Social History Social History  Substance Use Topics  . Smoking status: Never Smoker  . Smokeless tobacco: Never Used  . Alcohol use No    Allergies  Allergen Reactions  . Chocolate Shortness Of Breath  . Other Shortness Of Breath and Swelling    PT STATES ALLERGY TO "SOME TYPE OF DYE THAT WAS USED AT [facility]  SWELLING TO ARM AND SOB.  PT CANNOT REMEMBER WHAT THE DYE WAS USED FOR.  . Sulfa Antibiotics Other (See Comments)    unknown  . Atorvastatin Other (See Comments)    Leg pain   . Minocycline Hcl Itching  . Naproxen Other (See Comments)    Indigestion  . Niacin Other (See Comments)    Felt like she was on fire  . Septra [Sulfamethoxazole-Trimethoprim] Other (See Comments)    unknown  . Vioxx [Rofecoxib] Other (See Comments)    unknown  . Penicillins Itching and Rash    Has patient had a PCN reaction causing immediate rash, facial/tongue/throat swelling, SOB or lightheadedness with hypotension: Unknown Has patient had a PCN reaction causing severe rash  involving mucus membranes or skin necrosis: Unknown Has patient had a PCN reaction that required hospitalization: No Has patient had a PCN reaction occurring within the last 10 years: No If all of the above answers are "NO", then may proceed with Cephalosporin use.     Current Outpatient Prescriptions  Medication Sig Dispense Refill  . acetaminophen (TYLENOL) 500 MG tablet Take 1,000 mg by mouth every 6 (six) hours as needed for mild pain or headache.    . albuterol (PROVENTIL HFA;VENTOLIN HFA) 108 (90 Base) MCG/ACT inhaler Inhale 2 puffs into the lungs every 4 (four) hours as needed for wheezing or shortness of breath. 1 Inhaler 11  . ALPRAZolam (XANAX) 0.5 MG tablet Take 1 tablet (0.5 mg total) by mouth at bedtime as needed for anxiety. (Patient taking differently: Take  0.5 mg by mouth as needed for anxiety. ) 30 tablet 5  . aspirin EC 81 MG tablet Take 1 tablet (81 mg total) by mouth daily. 1 tablet 0  . fexofenadine (ALLEGRA) 180 MG tablet Take 180 mg by mouth 3 (three) times a week. PRN    . fluticasone (FLONASE) 50 MCG/ACT nasal spray Place 2 sprays into both nostrils daily. (Patient taking differently: Place 2 sprays into both nostrils 2 (two) times daily as needed for allergies. ) 16 g 5  . HYDROcodone-acetaminophen (NORCO) 5-325 MG tablet Take 1-2 tablets by mouth every 4 (four) hours as needed for moderate pain. Take no more than 10 tablets daily. 30 tablet 0  . Multiple Vitamin (MULTIVITAMIN) capsule Take 1 capsule by mouth daily.    . pantoprazole (PROTONIX) 40 MG tablet Take 1 tablet (40 mg total) by mouth 2 (two) times daily before a meal. 180 tablet 1  . polyethylene glycol (MIRALAX / GLYCOLAX) packet Take 17 g by mouth daily as needed for mild constipation.    . predniSONE (STERAPRED UNI-PAK 21 TAB) 10 MG (21) TBPK tablet Take as directed on package instructions; 6 day taper 21 tablet 0  . vitamin B-12 (CYANOCOBALAMIN) 1000 MCG tablet Take 1,000 mcg by mouth daily.    Marland Kitchen levofloxacin (LEVAQUIN) 500 MG tablet Take 1 tablet (500 mg total) by mouth daily. 10 tablet 0   No current facility-administered medications for this visit.     Review of Systems Review of Systems  Constitutional: Negative.   Respiratory: Negative.   Cardiovascular: Negative.     Blood pressure 140/82, pulse 70, resp. rate 14, height 5\' 3"  (1.6 m), weight 166 lb (75.3 kg). Weight 4 pounds down postmastectomy. Physical Exam Physical Exam  Constitutional: She is oriented to person, place, and time. She appears well-developed and well-nourished.  Pulmonary/Chest:    Neurological: She is alert and oriented to person, place, and time.  Skin: Skin is warm and dry.    Data Reviewed DIAGNOSIS:  A. LEFT BREAST AND AXILLARY CONTENTS; MODIFIED RADICAL MASTECTOMY:  - INVASIVE  CARCINOMA WITH DUCTAL AND LOBULAR FEATURES.  - LARGEST FOCUS OF INVASIVE CARCINOMA MEASURES 10 MM.  - NINETEEN OF NINETEEN LYMPH NODES POSITIVE FOR METASTASIS (19/19).  - BIOPSY SITE CHANGES, MARKER CLIP PRESENT.  - SURGICAL MARGINS ARE NEGATIVE.   B. RIGHT BREAST; SIMPLE MASTECTOMY:  - FIBROCYSTIC CHANGE.  - NEGATIVE FOR ATYPIA AND MALIGNANCY.   C. SENTINEL LYMPH NODE 1, RIGHT; EXCISION:  - NO TUMOR SEEN IN ONE LYMPH NODE (0/1).     Drainage is running about 50-70 mL per day for each side, left slightly greater than right.   Assessment    Doing  well status post left modified radical mastectomy, right simple mastectomy with sentinel node biopsy.    Plan    The patient will continue to work on shoulder range of motion. She may shower. Proper drain care technique reviewed. (Support during showers).   Patient to return in one weeks. Needs to get a CA-27.29 before her visit.   HPI, Physical Exam, Assessment and Plan have been scribed under the direction and in the presence of Hervey Ard, MD.  Gaspar Cola, CMA  I have completed the exam and reviewed the above documentation for accuracy and completeness.  I agree with the above.  Haematologist has been used and any errors in dictation or transcription are unintentional.  Hervey Ard, M.D., F.A.C.S.  Robert Bellow 10/05/2016, 5:52 PM  Patient requested having labs drawn at [facility] instead of [facility]. New order in EPIC per patient's request.   Dominga Ferry, CMA

## 2016-10-05 ENCOUNTER — Telehealth: Payer: Self-pay | Admitting: Physician Assistant

## 2016-10-05 DIAGNOSIS — J0140 Acute pansinusitis, unspecified: Secondary | ICD-10-CM

## 2016-10-05 MED ORDER — LEVOFLOXACIN 500 MG PO TABS: 500.0000 mg | ORAL_TABLET | Freq: Every day | ORAL | 0 refills | Status: DC

## 2016-10-05 NOTE — Telephone Encounter (Signed)
Advised patient as below. She sends her thanks.

## 2016-10-05 NOTE — Telephone Encounter (Signed)
Levaquin 10 day sent to [facility]

## 2016-10-05 NOTE — Telephone Encounter (Signed)
Patient states that you gave her levaquin for a sinus infection and she got somewhat better but now is starting to feel like it is getting worse again.  She is wanting to know if you can send in another round of the antibiotic for her.  She had surgery on Monday and states that she is not able to come in and doesn't want this to get any worse.  She would like someone to call her and let her know if this is done so they can bring the medication the same time they bring her husbands today.  She uses [facility].

## 2016-10-08 ENCOUNTER — Other Ambulatory Visit: Payer: Self-pay

## 2016-10-08 ENCOUNTER — Telehealth: Payer: Self-pay | Admitting: *Deleted

## 2016-10-08 DIAGNOSIS — C50512 Malignant neoplasm of lower-outer quadrant of left female breast: Secondary | ICD-10-CM

## 2016-10-08 DIAGNOSIS — Z170 Estrogen receptor positive status [ER+]: Principal | ICD-10-CM

## 2016-10-08 NOTE — Telephone Encounter (Signed)
Brooke called from [facility] to let us know patient has been scheduled with Dr. Tasia Catchings on 10/22/16 at 9:00am. She reports she called the patient but was confused when she spoke with her regarding the appointment. She will be mailed a new patient packet from the [place]. Please be advised and confirm patient aware.

## 2016-10-09 ENCOUNTER — Other Ambulatory Visit
Admission: RE | Admit: 2016-10-09 | Discharge: 2016-10-09 | Disposition: A | Discharge status: Home / Self Care | Payer: Medicare Other | Source: Ambulatory Visit | Attending: General Surgery | Admitting: General Surgery

## 2016-10-09 DIAGNOSIS — C50912 Malignant neoplasm of unspecified site of left female breast: Secondary | ICD-10-CM | POA: Diagnosis not present

## 2016-10-09 NOTE — Telephone Encounter (Signed)
Patient's daughter notified of upcoming appointments with Dr Tasia Catchings and Dr Baruch Gouty on 10/22/16 at 9:00 am.

## 2016-10-10 ENCOUNTER — Encounter: Payer: Self-pay | Admitting: General Surgery

## 2016-10-10 ENCOUNTER — Ambulatory Visit: Payer: Medicare Other | Admitting: General Surgery

## 2016-10-10 VITALS — BP 140/80 | HR 59 | Resp 14 | Ht 63.0 in | Wt 164.0 lb

## 2016-10-10 DIAGNOSIS — Z170 Estrogen receptor positive status [ER+]: Principal | ICD-10-CM

## 2016-10-10 DIAGNOSIS — C50512 Malignant neoplasm of lower-outer quadrant of left female breast: Secondary | ICD-10-CM

## 2016-10-10 LAB — CANCER ANTIGEN 27.29: CAN 27.29: 203.4 U/mL — AB (ref 0.0–38.6)

## 2016-10-10 NOTE — Patient Instructions (Addendum)
Patient to return in one week. 

## 2016-10-10 NOTE — Progress Notes (Signed)
Patient ID: Norma Johns, female   DOB: Sep 29, 1940, 76 y.o.   MRN: [id number]  Chief Complaint  Patient presents with  . Routine Post Op    bilateral mastectomy     HPI Norma Johns is a 76 y.o. female  Here today for er follow up bilateral mastectomy done on 10/01/2016. Patient states she is very sore. Drain sheet present.                                                                                                        Marland KitchenHPI   Past Medical History:  Diagnosis Date  . Anemia   . Arthritis   . Asthma    WELL CONTROLLED  . Depression   . Diabetes mellitus without complication (HCC)    diet controlled  . GERD (gastroesophageal reflux disease)   . Headache    H/O  . Hyperlipidemia   . Hypertension    H/O  . Sleep apnea    DOES NOT USE CPAP-COULD NOT TOLERATE  . Stroke [facility]) 2004    Past Surgical History:  Procedure Laterality Date  . ABDOMINAL HYSTERECTOMY    . BLADDER REPAIR    . BREAST BIOPSY Right 03/23/1991   Fibroadenoma with intramammary lymph node. Right breast, 9:00.  Marland Kitchen CHOLECYSTECTOMY    . COLONOSCOPY  2005  . MASTECTOMY MODIFIED RADICAL Left 10/01/2016   Procedure: MASTECTOMY MODIFIED RADICAL;  Surgeon: Robert Bellow, MD;  Location: [facility] ORS;  Service: General;  Laterality: Left;  . MR MRA CAROTID  02/2010   Minimal plaque formation; no significant stenosis. Sx: Syncope  . [facility]  . SENTINEL NODE BIOPSY Right 10/01/2016   Procedure: SENTINEL NODE BIOPSY;  Surgeon: Robert Bellow, MD;  Location: [facility] ORS;  Service: General;  Laterality: Right;  . SIMPLE MASTECTOMY WITH AXILLARY SENTINEL NODE BIOPSY Right 10/01/2016   Procedure: SIMPLE MASTECTOMY;  Surgeon: Robert Bellow, MD;  Location: [facility] ORS;  Service: General;  Laterality: Right;    Family History  Problem Relation Age of Onset  . Cancer Father   . Prostate cancer Father   . Heart attack Mother   . Hypertension Mother   . CAD Mother   . Brain cancer  Daughter   . Hypertension Son   . Diabetes Son   . Lung cancer Brother   . Leukemia Brother   . Prostate cancer Brother   . Breast cancer Neg Hx     Social History Social History  Substance Use Topics  . Smoking status: Never Smoker  . Smokeless tobacco: Never Used  . Alcohol use No    Allergies  Allergen Reactions  . Chocolate Shortness Of Breath  . Other Shortness Of Breath and Swelling    PT STATES ALLERGY TO "SOME TYPE OF DYE THAT WAS USED AT [facility]."  SWELLING TO ARM AND SOB.  PT CANNOT REMEMBER WHAT THE DYE WAS USED FOR.  . Sulfa Antibiotics Other (See Comments)    unknown  . Atorvastatin Other (See Comments)  Leg pain   . Minocycline Hcl Itching  . Naproxen Other (See Comments)    Indigestion  . Niacin Other (See Comments)    Felt like she was on fire  . Septra [Sulfamethoxazole-Trimethoprim] Other (See Comments)    unknown  . Vioxx [Rofecoxib] Other (See Comments)    unknown  . Penicillins Itching and Rash    Has patient had a PCN reaction causing immediate rash, facial/tongue/throat swelling, SOB or lightheadedness with hypotension: Unknown Has patient had a PCN reaction causing severe rash involving mucus membranes or skin necrosis: Unknown Has patient had a PCN reaction that required hospitalization: No Has patient had a PCN reaction occurring within the last 10 years: No If all of the above answers are "NO", then may proceed with Cephalosporin use.     Current Outpatient Prescriptions  Medication Sig Dispense Refill  . acetaminophen (TYLENOL) 500 MG tablet Take 1,000 mg by mouth every 6 (six) hours as needed for mild pain or headache.    . albuterol (PROVENTIL HFA;VENTOLIN HFA) 108 (90 Base) MCG/ACT inhaler Inhale 2 puffs into the lungs every 4 (four) hours as needed for wheezing or shortness of breath. 1 Inhaler 11  . ALPRAZolam (XANAX) 0.5 MG tablet Take 1 tablet (0.5 mg total) by mouth at bedtime as needed for anxiety. (Patient taking  differently: Take 0.5 mg by mouth as needed for anxiety. ) 30 tablet 5  . aspirin EC 81 MG tablet Take 1 tablet (81 mg total) by mouth daily. 1 tablet 0  . fexofenadine (ALLEGRA) 180 MG tablet Take 180 mg by mouth 3 (three) times a week. PRN    . fluticasone (FLONASE) 50 MCG/ACT nasal spray Place 2 sprays into both nostrils daily. (Patient taking differently: Place 2 sprays into both nostrils 2 (two) times daily as needed for allergies. ) 16 g 5  . HYDROcodone-acetaminophen (NORCO) 5-325 MG tablet Take 1-2 tablets by mouth every 4 (four) hours as needed for moderate pain. Take no more than 10 tablets daily. 30 tablet 0  . levofloxacin (LEVAQUIN) 500 MG tablet Take 1 tablet (500 mg total) by mouth daily. 10 tablet 0  . Multiple Vitamin (MULTIVITAMIN) capsule Take 1 capsule by mouth daily.    . pantoprazole (PROTONIX) 40 MG tablet Take 1 tablet (40 mg total) by mouth 2 (two) times daily before a meal. 180 tablet 1  . polyethylene glycol (MIRALAX / GLYCOLAX) packet Take 17 g by mouth daily as needed for mild constipation.    . predniSONE (STERAPRED UNI-PAK 21 TAB) 10 MG (21) TBPK tablet Take as directed on package instructions; 6 day taper 21 tablet 0  . vitamin B-12 (CYANOCOBALAMIN) 1000 MCG tablet Take 1,000 mcg by mouth daily.     No current facility-administered medications for this visit.     Review of Systems Review of Systems  Constitutional: Negative.   Respiratory: Negative.   Cardiovascular: Negative.    Right arm 120 Left arm 110  Blood pressure 140/80, pulse (!) 59, resp. rate 14, height 5\' 3"  (1.6 m), weight 164 lb (74.4 kg).  Physical Exam Physical Exam  Constitutional: She is oriented to person, place, and time. She appears well-developed and well-nourished.  Pulmonary/Chest:    Neurological: She is alert and oriented to person, place, and time.  Skin: Skin is warm and dry.    Data Reviewed CA 27-29 fell from 232 to evaluate of 203 at 1 week postop. Less than 15%  decrease.  Assessment    Steady recovery post bilateral  mastectomy, left axillary dissection.    Plan    Importance of working on shoulder range of motion reviewed.  Recommendations from [facility] breast tumor conference for adjuvant chemotherapy and radiation discussed. Patient has reluctantly agreed to meet with medical oncology.  Appointments with Dr. Tasia Catchings and [place] scheduled for 10/22/2016.     Patient to return in one week. The patient is aware to call back for any questions or concerns.   HPI, Physical Exam, Assessment and Plan have been scribed under the direction and in the presence of Hervey Ard, MD.  Gaspar Cola, CMA  I have completed the exam and reviewed the above documentation for accuracy and completeness.  I agree with the above.  Haematologist has been used and any errors in dictation or transcription are unintentional.  Hervey Ard, M.D., F.A.C.S.  Robert Bellow 10/10/2016, 8:46 PM

## 2016-10-17 ENCOUNTER — Encounter: Payer: Self-pay | Admitting: Intensive Care

## 2016-10-17 ENCOUNTER — Emergency Department: Payer: Medicare Other

## 2016-10-17 ENCOUNTER — Emergency Department
Admission: EM | Admit: 2016-10-17 | Discharge: 2016-10-17 | Disposition: A | Discharge status: Home / Self Care | Payer: Medicare Other | Attending: Emergency Medicine | Admitting: Emergency Medicine

## 2016-10-17 DIAGNOSIS — Z79899 Other long term (current) drug therapy: Secondary | ICD-10-CM | POA: Insufficient documentation

## 2016-10-17 DIAGNOSIS — Y999 Unspecified external cause status: Secondary | ICD-10-CM | POA: Insufficient documentation

## 2016-10-17 DIAGNOSIS — G44319 Acute post-traumatic headache, not intractable: Secondary | ICD-10-CM | POA: Insufficient documentation

## 2016-10-17 DIAGNOSIS — I10 Essential (primary) hypertension: Secondary | ICD-10-CM | POA: Diagnosis not present

## 2016-10-17 DIAGNOSIS — Y92410 Unspecified street and highway as the place of occurrence of the external cause: Secondary | ICD-10-CM | POA: Insufficient documentation

## 2016-10-17 DIAGNOSIS — S161XXA Strain of muscle, fascia and tendon at neck level, initial encounter: Secondary | ICD-10-CM

## 2016-10-17 DIAGNOSIS — J45909 Unspecified asthma, uncomplicated: Secondary | ICD-10-CM | POA: Diagnosis not present

## 2016-10-17 DIAGNOSIS — M542 Cervicalgia: Secondary | ICD-10-CM | POA: Diagnosis not present

## 2016-10-17 DIAGNOSIS — S0990XA Unspecified injury of head, initial encounter: Secondary | ICD-10-CM | POA: Diagnosis not present

## 2016-10-17 DIAGNOSIS — S199XXA Unspecified injury of neck, initial encounter: Secondary | ICD-10-CM | POA: Diagnosis not present

## 2016-10-17 DIAGNOSIS — Z7982 Long term (current) use of aspirin: Secondary | ICD-10-CM | POA: Insufficient documentation

## 2016-10-17 DIAGNOSIS — Y9389 Activity, other specified: Secondary | ICD-10-CM | POA: Diagnosis not present

## 2016-10-17 DIAGNOSIS — E119 Type 2 diabetes mellitus without complications: Secondary | ICD-10-CM | POA: Diagnosis not present

## 2016-10-17 DIAGNOSIS — G44309 Post-traumatic headache, unspecified, not intractable: Secondary | ICD-10-CM | POA: Diagnosis not present

## 2016-10-17 DIAGNOSIS — R51 Headache: Secondary | ICD-10-CM | POA: Diagnosis not present

## 2016-10-17 DIAGNOSIS — M546 Pain in thoracic spine: Secondary | ICD-10-CM | POA: Diagnosis not present

## 2016-10-17 MED ORDER — CYCLOBENZAPRINE HCL 5 MG PO TABS: 5.0000 mg | ORAL_TABLET | Freq: Three times a day (TID) | ORAL | 0 refills | Status: DC | PRN

## 2016-10-17 MED ORDER — PREDNISONE 50 MG PO TABS: 50.0000 mg | ORAL_TABLET | Freq: Every day | ORAL | 0 refills | Status: DC

## 2016-10-17 MED ORDER — HYDROCODONE-ACETAMINOPHEN 5-325 MG PO TABS
1.0000 | ORAL_TABLET | Freq: Once | ORAL | Status: AC
  Administered 2016-10-17: 1 via ORAL
  Filled 2016-10-17: qty 1

## 2016-10-17 NOTE — ED Notes (Signed)
[place] collar applied.
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
## 2016-10-17 NOTE — ED Notes (Signed)
Patient transported to CT and XR 
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
## 2016-10-18 ENCOUNTER — Encounter: Payer: Self-pay | Admitting: General Surgery

## 2016-10-18 ENCOUNTER — Ambulatory Visit (INDEPENDENT_AMBULATORY_CARE_PROVIDER_SITE_OTHER): Payer: Medicare Other | Admitting: General Surgery

## 2016-10-18 VITALS — BP 140/80 | HR 80 | Resp 16 | Ht 60.0 in | Wt 168.0 lb

## 2016-10-18 DIAGNOSIS — C50912 Malignant neoplasm of unspecified site of left female breast: Secondary | ICD-10-CM

## 2016-10-18 NOTE — Patient Instructions (Signed)
Return in one week.  

## 2016-10-18 NOTE — Progress Notes (Signed)
Patient ID: Norma Johns, female   DOB: Jul 04, 1940, 76 y.o.   MRN: [id number]  Chief Complaint  Patient presents with  . Routine Post Op    HPI Norma Johns is a 76 y.o. female. Here today for er follow up bilateral mastectomy done on 10/01/2016. Patient states she is very sore. Drain sheet present.   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  She is here with her daughter, Norma Johns.                                                             HPI  Past Medical History:  Diagnosis Date  . Anemia   . Arthritis   . Asthma    WELL CONTROLLED  . Depression   . Diabetes mellitus without complication (HCC)    diet controlled  . GERD (gastroesophageal reflux disease)   . Headache    H/O  . Hyperlipidemia   . Hypertension    H/O  . Sleep apnea    DOES NOT USE CPAP-COULD NOT TOLERATE  . Stroke [facility]) 2004    Past Surgical History:  Procedure Laterality Date  . ABDOMINAL HYSTERECTOMY    . BLADDER REPAIR    . BREAST BIOPSY Right 03/23/1991   Fibroadenoma with intramammary lymph node. Right breast, 9:00.  Marland Kitchen CHOLECYSTECTOMY    . COLONOSCOPY  2005  . MASTECTOMY MODIFIED RADICAL Left 10/01/2016   Procedure: MASTECTOMY MODIFIED RADICAL;  Surgeon: Robert Bellow, MD;  Location: [facility] ORS;  Service: General;  Laterality: Left;  . MR MRA CAROTID  02/2010   Minimal plaque formation; no significant stenosis. Sx: Syncope  . [facility]  . SENTINEL NODE BIOPSY Right 10/01/2016   Procedure: SENTINEL NODE BIOPSY;  Surgeon: Robert Bellow, MD;  Location: [facility] ORS;  Service: General;  Laterality: Right;  . SIMPLE MASTECTOMY WITH AXILLARY SENTINEL NODE BIOPSY Right 10/01/2016   Procedure: SIMPLE MASTECTOMY;  Surgeon: Robert Bellow, MD;  Location: [facility] ORS;  Service: General;  Laterality: Right;    Family History  Problem Relation Age of Onset  . Cancer Father   . Prostate cancer Father   . Heart attack Mother   . Hypertension  Mother   . CAD Mother   . Brain cancer Daughter   . Hypertension Son   . Diabetes Son   . Lung cancer Brother   . Leukemia Brother   . Prostate cancer Brother   . Breast cancer Neg Hx     Social History Social History  Substance Use Topics  . Smoking status: Never Smoker  . Smokeless tobacco: Never Used  . Alcohol use No    Allergies  Allergen Reactions  . Chocolate Shortness Of Breath  . Other Shortness Of Breath and Swelling    PT STATES ALLERGY TO "SOME TYPE OF DYE THAT WAS USED AT [facility]."  SWELLING TO ARM AND SOB.  PT CANNOT REMEMBER WHAT THE DYE WAS USED FOR.  . Sulfa Antibiotics Other (See Comments)    unknown  . Atorvastatin Other (See Comments)    Leg pain   . Minocycline Hcl Itching  . Naproxen Other (See Comments)    Indigestion  . Niacin Other (See Comments)  Felt like she was on fire  . Septra [Sulfamethoxazole-Trimethoprim] Other (See Comments)    unknown  . Vioxx [Rofecoxib] Other (See Comments)    unknown  . Penicillins Itching and Rash    Has patient had a PCN reaction causing immediate rash, facial/tongue/throat swelling, SOB or lightheadedness with hypotension: Unknown Has patient had a PCN reaction causing severe rash involving mucus membranes or skin necrosis: Unknown Has patient had a PCN reaction that required hospitalization: No Has patient had a PCN reaction occurring within the last 10 years: No If all of the above answers are "NO", then may proceed with Cephalosporin use.     Current Outpatient Prescriptions  Medication Sig Dispense Refill  . acetaminophen (TYLENOL) 500 MG tablet Take 1,000 mg by mouth every 6 (six) hours as needed for mild pain or headache.    . albuterol (PROVENTIL HFA;VENTOLIN HFA) 108 (90 Base) MCG/ACT inhaler Inhale 2 puffs into the lungs every 4 (four) hours as needed for wheezing or shortness of breath. 1 Inhaler 11  . ALPRAZolam (XANAX) 0.5 MG tablet Take 1 tablet (0.5 mg total) by mouth at bedtime as  needed for anxiety. (Patient taking differently: Take 0.5 mg by mouth as needed for anxiety. ) 30 tablet 5  . aspirin EC 81 MG tablet Take 1 tablet (81 mg total) by mouth daily. 1 tablet 0  . cyclobenzaprine (FLEXERIL) 5 MG tablet Take 1 tablet (5 mg total) by mouth 3 (three) times daily as needed for muscle spasms. 15 tablet 0  . fexofenadine (ALLEGRA) 180 MG tablet Take 180 mg by mouth 3 (three) times a week. PRN    . fluticasone (FLONASE) 50 MCG/ACT nasal spray Place 2 sprays into both nostrils daily. (Patient taking differently: Place 2 sprays into both nostrils 2 (two) times daily as needed for allergies. ) 16 g 5  . levofloxacin (LEVAQUIN) 500 MG tablet Take 1 tablet (500 mg total) by mouth daily. 10 tablet 0  . Multiple Vitamin (MULTIVITAMIN) capsule Take 1 capsule by mouth daily.    . pantoprazole (PROTONIX) 40 MG tablet Take 1 tablet (40 mg total) by mouth 2 (two) times daily before a meal. 180 tablet 1  . polyethylene glycol (MIRALAX / GLYCOLAX) packet Take 17 g by mouth daily as needed for mild constipation.    . predniSONE (DELTASONE) 50 MG tablet Take 1 tablet (50 mg total) by mouth daily with breakfast. 5 tablet 0  . vitamin B-12 (CYANOCOBALAMIN) 1000 MCG tablet Take 1,000 mcg by mouth daily.     No current facility-administered medications for this visit.     Review of Systems Review of Systems  Constitutional: Negative.   Respiratory: Negative.   Cardiovascular: Negative.     Blood pressure 140/80, pulse 80, resp. rate 16, height 5' (1.524 m), weight 168 lb (76.2 kg).  Physical Exam Physical Exam  Constitutional: She appears well-developed and well-nourished.  Pulmonary/Chest:    Neurological: She is alert.  Skin: Skin is warm and dry.  Drains removed.   Data Reviewed Drain volumes have decreased nicely, 10 mL or less for the last 2-24 hour periods.   Assessment    Doing well status post bilateral mastectomy.    Plan    The patient reports that she is  decided that she is not going to take adjuvant chemotherapy, that is amenable to meet with both radiation oncology and medical oncology for their formal opinion.  She was advised that it is not uncommon to develop a small fluid after  drain removal and this should not provide her any worry.    Patient to return in one week.   HPI, Physical Exam, Assessment and Plan have been scribed under the direction and in the presence of Robert Bellow, MD. Karie Fetch, RN  I have completed the exam and reviewed the above documentation for accuracy and completeness.  I agree with the above.  Haematologist has been used and any errors in dictation or transcription are unintentional.  Hervey Ard, M.D., F.A.C.S.  Robert Bellow 10/19/2016, 5:00 PM

## 2016-10-22 ENCOUNTER — Ambulatory Visit
Admission: RE | Admit: 2016-10-22 | Discharge: 2016-10-22 | Disposition: A | Discharge status: Home / Self Care | Payer: Medicare Other | Source: Ambulatory Visit | Attending: Radiation Oncology | Admitting: Radiation Oncology

## 2016-10-22 ENCOUNTER — Institutional Professional Consult (permissible substitution): Payer: Medicare Other | Admitting: Radiation Oncology

## 2016-10-22 ENCOUNTER — Encounter: Payer: Self-pay | Admitting: Oncology

## 2016-10-22 ENCOUNTER — Inpatient Hospital Stay: Payer: Medicare Other | Attending: Oncology | Admitting: Oncology

## 2016-10-22 ENCOUNTER — Inpatient Hospital Stay: Payer: Medicare Other

## 2016-10-22 VITALS — BP 148/75 | HR 62 | Temp 97.9°F | Resp 18 | Ht 64.25 in | Wt 166.2 lb

## 2016-10-22 DIAGNOSIS — K219 Gastro-esophageal reflux disease without esophagitis: Secondary | ICD-10-CM | POA: Insufficient documentation

## 2016-10-22 DIAGNOSIS — Z8042 Family history of malignant neoplasm of prostate: Secondary | ICD-10-CM | POA: Insufficient documentation

## 2016-10-22 DIAGNOSIS — Z7982 Long term (current) use of aspirin: Secondary | ICD-10-CM | POA: Insufficient documentation

## 2016-10-22 DIAGNOSIS — Z8673 Personal history of transient ischemic attack (TIA), and cerebral infarction without residual deficits: Secondary | ICD-10-CM

## 2016-10-22 DIAGNOSIS — D649 Anemia, unspecified: Secondary | ICD-10-CM | POA: Insufficient documentation

## 2016-10-22 DIAGNOSIS — M549 Dorsalgia, unspecified: Secondary | ICD-10-CM | POA: Diagnosis not present

## 2016-10-22 DIAGNOSIS — M545 Low back pain: Secondary | ICD-10-CM

## 2016-10-22 DIAGNOSIS — E119 Type 2 diabetes mellitus without complications: Secondary | ICD-10-CM | POA: Diagnosis not present

## 2016-10-22 DIAGNOSIS — E785 Hyperlipidemia, unspecified: Secondary | ICD-10-CM | POA: Insufficient documentation

## 2016-10-22 DIAGNOSIS — G4730 Sleep apnea, unspecified: Secondary | ICD-10-CM | POA: Diagnosis not present

## 2016-10-22 DIAGNOSIS — C50512 Malignant neoplasm of lower-outer quadrant of left female breast: Secondary | ICD-10-CM | POA: Diagnosis not present

## 2016-10-22 DIAGNOSIS — G8929 Other chronic pain: Secondary | ICD-10-CM | POA: Insufficient documentation

## 2016-10-22 DIAGNOSIS — M7989 Other specified soft tissue disorders: Secondary | ICD-10-CM | POA: Diagnosis not present

## 2016-10-22 DIAGNOSIS — I10 Essential (primary) hypertension: Secondary | ICD-10-CM | POA: Insufficient documentation

## 2016-10-22 DIAGNOSIS — Z8041 Family history of malignant neoplasm of ovary: Secondary | ICD-10-CM | POA: Insufficient documentation

## 2016-10-22 DIAGNOSIS — J45909 Unspecified asthma, uncomplicated: Secondary | ICD-10-CM | POA: Diagnosis not present

## 2016-10-22 DIAGNOSIS — F419 Anxiety disorder, unspecified: Secondary | ICD-10-CM | POA: Diagnosis not present

## 2016-10-22 DIAGNOSIS — F329 Major depressive disorder, single episode, unspecified: Secondary | ICD-10-CM | POA: Diagnosis not present

## 2016-10-22 DIAGNOSIS — Z9013 Acquired absence of bilateral breasts and nipples: Secondary | ICD-10-CM | POA: Insufficient documentation

## 2016-10-22 DIAGNOSIS — M129 Arthropathy, unspecified: Secondary | ICD-10-CM | POA: Insufficient documentation

## 2016-10-22 DIAGNOSIS — Z801 Family history of malignant neoplasm of trachea, bronchus and lung: Secondary | ICD-10-CM | POA: Diagnosis not present

## 2016-10-22 DIAGNOSIS — C801 Malignant (primary) neoplasm, unspecified: Secondary | ICD-10-CM

## 2016-10-22 DIAGNOSIS — Z803 Family history of malignant neoplasm of breast: Secondary | ICD-10-CM | POA: Insufficient documentation

## 2016-10-22 DIAGNOSIS — D0502 Lobular carcinoma in situ of left breast: Secondary | ICD-10-CM

## 2016-10-22 DIAGNOSIS — Z170 Estrogen receptor positive status [ER+]: Principal | ICD-10-CM

## 2016-10-22 DIAGNOSIS — Z79899 Other long term (current) drug therapy: Secondary | ICD-10-CM | POA: Diagnosis not present

## 2016-10-22 LAB — COMPREHENSIVE METABOLIC PANEL
ALK PHOS: 76 U/L (ref 38–126)
ALT: 11 U/L — AB (ref 14–54)
AST: 15 U/L (ref 15–41)
Albumin: 4 g/dL (ref 3.5–5.0)
Anion gap: 5 (ref 5–15)
BILIRUBIN TOTAL: 0.8 mg/dL (ref 0.3–1.2)
BUN: 11 mg/dL (ref 6–20)
CALCIUM: 9.7 mg/dL (ref 8.9–10.3)
CO2: 28 mmol/L (ref 22–32)
CREATININE: 0.67 mg/dL (ref 0.44–1.00)
Chloride: 103 mmol/L (ref 101–111)
Glucose, Bld: 127 mg/dL — ABNORMAL HIGH (ref 65–99)
Potassium: 4.5 mmol/L (ref 3.5–5.1)
Sodium: 136 mmol/L (ref 135–145)
Total Protein: 6.6 g/dL (ref 6.5–8.1)

## 2016-10-22 LAB — CBC WITH DIFFERENTIAL/PLATELET
Basophils Absolute: 0 10*3/uL (ref 0–0.1)
Basophils Relative: 1 %
EOS PCT: 2 %
Eosinophils Absolute: 0.2 10*3/uL (ref 0–0.7)
HEMATOCRIT: 38.2 % (ref 35.0–47.0)
HEMOGLOBIN: 12.9 g/dL (ref 12.0–16.0)
LYMPHS ABS: 2.2 10*3/uL (ref 1.0–3.6)
LYMPHS PCT: 31 %
MCH: 30.3 pg (ref 26.0–34.0)
MCHC: 33.8 g/dL (ref 32.0–36.0)
MCV: 89.7 fL (ref 80.0–100.0)
Monocytes Absolute: 0.6 10*3/uL (ref 0.2–0.9)
Monocytes Relative: 9 %
NEUTROS ABS: 4.1 10*3/uL (ref 1.4–6.5)
Neutrophils Relative %: 57 %
PLATELETS: 310 10*3/uL (ref 150–440)
RBC: 4.26 MIL/uL (ref 3.80–5.20)
RDW: 14.3 % (ref 11.5–14.5)
WBC: 7.1 10*3/uL (ref 3.6–11.0)

## 2016-10-22 LAB — SURGICAL PATHOLOGY

## 2016-10-22 MED ORDER — ANASTROZOLE 1 MG PO TABS: 1.0000 mg | ORAL_TABLET | Freq: Every day | ORAL | 3 refills | Status: DC

## 2016-10-22 NOTE — Progress Notes (Signed)
Patient here today as a new patient for left breast cancer

## 2016-10-22 NOTE — Consult Note (Signed)
NEW PATIENT EVALUATION  Name: Norma Johns  MRN: [id number]  Date:   10/22/2016     DOB: [birth year]   This 76 y.o. female patient presents to the clinic for initial evaluation of locally advanced stage IIIa lobular carcinoma of the left breast status post bilateral mastectomies.Marland Kitchen  REFERRING PHYSICIAN: Mar Daring, [place]*  CHIEF COMPLAINT: No chief complaint on file.   DIAGNOSIS: The encounter diagnosis was Malignant neoplasm of lower-outer quadrant of left breast of female, estrogen receptor positive ([place]).   PREVIOUS INVESTIGATIONS:  MRI scans mammograms ultrasound reviewed Clinical notes reviewed Pathology reports reviewed  HPI: Patient is a 76 year old female who underwent screening mammography back in May showing at least 2 masses in the left breast at the 4:00 position 3 cm from the nipple in the 4:00 position 6 cm from the nipple. She underwent ultrasound guided biopsy showing invasive lobular carcinoma in both left-sided breast lesions. Lymph node at that time was positive for metastatic lobular carcinoma. She went on to have an MRI scan showing again the 2 masses in left breast which were separated by 3.4 cm of enhancing tissue. She underwent a left modified radical mastectomy. Her right breast also had a simple mastectomy which is negative for atypia or malignancy. Left breast had invasive carcinoma with ductal and lobular features. Interestingly 19 of 19 lymph nodes were positive for metastatic disease. Surgical margins were negative. Largest tumor in the breast was 1 cm. Tumor was overall grade 1 lymphovascular invasion was present. Tumor was a pathologic T1 b N IIIa. Patient has been seen by medical oncology with recognition for systemic chemotherapy which she has declined. She is seen today for consideration of chest wall and peripheral lymphatic radiation to her left chest wall. Her case was presented at our breast tumor conference. She is doing fairly well. She is still  healing from her incisions with Steri-Strips still present. She's having some slight swelling in her left upper extremity.  PLANNED TREATMENT REGIMEN: Left chest wall and peripheral lymphatic radiation  PAST MEDICAL HISTORY:  has a past medical history of Anemia; Anxiety; Arthritis; Asthma; Depression; Diabetes mellitus without complication ([place]); GERD (gastroesophageal reflux disease); Headache; Hyperlipidemia; Hypertension; Sleep apnea; and Stroke ([place]) (2004).    PAST SURGICAL HISTORY:  Past Surgical History:  Procedure Laterality Date  . ABDOMINAL HYSTERECTOMY    . BLADDER REPAIR    . BREAST BIOPSY Right 03/23/1991   Fibroadenoma with intramammary lymph node. Right breast, 9:00.  Marland Kitchen CHOLECYSTECTOMY    . COLONOSCOPY  2005  . MASTECTOMY MODIFIED RADICAL Left 10/01/2016   Procedure: MASTECTOMY MODIFIED RADICAL;  Surgeon: Robert Bellow, MD;  Location: [facility] ORS;  Service: General;  Laterality: Left;  . MR MRA CAROTID  02/2010   Minimal plaque formation; no significant stenosis. Sx: Syncope  . [facility]  . SENTINEL NODE BIOPSY Right 10/01/2016   Procedure: SENTINEL NODE BIOPSY;  Surgeon: Robert Bellow, MD;  Location: [facility] OR[facility];  Service: General;  Laterality: Right;  . SIMPLE MASTECTOMY WITH AXILLARY SENTINEL NODE BIOPSY Right 10/01/2016   Procedure: SIMPLE MASTECTOMY;  Surgeon: Robert Bellow, MD;  Location: [facility] OR[facility];  Service: General;  Laterality: Right;    FAMILY HISTORY: family history includes CAD in her mother; Cancer in her father; Diabetes in her son; Heart attack in her mother; Hypertension in her mother and son; Leukemia in her brother; Lung cancer in her brother and brother; Prostate cancer in her brother and father.  SOCIAL HISTORY:  reports that she has never smoked. She has never used smokeless tobacco. She reports that she does not drink alcohol or use drugs.  ALLERGIES: Chocolate; Other; Sulfa antibiotics; Atorvastatin; Minocycline hcl;  Naproxen; Niacin; Septra [sulfamethoxazole-trimethoprim]; Vioxx [rofecoxib]; and Penicillins  MEDICATIONS:  Current Outpatient Prescriptions  Medication Sig Dispense Refill  . acetaminophen (TYLENOL) 500 MG tablet Take 1,000 mg by mouth every 6 (six) hours as needed for mild pain or headache.    . albuterol (PROVENTIL HFA;VENTOLIN HFA) 108 (90 Base) MCG/ACT inhaler Inhale 2 puffs into the lungs every 4 (four) hours as needed for wheezing or shortness of breath. 1 Inhaler 11  . ALPRAZolam (XANAX) 0.5 MG tablet Take 1 tablet (0.5 mg total) by mouth at bedtime as needed for anxiety. (Patient taking differently: Take 0.5 mg by mouth as needed for anxiety. ) 30 tablet 5  . anastrozole (ARIMIDEX) 1 MG tablet Take 1 tablet (1 mg total) by mouth daily. 30 tablet 3  . aspirin EC 81 MG tablet Take 1 tablet (81 mg total) by mouth daily. 1 tablet 0  . cyclobenzaprine (FLEXERIL) 5 MG tablet Take 1 tablet (5 mg total) by mouth 3 (three) times daily as needed for muscle spasms. 15 tablet 0  . fexofenadine (ALLEGRA) 180 MG tablet Take 180 mg by mouth 3 (three) times a week. PRN    . fluticasone (FLONASE) 50 MCG/ACT nasal spray Place 2 sprays into both nostrils daily. (Patient taking differently: Place 2 sprays into both nostrils 2 (two) times daily as needed for allergies. ) 16 g 5  . Multiple Vitamin (MULTIVITAMIN) capsule Take 1 capsule by mouth daily.    . pantoprazole (PROTONIX) 40 MG tablet Take 1 tablet (40 mg total) by mouth 2 (two) times daily before a meal. 180 tablet 1  . polyethylene glycol (MIRALAX / GLYCOLAX) packet Take 17 g by mouth daily as needed for mild constipation.    . vitamin B-12 (CYANOCOBALAMIN) 1000 MCG tablet Take 1,000 mcg by mouth daily.     No current facility-administered medications for this encounter.     ECOG PERFORMANCE STATUS:  0 - Asymptomatic  REVIEW OF SYSTEMS:  Patient denies any weight loss, fatigue, weakness, fever, chills or night sweats. Patient denies any loss of  vision, blurred vision. Patient denies any ringing  of the ears or hearing loss. No irregular heartbeat. Patient denies heart murmur or history of fainting. Patient denies any chest pain or pain radiating to her upper extremities. Patient denies any shortness of breath, difficulty breathing at night, cough or hemoptysis. Patient denies any swelling in the lower legs. Patient denies any nausea vomiting, vomiting of blood, or coffee ground material in the vomitus. Patient denies any stomach pain. Patient states has had normal bowel movements no significant constipation or diarrhea. Patient denies any dysuria, hematuria or significant nocturia. Patient denies any problems walking, swelling in the joints or loss of balance. Patient denies any skin changes, loss of hair or loss of weight. Patient denies any excessive worrying or anxiety or significant depression. Patient denies any problems with insomnia. Patient denies excessive thirst, polyuria, polydipsia. Patient denies any swollen glands, patient denies easy bruising or easy bleeding. Patient denies any recent infections, allergies or URI. Patient "s visual fields have not changed significantly in recent time.    PHYSICAL EXAM: There were no vitals taken for this visit. Patient is status post bilateral mastectomies. Incisions are healing well. No chest wall mass or nodularity is noted. No evidence of mass  or nodularity in either axilla or supraclavicular region. There is trace lymphedema of her left upper extremity. Well-developed well-nourished patient in NAD. HEENT reveals PERLA, EOMI, discs not visualized.  Oral cavity is clear. No oral mucosal lesions are identified. Neck is clear without evidence of cervical or supraclavicular adenopathy. Lungs are clear to A&P. Cardiac examination is essentially unremarkable with regular rate and rhythm without murmur rub or thrill. Abdomen is benign with no organomegaly or masses noted. Motor sensory and DTR levels are  equal and symmetric in the upper and lower extremities. Cranial nerves II through XII are grossly intact. Proprioception is intact. No peripheral adenopathy or edema is identified. No motor or sensory levels are noted. Crude visual fields are within normal range.  LABORATORY DATA: Pathology reports reviewed    RADIOLOGY RESULTS: Mammograms MRI and ultrasound reviewed   IMPRESSION: Stage III locally advanced invasive mammary carcinoma with lobular and ductal features status post bilateral mastectomies in 76 year old female.  PLAN: Based on her significant lymph node involvement of the left breast I would recommend based on a poor prognostic feature left chest wall peripheral lymphatic radiation. Would treat both areas to 5040 cGy in 28 fractions. Would also boost her axilla to get a minimum dose of 5042 her axilla with a posterior axillary boost. I do not feel we need to treat her scar boost since there was minimal disease in her breast and margins were clear. I personally ordered and scheduled CT simulation after her bone scan is complete to make sure we are not dealing with stage IV disease. Risks and benefits of treatment including skin reaction fatigue alteration of blood counts possible inclusion of superficial lung and possibility of lymphedema of her left upper extremity all were discussed in detail with the patient and her daughter. My rationale for using adjuvant treatment was also reinforced. Should she have stage IV disease will refer back to medical oncology for evaluation.  I would like to take this opportunity to thank you for allowing me to participate in the care of your patient.Armstead Peaks., MD
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
## 2016-10-24 ENCOUNTER — Ambulatory Visit (INDEPENDENT_AMBULATORY_CARE_PROVIDER_SITE_OTHER): Payer: Medicare Other | Admitting: General Surgery

## 2016-10-24 ENCOUNTER — Encounter: Payer: Self-pay | Admitting: General Surgery

## 2016-10-24 VITALS — BP 132/78 | HR 76 | Resp 10 | Ht 63.0 in | Wt 165.0 lb

## 2016-10-24 DIAGNOSIS — C50912 Malignant neoplasm of unspecified site of left female breast: Secondary | ICD-10-CM

## 2016-10-24 NOTE — Progress Notes (Signed)
Patient ID: Norma Johns, female   DOB: [birth year], 76 y.o.   MRN: [id number]  Chief Complaint  Patient presents with  . Routine Post Op    HPI Norma Johns is a 76 y.o. female.  Here today for er follow up bilateral mastectomy done on 10/01/2016. Patient states she is still very sore, especially in the right axilla, left upper arm and across the sternum. She states she is "cold" , no chills just feels cold. She is here with her daughter, Norma Johns.  HPI  Past Medical History:  Diagnosis Date  . Anemia   . Anxiety   . Arthritis   . Asthma    WELL CONTROLLED  . Depression   . Diabetes mellitus without complication (HCC)    diet controlled  . GERD (gastroesophageal reflux disease)   . Headache    H/O  . Hyperlipidemia   . Hypertension    H/O  . Sleep apnea    DOES NOT USE CPAP-COULD NOT TOLERATE  . Stroke [facility]) 2004    Past Surgical History:  Procedure Laterality Date  . ABDOMINAL HYSTERECTOMY    . BLADDER REPAIR    . BREAST BIOPSY Right 03/23/1991   Fibroadenoma with intramammary lymph node. Right breast, 9:00.  Marland Kitchen CHOLECYSTECTOMY    . COLONOSCOPY  2005  . MASTECTOMY MODIFIED RADICAL Left 10/01/2016   Procedure: MASTECTOMY MODIFIED RADICAL;  Surgeon: Robert Bellow, MD;  Location: [facility] ORS;  Service: General;  Laterality: Left;  . MR MRA CAROTID  02/2010   Minimal plaque formation; no significant stenosis. Sx: Syncope  . [facility]  . SENTINEL NODE BIOPSY Right 10/01/2016   Procedure: SENTINEL NODE BIOPSY;  Surgeon: Robert Bellow, MD;  Location: [facility] ORS;  Service: General;  Laterality: Right;  . SIMPLE MASTECTOMY WITH AXILLARY SENTINEL NODE BIOPSY Right 10/01/2016   Procedure: SIMPLE MASTECTOMY;  Surgeon: Robert Bellow, MD;  Location: [facility] ORS;  Service: General;  Laterality: Right;    Family History  Problem Relation Age of Onset  . Cancer Father   . Prostate cancer Father   . Heart attack Mother   . Hypertension  Mother   . CAD Mother   . Hypertension Son   . Diabetes Son   . Lung cancer Brother   . Leukemia Brother   . Lung cancer Brother   . Prostate cancer Brother   . Breast cancer Neg Hx     Social History Social History  Substance Use Topics  . Smoking status: Never Smoker  . Smokeless tobacco: Never Used  . Alcohol use No    Allergies  Allergen Reactions  . Chocolate Shortness Of Breath  . Other Shortness Of Breath and Swelling    PT STATES ALLERGY TO "SOME TYPE OF DYE THAT WAS USED AT [facility]."  SWELLING TO ARM AND SOB.  PT CANNOT REMEMBER WHAT THE DYE WAS USED FOR.  . Sulfa Antibiotics Other (See Comments)    unknown  . Atorvastatin Other (See Comments)    Leg pain   . Minocycline Hcl Itching  . Naproxen Other (See Comments)    Indigestion  . Niacin Other (See Comments)    Felt like she was on fire  . Septra [Sulfamethoxazole-Trimethoprim] Other (See Comments)    unknown  . Vioxx [Rofecoxib] Other (See Comments)    unknown  . Penicillins Itching and Rash    Has patient had a PCN reaction causing immediate rash, facial/tongue/throat swelling,  SOB or lightheadedness with hypotension: Unknown Has patient had a PCN reaction causing severe rash involving mucus membranes or skin necrosis: Unknown Has patient had a PCN reaction that required hospitalization: No Has patient had a PCN reaction occurring within the last 10 years: No If all of the above answers are "NO", then may proceed with Cephalosporin use.     Current Outpatient Prescriptions  Medication Sig Dispense Refill  . acetaminophen (TYLENOL) 500 MG tablet Take 1,000 mg by mouth every 6 (six) hours as needed for mild pain or headache.    . albuterol (PROVENTIL HFA;VENTOLIN HFA) 108 (90 Base) MCG/ACT inhaler Inhale 2 puffs into the lungs every 4 (four) hours as needed for wheezing or shortness of breath. 1 Inhaler 11  . ALPRAZolam (XANAX) 0.5 MG tablet Take 1 tablet (0.5 mg total) by mouth at bedtime as  needed for anxiety. (Patient taking differently: Take 0.5 mg by mouth as needed for anxiety. ) 30 tablet 5  . anastrozole (ARIMIDEX) 1 MG tablet Take 1 tablet (1 mg total) by mouth daily. 30 tablet 3  . aspirin EC 81 MG tablet Take 1 tablet (81 mg total) by mouth daily. 1 tablet 0  . cyclobenzaprine (FLEXERIL) 5 MG tablet Take 1 tablet (5 mg total) by mouth 3 (three) times daily as needed for muscle spasms. 15 tablet 0  . fexofenadine (ALLEGRA) 180 MG tablet Take 180 mg by mouth 3 (three) times a week. PRN    . fluticasone (FLONASE) 50 MCG/ACT nasal spray Place 2 sprays into both nostrils daily. (Patient taking differently: Place 2 sprays into both nostrils 2 (two) times daily as needed for allergies. ) 16 g 5  . Multiple Vitamin (MULTIVITAMIN) capsule Take 1 capsule by mouth daily.    . pantoprazole (PROTONIX) 40 MG tablet Take 1 tablet (40 mg total) by mouth 2 (two) times daily before a meal. 180 tablet 1  . polyethylene glycol (MIRALAX / GLYCOLAX) packet Take 17 g by mouth daily as needed for mild constipation.    . vitamin B-12 (CYANOCOBALAMIN) 1000 MCG tablet Take 1,000 mcg by mouth daily.     No current facility-administered medications for this visit.     Review of Systems Review of Systems  Constitutional: Negative.   Respiratory: Negative.   Cardiovascular: Negative.     Blood pressure 132/78, pulse 76, resp. rate 10, height 5\' 3"  (1.6 m), weight 165 lb (74.8 kg).  Physical Exam Physical Exam  Constitutional: She is oriented to person, place, and time. She appears well-developed and well-nourished.  HENT:  Mouth/Throat: Oropharynx is clear and moist.  Eyes: Conjunctivae are normal. No scleral icterus.  Neck: Neck supple.  Cardiovascular: Normal rate, regular rhythm and normal heart sounds.   Pulmonary/Chest: Effort normal and breath sounds normal.  bilateral chest wall incisions clean. Fluid aspiration 50 ml right and 90 ml left.  Musculoskeletal:  Upper arm range of motion  has improved-140  Lymphadenopathy:    She has no cervical adenopathy.  Neurological: She is alert and oriented to person, place, and time.  Skin: Skin is warm and dry.  Psychiatric: Her behavior is normal.    Data Reviewed Medical oncology note reviewed and personal discussion with Dr. Tasia Catchings.  Assessment    Steady progress status post left modified radical mastectomy, right simple mastectomy with sentinel node biopsy.    Plan    Discussed recommendations for medical oncology. Patient is looking to radiation, which I think is less important and chemotherapy based on her multiple  node-positive status area  Anticipated field of radiation to the level III nodes above the clavicle reviewed.  Importance of continuing stretching exercises to get full range of motion before radiation starch was discussed.  We'll arrange for repeat CA 27-29 testing next week. If this remains significantly high this may help encourage the patient to reconsider the decision not to proceed with chemotherapy.    Follow up in one week. RX for mastectomy prothesis and supplies.  HPI, Physical Exam, Assessment and Plan have been scribed under the direction and in the presence of Robert Bellow, MD. Karie Fetch, RN  I have completed the exam and reviewed the above documentation for accuracy and completeness.  I agree with the above.  Haematologist has been used and any errors in dictation or transcription are unintentional.  Hervey Ard, M.D., F.A.C.S.  Robert Bellow 10/25/2016, 7:32 AM

## 2016-10-24 NOTE — Patient Instructions (Addendum)
Patient to return in one week. 

## 2016-10-25 ENCOUNTER — Other Ambulatory Visit: Payer: Self-pay

## 2016-10-25 ENCOUNTER — Other Ambulatory Visit: Payer: Self-pay | Admitting: *Deleted

## 2016-10-25 DIAGNOSIS — Z170 Estrogen receptor positive status [ER+]: Principal | ICD-10-CM

## 2016-10-25 DIAGNOSIS — C50512 Malignant neoplasm of lower-outer quadrant of left female breast: Secondary | ICD-10-CM

## 2016-10-25 NOTE — Patient Outreach (Signed)
Norma Johns [facility]) Care Management  10/25/2016  LINDEN MIKES [birth year] [id number]  Telephone Screen  Referral Date: 10/25/16 Referral Source: EMMI referral Referral Reason: Cancer Insurance: [facility] Medicare  Outreach attempt # 1, spoke with patient. HIPAA verified with patient.  Social:  Patient lives with her husband. She needs assistance with her ADLs. Patient's daughter provides transportation to her medical appointments. Patient uses a RW to assist with mobility.  Conditions: Past Medical Hx: Bilateral Mastectomy, Anemia, Arthritis, Asthma, HLD, HTN, Sleep Apnea, Depression, DM, GERD, CVA  Patient had bilateral mastectomy on 10/01/16. She described symptoms of being "weak and sore" since having the procedure. She needs assistance with her ADLs. Patient's husband and daughter are stable, with limited ability to assist. The husband was recently discharged from hospice. The patient's daughter had brain surgery, recently, per patient. The patient stated, they all attempt to assist each other. The patient and her husband doesn't have any home health services.  Patient was tearful during our phone call. She discussed how she was very independent. She always cared for other people, which she is not capable of doing, currently. She voiced having symptoms of depression. "Her brother has been diagnosed with terminal cancer". Patient stated, her Oncologist suggested prosthesis for her breast. Patient talked about calling her PCP's office regarding a SW referral. RN CM [place]) educated patient regarding [facility] services. RN CM [place]) informed patient about making a referral to [facility] and patient agreed.   Medications: Patient takes 7 medications per day. She can afford her medications. Patient takes her medications as prescribed.  Appointments: Patient last PCP appointment was 10/04/16.   Advanced Directives: Pt reported not having an Advanced Directive. She declined to receive any  information regarding Advanced Directives. Patient stated, she was given information from her PCP's office.   Consent: [facility] services reviewed and discussed with patient. Verbal consent received.  Plan: RN CM will send referral to [facility] RN for further in home eval/assessment of care needs and management of chronic conditions. RN CM will send [facility] SW referral for possible assistance with community resources related to transportation. RN CM advised patient to contact RNCM for any needs or concerns. RN CM provided patient with [facility] 24hr Nurse Line contact info.  Lake Bells, RN, BSN, MHA/MSL, [place] Telephonic Care Manager Coordinator [facility] Direct Phone: [phone] Toll Free: [phone] Fax: [phone]

## 2016-10-30 ENCOUNTER — Ambulatory Visit
Admission: RE | Admit: 2016-10-30 | Discharge: 2016-10-30 | Disposition: A | Discharge status: Home / Self Care | Payer: Medicare Other | Source: Ambulatory Visit | Attending: Oncology | Admitting: Oncology

## 2016-10-30 ENCOUNTER — Encounter
Admission: RE | Admit: 2016-10-30 | Discharge: 2016-10-30 | Disposition: A | Discharge status: Home / Self Care | Payer: Medicare Other | Source: Ambulatory Visit | Attending: Oncology | Admitting: Oncology

## 2016-10-30 ENCOUNTER — Other Ambulatory Visit: Payer: Self-pay | Admitting: Licensed Clinical Social Worker

## 2016-10-30 ENCOUNTER — Encounter: Payer: Self-pay | Admitting: Emergency Medicine

## 2016-10-30 DIAGNOSIS — R918 Other nonspecific abnormal finding of lung field: Secondary | ICD-10-CM | POA: Insufficient documentation

## 2016-10-30 DIAGNOSIS — R21 Rash and other nonspecific skin eruption: Secondary | ICD-10-CM

## 2016-10-30 DIAGNOSIS — M545 Low back pain: Secondary | ICD-10-CM | POA: Diagnosis not present

## 2016-10-30 DIAGNOSIS — L299 Pruritus, unspecified: Secondary | ICD-10-CM

## 2016-10-30 DIAGNOSIS — Z9013 Acquired absence of bilateral breasts and nipples: Secondary | ICD-10-CM | POA: Diagnosis not present

## 2016-10-30 DIAGNOSIS — Z5321 Procedure and treatment not carried out due to patient leaving prior to being seen by health care provider: Secondary | ICD-10-CM

## 2016-10-30 DIAGNOSIS — K5730 Diverticulosis of large intestine without perforation or abscess without bleeding: Secondary | ICD-10-CM | POA: Diagnosis not present

## 2016-10-30 DIAGNOSIS — C50512 Malignant neoplasm of lower-outer quadrant of left female breast: Secondary | ICD-10-CM | POA: Insufficient documentation

## 2016-10-30 DIAGNOSIS — Z170 Estrogen receptor positive status [ER+]: Secondary | ICD-10-CM | POA: Diagnosis not present

## 2016-10-30 DIAGNOSIS — G8929 Other chronic pain: Secondary | ICD-10-CM

## 2016-10-30 DIAGNOSIS — N200 Calculus of kidney: Secondary | ICD-10-CM | POA: Insufficient documentation

## 2016-10-30 DIAGNOSIS — C50919 Malignant neoplasm of unspecified site of unspecified female breast: Secondary | ICD-10-CM | POA: Diagnosis not present

## 2016-10-30 HISTORY — DX: Malignant (primary) neoplasm, unspecified: C80.1

## 2016-10-30 MED ORDER — DIPHENHYDRAMINE HCL 25 MG PO CAPS
50.0000 mg | ORAL_CAPSULE | Freq: Once | ORAL | Status: AC
  Administered 2016-10-30: 50 mg via ORAL

## 2016-10-30 MED ORDER — TECHNETIUM TC 99M MEDRONATE IV KIT
25.0000 | PACK | Freq: Once | INTRAVENOUS | Status: AC | PRN
  Administered 2016-10-30: 23.58 via INTRAVENOUS

## 2016-10-30 MED ORDER — DIPHENHYDRAMINE HCL 25 MG PO CAPS
ORAL_CAPSULE | ORAL | Status: AC
  Filled 2016-10-30: qty 2

## 2016-10-30 MED ORDER — IOPAMIDOL (ISOVUE-300) INJECTION 61%
100.0000 mL | Freq: Once | INTRAVENOUS | Status: AC | PRN
  Administered 2016-10-30: 100 mL via INTRAVENOUS

## 2016-10-30 NOTE — Patient Outreach (Addendum)
[facility]) Care Management  10/30/2016  LAVERNE HURSEY 1940-06-26 [id number]  Assessment- CSW completed initial outreach call to patient after receiving new referral for community resource assistance. CSW was able to reach patient successfully and she provided HIPPA verifications. CSW introduced self, reason for call and of [place]. Patient is accepting of [facility] social work services.  Patient has breast cancer and recently had a bilateral mastectomy. Patient has a history of anemia, arthritis, asthma, HLD, HTN, sleep apnea, depression, DM, GERD and CVA. Patient reports that she has stable transportation through her daughter and grandson. She denies needing any other transportation assistance at this time. She reports that most of her family members are also dealing with their own health problems but they lean on each other for assistance. She reports that her husband was recently discharged from [facility] and is doing a lot better. She shares that her brother has been diagnosed with terminal cancer. She shares that her daughter has several health conditions as well. CSW provided emotional support on phone. Patient reports that she does not want mental health assistance at this time and does not need mental health resources because she already as a copy of these in her home that another provider gave her. She shares that her main social work need is financial issues with affording her medical expenses. She reports "I'm making it. You know I am doing pretty good. I have my days like most people but I have a big support system of family and friends." She reports that her main social work need is affording two silicone prothesis and mastectomy bras. She reports that her confidence has decreased since having her bilateral mastectomy and she does not feel comfortable as she once did in her own body. She reports that she has a limited income and will not be able to afford both  of the two silicone prothesis and mastectomy bras. Patient only receives $420 per month and often depends on her spouse's income for her needs. Patient has applied for Medicaid several times in the past but was denied each time due to their asset amount. CSW completed [facility] financial assessment form over the phone and approved patient for the [place] reviewed financial resources within her area. CSW questioned if she had tried to gain food assistance and she reports that she had applied for food stamps in the past and was approved but that they were only willing to give her $10.00 and she declined the food stamps. CSW asked for patient to go to the medical supply store "[employer]" and to have them provide her with an exact amount for the medical equipment she needs. Patient reports that she will do so today and that she gives permission to CSW to talk to [facility] about her needs.  CSW received an incoming call from [facility] from [employer]. She reports that patient came by today and explained that patient will be gaining financial assistance through [facility] She shares that patient's two silicone prothesis will be $140.00 after Medicare and that 6 mastectomy bras will be $50.40 bringing it to a total of $190.40. CSW informed Karena Addison that she will be coming by store this week or the next to provide gift card to go towards these items and that this gift card is only to be used there.  CSW completed call to patient and she answered. CSW provided update. CSW questioned if [place] [facility] were able  to pay $100.00 towards the two silicone prothesis and 6 mastectomy bras would she think she would be able to cover the remaining amount and she stated with time "yes." Patient reports that she would contact her family members and friends to gain financial assistance for this as well or that she can only get 2 mastectomy bras for the time being  until she can get the rest of the money. Patient is agreeable to meet CSW at [facility] on 11/02/16 to provide $100.00 gift card paid for by [place]. Patient is extremely appreciative for this financial assistance and denies any other social work needs and is agreeable to meet CSW at [employer] on 11/01/16.  THN CM Care Plan Problem One     Most Recent Value  Care Plan for Problem One  Active  THN CM Short Term Goal #1   Patient will receive $100.00 giftcard to go towards the needed medical suplies: two silicone prothesis and 6 mastetomy bras within 30 days  THN CM Short Term Goal #1 Start Date  10/30/16  Interventions for Short Term Goal #1  CSW completed FAF assessment and has talked to medical supply store. Family are willing to pay the remaining amount which is $90.40 for the needed medical supplies. CSW will meet patient at medical supply store and provide gift card.     Plan-CSW will gain $100.00 gift card from [place] [employer] for needed medical supplies. CSW will meet patient at medical supply store within one week to provide gift card.  Eula Fried, BSW, MSW, [email or web address] Phone: [phone] Fax: [phone]

## 2016-10-30 NOTE — ED Triage Notes (Signed)
Pt arrived to the ED for complaints of having a generalized rash and itching. Pt states that she was seen in the hospital this morning for a CT and a bone scan, receiving IV contrast. Pt reports that when she got home she began to experience rash and itching and she believes that she is having an allergic reaction. Pt is AOx4 in no apparent distress.

## 2016-10-31 ENCOUNTER — Other Ambulatory Visit: Payer: Self-pay | Admitting: *Deleted

## 2016-10-31 ENCOUNTER — Other Ambulatory Visit
Admission: RE | Admit: 2016-10-31 | Discharge: 2016-10-31 | Disposition: A | Discharge status: Home / Self Care | Payer: Medicare Other | Source: Ambulatory Visit | Attending: General Surgery | Admitting: General Surgery

## 2016-10-31 ENCOUNTER — Emergency Department
Admission: EM | Admit: 2016-10-31 | Discharge: 2016-10-31 | Disposition: A | Discharge status: Home / Self Care | Payer: Medicare Other | Source: Home / Self Care

## 2016-10-31 ENCOUNTER — Ambulatory Visit (INDEPENDENT_AMBULATORY_CARE_PROVIDER_SITE_OTHER): Payer: Medicare Other | Admitting: General Surgery

## 2016-10-31 ENCOUNTER — Other Ambulatory Visit: Payer: Self-pay | Admitting: Licensed Clinical Social Worker

## 2016-10-31 ENCOUNTER — Encounter: Payer: Self-pay | Admitting: General Surgery

## 2016-10-31 VITALS — BP 134/72 | HR 74 | Resp 12 | Ht 63.0 in | Wt 165.0 lb

## 2016-10-31 DIAGNOSIS — C50912 Malignant neoplasm of unspecified site of left female breast: Secondary | ICD-10-CM | POA: Insufficient documentation

## 2016-10-31 DIAGNOSIS — C50512 Malignant neoplasm of lower-outer quadrant of left female breast: Secondary | ICD-10-CM

## 2016-10-31 DIAGNOSIS — Z170 Estrogen receptor positive status [ER+]: Secondary | ICD-10-CM

## 2016-10-31 NOTE — Patient Instructions (Signed)
Return in one week. The patient is aware to call back for any questions or concerns. 

## 2016-10-31 NOTE — Patient Outreach (Signed)
[facility]) Care Management  10/31/2016  Norma Johns [birth year] [id number]  Assessment- CSW received notification that patient is currently in ED. Patient is experiencing itching and had hives around her chest wall. CSW will await for hospital discharge and contact patient to see if she is able to complete visit this week.  Plan-CSW will continue to provide social work assistance and support.  Eula Fried, BSW, MSW, [email or web address] Phone: [phone] Fax: [phone]

## 2016-10-31 NOTE — ED Notes (Signed)
No answer when called several times from lobby 

## 2016-10-31 NOTE — Progress Notes (Signed)
Patient ID: Norma Johns, female   DOB: 03-02-1940, 76 y.o.   MRN: [id number]  Chief Complaint  Patient presents with  . Follow-up    HPI Norma Johns is a 76 y.o. female Here today for er follow up bilateral mastectomy done on 10/01/2016. Patient was seen in the ER last night. She states was itching and saw hives around her chest wall. No fever or chills. This morning she woke up and no more hives, but still some itching going on.  HPI  Past Medical History:  Diagnosis Date  . Anemia   . Anxiety   . Arthritis   . Asthma    WELL CONTROLLED  . Cancer ([place])    Bilat Mastectomy  . Depression   . GERD (gastroesophageal reflux disease)   . Headache    H/O  . Hyperlipidemia   . Sleep apnea    DOES NOT USE CPAP-COULD NOT TOLERATE  . Stroke [facility]) 2004    Past Surgical History:  Procedure Laterality Date  . ABDOMINAL HYSTERECTOMY    . BLADDER REPAIR    . BREAST BIOPSY Right 03/23/1991   Fibroadenoma with intramammary lymph node. Right breast, 9:00.  Marland Kitchen CHOLECYSTECTOMY    . COLONOSCOPY  2005  . MASTECTOMY MODIFIED RADICAL Left 10/01/2016   Procedure: MASTECTOMY MODIFIED RADICAL;  Surgeon: Robert Bellow, MD;  Location: [facility] ORS;  Service: General;  Laterality: Left;  . MR MRA CAROTID  02/2010   Minimal plaque formation; no significant stenosis. Sx: Syncope  . [facility]  . SENTINEL NODE BIOPSY Right 10/01/2016   Procedure: SENTINEL NODE BIOPSY;  Surgeon: Robert Bellow, MD;  Location: [facility] ORS;  Service: General;  Laterality: Right;  . SIMPLE MASTECTOMY WITH AXILLARY SENTINEL NODE BIOPSY Right 10/01/2016   Procedure: SIMPLE MASTECTOMY;  Surgeon: Robert Bellow, MD;  Location: [facility] ORS;  Service: General;  Laterality: Right;    Family History  Problem Relation Age of Onset  . Cancer Father   . Prostate cancer Father   . Heart attack Mother   . Hypertension Mother   . CAD Mother   . Hypertension Son   . Diabetes Son   . Lung cancer  Brother   . Leukemia Brother   . Lung cancer Brother   . Prostate cancer Brother   . Breast cancer Neg Hx     Social History Social History  Substance Use Topics  . Smoking status: Never Smoker  . Smokeless tobacco: Never Used  . Alcohol use No    Allergies  Allergen Reactions  . Chocolate Shortness Of Breath  . Other Shortness Of Breath and Swelling    PT STATES ALLERGY TO "SOME TYPE OF DYE THAT WAS USED AT [facility]."  SWELLING TO ARM AND SOB.  PT CANNOT REMEMBER WHAT THE DYE WAS USED FOR.  . Sulfa Antibiotics Other (See Comments)    unknown  . Atorvastatin Other (See Comments)    Leg pain   . Minocycline Hcl Itching  . Naproxen Other (See Comments)    Indigestion  . Niacin Other (See Comments)    Felt like she was on fire  . Septra [Sulfamethoxazole-Trimethoprim] Other (See Comments)    unknown  . Vioxx [Rofecoxib] Other (See Comments)    unknown  . Penicillins Itching and Rash    Has patient had a PCN reaction causing immediate rash, facial/tongue/throat swelling, SOB or lightheadedness with hypotension: Unknown Has patient had a PCN reaction causing  severe rash involving mucus membranes or skin necrosis: Unknown Has patient had a PCN reaction that required hospitalization: No Has patient had a PCN reaction occurring within the last 10 years: No If all of the above answers are "NO", then may proceed with Cephalosporin use.     Current Outpatient Prescriptions  Medication Sig Dispense Refill  . acetaminophen (TYLENOL) 500 MG tablet Take 1,000 mg by mouth every 6 (six) hours as needed for mild pain or headache.    . albuterol (PROVENTIL HFA;VENTOLIN HFA) 108 (90 Base) MCG/ACT inhaler Inhale 2 puffs into the lungs every 4 (four) hours as needed for wheezing or shortness of breath. 1 Inhaler 11  . ALPRAZolam (XANAX) 0.5 MG tablet Take 1 tablet (0.5 mg total) by mouth at bedtime as needed for anxiety. (Patient taking differently: Take 0.5 mg by mouth as needed for  anxiety. ) 30 tablet 5  . anastrozole (ARIMIDEX) 1 MG tablet Take 1 tablet (1 mg total) by mouth daily. 30 tablet 3  . aspirin EC 81 MG tablet Take 1 tablet (81 mg total) by mouth daily. 1 tablet 0  . cyclobenzaprine (FLEXERIL) 5 MG tablet Take 1 tablet (5 mg total) by mouth 3 (three) times daily as needed for muscle spasms. 15 tablet 0  . fexofenadine (ALLEGRA) 180 MG tablet Take 180 mg by mouth 3 (three) times a week. PRN    . fluticasone (FLONASE) 50 MCG/ACT nasal spray Place 2 sprays into both nostrils daily. (Patient taking differently: Place 2 sprays into both nostrils 2 (two) times daily as needed for allergies. ) 16 g 5  . Multiple Vitamin (MULTIVITAMIN) capsule Take 1 capsule by mouth daily.    . pantoprazole (PROTONIX) 40 MG tablet Take 1 tablet (40 mg total) by mouth 2 (two) times daily before a meal. 180 tablet 1  . polyethylene glycol (MIRALAX / GLYCOLAX) packet Take 17 g by mouth daily as needed for mild constipation.    . vitamin B-12 (CYANOCOBALAMIN) 1000 MCG tablet Take 1,000 mcg by mouth daily.     No current facility-administered medications for this visit.     Review of Systems Review of Systems  Constitutional: Negative.   Respiratory: Negative.   Cardiovascular: Negative.     Blood pressure 134/72, pulse 74, resp. rate 12, height 5\' 3"  (1.6 m), weight 165 lb (74.8 kg).  Physical Exam Physical Exam  Constitutional: She is oriented to person, place, and time. She appears well-developed and well-nourished.  Eyes: Conjunctivae are normal. No scleral icterus.  Neurological: She is alert and oriented to person, place, and time.  Skin: Skin is warm and dry.    Drained 52 ml on the right side and 120 ml on left side.  Upper extremity measurements obtained of the location 15 cm above as well as 10 and 20 cm below the olecranon process:  Right: 33, 28, 23 cm.  Left: 35.5, 29, 22 cm.  Will follow.  Data Reviewed Bone scan dated 10/30/2016: Negative.  CT scan of  the chest, abdomen and pelvis dated 10/30/2016: 8 mm left axillary node reported (19/19 nodes positive on recent mastectomy) sees. Renal scarring. Sigmoid diverticulosis. No evidence of metastatic disease. Stable pulmonary nodules from 2013.  Assessment    No evidence of unknown metastatic disease.  Small seromas bilaterally. Improving.    Plan    Repeat CA Y7-29.    Patient to return in one week.. The patient is aware to call back for any questions or concerns.   HPI, Physical  Exam, Assessment and Plan have been scribed under the direction and in the presence of Hervey Ard, MD.  Gaspar Cola, CMA  I have completed the exam and reviewed the above documentation for accuracy and completeness.  I agree with the above.  Haematologist has been used and any errors in dictation or transcription are unintentional.  Hervey Ard, M.D., F.A.C.S. Robert Bellow 11/01/2016, 6:51 AM

## 2016-10-31 NOTE — Patient Outreach (Signed)
Successful telephone encounter to Norma Johns, 76 year old female for follow up on referral received 10/26/16 from [facility] telephonic RN CM for Community CM services/original referral source- EMMI referral.  Spoke with pt, HIPAA identifiers verified, discussed purpose of call- follow up on referral, schedule home visit to which pt agreed.   Plan:  As discussed with pt, plan to follow up next week- initial home visit.    Norma Johns.   [facility] Care Management  [phone]

## 2016-10-31 NOTE — Patient Outreach (Signed)
Unsuccessful telephone encounter to Norma Johns, 76 year old female for follow up on referral received 10/26/16 from [facility] telephonic RN CM for Community CM services/original referral source - EMMI referral.   Person answering the phone identified himself as pt's spouse (very [facility]), HIPAA identifiers not provided, requested RN CM to call pt later today.    Plan:  RN CM to follow up with pt later today.   [name].   [facility]  [phone]

## 2016-11-01 ENCOUNTER — Ambulatory Visit: Payer: Medicare Other

## 2016-11-01 ENCOUNTER — Other Ambulatory Visit: Payer: Self-pay | Admitting: Licensed Clinical Social Worker

## 2016-11-01 LAB — CANCER ANTIGEN 27.29: CA 27.29: 257.2 U/mL — ABNORMAL HIGH (ref 0.0–38.6)

## 2016-11-01 NOTE — Patient Outreach (Signed)
[facility]) Care Management  [facility] Social Work  11/01/2016  Norma Johns Jul 01, 1940 [id number]  Encounter Medications:  Outpatient Encounter Prescriptions as of 11/01/2016  Medication Sig  . acetaminophen (TYLENOL) 500 MG tablet Take 1,000 mg by mouth every 6 (six) hours as needed for mild pain or headache.  . albuterol (PROVENTIL HFA;VENTOLIN HFA) 108 (90 Base) MCG/ACT inhaler Inhale 2 puffs into the lungs every 4 (four) hours as needed for wheezing or shortness of breath.  . ALPRAZolam (XANAX) 0.5 MG tablet Take 1 tablet (0.5 mg total) by mouth at bedtime as needed for anxiety. (Patient taking differently: Take 0.5 mg by mouth as needed for anxiety. )  . anastrozole (ARIMIDEX) 1 MG tablet Take 1 tablet (1 mg total) by mouth daily.  Marland Kitchen aspirin EC 81 MG tablet Take 1 tablet (81 mg total) by mouth daily.  . cyclobenzaprine (FLEXERIL) 5 MG tablet Take 1 tablet (5 mg total) by mouth 3 (three) times daily as needed for muscle spasms.  . fexofenadine (ALLEGRA) 180 MG tablet Take 180 mg by mouth 3 (three) times a week. PRN  . fluticasone (FLONASE) 50 MCG/ACT nasal spray Place 2 sprays into both nostrils daily. (Patient taking differently: Place 2 sprays into both nostrils 2 (two) times daily as needed for allergies. )  . Multiple Vitamin (MULTIVITAMIN) capsule Take 1 capsule by mouth daily.  . pantoprazole (PROTONIX) 40 MG tablet Take 1 tablet (40 mg total) by mouth 2 (two) times daily before a meal.  . polyethylene glycol (MIRALAX / GLYCOLAX) packet Take 17 g by mouth daily as needed for mild constipation.  . vitamin B-12 (CYANOCOBALAMIN) 1000 MCG tablet Take 1,000 mcg by mouth daily.   No facility-administered encounter medications on file as of 11/01/2016.     Functional Status:  In your present state of health, do you have any difficulty performing the following activities: 10/01/2016 09/24/2016  Hearing? N N  Vision? N N  Difficulty concentrating or making decisions? N N   Walking or climbing stairs? N Y  Comment - KNEE PAIN/ WEAKNESS  Dressing or bathing? N N  Doing errands, shopping? N N  Preparing Food and eating ? - -  Using the Toilet? - -  In the past six months, have you accidently leaked urine? - -  Comment - -  Do you have problems with loss of bowel control? - -  Managing your Medications? - -  Managing your Finances? - -  Housekeeping or managing your Housekeeping? - -  Some recent data might be hidden    Fall/Depression Screening:  PHQ 2/9 Scores 10/30/2016 10/25/2016 06/12/2016 04/05/2016 01/18/2015 09/06/2014  PHQ - 2 Score 1 1 0 0 0 0  PHQ- 9 Score - - 8 - - -    Assessment: CSW met patient and her daughter [name] at [facility] in order to complete visit and provide financial assistance. Patient reports that she is not experiencing any pain at this time but admits to suffering from tenderness and soreness. Patient reports that she has been having difficulty with sleeping at night. Patient states that she takes xanax at night to assist with this. Patient reports that her appetite is "not that great." However, patient reports drinking both boost and protein drinks.   Patient signed consent form. CSW has already completed FAF form and has deemed patient eligible for financial assistance. CSW provided patient with two $50.00 gift cards to equal $100 to go towards her mastectomy equipment. Patient  reports that she has already received one bra when she went to medical supply store this week and is already feeling more comfortable. Patient appreciative of financial assistance and will gain her supplies once she has finished healing.  Patient became emotional during visit stating that she is having difficulty accepting her own health conditions because she is so use to being the one that "takes care of everyone." She shares that she does not want to be a burden to her family and daughter assured her that she is no burden and that she  is happy to help her mother with any and all needs. CSW provided emotional support during visit that patient was very receptive to. Patient denied mental health assistance at this time but shares that she if she wishes to gain mental health assistance then she will discuss the need with her PCP first. Patient reports that she has a list of mental health resources at her residence. She denies needing another copy of resources. Patient reports that she will lean on her support system during this time which consist of family members, friends and church members. Patient reports that she goes to church weekly as well. Patient denies any further social work needs at this time but stated that she may want to contact CSW in the future "just to talk to somebody." CSW will complete social work discharge at this time.  [facility] CM Care Plan Problem One     Most Recent Value  Care Plan for Problem One  Active  [facility] CM Short Term Goal #1   Patient will receive $100.00 giftcard to go towards the needed medical suplies: two silicone prothesis and 6 mastetomy bras within 30 days  [facility] CM Short Term Goal #1 Start Date  10/30/16  [facility] CM Short Term Goal #1 Met Date  11/01/16  Interventions for Short Term Goal #1  Goal met.     Plan-CSW will update [facility] RNCM and PCP of social work discharge.  Eula Fried, BSW, MSW, [email or web address] Phone: [phone] Fax: [phone]    Plan:

## 2016-11-06 ENCOUNTER — Inpatient Hospital Stay: Payer: Medicare Other | Attending: Oncology | Admitting: Oncology

## 2016-11-06 ENCOUNTER — Ambulatory Visit (INDEPENDENT_AMBULATORY_CARE_PROVIDER_SITE_OTHER): Payer: Medicare Other | Admitting: General Surgery

## 2016-11-06 ENCOUNTER — Encounter: Payer: Self-pay | Admitting: Oncology

## 2016-11-06 VITALS — BP 128/76 | HR 68 | Temp 97.8°F | Wt 167.4 lb

## 2016-11-06 VITALS — BP 126/76 | HR 70 | Resp 12 | Ht 68.0 in | Wt 167.0 lb

## 2016-11-06 DIAGNOSIS — D649 Anemia, unspecified: Secondary | ICD-10-CM | POA: Diagnosis not present

## 2016-11-06 DIAGNOSIS — M542 Cervicalgia: Secondary | ICD-10-CM | POA: Diagnosis not present

## 2016-11-06 DIAGNOSIS — Z79899 Other long term (current) drug therapy: Secondary | ICD-10-CM

## 2016-11-06 DIAGNOSIS — Z8673 Personal history of transient ischemic attack (TIA), and cerebral infarction without residual deficits: Secondary | ICD-10-CM | POA: Diagnosis not present

## 2016-11-06 DIAGNOSIS — F329 Major depressive disorder, single episode, unspecified: Secondary | ICD-10-CM

## 2016-11-06 DIAGNOSIS — D0502 Lobular carcinoma in situ of left breast: Secondary | ICD-10-CM

## 2016-11-06 DIAGNOSIS — F419 Anxiety disorder, unspecified: Secondary | ICD-10-CM | POA: Insufficient documentation

## 2016-11-06 DIAGNOSIS — Z9049 Acquired absence of other specified parts of digestive tract: Secondary | ICD-10-CM | POA: Diagnosis not present

## 2016-11-06 DIAGNOSIS — C801 Malignant (primary) neoplasm, unspecified: Secondary | ICD-10-CM

## 2016-11-06 DIAGNOSIS — G8929 Other chronic pain: Secondary | ICD-10-CM | POA: Diagnosis not present

## 2016-11-06 DIAGNOSIS — N6489 Other specified disorders of breast: Secondary | ICD-10-CM

## 2016-11-06 DIAGNOSIS — R51 Headache: Secondary | ICD-10-CM | POA: Diagnosis not present

## 2016-11-06 DIAGNOSIS — C50512 Malignant neoplasm of lower-outer quadrant of left female breast: Secondary | ICD-10-CM

## 2016-11-06 DIAGNOSIS — M549 Dorsalgia, unspecified: Secondary | ICD-10-CM | POA: Diagnosis not present

## 2016-11-06 DIAGNOSIS — Z801 Family history of malignant neoplasm of trachea, bronchus and lung: Secondary | ICD-10-CM | POA: Diagnosis not present

## 2016-11-06 DIAGNOSIS — Z806 Family history of leukemia: Secondary | ICD-10-CM | POA: Diagnosis not present

## 2016-11-06 DIAGNOSIS — Z7982 Long term (current) use of aspirin: Secondary | ICD-10-CM | POA: Diagnosis not present

## 2016-11-06 DIAGNOSIS — J45909 Unspecified asthma, uncomplicated: Secondary | ICD-10-CM | POA: Diagnosis not present

## 2016-11-06 DIAGNOSIS — E785 Hyperlipidemia, unspecified: Secondary | ICD-10-CM | POA: Diagnosis not present

## 2016-11-06 DIAGNOSIS — R918 Other nonspecific abnormal finding of lung field: Secondary | ICD-10-CM | POA: Diagnosis not present

## 2016-11-06 DIAGNOSIS — G4730 Sleep apnea, unspecified: Secondary | ICD-10-CM | POA: Insufficient documentation

## 2016-11-06 DIAGNOSIS — Z170 Estrogen receptor positive status [ER+]: Secondary | ICD-10-CM

## 2016-11-06 DIAGNOSIS — N200 Calculus of kidney: Secondary | ICD-10-CM | POA: Insufficient documentation

## 2016-11-06 DIAGNOSIS — Z9013 Acquired absence of bilateral breasts and nipples: Secondary | ICD-10-CM | POA: Diagnosis not present

## 2016-11-06 DIAGNOSIS — M545 Low back pain, unspecified: Secondary | ICD-10-CM

## 2016-11-06 DIAGNOSIS — K219 Gastro-esophageal reflux disease without esophagitis: Secondary | ICD-10-CM

## 2016-11-06 NOTE — Patient Instructions (Signed)
Return in one week.  

## 2016-11-06 NOTE — Progress Notes (Signed)
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 infusion.  Thank you for this kind referral and the opportunity to participate in the care of this patient. A copy of today's note is routed to referring provider Dr.Bynette.   ZEarlie Server MD, PhD Hematology Oncology [facility]at [facility]amPager- [phone]/12/2016

## 2016-11-06 NOTE — Progress Notes (Signed)
Patient ID: Norma Johns, female   DOB: 1941-01-25, 76 y.o.   MRN: [id number]  Chief Complaint  Patient presents with  . Routine Post Op    HPI Norma Johns is a 76 y.o. female  Here today for er follow up bilateral mastectomy done on 10/01/2016. Patient saw Dr. Tasia Catchings today. Chemotherapy again recommended. HPI  Past Medical History:  Diagnosis Date  . Anemia   . Anxiety   . Arthritis   . Asthma    WELL CONTROLLED  . Cancer ([place])    Bilat Mastectomy  . Depression   . GERD (gastroesophageal reflux disease)   . Headache    H/O  . Hyperlipidemia   . Sleep apnea    DOES NOT USE CPAP-COULD NOT TOLERATE  . Stroke [facility]) 2004    Past Surgical History:  Procedure Laterality Date  . ABDOMINAL HYSTERECTOMY    . BLADDER REPAIR    . BREAST BIOPSY Right 03/23/1991   Fibroadenoma with intramammary lymph node. Right breast, 9:00.  Marland Kitchen CHOLECYSTECTOMY    . COLONOSCOPY  2005  . MASTECTOMY MODIFIED RADICAL Left 10/01/2016   Procedure: MASTECTOMY MODIFIED RADICAL;  Surgeon: Robert Bellow, MD;  Location: [facility] ORS;  Service: General;  Laterality: Left;  . MR MRA CAROTID  02/2010   Minimal plaque formation; no significant stenosis. Sx: Syncope  . [facility]  . SENTINEL NODE BIOPSY Right 10/01/2016   Procedure: SENTINEL NODE BIOPSY;  Surgeon: Robert Bellow, MD;  Location: [facility] ORS;  Service: General;  Laterality: Right;  . SIMPLE MASTECTOMY WITH AXILLARY SENTINEL NODE BIOPSY Right 10/01/2016   Procedure: SIMPLE MASTECTOMY;  Surgeon: Robert Bellow, MD;  Location: [facility] ORS;  Service: General;  Laterality: Right;    Family History  Problem Relation Age of Onset  . Cancer Father   . Prostate cancer Father   . Heart attack Mother   . Hypertension Mother   . CAD Mother   . Hypertension Son   . Diabetes Son   . Lung cancer Brother   . Leukemia Brother   . Lung cancer Brother   . Prostate cancer Brother   . Breast cancer Neg Hx     Social  History Social History  Substance Use Topics  . Smoking status: Never Smoker  . Smokeless tobacco: Never Used  . Alcohol use No    Allergies  Allergen Reactions  . Chocolate Shortness Of Breath  . Other Shortness Of Breath and Swelling    PT STATES ALLERGY TO "SOME TYPE OF DYE THAT WAS USED AT [facility]  SWELLING TO ARM AND SOB.  PT CANNOT REMEMBER WHAT THE DYE WAS USED FOR.  . Sulfa Antibiotics Other (See Comments)    unknown  . Atorvastatin Other (See Comments)    Leg pain   . Minocycline Hcl Itching  . Naproxen Other (See Comments)    Indigestion  . Niacin Other (See Comments)    Felt like she was on fire  . Septra [Sulfamethoxazole-Trimethoprim] Other (See Comments)    unknown  . Vioxx [Rofecoxib] Other (See Comments)    unknown  . Penicillins Itching and Rash    Has patient had a PCN reaction causing immediate rash, facial/tongue/throat swelling, SOB or lightheadedness with hypotension: Unknown Has patient had a PCN reaction causing severe rash involving mucus membranes or skin necrosis: Unknown Has patient had a PCN reaction that required hospitalization: No Has patient had a PCN reaction occurring within the  last 10 years: No If all of the above answers are "NO", then may proceed with Cephalosporin use.     Current Outpatient Prescriptions  Medication Sig Dispense Refill  . acetaminophen (TYLENOL) 500 MG tablet Take 1,000 mg by mouth every 6 (six) hours as needed for mild pain or headache.    . albuterol (PROVENTIL HFA;VENTOLIN HFA) 108 (90 Base) MCG/ACT inhaler Inhale 2 puffs into the lungs every 4 (four) hours as needed for wheezing or shortness of breath. 1 Inhaler 11  . ALPRAZolam (XANAX) 0.5 MG tablet Take 1 tablet (0.5 mg total) by mouth at bedtime as needed for anxiety. (Patient taking differently: Take 0.5 mg by mouth as needed for anxiety. ) 30 tablet 5  . anastrozole (ARIMIDEX) 1 MG tablet Take 1 tablet (1 mg total) by mouth daily. (Patient not  taking: Reported on 11/06/2016) 30 tablet 3  . aspirin EC 81 MG tablet Take 1 tablet (81 mg total) by mouth daily. 1 tablet 0  . cyclobenzaprine (FLEXERIL) 5 MG tablet Take 1 tablet (5 mg total) by mouth 3 (three) times daily as needed for muscle spasms. 15 tablet 0  . fexofenadine (ALLEGRA) 180 MG tablet Take 180 mg by mouth 3 (three) times a week. PRN    . fluticasone (FLONASE) 50 MCG/ACT nasal spray Place 2 sprays into both nostrils daily. (Patient taking differently: Place 2 sprays into both nostrils 2 (two) times daily as needed for allergies. ) 16 g 5  . Multiple Vitamin (MULTIVITAMIN) capsule Take 1 capsule by mouth daily.    . pantoprazole (PROTONIX) 40 MG tablet Take 1 tablet (40 mg total) by mouth 2 (two) times daily before a meal. 180 tablet 1  . polyethylene glycol (MIRALAX / GLYCOLAX) packet Take 17 g by mouth daily as needed for mild constipation.    . vitamin B-12 (CYANOCOBALAMIN) 1000 MCG tablet Take 1,000 mcg by mouth daily.     No current facility-administered medications for this visit.     Review of Systems Review of Systems  Constitutional: Negative.   Respiratory: Negative.   Cardiovascular: Negative.     Blood pressure 126/76, pulse 70, resp. rate 12, height 5\' 8"  (1.727 m), weight 167 lb (75.8 kg).  Physical Exam Physical Exam  Constitutional: She is oriented to person, place, and time. She appears well-developed and well-nourished.  Cardiovascular: Normal rate, regular rhythm and normal heart sounds.   Pulmonary/Chest: Effort normal and breath sounds normal.  Bilateral mastectomy site is clean and healing well.   Neurological: She is alert and oriented to person, place, and time.  Skin: Skin is warm and dry.  Drain 180 ml of fluid  left mastectomy site  Right mastectomy drained  80 ml of fluid  Data Reviewed Medical oncology notes.  Assessment    Seroma postmastectomy. Decreasing volume.  Candidate for adjuvant chemotherapy.    Plan    We reviewed  medical oncology recommendations. I concur the patient is only going to do one additional firing, chemotherapy would be of more benefit than radiation therapy. She is not happy about this.  Reviewed that the seroma collection is unrelated to the malignancy and will resolve.    Patient to return in one week   HPI, Physical Exam, Assessment and Plan have been scribed under the direction and in the presence of Hervey Ard, MD.  Gaspar Cola, CMA  I have completed the exam and reviewed the above documentation for accuracy and completeness.  I agree with the above.  Haematologist  has been used and any errors in dictation or transcription are unintentional.  Hervey Ard, M.D., F.A.C.S.  Robert Bellow 11/06/2016, 12:51 PM

## 2016-11-06 NOTE — Progress Notes (Signed)
Patient here today for follow up.  Patient is not currently taking Arimidex as per Dr Collie Siad instructions.

## 2016-11-07 ENCOUNTER — Ambulatory Visit: Payer: Self-pay | Admitting: *Deleted

## 2016-11-07 ENCOUNTER — Other Ambulatory Visit: Payer: Self-pay | Admitting: *Deleted

## 2016-11-07 ENCOUNTER — Ambulatory Visit
Admission: RE | Admit: 2016-11-07 | Discharge: 2016-11-07 | Disposition: A | Discharge status: Home / Self Care | Payer: Medicare Other | Source: Ambulatory Visit | Attending: Oncology | Admitting: Oncology

## 2016-11-07 DIAGNOSIS — Z170 Estrogen receptor positive status [ER+]: Secondary | ICD-10-CM | POA: Diagnosis present

## 2016-11-07 DIAGNOSIS — M8588 Other specified disorders of bone density and structure, other site: Secondary | ICD-10-CM | POA: Insufficient documentation

## 2016-11-07 DIAGNOSIS — C50512 Malignant neoplasm of lower-outer quadrant of left female breast: Secondary | ICD-10-CM | POA: Diagnosis present

## 2016-11-07 DIAGNOSIS — M810 Age-related osteoporosis without current pathological fracture: Secondary | ICD-10-CM | POA: Diagnosis not present

## 2016-11-07 HISTORY — DX: Malignant neoplasm of unspecified site of unspecified female breast: C50.919

## 2016-11-07 NOTE — Patient Outreach (Signed)
Received a call from pt this am (stated name,dob)- to cancel today's home visit, reports has an appointment,not sure when she will get home.      Plan:  As discussed with pt,initial  home visit rescheduled for 11/19/16.   [name].   [place] [facility]  [phone]

## 2016-11-08 ENCOUNTER — Ambulatory Visit: Payer: Medicare Other

## 2016-11-12 ENCOUNTER — Ambulatory Visit: Payer: Medicare Other

## 2016-11-13 ENCOUNTER — Ambulatory Visit: Payer: Medicare Other

## 2016-11-14 ENCOUNTER — Ambulatory Visit (INDEPENDENT_AMBULATORY_CARE_PROVIDER_SITE_OTHER): Payer: Medicare Other | Admitting: General Surgery

## 2016-11-14 ENCOUNTER — Encounter: Payer: Self-pay | Admitting: General Surgery

## 2016-11-14 ENCOUNTER — Ambulatory Visit: Payer: Medicare Other

## 2016-11-14 VITALS — BP 132/80 | HR 72 | Resp 12 | Ht 63.0 in | Wt 165.0 lb

## 2016-11-14 DIAGNOSIS — C50512 Malignant neoplasm of lower-outer quadrant of left female breast: Secondary | ICD-10-CM

## 2016-11-14 DIAGNOSIS — Z170 Estrogen receptor positive status [ER+]: Secondary | ICD-10-CM

## 2016-11-14 DIAGNOSIS — N6489 Other specified disorders of breast: Secondary | ICD-10-CM

## 2016-11-14 NOTE — Progress Notes (Signed)
Patient ID: LASHAWN BROMWELL, female   DOB: 1940-12-18, 76 y.o.   MRN: [id number]  Chief Complaint  Patient presents with  . Routine Post Op    HPI MARYAGNES CARRASCO is a 76 y.o. female here today for her follow up bilateral; mastectomy. Patient states she is doing well. She thinks there is fluid building up again.  HPI  Past Medical History:  Diagnosis Date  . Anemia   . Anxiety   . Arthritis   . Asthma    WELL CONTROLLED  . Breast cancer ([place]) 2018   left breast cnacer  . Cancer ([place])    Bilat Mastectomy  . Depression   . GERD (gastroesophageal reflux disease)   . Headache    H/O  . Hyperlipidemia   . Sleep apnea    DOES NOT USE CPAP-COULD NOT TOLERATE  . Stroke [facility]) 2004    Past Surgical History:  Procedure Laterality Date  . ABDOMINAL HYSTERECTOMY    . BLADDER REPAIR    . BREAST BIOPSY Right 03/23/1991   Fibroadenoma with intramammary lymph node. Right breast, 9:00.  Marland Kitchen CHOLECYSTECTOMY    . COLONOSCOPY  2005  . MASTECTOMY MODIFIED RADICAL Left 10/01/2016   Procedure: MASTECTOMY MODIFIED RADICAL;  Surgeon: Robert Bellow, MD;  Location: [facility] ORS;  Service: General;  Laterality: Left;  . MR MRA CAROTID  02/2010   Minimal plaque formation; no significant stenosis. Sx: Syncope  . [place] [facility]  . SENTINEL NODE BIOPSY Right 10/01/2016   Procedure: SENTINEL NODE BIOPSY;  Surgeon: Robert Bellow, MD;  Location: [facility] ORS;  Service: General;  Laterality: Right;  . SIMPLE MASTECTOMY WITH AXILLARY SENTINEL NODE BIOPSY Right 10/01/2016   Procedure: SIMPLE MASTECTOMY;  Surgeon: Robert Bellow, MD;  Location: [facility] ORS;  Service: General;  Laterality: Right;    Family History  Problem Relation Age of Onset  . Cancer Father   . Prostate cancer Father   . Heart attack Mother   . Hypertension Mother   . CAD Mother   . Hypertension Son   . Diabetes Son   . Lung cancer Brother   . Leukemia Brother   . Lung cancer Brother   . Prostate cancer  Brother   . Breast cancer Neg Hx     Social History Social History  Substance Use Topics  . Smoking status: Never Smoker  . Smokeless tobacco: Never Used  . Alcohol use No    Allergies  Allergen Reactions  . Chocolate Shortness Of Breath  . Other Shortness Of Breath and Swelling    PT STATES ALLERGY TO "SOME TYPE OF DYE THAT WAS USED AT [facility]."  SWELLING TO ARM AND SOB.  PT CANNOT REMEMBER WHAT THE DYE WAS USED FOR.  . Sulfa Antibiotics Other (See Comments)    unknown  . Atorvastatin Other (See Comments)    Leg pain   . Minocycline Hcl Itching  . Naproxen Other (See Comments)    Indigestion  . Niacin Other (See Comments)    Felt like she was on fire  . Septra [Sulfamethoxazole-Trimethoprim] Other (See Comments)    unknown  . Vioxx [Rofecoxib] Other (See Comments)    unknown  . Penicillins Itching and Rash    Has patient had a PCN reaction causing immediate rash, facial/tongue/throat swelling, SOB or lightheadedness with hypotension: Unknown Has patient had a PCN reaction causing severe rash involving mucus membranes or skin necrosis: Unknown Has patient had a PCN  reaction that required hospitalization: No Has patient had a PCN reaction occurring within the last 10 years: No If all of the above answers are "NO", then may proceed with Cephalosporin use.     Current Outpatient Prescriptions  Medication Sig Dispense Refill  . acetaminophen (TYLENOL) 500 MG tablet Take 1,000 mg by mouth every 6 (six) hours as needed for mild pain or headache.    . albuterol (PROVENTIL HFA;VENTOLIN HFA) 108 (90 Base) MCG/ACT inhaler Inhale 2 puffs into the lungs every 4 (four) hours as needed for wheezing or shortness of breath. 1 Inhaler 11  . ALPRAZolam (XANAX) 0.5 MG tablet Take 1 tablet (0.5 mg total) by mouth at bedtime as needed for anxiety. (Patient taking differently: Take 0.5 mg by mouth as needed for anxiety. ) 30 tablet 5  . anastrozole (ARIMIDEX) 1 MG tablet Take 1 tablet  (1 mg total) by mouth daily. 30 tablet 3  . aspirin EC 81 MG tablet Take 1 tablet (81 mg total) by mouth daily. 1 tablet 0  . cyclobenzaprine (FLEXERIL) 5 MG tablet Take 1 tablet (5 mg total) by mouth 3 (three) times daily as needed for muscle spasms. 15 tablet 0  . fexofenadine (ALLEGRA) 180 MG tablet Take 180 mg by mouth 3 (three) times a week. PRN    . fluticasone (FLONASE) 50 MCG/ACT nasal spray Place 2 sprays into both nostrils daily. (Patient taking differently: Place 2 sprays into both nostrils 2 (two) times daily as needed for allergies. ) 16 g 5  . Multiple Vitamin (MULTIVITAMIN) capsule Take 1 capsule by mouth daily.    . pantoprazole (PROTONIX) 40 MG tablet Take 1 tablet (40 mg total) by mouth 2 (two) times daily before a meal. 180 tablet 1  . polyethylene glycol (MIRALAX / GLYCOLAX) packet Take 17 g by mouth daily as needed for mild constipation.    . vitamin B-12 (CYANOCOBALAMIN) 1000 MCG tablet Take 1,000 mcg by mouth daily.     No current facility-administered medications for this visit.     Review of Systems Review of Systems  Constitutional: Negative.   Respiratory: Negative.   Cardiovascular: Negative.     Blood pressure 132/80, pulse 72, resp. rate 12, height 5\' 3"  (1.6 m), weight 165 lb (74.8 kg).  Physical Exam Physical Exam  Constitutional: She is oriented to person, place, and time. She appears well-developed and well-nourished.  Pulmonary/Chest:  65 ml right seroma. Left seroma 110 ml  Neurological: She is alert and oriented to person, place, and time.  Skin: Skin is warm and dry.  Psychiatric: Her behavior is normal.      Assessment    Aspiration well tolerated after ChloraPrep and local anesthetic.    Plan         Follow up in 1 week  HPI, Physical Exam, Assessment and Plan have been scribed under the direction and in the presence of Robert Bellow, MD. Karie Fetch, RN  I have completed the exam and reviewed the above documentation for  accuracy and completeness.  I agree with the above.  Haematologist has been used and any errors in dictation or transcription are unintentional.  Hervey Ard, M.D., F.A.C.S.  Robert Bellow 11/16/2016, 8:35 PM

## 2016-11-14 NOTE — Patient Instructions (Signed)
The patient is aware to call back for any questions or concerns.  

## 2016-11-15 ENCOUNTER — Ambulatory Visit: Payer: Medicare Other

## 2016-11-15 DIAGNOSIS — Z9013 Acquired absence of bilateral breasts and nipples: Secondary | ICD-10-CM | POA: Diagnosis not present

## 2016-11-15 DIAGNOSIS — C50112 Malignant neoplasm of central portion of left female breast: Secondary | ICD-10-CM | POA: Diagnosis not present

## 2016-11-15 DIAGNOSIS — C50111 Malignant neoplasm of central portion of right female breast: Secondary | ICD-10-CM | POA: Diagnosis not present

## 2016-11-16 ENCOUNTER — Ambulatory Visit: Payer: Medicare Other

## 2016-11-19 ENCOUNTER — Other Ambulatory Visit: Payer: Self-pay | Admitting: Oncology

## 2016-11-19 ENCOUNTER — Other Ambulatory Visit: Payer: Self-pay | Admitting: *Deleted

## 2016-11-19 ENCOUNTER — Encounter: Payer: Self-pay | Admitting: *Deleted

## 2016-11-19 ENCOUNTER — Ambulatory Visit: Payer: Medicare Other

## 2016-11-19 DIAGNOSIS — C50512 Malignant neoplasm of lower-outer quadrant of left female breast: Secondary | ICD-10-CM

## 2016-11-19 DIAGNOSIS — Z170 Estrogen receptor positive status [ER+]: Principal | ICD-10-CM
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
## 2016-11-20 ENCOUNTER — Inpatient Hospital Stay: Payer: Medicare Other

## 2016-11-20 ENCOUNTER — Inpatient Hospital Stay: Payer: Medicare Other | Admitting: Oncology

## 2016-11-20 ENCOUNTER — Ambulatory Visit: Payer: Medicare Other

## 2016-11-21 ENCOUNTER — Ambulatory Visit: Payer: Medicare Other

## 2016-11-22 ENCOUNTER — Ambulatory Visit: Payer: Medicare Other

## 2016-11-22 ENCOUNTER — Ambulatory Visit (INDEPENDENT_AMBULATORY_CARE_PROVIDER_SITE_OTHER): Payer: Medicare Other | Admitting: General Surgery

## 2016-11-22 ENCOUNTER — Encounter: Payer: Self-pay | Admitting: General Surgery

## 2016-11-22 VITALS — BP 130/84 | HR 76 | Resp 14 | Ht 63.0 in | Wt 166.0 lb

## 2016-11-22 DIAGNOSIS — C50512 Malignant neoplasm of lower-outer quadrant of left female breast: Secondary | ICD-10-CM

## 2016-11-22 DIAGNOSIS — Z170 Estrogen receptor positive status [ER+]: Principal | ICD-10-CM

## 2016-11-22 DIAGNOSIS — N6489 Other specified disorders of breast: Secondary | ICD-10-CM

## 2016-11-22 NOTE — Patient Instructions (Signed)
The patient is aware to call back for any questions or concerns.  

## 2016-11-22 NOTE — Progress Notes (Addendum)
Patient ID: Norma Johns, female   DOB: [birth year], 76 y.o.   MRN: [id number]  Chief Complaint  Patient presents with  . Routine Post Op    HPI Norma Johns is a 76 y.o. female.  Here today for her follow up bilateral mastectomy. She admits to chest soreness but no pain. She states her breast prothesis are heavy and she does not wear them often. She states left forearm is sore and tender. Tolerating Arimidex.  HPI  Past Medical History:  Diagnosis Date  . Anemia   . Anxiety   . Arthritis   . Asthma    WELL CONTROLLED  . Breast cancer ([place]) 2018   left breast cnacer  . Cancer ([place])    Bilat Mastectomy  . Depression   . GERD (gastroesophageal reflux disease)   . Headache    H/O  . Hyperlipidemia   . Sleep apnea    DOES NOT USE CPAP-COULD NOT TOLERATE  . Stroke [facility]) 2004    Past Surgical History:  Procedure Laterality Date  . ABDOMINAL HYSTERECTOMY    . BLADDER REPAIR    . BREAST BIOPSY Right 03/23/1991   Fibroadenoma with intramammary lymph node. Right breast, 9:00.  [facility] CHOLECYSTECTOMY    . COLONOSCOPY  2005  . MASTECTOMY MODIFIED RADICAL Left 10/01/2016   Procedure: MASTECTOMY MODIFIED RADICAL;  Surgeon: Robert Bellow, MD;  Location: [facility] ORS;  Service: General;  Laterality: Left;  . MR MRA CAROTID  02/2010   Minimal plaque formation; no significant stenosis. Sx: Syncope  . [place] [facility]  . SENTINEL NODE BIOPSY Right 10/01/2016   Procedure: SENTINEL NODE BIOPSY;  Surgeon: Robert Bellow, MD;  Location: [facility] ORS;  Service: General;  Laterality: Right;  . SIMPLE MASTECTOMY WITH AXILLARY SENTINEL NODE BIOPSY Right 10/01/2016   Procedure: SIMPLE MASTECTOMY;  Surgeon: Robert Bellow, MD;  Location: [facility] ORS;  Service: General;  Laterality: Right;    Family History  Problem Relation Age of Onset  . Cancer Father   . Prostate cancer Father   . Heart attack Mother   . Hypertension Mother   . CAD Mother   . Hypertension Son   .  Diabetes Son   . Lung cancer Brother   . Leukemia Brother   . Lung cancer Brother   . Prostate cancer Brother   . Breast cancer Neg Hx     Social History Social History  Substance Use Topics  . Smoking status: Never Smoker  . Smokeless tobacco: Never Used  . Alcohol use No    Allergies  Allergen Reactions  . Chocolate Shortness Of Breath  . Other Shortness Of Breath and Swelling    PT STATES ALLERGY TO "SOME TYPE OF DYE THAT WAS USED AT [facility]."  SWELLING TO ARM AND SOB.  PT CANNOT REMEMBER WHAT THE DYE WAS USED FOR.  . Sulfa Antibiotics Other (See Comments)    unknown  . Atorvastatin Other (See Comments)    Leg pain   . Minocycline Hcl Itching  . Naproxen Other (See Comments)    Indigestion  . Niacin Other (See Comments)    Felt like she was on fire  . Septra [Sulfamethoxazole-Trimethoprim] Other (See Comments)    unknown  . Vioxx [Rofecoxib] Other (See Comments)    unknown  . Penicillins Itching and Rash    Has patient had a PCN reaction causing immediate rash, facial/tongue/throat swelling, SOB or lightheadedness with hypotension: Unknown Has patient  had a PCN reaction causing severe rash involving mucus membranes or skin necrosis: Unknown Has patient had a PCN reaction that required hospitalization: No Has patient had a PCN reaction occurring within the last 10 years: No If all of the above answers are "NO", then may proceed with Cephalosporin use.     Current Outpatient Prescriptions  Medication Sig Dispense Refill  . acetaminophen (TYLENOL) 500 MG tablet Take 1,000 mg by mouth every 6 (six) hours as needed for mild pain or headache.    . albuterol (PROVENTIL HFA;VENTOLIN HFA) 108 (90 Base) MCG/ACT inhaler Inhale 2 puffs into the lungs every 4 (four) hours as needed for wheezing or shortness of breath. 1 Inhaler 11  . ALPRAZolam (XANAX) 0.5 MG tablet Take 1 tablet (0.5 mg total) by mouth at bedtime as needed for anxiety. (Patient taking differently: Take  0.5 mg by mouth as needed for anxiety. ) 30 tablet 5  . anastrozole (ARIMIDEX) 1 MG tablet Take 1 tablet (1 mg total) by mouth daily. 30 tablet 3  . aspirin EC 81 MG tablet Take 1 tablet (81 mg total) by mouth daily. 1 tablet 0  . cyclobenzaprine (FLEXERIL) 5 MG tablet Take 1 tablet (5 mg total) by mouth 3 (three) times daily as needed for muscle spasms. 15 tablet 0  . fexofenadine (ALLEGRA) 180 MG tablet Take 180 mg by mouth 3 (three) times a week. PRN    . fluticasone (FLONASE) 50 MCG/ACT nasal spray Place 2 sprays into both nostrils daily. (Patient taking differently: Place 2 sprays into both nostrils 2 (two) times daily as needed for allergies. ) 16 g 5  . Multiple Vitamin (MULTIVITAMIN) capsule Take 1 capsule by mouth daily.    . pantoprazole (PROTONIX) 40 MG tablet Take 1 tablet (40 mg total) by mouth 2 (two) times daily before a meal. 180 tablet 1  . polyethylene glycol (MIRALAX / GLYCOLAX) packet Take 17 g by mouth daily as needed for mild constipation.    . vitamin B-12 (CYANOCOBALAMIN) 1000 MCG tablet Take 1,000 mcg by mouth daily.     No current facility-administered medications for this visit.     Review of Systems Review of Systems  Constitutional: Negative.   Respiratory: Negative.   Cardiovascular: Negative.     Blood pressure 130/84, pulse 76, resp. rate 14, height 5\' 3"  (1.6 m), weight 166 lb (75.3 kg).  Physical Exam Physical Exam  Constitutional: She is oriented to person, place, and time. She appears well-developed and well-nourished.  HENT:  Mouth/Throat: Oropharynx is clear and moist.  Eyes: Conjunctivae are normal. No scleral icterus.  Neck: Neck supple.  Cardiovascular: Normal rate, regular rhythm and normal heart sounds.   Pulmonary/Chest: Effort normal and breath sounds normal.    Bilateral mastectomy incisions well healed  Musculoskeletal:       Arms: Excellent upper arm range of motion.  Left upper forearm is sore and tender.  Lymphadenopathy:    She  has no cervical adenopathy.  Neurological: She is alert and oriented to person, place, and time.  Skin: Skin is warm and dry.  Psychiatric: Her behavior is normal.   Upper extremity measurements obtained of the location 15 cm above as well as 10 and 20 cm below the olecranon process.  Right: 32.5, 28, 22 cm.  Left: 35, 29, 22 cm.   Assessment    Gradual resolution of postmastectomy seroma.    Plan        Follow up in one week  HPI, Physical Exam,  Assessment and Plan have been scribed under the direction and in the presence of Robert Bellow, MD. Karie Fetch, RN  I have completed the exam and reviewed the above documentation for accuracy and completeness.  I agree with the above.  Haematologist has been used and any errors in dictation or transcription are unintentional.  Hervey Ard, M.D., F.A.C.S.  Robert Bellow 11/22/2016, 10:20 AM

## 2016-11-23 ENCOUNTER — Ambulatory Visit: Payer: Medicare Other

## 2016-11-26 ENCOUNTER — Ambulatory Visit: Payer: Medicare Other

## 2016-11-27 ENCOUNTER — Encounter: Payer: Self-pay | Admitting: General Surgery

## 2016-11-27 ENCOUNTER — Ambulatory Visit: Payer: Medicare Other

## 2016-11-28 ENCOUNTER — Ambulatory Visit (INDEPENDENT_AMBULATORY_CARE_PROVIDER_SITE_OTHER): Payer: Medicare Other | Admitting: General Surgery

## 2016-11-28 ENCOUNTER — Inpatient Hospital Stay (HOSPITAL_BASED_OUTPATIENT_CLINIC_OR_DEPARTMENT_OTHER): Payer: Medicare Other | Admitting: Oncology

## 2016-11-28 ENCOUNTER — Encounter: Payer: Self-pay | Admitting: Oncology

## 2016-11-28 ENCOUNTER — Encounter: Payer: Self-pay | Admitting: General Surgery

## 2016-11-28 ENCOUNTER — Inpatient Hospital Stay: Payer: Medicare Other | Attending: Oncology

## 2016-11-28 ENCOUNTER — Inpatient Hospital Stay: Payer: Medicare Other

## 2016-11-28 ENCOUNTER — Ambulatory Visit: Payer: Medicare Other

## 2016-11-28 VITALS — BP 146/74 | HR 66 | Resp 14 | Ht 64.0 in | Wt 167.0 lb

## 2016-11-28 VITALS — BP 137/72 | HR 61 | Temp 98.4°F | Resp 16 | Wt 168.1 lb

## 2016-11-28 DIAGNOSIS — Z9013 Acquired absence of bilateral breasts and nipples: Secondary | ICD-10-CM | POA: Diagnosis not present

## 2016-11-28 DIAGNOSIS — J45909 Unspecified asthma, uncomplicated: Secondary | ICD-10-CM | POA: Insufficient documentation

## 2016-11-28 DIAGNOSIS — Z79899 Other long term (current) drug therapy: Secondary | ICD-10-CM

## 2016-11-28 DIAGNOSIS — Z79811 Long term (current) use of aromatase inhibitors: Secondary | ICD-10-CM | POA: Insufficient documentation

## 2016-11-28 DIAGNOSIS — Z9049 Acquired absence of other specified parts of digestive tract: Secondary | ICD-10-CM | POA: Insufficient documentation

## 2016-11-28 DIAGNOSIS — Z8673 Personal history of transient ischemic attack (TIA), and cerebral infarction without residual deficits: Secondary | ICD-10-CM | POA: Diagnosis not present

## 2016-11-28 DIAGNOSIS — Z170 Estrogen receptor positive status [ER+]: Secondary | ICD-10-CM | POA: Diagnosis not present

## 2016-11-28 DIAGNOSIS — G4730 Sleep apnea, unspecified: Secondary | ICD-10-CM | POA: Diagnosis not present

## 2016-11-28 DIAGNOSIS — K5730 Diverticulosis of large intestine without perforation or abscess without bleeding: Secondary | ICD-10-CM | POA: Diagnosis not present

## 2016-11-28 DIAGNOSIS — C50512 Malignant neoplasm of lower-outer quadrant of left female breast: Secondary | ICD-10-CM | POA: Diagnosis not present

## 2016-11-28 DIAGNOSIS — C7951 Secondary malignant neoplasm of bone: Secondary | ICD-10-CM | POA: Diagnosis not present

## 2016-11-28 DIAGNOSIS — F329 Major depressive disorder, single episode, unspecified: Secondary | ICD-10-CM | POA: Diagnosis not present

## 2016-11-28 DIAGNOSIS — D649 Anemia, unspecified: Secondary | ICD-10-CM

## 2016-11-28 DIAGNOSIS — Z801 Family history of malignant neoplasm of trachea, bronchus and lung: Secondary | ICD-10-CM | POA: Insufficient documentation

## 2016-11-28 DIAGNOSIS — N200 Calculus of kidney: Secondary | ICD-10-CM | POA: Insufficient documentation

## 2016-11-28 DIAGNOSIS — E785 Hyperlipidemia, unspecified: Secondary | ICD-10-CM | POA: Insufficient documentation

## 2016-11-28 DIAGNOSIS — D0502 Lobular carcinoma in situ of left breast: Secondary | ICD-10-CM

## 2016-11-28 DIAGNOSIS — F419 Anxiety disorder, unspecified: Secondary | ICD-10-CM | POA: Insufficient documentation

## 2016-11-28 DIAGNOSIS — Z8042 Family history of malignant neoplasm of prostate: Secondary | ICD-10-CM

## 2016-11-28 DIAGNOSIS — R918 Other nonspecific abnormal finding of lung field: Secondary | ICD-10-CM

## 2016-11-28 DIAGNOSIS — K219 Gastro-esophageal reflux disease without esophagitis: Secondary | ICD-10-CM

## 2016-11-28 DIAGNOSIS — M8580 Other specified disorders of bone density and structure, unspecified site: Secondary | ICD-10-CM | POA: Insufficient documentation

## 2016-11-28 DIAGNOSIS — M85852 Other specified disorders of bone density and structure, left thigh: Secondary | ICD-10-CM

## 2016-11-28 DIAGNOSIS — J341 Cyst and mucocele of nose and nasal sinus: Secondary | ICD-10-CM

## 2016-11-28 LAB — BASIC METABOLIC PANEL
Anion gap: 5 (ref 5–15)
BUN: 14 mg/dL (ref 6–20)
CALCIUM: 9.7 mg/dL (ref 8.9–10.3)
CO2: 26 mmol/L (ref 22–32)
CREATININE: 0.65 mg/dL (ref 0.44–1.00)
Chloride: 105 mmol/L (ref 101–111)
GFR calc non Af Amer: >60 mL/min (ref >60)
Glucose, Bld: 183 mg/dL — ABNORMAL HIGH (ref 65–99)
Potassium: 4.1 mmol/L (ref 3.5–5.1)
Sodium: 136 mmol/L (ref 135–145)

## 2016-11-28 NOTE — Progress Notes (Signed)
Hematology/Oncology Follow Up Note [facility] [phone] [phone]   Patient Care Team: Rubye Beach as PCP - General (Family Medicine) Bary Castilla Forest Gleason, MD (General Surgery) Rubye Beach as Physician Assistant (Family Medicine) Lyman Speller, RN as [place] Management  REASON FOR VISIT Follow up for treatment of breast cancer.  HISTORY OF PRESENTING ILLNESS/PERTINENT ONCOLOGY HISTORY Norma Johns is a 76 y.o.afemale who has above oncology history reviewed by me today presented for follow up visit for management of   evaluation and management of breast cancer.   Patient went for routine mammogram screening in May. Abnormalities was found on the left breast. Patient was called back to have diagnostic mammogram done on 08/08/2016 which showed left breast mass at 4:00 3 cm from nipple and a 4:00 6 cm of the nipple. Indeterminate left axillary lymph nodes.  Patient underwent biopsy on 08/15/2016. Pathology showed invasive lobular carcinoma for both breast lesion, . DCIS present, lymph node biopsy is positive for metastatic lobular carcinoma  MRI of the breast on 09/10/2016 showed the having small no mass area of progressive enhancement, left breast 2 biopsy proven malignancy seen in the left breast were separated by 3.4 cm band of weakly enhancing tissue. An area of stippled discontinuous no masses enhancement extends superiorly in the left breast in 2 the upper outer quadrant spanning approximately 3.2 cm anterior/superior to the more anterior of the 2 biopsy proven malignancy. Recommend MRI guided core biopsy of the treated area of non-mass enhancement. Patient was seen and evaluated by Dr. Tollie Pizza and position was left modified radical mastectomy, right simple mastectomy with sentinel node biopsy. Patient underwent surgery on 10/01/2016. SHe tolerated the procedure well.  She declined  systemic chemotherapy or radiation therapy. She was started on Arimidex '1mg'$  daily since September. Denies any hotflush or joint pain.  Her wound has healed well and she follows up with surgeon for seroma. She does not complaints about pain. She noticed a small area of fluid collection. Denies fever or chills, shortness of breath, chest pain, abdominal pain.   ROS:  Review of Systems  Constitutional: Negative.   HENT:  Negative.   Eyes: Negative.   Respiratory: Negative.   Cardiovascular: Negative.   Gastrointestinal: Negative.   Endocrine: Negative.   Genitourinary: Negative.    Musculoskeletal:       Chronic back pain  Skin: Negative.   Neurological: Negative.   Hematological: Negative.   Psychiatric/Behavioral: Negative.     MEDICAL HISTORY:  Past Medical History:  Diagnosis Date  . Anemia   . Anxiety   . Arthritis   . Asthma    WELL CONTROLLED  . Breast cancer ([place]) 2018   left breast cnacer  . Cancer ([place])    Bilat Mastectomy  . Depression   . GERD (gastroesophageal reflux disease)   . Headache    H/O  . Hyperlipidemia   . Sleep apnea    DOES NOT USE CPAP-COULD NOT TOLERATE  . Stroke [facility]) 2004    SURGICAL HISTORY: Past Surgical History:  Procedure Laterality Date  . ABDOMINAL HYSTERECTOMY    . BLADDER REPAIR    . BREAST BIOPSY Right 03/23/1991   Fibroadenoma with intramammary lymph node. Right breast, 9:00.  Marland Kitchen CHOLECYSTECTOMY    . COLONOSCOPY  2005  . MASTECTOMY MODIFIED RADICAL Left 10/01/2016   Procedure: MASTECTOMY MODIFIED RADICAL;  Surgeon: Robert Bellow, MD;  Location: [facility] ORS;  Service: General;  Laterality: Left;  .  MR MRA CAROTID  02/2010   Minimal plaque formation; no significant stenosis. Sx: Syncope  . [place] [facility]  . SENTINEL NODE BIOPSY Right 10/01/2016   Procedure: SENTINEL NODE BIOPSY;  Surgeon: Robert Bellow, MD;  Location: [facility] ORS;  Service: General;  Laterality: Right;  . SIMPLE MASTECTOMY WITH  AXILLARY SENTINEL NODE BIOPSY Right 10/01/2016   Procedure: SIMPLE MASTECTOMY;  Surgeon: Robert Bellow, MD;  Location: [facility] ORS;  Service: General;  Laterality: Right;    SOCIAL HISTORY: Social History   Social History  . Marital status: Married    Spouse name: N/A  . Number of children: 2  . Years of education: N/A   Occupational History  . Not on file.   Social History Main Topics  . Smoking status: Never Smoker  . Smokeless tobacco: Never Used  . Alcohol use No  . Drug use: No  . Sexual activity: No   Other Topics Concern  . Not on file   Social History Narrative  . No narrative on file    FAMILY HISTORY: Family History  Problem Relation Age of Onset  . Cancer Father   . Prostate cancer Father   . Heart attack Mother   . Hypertension Mother   . CAD Mother   . Hypertension Son   . Diabetes Son   . Lung cancer Brother   . Leukemia Brother   . Lung cancer Brother   . Prostate cancer Brother   . Breast cancer Neg Hx     ALLERGIES:  is allergic to chocolate; other; sulfa antibiotics; atorvastatin; minocycline hcl; naproxen; niacin; septra [sulfamethoxazole-trimethoprim]; vioxx [rofecoxib]; and penicillins.  MEDICATIONS:  Current Outpatient Prescriptions  Medication Sig Dispense Refill  . acetaminophen (TYLENOL) 500 MG tablet Take 1,000 mg by mouth every 6 (six) hours as needed for mild pain or headache.    . albuterol (PROVENTIL HFA;VENTOLIN HFA) 108 (90 Base) MCG/ACT inhaler Inhale 2 puffs into the lungs every 4 (four) hours as needed for wheezing or shortness of breath. 1 Inhaler 11  . ALPRAZolam (XANAX) 0.5 MG tablet Take 1 tablet (0.5 mg total) by mouth at bedtime as needed for anxiety. (Patient taking differently: Take 0.5 mg by mouth as needed for anxiety. ) 30 tablet 5  . anastrozole (ARIMIDEX) 1 MG tablet Take 1 tablet (1 mg total) by mouth daily. 30 tablet 3  . fexofenadine (ALLEGRA) 180 MG tablet Take 180 mg by mouth 3 (three) times a week. PRN    .  fluticasone (FLONASE) 50 MCG/ACT nasal spray Place 2 sprays into both nostrils daily. (Patient taking differently: Place 2 sprays into both nostrils 2 (two) times daily as needed for allergies. ) 16 g 5  . Multiple Vitamin (MULTIVITAMIN) capsule Take 1 capsule by mouth daily.    . pantoprazole (PROTONIX) 40 MG tablet Take 1 tablet (40 mg total) by mouth 2 (two) times daily before a meal. 180 tablet 1  . polyethylene glycol (MIRALAX / GLYCOLAX) packet Take 17 g by mouth daily as needed for mild constipation.    . vitamin B-12 (CYANOCOBALAMIN) 1000 MCG tablet Take 1,000 mcg by mouth daily.     No current facility-administered medications for this visit.       Marland Kitchen  PHYSICAL EXAMINATION: ECOG PERFORMANCE STATUS: 0 - Asymptomatic Vitals:   11/28/16 1150  BP: 137/72  Pulse: 61  Resp: 16  Temp: 98.4 F (36.9 C)   Filed Weights   11/28/16 1150  Weight:  168 lb 1 oz (76.2 kg)    GENERAL:  Alert, no distress and comfortable.  EYES: no pallor or icterus OROPHARYNX: no thrush or ulceration; good dentition  NECK: supple, no masses felt LYMPH:  no palpable lymphadenopathy in the cervical, axillary or inguinal regions LUNGS: clear to auscultation and  No wheeze or crackles HEART/CVS: regular rate & rhythm and no murmurs; No lower extremity edema ABDOMEN: abdomen soft, non-tender and normal bowel sounds Musculoskeletal:no cyanosis of digits and no clubbing  PSYCH: alert & oriented x 3  NEURO: no focal motor/sensory deficits SKIN:  no rashes or significant lesions Breast exam is performed in seated and lying down position. Patient is status post bilateral mastectomy.  Scars are well healed.  LABORATORY DATA:  I have reviewed the data as listed Lab Results  Component Value Date   WBC 7.1 10/22/2016   HGB 12.9 10/22/2016   HCT 38.2 10/22/2016   MCV 89.7 10/22/2016   PLT 310 10/22/2016    Recent Labs  04/17/16 1215 07/05/16 1239 10/22/16 1018 11/28/16 1101  NA 136 139 136 136  K  3.7 3.9 4.5 4.1  CL 104 108 103 105  CO2 '27 27 28 26  '$ GLUCOSE 132* 124* 127* 183*  BUN '16 12 11 14  '$ CREATININE 0.71 0.63 0.67 0.65  CALCIUM 9.8 9.7 9.7 9.7  GFRNONAA >60 >60 >60 >60  GFRAA >60 >60 >60 >60  PROT 7.4 7.0 6.6  --   ALBUMIN 4.6 4.1 4.0  --   AST '18 19 15  '$ --   ALT 13* 14 11*  --   ALKPHOS 72 74 76  --   BILITOT 0.4 1.3* 0.8  --     RADIOGRAPHIC STUDIES: I have personally reviewed the radiological images as listed and agreed with the findings in the report. Ct Chest W Contrast  Result Date: 10/30/2016 CLINICAL DATA:  Breast cancer staging, clinically stage III. EXAM: CT CHEST, ABDOMEN, AND PELVIS WITH CONTRAST TECHNIQUE: Multidetector CT imaging of the chest, abdomen and pelvis was performed following the standard protocol during bolus administration of intravenous contrast. CONTRAST:  160m ISOVUE-300 IOPAMIDOL (ISOVUE-300) INJECTION 61% COMPARISON:  08/30/2016 FINDINGS: CT CHEST FINDINGS Cardiovascular: Unremarkable Mediastinum/Nodes: Subtle hypodensity inferiorly in the left thyroid lobe, was not hypermetabolic on 003/70/4888 A left axillary node measures 0.8 cm in short axis on image 18/2. This may represent the same lymph node that measured 1.1 cm in short axis on 08/30/2016 although this is uncertain given the differences in arm positioning. No pathologically enlarged adenopathy in the chest identified. Lungs/Pleura: 3 mm right middle lobe nodule on image 71/4. 2 by 3 mm right middle lobe nodule on image 70/4. Both are stable from 12/20/2011. Nodularity along the right inferior pulmonary ligament is likewise stable from 2013 and considered benign. 0.5 by 0.7 cm nodule along the left hemidiaphragm is stable from 12/20/2011 and considered benign. Musculoskeletal: Bilateral mastectomies with postoperative fluid collections noted. Thoracic spondylosis. CT ABDOMEN PELVIS FINDINGS Hepatobiliary: Cholecystectomy.  Otherwise unremarkable. Pancreas: Unremarkable Spleen: Unremarkable  Adrenals/Urinary Tract: 2 mm left kidney lower pole nonobstructive renal calculus. Scarring noted in both kidneys, left greater than right. 6 mm hypodense lesion in the right mid kidney on image 11/7, likely benign but technically nonspecific. Stomach/Bowel: Scattered colonic diverticula. Sigmoid colon diverticulosis. Vascular/Lymphatic: Unremarkable Reproductive: Uterus absent.  Adnexa unremarkable. Other: No supplemental non-categorized findings. Musculoskeletal: Unremarkable IMPRESSION: 1. Interval bilateral mastectomies with postoperative fluid collections along the mastectomy sites. There is an 8 mm left axillary lymph nodes but  no pathologic adenopathy is identified. 2. Several pulmonary nodules are stable from 2013 and considered benign. 3. There is some mild bilateral renal scarring, right greater than left. 4. Sigmoid colon diverticulosis. 5. 2 mm left kidney lower pole nonobstructive renal calculus. Electronically Signed   By: Van Clines M.D.   On: 10/30/2016 09:39   Nm Bone Scan Whole Body  Result Date: 10/30/2016 CLINICAL DATA:  Breast cancer EXAM: NUCLEAR MEDICINE WHOLE BODY BONE SCAN TECHNIQUE: Whole body anterior and posterior images were obtained approximately 3 hours after intravenous injection of radiopharmaceutical. RADIOPHARMACEUTICALS:  23.58 mCi Technetium-79mMDP IV COMPARISON:  None. FINDINGS: No areas of suspicious bony uptake to suggest osseous metastatic disease. Soft tissue activity unremarkable. IMPRESSION: No evidence of bony metastatic disease Electronically Signed   By: KRolm BaptiseM.D.   On: 10/30/2016 15:00   Ct Abdomen Pelvis W Contrast  Result Date: 10/30/2016 CLINICAL DATA:  Breast cancer staging, clinically stage III. EXAM: CT CHEST, ABDOMEN, AND PELVIS WITH CONTRAST TECHNIQUE: Multidetector CT imaging of the chest, abdomen and pelvis was performed following the standard protocol during bolus administration of intravenous contrast. CONTRAST:  106mISOVUE-300  IOPAMIDOL (ISOVUE-300) INJECTION 61% COMPARISON:  08/30/2016 FINDINGS: CT CHEST FINDINGS Cardiovascular: Unremarkable Mediastinum/Nodes: Subtle hypodensity inferiorly in the left thyroid lobe, was not hypermetabolic on 0724/40/1027A left axillary node measures 0.8 cm in short axis on image 18/2. This may represent the same lymph node that measured 1.1 cm in short axis on 08/30/2016 although this is uncertain given the differences in arm positioning. No pathologically enlarged adenopathy in the chest identified. Lungs/Pleura: 3 mm right middle lobe nodule on image 71/4. 2 by 3 mm right middle lobe nodule on image 70/4. Both are stable from 12/20/2011. Nodularity along the right inferior pulmonary ligament is likewise stable from 2013 and considered benign. 0.5 by 0.7 cm nodule along the left hemidiaphragm is stable from 12/20/2011 and considered benign. Musculoskeletal: Bilateral mastectomies with postoperative fluid collections noted. Thoracic spondylosis. CT ABDOMEN PELVIS FINDINGS Hepatobiliary: Cholecystectomy.  Otherwise unremarkable. Pancreas: Unremarkable Spleen: Unremarkable Adrenals/Urinary Tract: 2 mm left kidney lower pole nonobstructive renal calculus. Scarring noted in both kidneys, left greater than right. 6 mm hypodense lesion in the right mid kidney on image 11/7, likely benign but technically nonspecific. Stomach/Bowel: Scattered colonic diverticula. Sigmoid colon diverticulosis. Vascular/Lymphatic: Unremarkable Reproductive: Uterus absent.  Adnexa unremarkable. Other: No supplemental non-categorized findings. Musculoskeletal: Unremarkable IMPRESSION: 1. Interval bilateral mastectomies with postoperative fluid collections along the mastectomy sites. There is an 8 mm left axillary lymph nodes but no pathologic adenopathy is identified. 2. Several pulmonary nodules are stable from 2013 and considered benign. 3. There is some mild bilateral renal scarring, right greater than left. 4. Sigmoid colon  diverticulosis. 5. 2 mm left kidney lower pole nonobstructive renal calculus. Electronically Signed   By: WaVan Clines.D.   On: 10/30/2016 09:39   Dg Bone Density  Result Date: 11/07/2016 EXAM: DUAL X-RAY ABSORPTIOMETRY (DXA) FOR BONE MINERAL DENSITY IMPRESSION: Dear Dr. YuTasia CatchingsYour patient MaLilyann Gravelleompleted a BMD test on 11/07/2016 using the LuOnekamaanalysis version: 14.10) manufactured by GE[employer]The following summarizes the results of our evaluation. PATIENT BIOGRAPHICAL: Name: PaAnnaliesa, Blannatient ID: [id number] Date: [birth year]eight: [age] in. Gender: Female Exam Date: 11/07/2016 Weight: 167.3 lbs. Indications: Advanced Age, History of Breast Cancer, Hysterectomy, Oophorectomy Bilateral, Postmenopausal Fractures: Treatments: allegra, aspirin, Flonase, Vitamin D ASSESSMENT: The BMD measured at Femur Neck Left is 0.707 g/cm2 with a T-score of -  2.4. This patient is considered osteopenic according to [facility]) criteria. Site Region Measured Measured WHO Young Adult BMD Date       Age      Classification T-score AP Spine L1-L4 11/07/2016 [age] Osteopenia -1.8 0.977 g/cm2 DualFemur Neck Left 11/07/2016 73.8 Osteopenia -2.4 0.707 g/cm2 World Health Organization [facility]) criteria for post-menopausal, Caucasian Women: Normal:       T-score at or above -1 SD Osteopenia:   T-score between -1 and -2.5 SD Osteoporosis: T-score at or below -2.5 SD RECOMMENDATIONS: [place] recommends that FDA-approved medical therapies be considered in postmenopausal women and men age 52 or older with a: 1. Hip or vertebral (clinical or morphometric) fracture. 2. T-score of < -2.5 at the spine or hip. 3. Ten-year fracture probability by FRAX of 3% or greater for hip fracture or 20% or greater for major osteoporotic fracture. All treatment decisions require clinical judgment and consideration of individual patient factors, including patient preferences,  co-morbidities, previous drug use, risk factors not captured in the FRAX model (e.g. falls, vitamin D deficiency, increased bone turnover, interval significant decline in bone density) and possible under - or over-estimation of fracture risk by FRAX. All patients should ensure an adequate intake of dietary calcium (1200 mg/d) and vitamin D (800 IU daily) unless contraindicated. FOLLOW-UP: People with diagnosed cases of osteoporosis or at high risk for fracture should have regular bone mineral density tests. For patients eligible for Medicare, routine testing is allowed once every 2 years. The testing frequency can be increased to one year for patients who have rapidly progressing disease, those who are receiving or discontinuing medical therapy to restore bone mass, or have additional risk factors. I have reviewed this report, and agree with the above findings. [facility] Radiology Dear Dr. Tasia Catchings, Your patient SHAMELA HAYDON completed a FRAX assessment on 11/07/2016 using the [place] (analysis version: 14.10) manufactured by [employer]. The following summarizes the results of our evaluation. PATIENT BIOGRAPHICAL: Name: Theodora, Lalanne Patient ID: [id number] Birth Date: 06-05-1940 Height:    63.0 in. Gender:     [age]    [age]       Weight:    167.3 lbs. Ethnicity:  Black                            Exam Date: 11/07/2016 FRAX* RESULTS:  (version: 3.5) 10-year Probability of Fracture1 Major Osteoporotic Fracture2 Hip Fracture 6.8% 1.9% Population: [place] (Black) Risk Factors: None Based on Femur (Left) Neck BMD 1 -The 10-year probability of fracture may be lower than reported if the patient has received treatment. 2 -Major Osteoporotic Fracture: Clinical Spine, Forearm, Hip or Shoulder *FRAX is a Materials engineer of the [employer] for Metabolic Bone Disease, a [place] (WHO) [facility]. ASSESSMENT: The probability of a major osteoporotic  fracture is 6.8% within the next ten years. The probability of a hip fracture is 1.9% within the next ten years. . Electronically Signed   By: Lowella Grip III M.D.   On: 11/07/2016 10:24   Pathology 10/01/2016 SPECIMEN SUBMITTED:  A. Breast, left, and axillary contents  B. Breast, right; simple mastectomy  C. Sentinel node 1, right  DIAGNOSIS:  A. LEFT BREAST AND AXILLARY CONTENTS; MODIFIED RADICAL MASTECTOMY:  - INVASIVE CARCINOMA WITH DUCTAL AND LOBULAR FEATURES.  - LARGEST FOCUS OF INVASIVE CARCINOMA MEASURES 10 MM.  -  NINETEEN OF NINETEEN LYMPH NODES POSITIVE FOR METASTASIS (19/19).  - BIOPSY SITE CHANGES, MARKER CLIP PRESENT.  - SURGICAL MARGINS ARE NEGATIVE.  B. RIGHT BREAST; SIMPLE MASTECTOMY:  - FIBROCYSTIC CHANGE.  - NEGATIVE FOR ATYPIA AND MALIGNANCY.  C. SENTINEL LYMPH NODE 1, RIGHT; EXCISION:  - NO TUMOR SEEN IN ONE LYMPH NODE (0/1).  Surgical Pathology Cancer Case Summary   INVASIVE CARCINOMA OF THEBREAST  Procedure: modified radical mastectomy  Specimen Laterality: left  Histologic Type: invasive carcinoma with ductal and lobular features  Histologic Grade (Nottingham Histologic Score)       Glandular (Acinar)/Tubular Differentiation: 3       Nuclear Pleomorphism: 1       Mitotic Rate: 1       Overall Grade: 1  Tumor Size: 10 mm  Ductal Carcinoma in Situ (DCIS): present       Nuclear Grade: 1       Extensive intraductal component: negative  Lymphovascular Invasion: present  Treatment Effect: not applicable  Margins:       Invasive carcinoma: margins negative       Distance from closest margin: 20 mm from deep margin       DCIS: margins negative         Distance from closest margin: 20 mm from deep margin  Regional Lymph nodes:    Total # lymph nodes examined: 19    # Sentinel lymph nodes examined: 0    # Lymph nodes with macrometastasis (>2.0 mm): 18    # Lymph nodes with  isolated tumor cells (<0.33m): 0    # Lymph nodes with micrometastasis (> 0.2 mm and < 2.0 mm): 1    Extranodal extension: focally present  Pathologic Stage Classification (pTNM, AJCC 7th Edition)    TNM Descriptors: m (multiple foci of invasive carcinoma)    pTNM: mpT1b pN3a   Note: Two masses are identified. The 3rd grossly described nodule in the  upper outer quadrant is an intramammary node nearly replaced by tumor.  Biomarker testing was performed on a separate specimen and in summary:  Estrogen Receptor (ER) Status: POSITIVE, >90%    Average intensity of staining: Strong  Progesterone Receptor (PgR) Status: POSITIVE, >90%    Average intensity of staining: Moderate  HER2 (by immunohistochemistry): NEGATIVE (Score 1+)    Pathology 10/01/2016 SPECIMEN SUBMITTED:  A. Breast, left, 4:00, 3 CMFN, biopsy  B. Breast, left, 4:00, 6 CMFN, biopsy  C. Lymph node, left axilla, biopsy  DIAGNOSIS:  A. BREAST, LEFT, 4 O'CLOCK 3 CM FROM NIPPLE; ULTRASOUND-GUIDED CORE  BIOPSY:  - INVASIVE LOBULAR CARCINOMA, CLASSIC TYPE.  Size of invasive carcinoma: 6 mm in this sample  Histologic grade of invasive carcinoma: Grade 1    Glandular/tubular differentiation score: 3    Nuclear pleomorphism score: 1    Mitotic rate score: 1    Total score: 5  Ductal carcinomain situ: Present, low grade  Lymphovascular invasion: Not identified  B. BREAST, LEFT, 4 O'CLOCK 6 CM FROM NIPPLE; ULTRASOUND-GUIDED CORE  BIOPSY:  - INVASIVE LOBULAR CARCINOMA, CLASSIC TYPE.  Size of invasive carcinoma: 6 mm in this sample  Histologic grade of invasive carcinoma: Grade 1    Glandular/tubular differentiation score: 3    Nuclear pleomorphism score: 1    Mitotic rate score: 1    Total score: 5  Ductal carcinoma in situ: Not identified  Lymphovascular invasion: Not identified  C. LYMPH NODE, LEFT AXILLA; ULTRASOUND-GUIDED CORE BIOPSY:  - METASTATIC LOBULAR CARCINOMA; TUMOR  SPANS 7  MM.  - NO EXTRANODAL EXTENSION IN THIS SAMPLE.   ASSESSMENT & PLAN:  Cancer Staging Malignant neoplasm of lower-outer quadrant of left breast of female, estrogen receptor positive ([place]) Staging form: Breast, AJCC 8th Edition - Clinical stage from 10/22/2016: Stage IIIB (cT1b(m), cN3a, cM0, G1, ER: Positive, PR: Positive, HER2: Negative) - Signed by Earlie Server, MD on 10/22/2016 - Pathologic stage from 10/22/2016: pT1b(m), pN3a, cM0, ER: Positive, PR: Positive, HER2: Negative - Signed by Earlie Server, MD on 10/22/2016  1. Malignant neoplasm of lower-outer quadrant of left breast of female, estrogen receptor positive ([place])   2. Lobular carcinoma in situ (LCIS) of left breast   3. Osteopenia of neck of left femur     76 yo female with stage III B, ER/PR positive HER-2 negative invasive carcinoma with both ductal and lobular features. She has 19 out of 19 axillary lymph nodes positive on the left. S/p lumpectomy and declined both chemotherapy and radiation, persistent elevated CA 27.29, CT and bone scan negative for metastasis  Continue Arimidex 1 mg by mouth daily.   Offered adjuvant Zometa per ASCO guideline as Zometa reduced bone recurrence and  Improved survival  in high risk postmenopausal patients. Zometa side effects including jaw necrosis is discussed with patient. Patient has no ongoing dental problem/dental work. She has not teeth and wears dentures. Zometa will also improve her bone density. Patient agrees with plan and will schedule an appointment for infusion.   All questions were answered. The patient knows to call the clinic with any problems questions or concerns.  Return of visit:  4-5 weeks for evaluation before second dose of zometa.     Earlie Server, MD, PhD Hematology Oncology [facility] Pager- [phone] 11/28/2016

## 2016-11-28 NOTE — Progress Notes (Signed)
Patient ID: Norma Johns, female   DOB: May 16, 1940, 76 y.o.   MRN: [id number]  Chief Complaint  Patient presents with  . Follow-up    HPI Norma Johns is a 76 y.o. female here today for her follow up bilateral; mastectomy. Patient states she is doing well. She thinks there is fluid building up again.  HPI  Past Medical History:  Diagnosis Date  . Anemia   . Anxiety   . Arthritis   . Asthma    WELL CONTROLLED  . Breast cancer ([place]) 2018   left breast cnacer  . Cancer ([place])    Bilat Mastectomy  . Depression   . GERD (gastroesophageal reflux disease)   . Headache    H/O  . Hyperlipidemia   . Sleep apnea    DOES NOT USE CPAP-COULD NOT TOLERATE  . Stroke [facility]) 2004    Past Surgical History:  Procedure Laterality Date  . ABDOMINAL HYSTERECTOMY    . BLADDER REPAIR    . BREAST BIOPSY Right 03/23/1991   Fibroadenoma with intramammary lymph node. Right breast, 9:00.  Marland Kitchen CHOLECYSTECTOMY    . COLONOSCOPY  2005  . MASTECTOMY MODIFIED RADICAL Left 10/01/2016   Procedure: MASTECTOMY MODIFIED RADICAL;  Surgeon: Robert Bellow, MD;  Location: [facility] ORS;  Service: General;  Laterality: Left;  . MR MRA CAROTID  02/2010   Minimal plaque formation; no significant stenosis. Sx: Syncope  . [place] [facility]  . SENTINEL NODE BIOPSY Right 10/01/2016   Procedure: SENTINEL NODE BIOPSY;  Surgeon: Robert Bellow, MD;  Location: [facility] ORS;  Service: General;  Laterality: Right;  . SIMPLE MASTECTOMY WITH AXILLARY SENTINEL NODE BIOPSY Right 10/01/2016   Procedure: SIMPLE MASTECTOMY;  Surgeon: Robert Bellow, MD;  Location: [facility] ORS;  Service: General;  Laterality: Right;    Family History  Problem Relation Age of Onset  . Cancer Father   . Prostate cancer Father   . Heart attack Mother   . Hypertension Mother   . CAD Mother   . Hypertension Son   . Diabetes Son   . Lung cancer Brother   . Leukemia Brother   . Lung cancer Brother   . Prostate cancer Brother    . Breast cancer Neg Hx     Social History Social History  Substance Use Topics  . Smoking status: Never Smoker  . Smokeless tobacco: Never Used  . Alcohol use No    Allergies  Allergen Reactions  . Chocolate Shortness Of Breath  . Other Shortness Of Breath and Swelling    PT STATES ALLERGY TO "SOME TYPE OF DYE THAT WAS USED AT [facility]."  SWELLING TO ARM AND SOB.  PT CANNOT REMEMBER WHAT THE DYE WAS USED FOR.  . Sulfa Antibiotics Other (See Comments)    unknown  . Atorvastatin Other (See Comments)    Leg pain   . Minocycline Hcl Itching  . Naproxen Other (See Comments)    Indigestion  . Niacin Other (See Comments)    Felt like she was on fire  . Septra [Sulfamethoxazole-Trimethoprim] Other (See Comments)    unknown  . Vioxx [Rofecoxib] Other (See Comments)    unknown  . Penicillins Itching and Rash    Has patient had a PCN reaction causing immediate rash, facial/tongue/throat swelling, SOB or lightheadedness with hypotension: Unknown Has patient had a PCN reaction causing severe rash involving mucus membranes or skin necrosis: Unknown Has patient had a PCN reaction that  required hospitalization: No Has patient had a PCN reaction occurring within the last 10 years: No If all of the above answers are "NO", then may proceed with Cephalosporin use.     Current Outpatient Prescriptions  Medication Sig Dispense Refill  . acetaminophen (TYLENOL) 500 MG tablet Take 1,000 mg by mouth every 6 (six) hours as needed for mild pain or headache.    . albuterol (PROVENTIL HFA;VENTOLIN HFA) 108 (90 Base) MCG/ACT inhaler Inhale 2 puffs into the lungs every 4 (four) hours as needed for wheezing or shortness of breath. 1 Inhaler 11  . ALPRAZolam (XANAX) 0.5 MG tablet Take 1 tablet (0.5 mg total) by mouth at bedtime as needed for anxiety. (Patient taking differently: Take 0.5 mg by mouth as needed for anxiety. ) 30 tablet 5  . anastrozole (ARIMIDEX) 1 MG tablet Take 1 tablet (1 mg  total) by mouth daily. 30 tablet 3  . aspirin EC 81 MG tablet Take 1 tablet (81 mg total) by mouth daily. 1 tablet 0  . cyclobenzaprine (FLEXERIL) 5 MG tablet Take 1 tablet (5 mg total) by mouth 3 (three) times daily as needed for muscle spasms. 15 tablet 0  . fexofenadine (ALLEGRA) 180 MG tablet Take 180 mg by mouth 3 (three) times a week. PRN    . fluticasone (FLONASE) 50 MCG/ACT nasal spray Place 2 sprays into both nostrils daily. (Patient taking differently: Place 2 sprays into both nostrils 2 (two) times daily as needed for allergies. ) 16 g 5  . Multiple Vitamin (MULTIVITAMIN) capsule Take 1 capsule by mouth daily.    . pantoprazole (PROTONIX) 40 MG tablet Take 1 tablet (40 mg total) by mouth 2 (two) times daily before a meal. 180 tablet 1  . polyethylene glycol (MIRALAX / GLYCOLAX) packet Take 17 g by mouth daily as needed for mild constipation.    . vitamin B-12 (CYANOCOBALAMIN) 1000 MCG tablet Take 1,000 mcg by mouth daily.     No current facility-administered medications for this visit.     Review of Systems Review of Systems  Constitutional: Negative.   Respiratory: Negative.   Cardiovascular: Negative.     Blood pressure (!) 146/74, pulse 66, resp. rate 14, height 5\' 4"  (1.626 m), weight 167 lb (75.8 kg).  Physical Exam Physical Exam  Constitutional: She is oriented to person, place, and time. She appears well-developed and well-nourished.  Neurological: She is alert and oriented to person, place, and time.  Skin: Skin is warm and dry.   Right drained 42 ml of fluid.  Left drained 54 ml of fluid.     Assessment    Slow resolution of seroma status post bilateral mastectomy.    Plan    Emotional stress with her son-in-law's gambling issues.    Patient to return in one week.. The patient is aware to call back for any questions or concerns.   HPI, Physical Exam, Assessment and Plan have been scribed under the direction and in the presence of Hervey Ard,  MD.  Gaspar Cola, CMA  I have completed the exam and reviewed the above documentation for accuracy and completeness.  I agree with the above.  Haematologist has been used and any errors in dictation or transcription are unintentional.  Hervey Ard, M.D., F.A.C.S.   Robert Bellow 11/28/2016, 10:47 AM

## 2016-11-28 NOTE — Progress Notes (Signed)
No Zometa today, per MD

## 2016-11-28 NOTE — Patient Instructions (Addendum)
Patient to return in one weeks. The patient is aware to call back for any questions or concerns.

## 2016-11-29 ENCOUNTER — Ambulatory Visit: Payer: Medicare Other

## 2016-11-30 ENCOUNTER — Ambulatory Visit: Payer: Medicare Other

## 2016-12-03 ENCOUNTER — Ambulatory Visit: Payer: Medicare Other

## 2016-12-04 ENCOUNTER — Ambulatory Visit: Payer: Medicare Other

## 2016-12-05 ENCOUNTER — Ambulatory Visit: Payer: Medicare Other

## 2016-12-05 ENCOUNTER — Inpatient Hospital Stay: Payer: Medicare Other

## 2016-12-05 VITALS — BP 162/80 | HR 72 | Temp 97.5°F | Resp 18

## 2016-12-05 DIAGNOSIS — Z9049 Acquired absence of other specified parts of digestive tract: Secondary | ICD-10-CM | POA: Diagnosis not present

## 2016-12-05 DIAGNOSIS — Z9013 Acquired absence of bilateral breasts and nipples: Secondary | ICD-10-CM | POA: Diagnosis not present

## 2016-12-05 DIAGNOSIS — K5730 Diverticulosis of large intestine without perforation or abscess without bleeding: Secondary | ICD-10-CM | POA: Diagnosis not present

## 2016-12-05 DIAGNOSIS — Z8673 Personal history of transient ischemic attack (TIA), and cerebral infarction without residual deficits: Secondary | ICD-10-CM | POA: Diagnosis not present

## 2016-12-05 DIAGNOSIS — Z79899 Other long term (current) drug therapy: Secondary | ICD-10-CM | POA: Diagnosis not present

## 2016-12-05 DIAGNOSIS — J45909 Unspecified asthma, uncomplicated: Secondary | ICD-10-CM | POA: Diagnosis not present

## 2016-12-05 DIAGNOSIS — Z79811 Long term (current) use of aromatase inhibitors: Secondary | ICD-10-CM | POA: Diagnosis not present

## 2016-12-05 DIAGNOSIS — K219 Gastro-esophageal reflux disease without esophagitis: Secondary | ICD-10-CM | POA: Diagnosis not present

## 2016-12-05 DIAGNOSIS — N200 Calculus of kidney: Secondary | ICD-10-CM | POA: Diagnosis not present

## 2016-12-05 DIAGNOSIS — G4730 Sleep apnea, unspecified: Secondary | ICD-10-CM | POA: Diagnosis not present

## 2016-12-05 DIAGNOSIS — M8580 Other specified disorders of bone density and structure, unspecified site: Secondary | ICD-10-CM | POA: Diagnosis not present

## 2016-12-05 DIAGNOSIS — Z801 Family history of malignant neoplasm of trachea, bronchus and lung: Secondary | ICD-10-CM | POA: Diagnosis not present

## 2016-12-05 DIAGNOSIS — R918 Other nonspecific abnormal finding of lung field: Secondary | ICD-10-CM | POA: Diagnosis not present

## 2016-12-05 DIAGNOSIS — Z8042 Family history of malignant neoplasm of prostate: Secondary | ICD-10-CM | POA: Diagnosis not present

## 2016-12-05 DIAGNOSIS — E785 Hyperlipidemia, unspecified: Secondary | ICD-10-CM | POA: Diagnosis not present

## 2016-12-05 DIAGNOSIS — D649 Anemia, unspecified: Secondary | ICD-10-CM | POA: Diagnosis not present

## 2016-12-05 DIAGNOSIS — C50512 Malignant neoplasm of lower-outer quadrant of left female breast: Secondary | ICD-10-CM

## 2016-12-05 DIAGNOSIS — Z170 Estrogen receptor positive status [ER+]: Principal | ICD-10-CM

## 2016-12-05 DIAGNOSIS — C7951 Secondary malignant neoplasm of bone: Secondary | ICD-10-CM | POA: Diagnosis not present

## 2016-12-05 MED ORDER — ZOLEDRONIC ACID 4 MG/100ML IV SOLN
4.0000 mg | Freq: Once | INTRAVENOUS | Status: AC
  Administered 2016-12-05: 4 mg via INTRAVENOUS
  Filled 2016-12-05: qty 100

## 2016-12-05 MED ORDER — SODIUM CHLORIDE 0.9 % IV SOLN
Freq: Once | INTRAVENOUS | Status: AC
  Administered 2016-12-05: 14:00:00 via INTRAVENOUS
  Filled 2016-12-05: qty 1000

## 2016-12-06 ENCOUNTER — Ambulatory Visit (INDEPENDENT_AMBULATORY_CARE_PROVIDER_SITE_OTHER): Payer: Medicare Other | Admitting: General Surgery

## 2016-12-06 ENCOUNTER — Encounter: Payer: Self-pay | Admitting: General Surgery

## 2016-12-06 ENCOUNTER — Ambulatory Visit: Payer: Medicare Other

## 2016-12-06 VITALS — BP 134/72 | HR 68 | Resp 14 | Ht 64.0 in | Wt 169.0 lb

## 2016-12-06 DIAGNOSIS — N6489 Other specified disorders of breast: Secondary | ICD-10-CM

## 2016-12-06 NOTE — Progress Notes (Signed)
Patient ID: Norma Johns, female   DOB: 25-Oct-1940, 76 y.o.   MRN: [id number]  No chief complaint on file.   HPI Norma Johns is a 76 y.o. female female here today for her follow up bilateral; mastectomy. Patient states she doing okay. She states the left mastectomy site is sore.  HPI  Past Medical History:  Diagnosis Date  . Anemia   . Anxiety   . Arthritis   . Asthma    WELL CONTROLLED  . Breast cancer ([place]) 2018   left breast cnacer  . Cancer ([place])    Bilat Mastectomy  . Depression   . GERD (gastroesophageal reflux disease)   . Headache    H/O  . Hyperlipidemia   . Sleep apnea    DOES NOT USE CPAP-COULD NOT TOLERATE  . Stroke [facility]) 2004    Past Surgical History:  Procedure Laterality Date  . ABDOMINAL HYSTERECTOMY    . BLADDER REPAIR    . BREAST BIOPSY Right 03/23/1991   Fibroadenoma with intramammary lymph node. Right breast, 9:00.  Marland Kitchen CHOLECYSTECTOMY    . COLONOSCOPY  2005  . MASTECTOMY MODIFIED RADICAL Left 10/01/2016   Procedure: MASTECTOMY MODIFIED RADICAL;  Surgeon: Robert Bellow, MD;  Location: [facility] ORS;  Service: General;  Laterality: Left;  . MR MRA CAROTID  02/2010   Minimal plaque formation; no significant stenosis. Sx: Syncope  . [place] [facility]  . SENTINEL NODE BIOPSY Right 10/01/2016   Procedure: SENTINEL NODE BIOPSY;  Surgeon: Robert Bellow, MD;  Location: [facility] ORS;  Service: General;  Laterality: Right;  . SIMPLE MASTECTOMY WITH AXILLARY SENTINEL NODE BIOPSY Right 10/01/2016   Procedure: SIMPLE MASTECTOMY;  Surgeon: Robert Bellow, MD;  Location: [facility] ORS;  Service: General;  Laterality: Right;    Family History  Problem Relation Age of Onset  . Cancer Father   . Prostate cancer Father   . Heart attack Mother   . Hypertension Mother   . CAD Mother   . Hypertension Son   . Diabetes Son   . Lung cancer Brother   . Leukemia Brother   . Lung cancer Brother   . Prostate cancer Brother   . Breast cancer Neg  Hx     Social History Social History  Substance Use Topics  . Smoking status: Never Smoker  . Smokeless tobacco: Never Used  . Alcohol use No    Allergies  Allergen Reactions  . Chocolate Shortness Of Breath  . Other Shortness Of Breath and Swelling    PT STATES ALLERGY TO "SOME TYPE OF DYE THAT WAS USED AT [facility]."  SWELLING TO ARM AND SOB.  PT CANNOT REMEMBER WHAT THE DYE WAS USED FOR.  . Sulfa Antibiotics Other (See Comments)    unknown  . Atorvastatin Other (See Comments)    Leg pain   . Minocycline Hcl Itching  . Naproxen Other (See Comments)    Indigestion  . Niacin Other (See Comments)    Felt like she was on fire  . Septra [Sulfamethoxazole-Trimethoprim] Other (See Comments)    unknown  . Vioxx [Rofecoxib] Other (See Comments)    unknown  . Penicillins Itching and Rash    Has patient had a PCN reaction causing immediate rash, facial/tongue/throat swelling, SOB or lightheadedness with hypotension: Unknown Has patient had a PCN reaction causing severe rash involving mucus membranes or skin necrosis: Unknown Has patient had a PCN reaction that required hospitalization: No Has patient  had a PCN reaction occurring within the last 10 years: No If all of the above answers are "NO", then may proceed with Cephalosporin use.     Current Outpatient Prescriptions  Medication Sig Dispense Refill  . acetaminophen (TYLENOL) 500 MG tablet Take 1,000 mg by mouth every 6 (six) hours as needed for mild pain or headache.    . albuterol (PROVENTIL HFA;VENTOLIN HFA) 108 (90 Base) MCG/ACT inhaler Inhale 2 puffs into the lungs every 4 (four) hours as needed for wheezing or shortness of breath. 1 Inhaler 11  . ALPRAZolam (XANAX) 0.5 MG tablet Take 1 tablet (0.5 mg total) by mouth at bedtime as needed for anxiety. (Patient taking differently: Take 0.5 mg by mouth as needed for anxiety. ) 30 tablet 5  . anastrozole (ARIMIDEX) 1 MG tablet Take 1 tablet (1 mg total) by mouth daily.  30 tablet 3  . fexofenadine (ALLEGRA) 180 MG tablet Take 180 mg by mouth 3 (three) times a week. PRN    . fluticasone (FLONASE) 50 MCG/ACT nasal spray Place 2 sprays into both nostrils daily. (Patient taking differently: Place 2 sprays into both nostrils 2 (two) times daily as needed for allergies. ) 16 g 5  . Multiple Vitamin (MULTIVITAMIN) capsule Take 1 capsule by mouth daily.    . pantoprazole (PROTONIX) 40 MG tablet Take 1 tablet (40 mg total) by mouth 2 (two) times daily before a meal. 180 tablet 1  . polyethylene glycol (MIRALAX / GLYCOLAX) packet Take 17 g by mouth daily as needed for mild constipation.    . vitamin B-12 (CYANOCOBALAMIN) 1000 MCG tablet Take 1,000 mcg by mouth daily.     No current facility-administered medications for this visit.     Review of Systems Review of Systems  Constitutional: Negative.   Respiratory: Negative.   Cardiovascular: Negative.     Blood pressure 134/72, pulse 68, resp. rate 14, height 5\' 4"  (1.626 m), weight 169 lb (76.7 kg).  Physical Exam Physical Exam  Constitutional: She appears well-developed and well-nourished.  Neurological: She is alert.  Skin: Skin is warm and dry.  Drained left 55 ml and right 35 ml.     Assessment    Postoperative seroma.     Plan      Patient to return in one week.   HPI, Physical Exam, Assessment and Plan have been scribed under the direction and in the presence of Hervey Ard, MD.  Gaspar Cola, CMA  I have completed the exam and reviewed the above documentation for accuracy and completeness.  I agree with the above.  Haematologist has been used and any errors in dictation or transcription are unintentional.  Hervey Ard, M.D., F.A.C.S.   Robert Bellow 12/06/2016, 8:11 PM

## 2016-12-06 NOTE — Patient Instructions (Signed)
Return in one week.  

## 2016-12-07 ENCOUNTER — Ambulatory Visit: Payer: Medicare Other

## 2016-12-10 ENCOUNTER — Ambulatory Visit: Payer: Medicare Other

## 2016-12-11 ENCOUNTER — Ambulatory Visit: Payer: Medicare Other

## 2016-12-12 ENCOUNTER — Ambulatory Visit (INDEPENDENT_AMBULATORY_CARE_PROVIDER_SITE_OTHER): Payer: Medicare Other | Admitting: Physician Assistant

## 2016-12-12 ENCOUNTER — Ambulatory Visit: Payer: Medicare Other

## 2016-12-12 ENCOUNTER — Encounter: Payer: Self-pay | Admitting: General Surgery

## 2016-12-12 ENCOUNTER — Ambulatory Visit (INDEPENDENT_AMBULATORY_CARE_PROVIDER_SITE_OTHER): Payer: Medicare Other | Admitting: General Surgery

## 2016-12-12 VITALS — BP 128/74 | HR 80 | Temp 98.3°F | Resp 16 | Wt 166.6 lb

## 2016-12-12 VITALS — BP 132/74 | HR 80 | Resp 12 | Ht 63.0 in | Wt 166.0 lb

## 2016-12-12 DIAGNOSIS — C50512 Malignant neoplasm of lower-outer quadrant of left female breast: Secondary | ICD-10-CM

## 2016-12-12 DIAGNOSIS — F5101 Primary insomnia: Secondary | ICD-10-CM | POA: Diagnosis not present

## 2016-12-12 DIAGNOSIS — J0140 Acute pansinusitis, unspecified: Secondary | ICD-10-CM

## 2016-12-12 DIAGNOSIS — Z170 Estrogen receptor positive status [ER+]: Secondary | ICD-10-CM

## 2016-12-12 DIAGNOSIS — N6489 Other specified disorders of breast: Secondary | ICD-10-CM

## 2016-12-12 MED ORDER — TRAZODONE HCL 50 MG PO TABS: 25.0000 mg | ORAL_TABLET | Freq: Every evening | ORAL | 3 refills | Status: DC | PRN

## 2016-12-12 MED ORDER — PREDNISONE 10 MG (21) PO TBPK: ORAL_TABLET | ORAL | 0 refills | Status: DC

## 2016-12-12 MED ORDER — LEVOFLOXACIN 500 MG PO TABS: 500.0000 mg | ORAL_TABLET | Freq: Every day | ORAL | 0 refills | Status: DC

## 2016-12-12 NOTE — Progress Notes (Signed)
Patient ID: Norma Johns, female   DOB: [birth year], 76 y.o.   MRN: [id number]  Chief Complaint  Patient presents with  . Follow-up    HPI Norma Johns is a 76 y.o. female here today for her follow up bilateral seroma. Patient states she feels tight "like she has fluid back". She is exhausted from caring for her brother who has been in the hospital and is going to hospice.  HPI  Past Medical History:  Diagnosis Date  . Anemia   . Anxiety   . Arthritis   . Asthma    WELL CONTROLLED  . Breast cancer ([place]) 2018   left breast cnacer  . Cancer ([place])    Bilat Mastectomy  . Depression   . GERD (gastroesophageal reflux disease)   . Headache    H/O  . Hyperlipidemia   . Sleep apnea    DOES NOT USE CPAP-COULD NOT TOLERATE  . Stroke [facility]) 2004    Past Surgical History:  Procedure Laterality Date  . ABDOMINAL HYSTERECTOMY    . BLADDER REPAIR    . BREAST BIOPSY Right 03/23/1991   Fibroadenoma with intramammary lymph node. Right breast, 9:00.  Marland Kitchen CHOLECYSTECTOMY    . COLONOSCOPY  2005  . MASTECTOMY MODIFIED RADICAL Left 10/01/2016   Procedure: MASTECTOMY MODIFIED RADICAL;  Surgeon: Robert Bellow, MD;  Location: [facility] ORS;  Service: General;  Laterality: Left;  . MR MRA CAROTID  02/2010   Minimal plaque formation; no significant stenosis. Sx: Syncope  . [place] [facility]  . SENTINEL NODE BIOPSY Right 10/01/2016   Procedure: SENTINEL NODE BIOPSY;  Surgeon: Robert Bellow, MD;  Location: [facility] ORS;  Service: General;  Laterality: Right;  . SIMPLE MASTECTOMY WITH AXILLARY SENTINEL NODE BIOPSY Right 10/01/2016   Procedure: SIMPLE MASTECTOMY;  Surgeon: Robert Bellow, MD;  Location: [facility] ORS;  Service: General;  Laterality: Right;    Family History  Problem Relation Age of Onset  . Cancer Father   . Prostate cancer Father   . Heart attack Mother   . Hypertension Mother   . CAD Mother   . Hypertension Son   . Diabetes Son   . Lung cancer Brother    . Leukemia Brother   . Lung cancer Brother   . Prostate cancer Brother   . Breast cancer Neg Hx     Social History Social History  Substance Use Topics  . Smoking status: Never Smoker  . Smokeless tobacco: Never Used  . Alcohol use No    Allergies  Allergen Reactions  . Chocolate Shortness Of Breath  . Other Shortness Of Breath and Swelling    PT STATES ALLERGY TO "SOME TYPE OF DYE THAT WAS USED AT [facility]."  SWELLING TO ARM AND SOB.  PT CANNOT REMEMBER WHAT THE DYE WAS USED FOR.  . Sulfa Antibiotics Other (See Comments)    unknown  . Atorvastatin Other (See Comments)    Leg pain   . Minocycline Hcl Itching  . Naproxen Other (See Comments)    Indigestion  . Niacin Other (See Comments)    Felt like she was on fire  . Septra [Sulfamethoxazole-Trimethoprim] Other (See Comments)    unknown  . Vioxx [Rofecoxib] Other (See Comments)    unknown  . Penicillins Itching and Rash    Has patient had a PCN reaction causing immediate rash, facial/tongue/throat swelling, SOB or lightheadedness with hypotension: Unknown Has patient had a PCN reaction causing severe  rash involving mucus membranes or skin necrosis: Unknown Has patient had a PCN reaction that required hospitalization: No Has patient had a PCN reaction occurring within the last 10 years: No If all of the above answers are "NO", then may proceed with Cephalosporin use.     Current Outpatient Prescriptions  Medication Sig Dispense Refill  . acetaminophen (TYLENOL) 500 MG tablet Take 1,000 mg by mouth every 6 (six) hours as needed for mild pain or headache.    . albuterol (PROVENTIL HFA;VENTOLIN HFA) 108 (90 Base) MCG/ACT inhaler Inhale 2 puffs into the lungs every 4 (four) hours as needed for wheezing or shortness of breath. 1 Inhaler 11  . ALPRAZolam (XANAX) 0.5 MG tablet Take 1 tablet (0.5 mg total) by mouth at bedtime as needed for anxiety. (Patient taking differently: Take 0.5 mg by mouth as needed for anxiety.  ) 30 tablet 5  . anastrozole (ARIMIDEX) 1 MG tablet Take 1 tablet (1 mg total) by mouth daily. 30 tablet 3  . fexofenadine (ALLEGRA) 180 MG tablet Take 180 mg by mouth 3 (three) times a week. PRN    . fluticasone (FLONASE) 50 MCG/ACT nasal spray Place 2 sprays into both nostrils daily. (Patient taking differently: Place 2 sprays into both nostrils 2 (two) times daily as needed for allergies. ) 16 g 5  . Multiple Vitamin (MULTIVITAMIN) capsule Take 1 capsule by mouth daily.    . pantoprazole (PROTONIX) 40 MG tablet Take 1 tablet (40 mg total) by mouth 2 (two) times daily before a meal. 180 tablet 1  . polyethylene glycol (MIRALAX / GLYCOLAX) packet Take 17 g by mouth daily as needed for mild constipation.    . vitamin B-12 (CYANOCOBALAMIN) 1000 MCG tablet Take 1,000 mcg by mouth daily.     No current facility-administered medications for this visit.     Review of Systems Review of Systems  Constitutional: Negative.   Respiratory: Negative.   Cardiovascular: Negative.   Neurological: Positive for headaches.    Blood pressure 132/74, pulse 80, resp. rate 12, height 5\' 3"  (1.6 m), weight 166 lb (75.3 kg).  Physical Exam Physical Exam  Constitutional: She is oriented to person, place, and time. She appears well-developed and well-nourished.  Pulmonary/Chest:    Aspirated 23 ml right chest wall and 5 ml left chest wall  Neurological: She is alert and oriented to person, place, and time.  Skin: Skin is warm and dry.  Psychiatric: Her behavior is normal.    Data Reviewed Medication review shows poor tolerance of nonsteroidals. Bone scan dated 10/30/2016 was negative.  Bone density of 6 11/07/2016 showed osteopenia.  Assessment    Resolving seroma formation postmastectomy.  Residual chest wall discomfort.    Plan    Local heat and Tylenol. Continue stretching exercises.    Follow up in 2 weeks  HPI, Physical Exam, Assessment and Plan have been scribed under the direction  and in the presence of Robert Bellow, MD. Karie Fetch, RN  I have completed the exam and reviewed the above documentation for accuracy and completeness.  I agree with the above.  Haematologist has been used and any errors in dictation or transcription are unintentional.  Hervey Ard, M.D., F.A.C.S.  Robert Bellow 12/12/2016, 10:56 AM

## 2016-12-12 NOTE — Patient Instructions (Signed)

## 2016-12-12 NOTE — Progress Notes (Signed)
Patient: Norma Johns Female    DOB: 1940/09/21   76 y.o.   MRN: [id number] Visit Date: 12/12/2016  Today's Provider: Mar Daring, PA-C   Chief Complaint  Patient presents with  . Sinus Problem   Subjective:    HPI  Patient states she started not feeling well on Saturday 12/08/16 but symptoms did not start till Monday 12/10/16. Symptoms are sinus pressure, sinus pain, head pressure, headache, unsteady feeling , nasal congestion, runny nose, sneezing, right ear pressure, right ear pain, some chills. No fever. No body aches. She takes Allegra daily and has been using Flonase nasal spray for current symptoms.  She also reports not sleeping well. She has been running back and forth with appointments with herself and worrying about her brother who is now hospitalized and will be going into hospice home once stable. She also reports her husband is not doing well again also. She is taking alprazolam 0.5mg  almost nightly. She reports it helps her fall asleep, but she awakens at 2-3am every morning and cannot fall back asleep.     Allergies  Allergen Reactions  . Chocolate Shortness Of Breath  . Other Shortness Of Breath and Swelling    PT STATES ALLERGY TO "SOME TYPE OF DYE THAT WAS USED AT [facility]."  SWELLING TO ARM AND SOB.  PT CANNOT REMEMBER WHAT THE DYE WAS USED FOR.  . Sulfa Antibiotics Other (See Comments)    unknown  . Atorvastatin Other (See Comments)    Leg pain   . Minocycline Hcl Itching  . Naproxen Other (See Comments)    Indigestion  . Niacin Other (See Comments)    Felt like she was on fire  . Septra [Sulfamethoxazole-Trimethoprim] Other (See Comments)    unknown  . Vioxx [Rofecoxib] Other (See Comments)    unknown  . Penicillins Itching and Rash    Has patient had a PCN reaction causing immediate rash, facial/tongue/throat swelling, SOB or lightheadedness with hypotension: Unknown Has patient had a PCN reaction causing severe rash  involving mucus membranes or skin necrosis: Unknown Has patient had a PCN reaction that required hospitalization: No Has patient had a PCN reaction occurring within the last 10 years: No If all of the above answers are "NO", then may proceed with Cephalosporin use.      Current Outpatient Prescriptions:  .  albuterol (PROVENTIL HFA;VENTOLIN HFA) 108 (90 Base) MCG/ACT inhaler, Inhale 2 puffs into the lungs every 4 (four) hours as needed for wheezing or shortness of breath., Disp: 1 Inhaler, Rfl: 11 .  ALPRAZolam (XANAX) 0.5 MG tablet, Take 1 tablet (0.5 mg total) by mouth at bedtime as needed for anxiety. (Patient taking differently: Take 0.5 mg by mouth as needed for anxiety. ), Disp: 30 tablet, Rfl: 5 .  anastrozole (ARIMIDEX) 1 MG tablet, Take 1 tablet (1 mg total) by mouth daily., Disp: 30 tablet, Rfl: 3 .  fexofenadine (ALLEGRA) 180 MG tablet, Take 180 mg by mouth 3 (three) times a week. PRN, Disp: , Rfl:  .  fluticasone (FLONASE) 50 MCG/ACT nasal spray, Place 2 sprays into both nostrils daily. (Patient taking differently: Place 2 sprays into both nostrils 2 (two) times daily as needed for allergies. ), Disp: 16 g, Rfl: 5 .  Multiple Vitamin (MULTIVITAMIN) capsule, Take 1 capsule by mouth daily., Disp: , Rfl:  .  pantoprazole (PROTONIX) 40 MG tablet, Take 1 tablet (40 mg total) by mouth 2 (two) times daily before a meal., Disp:  180 tablet, Rfl: 1 .  polyethylene glycol (MIRALAX / GLYCOLAX) packet, Take 17 g by mouth daily as needed for mild constipation., Disp: , Rfl:  .  vitamin B-12 (CYANOCOBALAMIN) 1000 MCG tablet, Take 1,000 mcg by mouth daily., Disp: , Rfl:   Review of Systems  Constitutional: Positive for chills and fatigue.  HENT: Positive for congestion, ear pain, rhinorrhea, sinus pain, sinus pressure and sneezing.   Respiratory: Negative.   Cardiovascular: Negative.   Gastrointestinal: Positive for nausea (off and on since breast surgery).  Musculoskeletal: Positive for  arthralgias and back pain.  Neurological: Positive for dizziness.    Social History  Substance Use Topics  . Smoking status: Never Smoker  . Smokeless tobacco: Never Used  . Alcohol use No   Objective:   BP 128/74   Pulse 80   Temp 98.3 F (36.8 C)   Resp 16   Wt 166 lb 9.6 oz (75.6 kg)   SpO2 98%   BMI 29.51 kg/m  Vitals:   12/12/16 1114  BP: 128/74  Pulse: 80  Resp: 16  Temp: 98.3 F (36.8 C)  SpO2: 98%  Weight: 166 lb 9.6 oz (75.6 kg)     Physical Exam  Constitutional: She appears well-developed and well-nourished. No distress.  HENT:  Head: Normocephalic and atraumatic.  Right Ear: Hearing, external ear and ear canal normal. Tympanic membrane is bulging. Tympanic membrane is not perforated and not erythematous. A middle ear effusion is present.  Left Ear: Hearing, external ear and ear canal normal. Tympanic membrane is bulging. Tympanic membrane is not perforated and not erythematous. A middle ear effusion is present.  Nose: Mucosal edema present. No rhinorrhea. Right sinus exhibits maxillary sinus tenderness and frontal sinus tenderness. Left sinus exhibits maxillary sinus tenderness and frontal sinus tenderness.  Mouth/Throat: Uvula is midline, oropharynx is clear and moist and mucous membranes are normal. No oropharyngeal exudate, posterior oropharyngeal edema or posterior oropharyngeal erythema.  Neck: Normal range of motion. Neck supple. No tracheal deviation present. No thyromegaly present.  Cardiovascular: Normal rate, regular rhythm and normal heart sounds.  Exam reveals no gallop and no friction rub.   No murmur heard. Pulmonary/Chest: Effort normal and breath sounds normal. No stridor. No respiratory distress. She has no wheezes. She has no rales.  Lymphadenopathy:    She has no cervical adenopathy.  Skin: She is not diaphoretic.  Vitals reviewed.      Assessment & Plan:     1. Acute pansinusitis, recurrence not specified Worsening symptoms that  have not responded to OTC medications. Will give levaquin and prednisone as below. Levaquin given due to patient allergies to other antibiotics. Continue allergy medications. Stay well hydrated and get plenty of rest. Call if no symptom improvement or if symptoms worsen. - levofloxacin (LEVAQUIN) 500 MG tablet; Take 1 tablet (500 mg total) by mouth daily.  Dispense: 10 tablet; Refill: 0 - predniSONE (STERAPRED UNI-PAK 21 TAB) 10 MG (21) TBPK tablet; Take as directed on package instructions; 6 day taper  Dispense: 21 tablet; Refill: 0  2. Primary insomnia Worsening due to external stressors. Will start trazodone as below. Patient advised to not take alprazolam until she knows how trazodone effects her. She voices understanding. She is to call back in 2 weeks to let me know if dose is appropriate and let us know how she is sleeping.  - traZODone (DESYREL) 50 MG tablet; Take 0.5-1 tablets (25-50 mg total) by mouth at bedtime as needed for sleep.  Dispense: 30 tablet;  Refill: Norman, PA-C  [facility]

## 2016-12-12 NOTE — Patient Instructions (Signed)
The patient is aware to call back for any questions or concerns.  

## 2016-12-13 ENCOUNTER — Ambulatory Visit: Payer: Medicare Other

## 2016-12-14 ENCOUNTER — Ambulatory Visit: Payer: Medicare Other

## 2016-12-17 ENCOUNTER — Telehealth: Payer: Self-pay | Admitting: Physician Assistant

## 2016-12-17 DIAGNOSIS — B379 Candidiasis, unspecified: Secondary | ICD-10-CM

## 2016-12-17 DIAGNOSIS — T3695XA Adverse effect of unspecified systemic antibiotic, initial encounter: Principal | ICD-10-CM

## 2016-12-17 MED ORDER — FLUCONAZOLE 150 MG PO TABS: 150.0000 mg | ORAL_TABLET | Freq: Once | ORAL | 0 refills | Status: AC

## 2016-12-17 NOTE — Telephone Encounter (Signed)
Patient advised as directed below.  Thanks,  -Joseline 

## 2016-12-17 NOTE — Telephone Encounter (Signed)
Sent diflucan for yeast infection to [facility]

## 2016-12-17 NOTE — Telephone Encounter (Signed)
Pt states she was seen last week for a sinus infection .  Pt states she is now having burning and itching in her vaginal area.  Pt is also having frequency.  [facility].  CB#[phone]/MW

## 2016-12-19 ENCOUNTER — Other Ambulatory Visit: Payer: Self-pay | Admitting: *Deleted

## 2016-12-19 NOTE — Patient Outreach (Signed)
Received a call from pt wanting to cancel tomorrow's home visit, reports brother she has been caring for in Hospice, declining.   HIPAA identifiers (name, date of birth) provided by pt, requested RN CM to call next week (reschedule visit).    Plan:  RN CM to follow up with pt next week telephonically- reschedule home visit.    [name].   [place] [facility]  [phone]

## 2016-12-20 ENCOUNTER — Ambulatory Visit: Payer: Self-pay | Admitting: *Deleted

## 2016-12-25 ENCOUNTER — Other Ambulatory Visit: Payer: Self-pay | Admitting: *Deleted

## 2016-12-25 NOTE — Patient Outreach (Signed)
Successful telephone encounter to Norma Johns, 76 year old female, follow up call to schedule home visit.  Spoke with pt, did not provide HIPAA,  requested RN CM to call later, brother just passed.    Plan:  As requested, plan to follow up with pt next week telephonically- check on clinical status, schedule home visit.    [name].   [place] [facility]  [phone]

## 2016-12-25 NOTE — Patient Outreach (Signed)
Unsuccessful telephone encounter to Norma Johns, 76 year old female- follow up to reschedule home visit as pt cancelled last week's visit.   This RN CM following pt with Community CM services.   Unable to leave pt a voice message as message on phone- voice message not set up yet, try call later.   Plan:  RN CM to call pt again within the next 2 days.    [name].   [place] [facility]  [phone]

## 2016-12-26 ENCOUNTER — Encounter: Payer: Self-pay | Admitting: General Surgery

## 2016-12-26 ENCOUNTER — Ambulatory Visit (INDEPENDENT_AMBULATORY_CARE_PROVIDER_SITE_OTHER): Payer: Medicare Other | Admitting: General Surgery

## 2016-12-26 VITALS — BP 168/82 | HR 66 | Resp 12 | Ht 63.0 in | Wt 163.0 lb

## 2016-12-26 DIAGNOSIS — N6489 Other specified disorders of breast: Secondary | ICD-10-CM

## 2016-12-26 NOTE — Patient Instructions (Signed)
The patient is aware to call back for any questions or concerns.  

## 2016-12-26 NOTE — Progress Notes (Signed)
Patient ID: Norma Johns, female   DOB: Aug 18, 1940, 76 y.o.   MRN: [id number]  Chief Complaint  Patient presents with  . Follow-up    HPI Norma Johns is a 76 y.o. female.  female here today for her follow up bilateral seroma. Patient states she feels tight "like she has fluid back". She has been doing post mastectomy exercises.  She is exhausted from caring for her brother who passed away on 06-04-2022. Her husband has been in the hospital with CHF.  Ejection 12% on admission, 20% on discharge.  He reports he is no longer verbalizing much, and has stopped eating.  She thinks she is given up.  HPI  Past Medical History:  Diagnosis Date  . Anemia   . Anxiety   . Arthritis   . Asthma    WELL CONTROLLED  . Breast cancer ([place]) 2018   left breast cnacer  . Cancer ([place])    Bilat Mastectomy  . Depression   . GERD (gastroesophageal reflux disease)   . Headache    H/O  . Hyperlipidemia   . Sleep apnea    DOES NOT USE CPAP-COULD NOT TOLERATE  . Stroke [facility]) 2004    Past Surgical History:  Procedure Laterality Date  . ABDOMINAL HYSTERECTOMY    . BLADDER REPAIR    . BREAST BIOPSY Right 03/23/1991   Fibroadenoma with intramammary lymph node. Right breast, 9:00.  Marland Kitchen CHOLECYSTECTOMY    . COLONOSCOPY  2005  . MASTECTOMY MODIFIED RADICAL Left 10/01/2016   Procedure: MASTECTOMY MODIFIED RADICAL;  Surgeon: Robert Bellow, MD;  Location: [facility] ORS;  Service: General;  Laterality: Left;  . MR MRA CAROTID  02/2010   Minimal plaque formation; no significant stenosis. Sx: Syncope  . [facility]  . SENTINEL NODE BIOPSY Right 10/01/2016   Procedure: SENTINEL NODE BIOPSY;  Surgeon: Robert Bellow, MD;  Location: [facility] ORS;  Service: General;  Laterality: Right;  . SIMPLE MASTECTOMY WITH AXILLARY SENTINEL NODE BIOPSY Right 10/01/2016   Procedure: SIMPLE MASTECTOMY;  Surgeon: Robert Bellow, MD;  Location: [facility] ORS;  Service: General;  Laterality: Right;     Family History  Problem Relation Age of Onset  . Cancer Father   . Prostate cancer Father   . Heart attack Mother   . Hypertension Mother   . CAD Mother   . Hypertension Son   . Diabetes Son   . Lung cancer Brother   . Leukemia Brother   . Lung cancer Brother   . Prostate cancer Brother   . Breast cancer Neg Hx     Social History Social History  Substance Use Topics  . Smoking status: Never Smoker  . Smokeless tobacco: Never Used  . Alcohol use No    Allergies  Allergen Reactions  . Chocolate Shortness Of Breath  . Other Shortness Of Breath and Swelling    PT STATES ALLERGY TO "SOME TYPE OF DYE THAT WAS USED AT [facility]."  SWELLING TO ARM AND SOB.  PT CANNOT REMEMBER WHAT THE DYE WAS USED FOR.  . Sulfa Antibiotics Other (See Comments)    unknown  . Atorvastatin Other (See Comments)    Leg pain   . Minocycline Hcl Itching  . Naproxen Other (See Comments)    Indigestion  . Niacin Other (See Comments)    Felt like she was on fire  . Septra [Sulfamethoxazole-Trimethoprim] Other (See Comments)    unknown  . Vioxx [Rofecoxib]  Other (See Comments)    unknown  . Penicillins Itching and Rash    Has patient had a PCN reaction causing immediate rash, facial/tongue/throat swelling, SOB or lightheadedness with hypotension: Unknown Has patient had a PCN reaction causing severe rash involving mucus membranes or skin necrosis: Unknown Has patient had a PCN reaction that required hospitalization: No Has patient had a PCN reaction occurring within the last 10 years: No If all of the above answers are "NO", then may proceed with Cephalosporin use.     Current Outpatient Prescriptions  Medication Sig Dispense Refill  . albuterol (PROVENTIL HFA;VENTOLIN HFA) 108 (90 Base) MCG/ACT inhaler Inhale 2 puffs into the lungs every 4 (four) hours as needed for wheezing or shortness of breath. 1 Inhaler 11  . ALPRAZolam (XANAX) 0.5 MG tablet Take 1 tablet (0.5 mg total) by mouth  at bedtime as needed for anxiety. (Patient taking differently: Take 0.5 mg by mouth as needed for anxiety. ) 30 tablet 5  . anastrozole (ARIMIDEX) 1 MG tablet Take 1 tablet (1 mg total) by mouth daily. 30 tablet 3  . fexofenadine (ALLEGRA) 180 MG tablet Take 180 mg by mouth 3 (three) times a week. PRN    . fluticasone (FLONASE) 50 MCG/ACT nasal spray Place 2 sprays into both nostrils daily. (Patient taking differently: Place 2 sprays into both nostrils 2 (two) times daily as needed for allergies. ) 16 g 5  . levofloxacin (LEVAQUIN) 500 MG tablet Take 1 tablet (500 mg total) by mouth daily. 10 tablet 0  . Multiple Vitamin (MULTIVITAMIN) capsule Take 1 capsule by mouth daily.    . pantoprazole (PROTONIX) 40 MG tablet Take 1 tablet (40 mg total) by mouth 2 (two) times daily before a meal. 180 tablet 1  . polyethylene glycol (MIRALAX / GLYCOLAX) packet Take 17 g by mouth daily as needed for mild constipation.    . traZODone (DESYREL) 50 MG tablet Take 0.5-1 tablets (25-50 mg total) by mouth at bedtime as needed for sleep. 30 tablet 3  . vitamin B-12 (CYANOCOBALAMIN) 1000 MCG tablet Take 1,000 mcg by mouth daily.     No current facility-administered medications for this visit.     Review of Systems Review of Systems  Constitutional: Negative.   Respiratory: Negative.   Cardiovascular: Negative.     Blood pressure (!) 168/82, pulse 66, resp. rate 12, height 5\' 3"  (1.6 m), weight 163 lb (73.9 kg).  Physical Exam Physical Exam  Constitutional: She is oriented to person, place, and time. She appears well-developed and well-nourished.  Pulmonary/Chest: Effort normal and breath sounds normal.    Bilateral mastectomy incision clean. Aspiration of seroma   Neurological: She is alert and oriented to person, place, and time.  Skin: Skin is warm and dry.  Psychiatric: Her behavior is normal.   Upper extremity measurements obtained 15 cm above as well as 10 and 20 cm below the olecranon  process.  Right: 33, 25.5, 21 cm.  Left: 34.5, 28, 22 cm.  November 22, 2016 measurements:  Right: 32.5, 28, 22 cm.  Left: 35, 29, 22 cm.  No significant interval change.  Data Reviewed Limited ultrasound was completed showing 2 small fluid collections in the mastectomy sites, after ChloraPrep 12 cc aspirated from the right side, 15 cc aspirated from the left.  No images, no charge  Assessment    Small residual seromas.    Plan         Follow up in one month.   HPI, Physical Exam,  Assessment and Plan have been scribed under the direction and in the presence of Robert Bellow, MD. Karie Fetch, RN  Robert Bellow 12/26/2016, 10:42 AM

## 2016-12-28 ENCOUNTER — Telehealth: Payer: Self-pay | Admitting: Physician Assistant

## 2016-12-28 DIAGNOSIS — R350 Frequency of micturition: Secondary | ICD-10-CM

## 2016-12-28 MED ORDER — NITROFURANTOIN MONOHYD MACRO 100 MG PO CAPS: 100.0000 mg | ORAL_CAPSULE | Freq: Two times a day (BID) | ORAL | 0 refills | Status: DC

## 2016-12-28 NOTE — Telephone Encounter (Signed)
Will send macrobid. If symptoms do not improve patient will need to be seen so we can test her urine and send for culture to make sure not resistance to antibiotic.

## 2016-12-28 NOTE — Telephone Encounter (Signed)
Pt states she is having a lot of pressure and frequency when voiding.  Pt states she can not come and is requesting a Rx to help with this.  [facility].  CB#[phone]/MW

## 2016-12-28 NOTE — Telephone Encounter (Signed)
Please Review.  Thanks,  -Remingtyn Depaola 

## 2016-12-28 NOTE — Telephone Encounter (Signed)
Pt advised-Zakai Gonyea V Loras Grieshop, RMA  

## 2016-12-31 ENCOUNTER — Other Ambulatory Visit: Payer: Self-pay | Admitting: *Deleted

## 2016-12-31 NOTE — Patient Outreach (Signed)
Successful telephone encounter to Norma Johns, 76 year old female, check on status, schedule home visit.  Spoke with pt, HIPAA identifiers verified.  Pt reports dealing with recent loss of her brother, still has weakness,tired, worn out.  Pt reports try and start to do better at resting, to get an infusion in 2 days.   Pt reports nutrition still not good, continues to take Boost.   RN CM discussed with pt scheduling monthly home visit (last month pt cancelled due to brother) to which pt agreed.   Plan:  As discussed with pt, plan to follow up again next week- home visit.   [name].   [place] [facility]  [phone]

## 2017-01-02 ENCOUNTER — Inpatient Hospital Stay: Payer: Medicare Other

## 2017-01-02 ENCOUNTER — Inpatient Hospital Stay: Payer: Medicare Other | Admitting: Oncology

## 2017-01-03 ENCOUNTER — Other Ambulatory Visit: Payer: Self-pay | Admitting: Physician Assistant

## 2017-01-03 DIAGNOSIS — K219 Gastro-esophageal reflux disease without esophagitis: Secondary | ICD-10-CM

## 2017-01-03 MED ORDER — PANTOPRAZOLE SODIUM 40 MG PO TBEC: 40.0000 mg | DELAYED_RELEASE_TABLET | Freq: Two times a day (BID) | ORAL | 1 refills | Status: DC

## 2017-01-03 NOTE — Telephone Encounter (Signed)
Pt contacted office for refill request on the following medications:  pantoprazole (PROTONIX) 40 MG tablet  [facility].  CB#[phone]/MW

## 2017-01-08 ENCOUNTER — Inpatient Hospital Stay: Payer: Medicare Other | Attending: Oncology

## 2017-01-08 ENCOUNTER — Other Ambulatory Visit: Payer: Self-pay | Admitting: *Deleted

## 2017-01-08 DIAGNOSIS — M129 Arthropathy, unspecified: Secondary | ICD-10-CM | POA: Insufficient documentation

## 2017-01-08 DIAGNOSIS — J45909 Unspecified asthma, uncomplicated: Secondary | ICD-10-CM | POA: Diagnosis not present

## 2017-01-08 DIAGNOSIS — Z79899 Other long term (current) drug therapy: Secondary | ICD-10-CM | POA: Diagnosis not present

## 2017-01-08 DIAGNOSIS — Z79811 Long term (current) use of aromatase inhibitors: Secondary | ICD-10-CM | POA: Diagnosis not present

## 2017-01-08 DIAGNOSIS — R232 Flushing: Secondary | ICD-10-CM | POA: Diagnosis not present

## 2017-01-08 DIAGNOSIS — Z806 Family history of leukemia: Secondary | ICD-10-CM | POA: Diagnosis not present

## 2017-01-08 DIAGNOSIS — G4730 Sleep apnea, unspecified: Secondary | ICD-10-CM | POA: Insufficient documentation

## 2017-01-08 DIAGNOSIS — F329 Major depressive disorder, single episode, unspecified: Secondary | ICD-10-CM | POA: Insufficient documentation

## 2017-01-08 DIAGNOSIS — C50512 Malignant neoplasm of lower-outer quadrant of left female breast: Secondary | ICD-10-CM

## 2017-01-08 DIAGNOSIS — E785 Hyperlipidemia, unspecified: Secondary | ICD-10-CM | POA: Insufficient documentation

## 2017-01-08 DIAGNOSIS — K219 Gastro-esophageal reflux disease without esophagitis: Secondary | ICD-10-CM | POA: Diagnosis not present

## 2017-01-08 DIAGNOSIS — Z170 Estrogen receptor positive status [ER+]: Secondary | ICD-10-CM

## 2017-01-08 DIAGNOSIS — Z801 Family history of malignant neoplasm of trachea, bronchus and lung: Secondary | ICD-10-CM | POA: Diagnosis not present

## 2017-01-08 DIAGNOSIS — Z8673 Personal history of transient ischemic attack (TIA), and cerebral infarction without residual deficits: Secondary | ICD-10-CM | POA: Diagnosis not present

## 2017-01-08 DIAGNOSIS — Z9013 Acquired absence of bilateral breasts and nipples: Secondary | ICD-10-CM | POA: Diagnosis not present

## 2017-01-08 DIAGNOSIS — M8580 Other specified disorders of bone density and structure, unspecified site: Secondary | ICD-10-CM | POA: Diagnosis not present

## 2017-01-08 DIAGNOSIS — Z8042 Family history of malignant neoplasm of prostate: Secondary | ICD-10-CM | POA: Insufficient documentation

## 2017-01-08 LAB — BASIC METABOLIC PANEL
Anion gap: 6 (ref 5–15)
BUN: 10 mg/dL (ref 6–20)
CALCIUM: 9 mg/dL (ref 8.9–10.3)
CO2: 25 mmol/L (ref 22–32)
CREATININE: 0.58 mg/dL (ref 0.44–1.00)
Chloride: 106 mmol/L (ref 101–111)
GFR calc Af Amer: >60 mL/min (ref >60)
GLUCOSE: 134 mg/dL — AB (ref 65–99)
POTASSIUM: 3.8 mmol/L (ref 3.5–5.1)
SODIUM: 137 mmol/L (ref 135–145)

## 2017-01-08 NOTE — Patient Outreach (Signed)
[facility]) Care Management   01/08/2017  Norma Johns February 26, 1941 [id number]  Norma Johns is an 76 y.o. female  Subjective:  Pt reports on appetite/nutrition, forces herself to eat because she needs to,  Continues with Boost every other day, strength getting a little better.  Pt reports she is  Still grieving the loss of her brother/not sleeping, PCP did prescribe her Trazodone, was  Taking it qod- works, knows she needs sleep for her recovery.  Pt reports on recent PCP  visit- good report, saw cancer surgeon to have fluid drawn off where she had surgery  Bilateral Mastectomies).  Pt reports to see Dr. Tasia Catchings tomorrow for infusion.  Pt reports after  Surgery developed a mid abdominal fat roll, causing her discomfort, going to talk to  PCP about it as surgeon told it was fat.   Pt reports she has no feeling in both axilla since Surgery.    Objective:   Vitals:   01/08/17 1422  BP: 140/78  Pulse: 65  Resp: 16  SpO2: 98%   ROS  Physical Exam  Constitutional: She is oriented to person, place, and time. She appears well-developed and well-nourished.  Cardiovascular: Normal rate and regular rhythm.  Respiratory: Effort normal and breath sounds normal.  GI: Soft. Bowel sounds are normal.  Musculoskeletal: Normal range of motion. She exhibits no edema.  Neurological: She is alert and oriented to person, place, and time.  Skin: Skin is warm and dry.  Psychiatric: She has a normal mood and affect. Her behavior is normal. Judgment and thought content normal.    Encounter Medications:   Outpatient Encounter Medications as of 01/08/2017  Medication Sig Note  . albuterol (PROVENTIL HFA;VENTOLIN HFA) 108 (90 Base) MCG/ACT inhaler Inhale 2 puffs into the lungs every 4 (four) hours as needed for wheezing or shortness of breath. 01/08/2017: As needed.   Marland Kitchen anastrozole (ARIMIDEX) 1 MG tablet Take 1 tablet (1 mg total) by mouth daily.   . fexofenadine (ALLEGRA) 180 MG  tablet Take 180 mg by mouth 3 (three) times a week. PRN   . fluticasone (FLONASE) 50 MCG/ACT nasal spray Place 2 sprays into both nostrils daily. (Patient taking differently: Place 2 sprays into both nostrils 2 (two) times daily as needed for allergies. ) 01/08/2017: As needed.   . Multiple Vitamin (MULTIVITAMIN) capsule Take 1 capsule by mouth daily.   . pantoprazole (PROTONIX) 40 MG tablet Take 1 tablet (40 mg total) 2 (two) times daily before a meal by mouth.   . traZODone (DESYREL) 50 MG tablet Take 0.5-1 tablets (25-50 mg total) by mouth at bedtime as needed for sleep. 01/08/2017: As needed.   . vitamin B-12 (CYANOCOBALAMIN) 1000 MCG tablet Take 1,000 mcg by mouth daily.   Marland Kitchen ALPRAZolam (XANAX) 0.5 MG tablet Take 1 tablet (0.5 mg total) by mouth at bedtime as needed for anxiety. (Patient not taking: Reported on 01/08/2017) 11/19/2016: As needed.   Marland Kitchen levofloxacin (LEVAQUIN) 500 MG tablet Take 1 tablet (500 mg total) by mouth daily. (Patient not taking: Reported on 01/08/2017)   . nitrofurantoin, macrocrystal-monohydrate, (MACROBID) 100 MG capsule Take 1 capsule (100 mg total) by mouth 2 (two) times daily. (Patient not taking: Reported on 01/08/2017)   . polyethylene glycol (MIRALAX / GLYCOLAX) packet Take 17 g by mouth daily as needed for mild constipation. 11/19/2016: Available if needed.    No facility-administered encounter medications on file as of 01/08/2017.     Functional Status:   In your present  state of health, do you have any difficulty performing the following activities: 11/19/2016 10/01/2016  Hearing? N N  Vision? N N  Difficulty concentrating or making decisions? Y N  Walking or climbing stairs? Y N  Comment - -  Dressing or bathing? N N  Doing errands, shopping? N N  Preparing Food and eating ? N -  Using the Toilet? N -  In the past six months, have you accidently leaked urine? Y -  Do you have problems with loss of bowel control? N -  Managing your Medications? N -   Managing your Finances? N -  Housekeeping or managing your Housekeeping? N -  Some recent data might be hidden    Fall/Depression Screening:    Fall Risk  10/30/2016 10/25/2016 06/12/2016  Falls in the past year? No No No  Risk for fall due to : Impaired balance/gait Impaired balance/gait -   PHQ 2/9 Scores 10/30/2016 10/25/2016 06/12/2016 04/05/2016 01/18/2015 09/06/2014  PHQ - 2 Score 1 1 0 0 0 0  PHQ- 9 Score - - 8 - - -    Assessment: Pleasant 76 year old female, resides with spouse.  Referral received from [facility] telephonic RN CM 10/26/16 for Community CM services, pt had Bilateral mastectomy 10/01/16.  Pt's history includes  But not limited to GERD,benign hypertension, Diabetes,CVA, breast cancer.     Nutrition/weakness:  Per pt getting little better.  Encouraged pt to have small frequent meals/include        Protein, ongoing use of Boost.    Discomfort in mid abdominal fat fold: per pt +5 today, has to sleep on back, currently not taking any        Pain medication.  Per pt, to talk to PCP about origin of the fat fold.   Plan:  As discussed with pt, plan to follow up again next month telephonically- check on clinical         Status.    THN CM Care Plan Problem One     Most Recent Value  Care Plan Problem One  Recent bilateral mastectomies - weakness  Role Documenting the Problem One  Care Management Coordinator  Care Plan for Problem One  Active  THN Long Term Goal   Re established weakness would improve within the next 40 days   THN Long Term Goal Start Date  12/31/16  Interventions for Problem One Long Term Goal  Reinforced with pt to increase use of Trazadone at night for sleep   [facility]N CM Short Term Goal #1   Nutrition would continue to improve in the next 30 days   [facility] CM Short Term Goal #1 Start Date  01/08/17  Interventions for Short Term Goal #1  Encouraged pt to have small frequent meals during the day/ include protein.       [name].   [place] [facility]   [phone]

## 2017-01-09 ENCOUNTER — Other Ambulatory Visit: Payer: Self-pay

## 2017-01-09 ENCOUNTER — Telehealth: Payer: Self-pay | Admitting: Oncology

## 2017-01-09 ENCOUNTER — Inpatient Hospital Stay (HOSPITAL_BASED_OUTPATIENT_CLINIC_OR_DEPARTMENT_OTHER): Payer: Medicare Other | Admitting: Oncology

## 2017-01-09 ENCOUNTER — Encounter: Payer: Self-pay | Admitting: Oncology

## 2017-01-09 ENCOUNTER — Inpatient Hospital Stay: Payer: Medicare Other

## 2017-01-09 VITALS — BP 135/88 | HR 87 | Temp 97.6°F | Resp 18 | Wt 166.9 lb

## 2017-01-09 DIAGNOSIS — Z806 Family history of leukemia: Secondary | ICD-10-CM | POA: Diagnosis not present

## 2017-01-09 DIAGNOSIS — M129 Arthropathy, unspecified: Secondary | ICD-10-CM

## 2017-01-09 DIAGNOSIS — M8580 Other specified disorders of bone density and structure, unspecified site: Secondary | ICD-10-CM | POA: Diagnosis not present

## 2017-01-09 DIAGNOSIS — C50512 Malignant neoplasm of lower-outer quadrant of left female breast: Secondary | ICD-10-CM | POA: Diagnosis not present

## 2017-01-09 DIAGNOSIS — Z79899 Other long term (current) drug therapy: Secondary | ICD-10-CM

## 2017-01-09 DIAGNOSIS — J45909 Unspecified asthma, uncomplicated: Secondary | ICD-10-CM

## 2017-01-09 DIAGNOSIS — G4730 Sleep apnea, unspecified: Secondary | ICD-10-CM

## 2017-01-09 DIAGNOSIS — Z8042 Family history of malignant neoplasm of prostate: Secondary | ICD-10-CM | POA: Diagnosis not present

## 2017-01-09 DIAGNOSIS — R232 Flushing: Secondary | ICD-10-CM | POA: Diagnosis not present

## 2017-01-09 DIAGNOSIS — Z8673 Personal history of transient ischemic attack (TIA), and cerebral infarction without residual deficits: Secondary | ICD-10-CM

## 2017-01-09 DIAGNOSIS — E785 Hyperlipidemia, unspecified: Secondary | ICD-10-CM

## 2017-01-09 DIAGNOSIS — D0502 Lobular carcinoma in situ of left breast: Secondary | ICD-10-CM

## 2017-01-09 DIAGNOSIS — Z79811 Long term (current) use of aromatase inhibitors: Secondary | ICD-10-CM | POA: Diagnosis not present

## 2017-01-09 DIAGNOSIS — Z170 Estrogen receptor positive status [ER+]: Principal | ICD-10-CM

## 2017-01-09 DIAGNOSIS — Z9013 Acquired absence of bilateral breasts and nipples: Secondary | ICD-10-CM | POA: Diagnosis not present

## 2017-01-09 DIAGNOSIS — M85852 Other specified disorders of bone density and structure, left thigh: Secondary | ICD-10-CM

## 2017-01-09 DIAGNOSIS — K219 Gastro-esophageal reflux disease without esophagitis: Secondary | ICD-10-CM | POA: Diagnosis not present

## 2017-01-09 DIAGNOSIS — F329 Major depressive disorder, single episode, unspecified: Secondary | ICD-10-CM | POA: Diagnosis not present

## 2017-01-09 DIAGNOSIS — Z801 Family history of malignant neoplasm of trachea, bronchus and lung: Secondary | ICD-10-CM | POA: Diagnosis not present

## 2017-01-09 MED ORDER — ZOLEDRONIC ACID 4 MG/100ML IV SOLN
4.0000 mg | Freq: Once | INTRAVENOUS | Status: AC
  Administered 2017-01-09: 4 mg via INTRAVENOUS
  Filled 2017-01-09: qty 100

## 2017-01-09 MED ORDER — SODIUM CHLORIDE 0.9 % IV SOLN
Freq: Once | INTRAVENOUS | Status: AC
  Administered 2017-01-09: 15:00:00 via INTRAVENOUS
  Filled 2017-01-09: qty 1000

## 2017-01-09 NOTE — Progress Notes (Signed)
Hematology/Oncology Follow Up Note [facility] [phone] [phone]   Patient Care Team: Rubye Beach as PCP - General (Family Medicine) Bary Castilla Forest Gleason, MD (General Surgery) Rubye Beach as Physician Assistant (Family Medicine) Lyman Speller, RN as [place] [facility]  REASON FOR VISIT Follow up for treatment of breast cancer.  HISTORY OF PRESENTING ILLNESS/PERTINENT ONCOLOGY HISTORY Norma Johns is a 76 y.o.afemale who has above oncology history reviewed by me today presented for follow up visit for management of   evaluation and management of breast cancer.   Patient went for routine mammogram screening in May. Abnormalities was found on the left breast. Patient was called back to have diagnostic mammogram done on 08/08/2016 which showed left breast mass at 4:00 3 cm from nipple and a 4:00 6 cm of the nipple. Indeterminate left axillary lymph nodes.  Patient underwent biopsy on 08/15/2016. Pathology showed invasive lobular carcinoma for both breast lesion, . DCIS present, lymph node biopsy is positive for metastatic lobular carcinoma  MRI of the breast on 09/10/2016 showed the having small no mass area of progressive enhancement, left breast 2 biopsy proven malignancy seen in the left breast were separated by 3.4 cm band of weakly enhancing tissue. An area of stippled discontinuous no masses enhancement extends superiorly in the left breast in 2 the upper outer quadrant spanning approximately 3.2 cm anterior/superior to the more anterior of the 2 biopsy proven malignancy. Recommend MRI guided core biopsy of the treated area of non-mass enhancement. Patient was seen and evaluated by Dr. Tollie Pizza and position was left modified radical mastectomy, right simple mastectomy with sentinel node biopsy. Patient underwent surgery on 10/01/2016. SHe tolerated the procedure well.  She declined  systemic chemotherapy or radiation therapy. She was started on Arimidex '1mg'$  daily since September. Denies any hotflush or joint pain.  Her wound has healed well and she follows up with surgeon for seroma. She does not complaints about pain. She noticed a small area of fluid collection.   INTERVAL HISTORY Report reports feeling well. He follows up with Dr. Tollie Pizza for seroma. Reports weight gain and the increased fatty tissue on her abdominal wall.Denies fever or chills, shortness of breath, chest pain, abdominal pain.  She take single agent arimidex daily. Had some hot flashes but overall tolerating it very well. ROS:  Review of Systems  Constitutional: Negative for appetite change.  HENT:   Negative for hearing loss.   Eyes: Negative for eye problems.  Respiratory: Negative for chest tightness.   Cardiovascular: Negative for chest pain.  Gastrointestinal: Negative for abdominal distention.  Endocrine: Negative for hot flashes.  Genitourinary: Negative.  Negative for difficulty urinating.   Musculoskeletal: Negative for arthralgias.       Chronic back pain  Skin: Negative.  Negative for itching.  Neurological: Negative.  Negative for dizziness.  Hematological: Negative.  Negative for adenopathy.  Psychiatric/Behavioral: The patient is nervous/anxious.     MEDICAL HISTORY:  Past Medical History:  Diagnosis Date  . Anemia   . Anxiety   . Arthritis   . Asthma    WELL CONTROLLED  . Breast cancer ([place]) 2018   left breast cnacer  . Cancer ([place])    Bilat Mastectomy  . Depression   . GERD (gastroesophageal reflux disease)   . Headache    H/O  . Hyperlipidemia   . Sleep apnea    DOES NOT USE CPAP-COULD NOT TOLERATE  . Stroke [facility]) 2004  SURGICAL HISTORY: Past Surgical History:  Procedure Laterality Date  . ABDOMINAL HYSTERECTOMY    . BLADDER REPAIR    . BREAST BIOPSY Right 03/23/1991   Fibroadenoma with intramammary lymph node. Right breast, 9:00.  Marland Kitchen CHOLECYSTECTOMY      . COLONOSCOPY  2005  . MR MRA CAROTID  02/2010   Minimal plaque formation; no significant stenosis. Sx: Syncope  . NASAL SINUS SURGERY     [facility]    SOCIAL HISTORY: Social History   Socioeconomic History  . Marital status: Married    Spouse name: Not on file  . Number of children: 2  . Years of education: Not on file  . Highest education level: Not on file  Social Needs  . Financial resource strain: Not on file  . Food insecurity - worry: Not on file  . Food insecurity - inability: Not on file  . Transportation needs - medical: Not on file  . Transportation needs - non-medical: Not on file  Occupational History  . Not on file  Tobacco Use  . Smoking status: Never Smoker  . Smokeless tobacco: Never Used  Substance and Sexual Activity  . Alcohol use: No  . Drug use: No  . Sexual activity: No  Other Topics Concern  . Not on file  Social History Narrative  . Not on file    FAMILY HISTORY: Family History  Problem Relation Age of Onset  . Cancer Father   . Prostate cancer Father   . Heart attack Mother   . Hypertension Mother   . CAD Mother   . Hypertension Son   . Diabetes Son   . Lung cancer Brother   . Leukemia Brother   . Lung cancer Brother   . Prostate cancer Brother   . Breast cancer Neg Hx     ALLERGIES:  is allergic to chocolate; other; sulfa antibiotics; atorvastatin; minocycline hcl; naproxen; niacin; septra [sulfamethoxazole-trimethoprim]; vioxx [rofecoxib]; and penicillins.  MEDICATIONS:  Current Outpatient Medications  Medication Sig Dispense Refill  . albuterol (PROVENTIL HFA;VENTOLIN HFA) 108 (90 Base) MCG/ACT inhaler Inhale 2 puffs into the lungs every 4 (four) hours as needed for wheezing or shortness of breath. 1 Inhaler 11  . ALPRAZolam (XANAX) 0.5 MG tablet Take 1 tablet (0.5 mg total) by mouth at bedtime as needed for anxiety. (Patient not taking: Reported on 01/08/2017) 30 tablet 5  . anastrozole (ARIMIDEX) 1 MG tablet Take 1  tablet (1 mg total) by mouth daily. 30 tablet 3  . fexofenadine (ALLEGRA) 180 MG tablet Take 180 mg by mouth 3 (three) times a week. PRN    . fluticasone (FLONASE) 50 MCG/ACT nasal spray Place 2 sprays into both nostrils daily. (Patient taking differently: Place 2 sprays into both nostrils 2 (two) times daily as needed for allergies. ) 16 g 5  . levofloxacin (LEVAQUIN) 500 MG tablet Take 1 tablet (500 mg total) by mouth daily. (Patient not taking: Reported on 01/08/2017) 10 tablet 0  . Multiple Vitamin (MULTIVITAMIN) capsule Take 1 capsule by mouth daily.    . nitrofurantoin, macrocrystal-monohydrate, (MACROBID) 100 MG capsule Take 1 capsule (100 mg total) by mouth 2 (two) times daily. (Patient not taking: Reported on 01/08/2017) 14 capsule 0  . pantoprazole (PROTONIX) 40 MG tablet Take 1 tablet (40 mg total) 2 (two) times daily before a meal by mouth. 180 tablet 1  . polyethylene glycol (MIRALAX / GLYCOLAX) packet Take 17 g by mouth daily as needed for mild constipation.    . traZODone (  DESYREL) 50 MG tablet Take 0.5-1 tablets (25-50 mg total) by mouth at bedtime as needed for sleep. 30 tablet 3  . vitamin B-12 (CYANOCOBALAMIN) 1000 MCG tablet Take 1,000 mcg by mouth daily.     No current facility-administered medications for this visit.       Marland Kitchen  PHYSICAL EXAMINATION: ECOG PERFORMANCE STATUS: 0 - Asymptomatic There were no vitals filed for this visit. There were no vitals filed for this visit.  GENERAL:  Alert, no distress and comfortable.  EYES: no pallor or icterus OROPHARYNX: no thrush or ulceration; good dentition  NECK: supple, no masses felt LYMPH:  no palpable lymphadenopathy in the cervical, axillary or inguinal regions LUNGS: clear to auscultation and  No wheeze or crackles HEART/CVS: regular rate & rhythm and no murmurs; No lower extremity edema ABDOMEN: abdomen soft, non-tender and normal bowel sounds Musculoskeletal:no cyanosis of digits and no clubbing  PSYCH: alert &  oriented x 3  NEURO: no focal motor/sensory deficits SKIN:  no rashes or significant lesions Breast exam is performed in seated and lying down position. Patient is status post bilateral mastectomy.  Scars are well healed.  LABORATORY DATA:  I have reviewed the data as listed Lab Results  Component Value Date   WBC 7.1 10/22/2016   HGB 12.9 10/22/2016   HCT 38.2 10/22/2016   MCV 89.7 10/22/2016   PLT 310 10/22/2016   Recent Labs    04/17/16 1215 07/05/16 1239 10/22/16 1018 11/28/16 1101 01/08/17 1033  NA 136 139 136 136 137  K 3.7 3.9 4.5 4.1 3.8  CL 104 108 103 105 106  CO2 '27 27 28 26 25  '$ GLUCOSE 132* 124* 127* 183* 134*  BUN '16 12 11 14 10  '$ CREATININE 0.71 0.63 0.67 0.65 0.58  CALCIUM 9.8 9.7 9.7 9.7 9.0  GFRNONAA >60 >60 >60 >60 >60  GFRAA >60 >60 >60 >60 >60  PROT 7.4 7.0 6.6  --   --   ALBUMIN 4.6 4.1 4.0  --   --   AST '18 19 15  '$ --   --   ALT 13* 14 11*  --   --   ALKPHOS 72 74 76  --   --   BILITOT 0.4 1.3* 0.8  --   --     RADIOGRAPHIC STUDIES: I have personally reviewed the radiological images as listed and agreed with the findings in the report. No results found. Pathology 10/01/2016 SPECIMEN SUBMITTED:  A. Breast, left, and axillary contents  B. Breast, right; simple mastectomy  C. Sentinel node 1, right  DIAGNOSIS:  A. LEFT BREAST AND AXILLARY CONTENTS; MODIFIED RADICAL MASTECTOMY:  - INVASIVE CARCINOMA WITH DUCTAL AND LOBULAR FEATURES.  - LARGEST FOCUS OF INVASIVE CARCINOMA MEASURES 10 MM.  - NINETEEN OF NINETEEN LYMPH NODES POSITIVE FOR METASTASIS (19/19).  - BIOPSY SITE CHANGES, MARKER CLIP PRESENT.  - SURGICAL MARGINS ARE NEGATIVE.  B. RIGHT BREAST; SIMPLE MASTECTOMY:  - FIBROCYSTIC CHANGE.  - NEGATIVE FOR ATYPIA AND MALIGNANCY.  C. SENTINEL LYMPH NODE 1, RIGHT; EXCISION:  - NO TUMOR SEEN IN ONE LYMPH NODE (0/1).  Surgical Pathology Cancer Case Summary   INVASIVE CARCINOMA OF THEBREAST  Procedure: modified radical mastectomy   Specimen Laterality: left  Histologic Type: invasive carcinoma with ductal and lobular features  Histologic Grade (Nottingham Histologic Score)       Glandular (Acinar)/Tubular Differentiation: 3       Nuclear Pleomorphism: 1       Mitotic Rate: 1  Overall Grade: 1  Tumor Size: 10 mm  Ductal Carcinoma in Situ (DCIS): present       Nuclear Grade: 1       Extensive intraductal component: negative  Lymphovascular Invasion: present  Treatment Effect: not applicable  Margins:       Invasive carcinoma: margins negative       Distance from closest margin: 20 mm from deep margin       DCIS: margins negative         Distance from closest margin: 20 mm from deep margin  Regional Lymph nodes:    Total # lymph nodes examined: 19    # Sentinel lymph nodes examined: 0    # Lymph nodes with macrometastasis (>2.0 mm): 18    # Lymph nodes with isolated tumor cells (<0.108m): 0    # Lymph nodes with micrometastasis (> 0.2 mm and < 2.0 mm): 1    Extranodal extension: focally present  Pathologic Stage Classification (pTNM, AJCC 7th Edition)    TNM Descriptors: m (multiple foci of invasive carcinoma)    pTNM: mpT1b pN3a   Note: Two masses are identified. The 3rd grossly described nodule in the  upper outer quadrant is an intramammary node nearly replaced by tumor.  Biomarker testing was performed on a separate specimen and in summary:  Estrogen Receptor (ER) Status: POSITIVE, >90%    Average intensity of staining: Strong  Progesterone Receptor (PgR) Status: POSITIVE, >90%    Average intensity of staining: Moderate  HER2 (by immunohistochemistry): NEGATIVE (Score 1+)    Pathology 10/01/2016 SPECIMEN SUBMITTED:  A. Breast, left, 4:00, 3 CMFN, biopsy  B. Breast, left, 4:00, 6 CMFN, biopsy  C. Lymph node, left axilla, biopsy  DIAGNOSIS:  A. BREAST, LEFT, 4 O'CLOCK 3 CM FROM NIPPLE; ULTRASOUND-GUIDED  CORE  BIOPSY:  - INVASIVE LOBULAR CARCINOMA, CLASSIC TYPE.  Size of invasive carcinoma: 6 mm in this sample  Histologic grade of invasive carcinoma: Grade 1    Glandular/tubular differentiation score: 3    Nuclear pleomorphism score: 1    Mitotic rate score: 1    Total score: 5  Ductal carcinomain situ: Present, low grade  Lymphovascular invasion: Not identified  B. BREAST, LEFT, 4 O'CLOCK 6 CM FROM NIPPLE; ULTRASOUND-GUIDED CORE  BIOPSY:  - INVASIVE LOBULAR CARCINOMA, CLASSIC TYPE.  Size of invasive carcinoma: 6 mm in this sample  Histologic grade of invasive carcinoma: Grade 1    Glandular/tubular differentiation score: 3    Nuclear pleomorphism score: 1    Mitotic rate score: 1    Total score: 5  Ductal carcinoma in situ: Not identified  Lymphovascular invasion: Not identified  C. LYMPH NODE, LEFT AXILLA; ULTRASOUND-GUIDED CORE BIOPSY:  - METASTATIC LOBULAR CARCINOMA; TUMOR SPANS 7 MM.  - NO EXTRANODAL EXTENSION IN THIS SAMPLE.   ASSESSMENT & PLAN:  Cancer Staging Malignant neoplasm of lower-outer quadrant of left breast of female, estrogen receptor positive ([place] Staging form: Breast, AJCC 8th Edition - Clinical stage from 10/22/2016: Stage IIIB (cT1b(m), cN3a, cM0, G1, ER: Positive, PR: Positive, HER2: Negative) - Signed by YEarlie Server MD on 10/22/2016 - Pathologic stage from 10/22/2016: pT1b(m), pN3a, cM0, ER: Positive, PR: Positive, HER2: Negative - Signed by YEarlie Server MD on 10/22/2016  1. Malignant neoplasm of lower-outer quadrant of left breast of female, estrogen receptor positive ([place]   2. Lobular carcinoma in situ (LCIS) of left breast   3. Osteopenia of neck of left femur     76yo female with stage III  B, ER/PR positive HER-2 negative invasive carcinoma with both ductal and lobular features. She has 19 out of 19 axillary lymph nodes positive on the left. S/p lumpectomy and declined both chemotherapy and radiation, persistent elevated CA 27.29,  CT and bone scan negative for metastasis  Continue Arimidex 1 mg by mouth daily. Today patient is here for evaluation prior to monthly Zometa. Reports to have weight gain and increase of fatty tissue along her abdomen more. Discussed with patient that the waking can be a possible side effects from aromatase inhibitors. And I advised her to have healthy balanced diet and improve exercise. Continue Zometa monthly    All questions were answered. The patient knows to call the clinic with any problems questions or concerns.  Return of visit:  4-5 weeks for evaluation before third dose of zometa.     Earlie Server, MD, PhD Hematology Oncology [facility] Pager- [phone] 01/09/2017

## 2017-01-09 NOTE — Telephone Encounter (Signed)
Per Julie/Verbal, ok for patient to have labs drawn 1 day before follow up appt. (patient request)

## 2017-01-09 NOTE — Progress Notes (Signed)
Here for follow up.stated recent ( 2 w ago ) loss of 74 yr old brother whom pt cared for daily. Sad w loss she stated .support given.

## 2017-01-25 ENCOUNTER — Encounter: Payer: Self-pay | Admitting: Physician Assistant

## 2017-01-25 ENCOUNTER — Ambulatory Visit (INDEPENDENT_AMBULATORY_CARE_PROVIDER_SITE_OTHER): Payer: Medicare Other | Admitting: Physician Assistant

## 2017-01-25 VITALS — BP 128/80 | HR 79 | Temp 98.0°F | Resp 16 | Wt 166.0 lb

## 2017-01-25 DIAGNOSIS — E611 Iron deficiency: Secondary | ICD-10-CM | POA: Diagnosis not present

## 2017-01-25 DIAGNOSIS — H66001 Acute suppurative otitis media without spontaneous rupture of ear drum, right ear: Secondary | ICD-10-CM | POA: Diagnosis not present

## 2017-01-25 DIAGNOSIS — I951 Orthostatic hypotension: Secondary | ICD-10-CM

## 2017-01-25 MED ORDER — CEFDINIR 300 MG PO CAPS: 300.0000 mg | ORAL_CAPSULE | Freq: Two times a day (BID) | ORAL | 0 refills | Status: DC

## 2017-01-25 MED ORDER — MIDODRINE HCL 2.5 MG PO TABS: 2.5000 mg | ORAL_TABLET | Freq: Three times a day (TID) | ORAL | 1 refills | Status: DC

## 2017-01-25 NOTE — Patient Instructions (Signed)
Midodrine tablets  What is this medicine?  MIDODRINE (MI doe dreen) is used to treat low blood pressure in patients who have symptoms like dizziness when going from a sitting to a standing position.  This medicine may be used for other purposes; ask your health care provider or pharmacist if you have questions.  COMMON BRAND NAME(S): Orvaten, ProAmatine  What should I tell my health care provider before I take this medicine?  They need to know if you have any of the following conditions:  -difficulty passing urine  -heart disease  -high blood pressure  -kidney disease  -over active thyroid  -pheochromocytoma  -an unusual or allergic reaction to midodrine, other medicines, foods, dyes, or preservatives  -pregnant or trying to get pregnant  -breast-feeding  How should I use this medicine?  Take this medicine by mouth with a glass of water. Follow the directions on the prescription label. The last dose of this medicine should not be taken after the evening meal or less than 4 hours before bedtime. When you lie down for any length of time after taking this medicine, high blood pressure can occur. Do not take this medicine if you will be lying down for any length of time. Do not take your medicine more often than directed. Do not stop taking except on your doctor's advice.  Talk to your pediatrician regarding the use of this medicine in children. Special care may be needed.  Overdosage: If you think you have taken too much of this medicine contact a poison control center or emergency room at once.  NOTE: This medicine is only for you. Do not share this medicine with others.  What if I miss a dose?  If you miss a dose, take it as soon as you can. If it is almost time for your next dose, take only that dose. Do not take double or extra doses.  What may interact with this medicine?  Do not take this medicine with any of the following medications:  -MAOIs like Carbex, Eldepryl, Marplan, Nardil, and Parnate   -medicines called ergot alkaloids  -medicines for colds and breathing difficulties or weight loss  -procarbazine  This medicine may also interact with the following medications:  -cimetidine  -digoxin  -flecainide  -fludrocortisone  -metformin  -procainamide  -quinidine  -ranitidine  -triamterene  -medicines called alpha-blockers like doxazosin, prazosin, and terazosin  This list may not describe all possible interactions. Give your health care provider a list of all the medicines, herbs, non-prescription drugs, or dietary supplements you use. Also tell them if you smoke, drink alcohol, or use illegal drugs. Some items may interact with your medicine.  What should I watch for while using this medicine?  Visit your doctor or health care professional for regular checks on your progress.  You may get drowsy or dizzy. Do not drive, use machinery, or do anything that needs mental alertness until you know how this medicine affects you. Do not stand or sit up quickly, especially if you are an older patient. This reduces the risk of dizzy or fainting spells.  Your mouth may get dry. Chewing sugarless gum or sucking hard candy, and drinking plenty of water may help. Contact your doctor if the problem does not go away or is severe.  Do not treat yourself for coughs, colds, or pain while you are taking this medicine without asking your doctor or health care professional for advice. Some ingredients may increase your blood pressure.  What side effects may   I notice from receiving this medicine?  Side effects that you should report to your doctor or health care professional as soon as possible:  -awareness of heart beating  -blurred vision  -headache  -irregular heartbeat, palpitations, or chest pain  -pounding in the ears  -skin rash, hives  Side effects that usually do not require medical attention (report to your doctor or health care professional if they continue or are bothersome):  -change in heart rate  -chills   -goose bumps  -increased need to urinate  -itching  -stomach pain  -tingling in the skin or scalp  This list may not describe all possible side effects. Call your doctor for medical advice about side effects. You may report side effects to FDA at 1-800-FDA-1088.  Where should I keep my medicine?  Keep out of the reach of children.  Store at room temperature between 15 and 30 degrees C (59 and 86 degrees F). Throw away any unused medicine after the expiration date.  NOTE: This sheet is a summary. It may not cover all possible information. If you have questions about this medicine, talk to your doctor, pharmacist, or health care provider.  © 2018 [employer]/Gold Standard (2007-09-01 13:51:24)

## 2017-01-25 NOTE — Progress Notes (Signed)
Patient: Norma Johns Female    DOB: Jun 28, 1940   76 y.o.   MRN: [id number] Visit Date: 01/25/2017  Today's Provider: Mar Daring, PA-C   Chief Complaint  Patient presents with  . URI   Subjective:    URI   This is a new problem. The current episode started in the past 7 days (On Monday). The problem has been gradually worsening. There has been no fever. Associated symptoms include ear pain (Bilateral, the right is worse), headaches, sinus pain, sneezing and a sore throat. Pertinent negatives include no abdominal pain, chest pain, congestion, coughing, diarrhea, dysuria, nausea, plugged ear sensation, rhinorrhea, vomiting or wheezing. Treatments tried: gargle with Salt water. The treatment provided no relief.   Fall Risk  01/25/2017 10/30/2016 10/25/2016 06/12/2016 04/05/2016  Falls in the past year? Yes No No No No  Number falls in past yr: 1 - - - -  Injury with Fall? (No Data) - - - -  Comment She reports that she hit her head and it was hurting for certain days but now is better - - - -  Risk for fall due to : - Impaired balance/gait Impaired balance/gait - -   Orthostatic VS for the past 24 hrs:  BP- Lying Pulse- Lying BP- Sitting Pulse- Sitting BP- Standing at 0 minutes Pulse- Standing at 0 minutes  01/25/17 1411 160/80 78 130/80 82 120/70 89   Patient has been under a lot of stress recently. She was diagnosed with breast cancer over the summer and underwent a bilateral mastectomy. She has been having issues of chest tightness secondary to the scarring. She also has been dealing with her husband's declining health and the recent death of her brother about 4 weeks ago. Patient reports she is not sleeping great and is fatigued. She does report drinking 4-5 20oz bottles H2O daily. She also has not been eating as much, but is supplementing with protein drinks (boost). She does has trazodone 50mg  that has been given to her in the past for sleep but reports only taking 2  tablets.     Allergies  Allergen Reactions  . Chocolate Shortness Of Breath  . Other Shortness Of Breath and Swelling    PT STATES ALLERGY TO "SOME TYPE OF DYE THAT WAS USED AT [facility]."  SWELLING TO ARM AND SOB.  PT CANNOT REMEMBER WHAT THE DYE WAS USED FOR.  . Sulfa Antibiotics Other (See Comments)    unknown  . Atorvastatin Other (See Comments)    Leg pain   . Minocycline Hcl Itching  . Naproxen Other (See Comments)    Indigestion  . Niacin Other (See Comments)    Felt like she was on fire  . Septra [Sulfamethoxazole-Trimethoprim] Other (See Comments)    unknown  . Vioxx [Rofecoxib] Other (See Comments)    unknown  . Penicillins Itching and Rash    Has patient had a PCN reaction causing immediate rash, facial/tongue/throat swelling, SOB or lightheadedness with hypotension: Unknown Has patient had a PCN reaction causing severe rash involving mucus membranes or skin necrosis: Unknown Has patient had a PCN reaction that required hospitalization: No Has patient had a PCN reaction occurring within the last 10 years: No If all of the above answers are "NO", then may proceed with Cephalosporin use.      Current Outpatient Medications:  .  anastrozole (ARIMIDEX) 1 MG tablet, Take 1 tablet (1 mg total) by mouth daily., Disp: 30 tablet, Rfl: 3 .  fexofenadine (ALLEGRA) 180 MG tablet, Take 180 mg by mouth 3 (three) times a week. PRN, Disp: , Rfl:  .  fluticasone (FLONASE) 50 MCG/ACT nasal spray, Place 2 sprays into both nostrils daily., Disp: 16 g, Rfl: 5 .  Multiple Vitamin (MULTIVITAMIN) capsule, Take 1 capsule by mouth daily., Disp: , Rfl:  .  pantoprazole (PROTONIX) 40 MG tablet, Take 1 tablet (40 mg total) 2 (two) times daily before a meal by mouth., Disp: 180 tablet, Rfl: 1 .  traZODone (DESYREL) 50 MG tablet, Take 0.5-1 tablets (25-50 mg total) by mouth at bedtime as needed for sleep., Disp: 30 tablet, Rfl: 3 .  vitamin B-12 (CYANOCOBALAMIN) 1000 MCG tablet, Take 1,000  mcg by mouth daily., Disp: , Rfl:  .  albuterol (PROVENTIL HFA;VENTOLIN HFA) 108 (90 Base) MCG/ACT inhaler, Inhale 2 puffs into the lungs every 4 (four) hours as needed for wheezing or shortness of breath. (Patient not taking: Reported on 01/09/2017), Disp: 1 Inhaler, Rfl: 11 .  ALPRAZolam (XANAX) 0.5 MG tablet, Take 1 tablet (0.5 mg total) by mouth at bedtime as needed for anxiety. (Patient not taking: Reported on 01/08/2017), Disp: 30 tablet, Rfl: 5 .  polyethylene glycol (MIRALAX / GLYCOLAX) packet, Take 17 g by mouth daily as needed for mild constipation., Disp: , Rfl:   Review of Systems  Constitutional: Positive for fatigue. Negative for chills and fever.  HENT: Positive for ear pain (Bilateral, the right is worse), postnasal drip, sinus pressure, sinus pain, sneezing, sore throat, tinnitus (right ear), trouble swallowing and voice change. Negative for congestion and rhinorrhea.   Eyes: Positive for visual disturbance ("she needs cataract surgery").  Respiratory: Negative for cough and wheezing.   Cardiovascular: Negative for chest pain.  Gastrointestinal: Negative for abdominal pain, diarrhea, nausea and vomiting.  Genitourinary: Negative for dysuria.  Musculoskeletal: Positive for myalgias (legs).  Neurological: Positive for dizziness, light-headedness and headaches.    Social History   Tobacco Use  . Smoking status: Never Smoker  . Smokeless tobacco: Never Used  Substance Use Topics  . Alcohol use: No   Objective:   BP 128/80 (BP Location: Right Arm, Patient Position: Sitting, Cuff Size: Normal)   Pulse 79   Temp 98 F (36.7 C) (Oral)   Resp 16   Wt 166 lb (75.3 kg)   SpO2 97%   BMI 29.41 kg/m     Physical Exam  Constitutional: She appears well-developed and well-nourished. No distress.  HENT:  Head: Normocephalic and atraumatic.  Right Ear: Hearing, external ear and ear canal normal. Tympanic membrane is erythematous and bulging. A middle ear effusion is present.    Left Ear: Hearing, tympanic membrane, external ear and ear canal normal.  Nose: No mucosal edema or rhinorrhea. Right sinus exhibits maxillary sinus tenderness. Right sinus exhibits no frontal sinus tenderness. Left sinus exhibits no maxillary sinus tenderness and no frontal sinus tenderness.  Mouth/Throat: Uvula is midline, oropharynx is clear and moist and mucous membranes are normal. No oropharyngeal exudate, posterior oropharyngeal edema or posterior oropharyngeal erythema.  Eyes: Conjunctivae are normal. Pupils are equal, round, and reactive to light. Right eye exhibits no discharge. Left eye exhibits no discharge. No scleral icterus.  Neck: Normal range of motion. Neck supple. No tracheal deviation present. No thyromegaly present.  Cardiovascular: Normal rate, regular rhythm and normal heart sounds. Exam reveals no gallop and no friction rub.  No murmur heard. Pulmonary/Chest: Effort normal and breath sounds normal. No stridor. No respiratory distress. She has no wheezes. She has no  rales.  Lymphadenopathy:    She has no cervical adenopathy.  Skin: Skin is warm and dry. She is not diaphoretic.  Vitals reviewed.      Assessment & Plan:     1. Acute suppurative otitis media of right ear without spontaneous rupture of tympanic membrane, recurrence not specified New otitis media of right ear. Will give omnicef as below (patient has tolerated keflex in the past). Tylenol prn for pain and fevers. She is to call if no improvement. - cefdinir (OMNICEF) 300 MG capsule; Take 1 capsule (300 mg total) by mouth 2 (two) times daily.  Dispense: 20 capsule; Refill: 0  2. Iron deficiency H/O this and wants to recheck labs due to increased fatigue. Will check labs as below and f/u pending results. - CBC w/Diff/Platelet - Fe+TIBC+Fer  3. Orthostatic hypotension New onset. Advised to push fluids, wear compression stockings. No medication trigger known. Will give midodrine for BP support. I will see  her back in 4 weeks to recheck.  - midodrine (PROAMATINE) 2.5 MG tablet; Take 1 tablet (2.5 mg total) by mouth 3 (three) times daily with meals.  Dispense: 90 tablet; Refill: [place], PA-C  [facility]

## 2017-01-29 ENCOUNTER — Other Ambulatory Visit
Admission: RE | Admit: 2017-01-29 | Discharge: 2017-01-29 | Disposition: A | Discharge status: Home / Self Care | Payer: Medicare Other | Source: Ambulatory Visit | Attending: Physician Assistant | Admitting: Physician Assistant

## 2017-01-29 ENCOUNTER — Encounter: Payer: Self-pay | Admitting: General Surgery

## 2017-01-29 ENCOUNTER — Ambulatory Visit (INDEPENDENT_AMBULATORY_CARE_PROVIDER_SITE_OTHER): Payer: Medicare Other | Admitting: General Surgery

## 2017-01-29 VITALS — BP 140/80 | HR 82 | Resp 14 | Ht 64.0 in | Wt 167.0 lb

## 2017-01-29 DIAGNOSIS — C50512 Malignant neoplasm of lower-outer quadrant of left female breast: Secondary | ICD-10-CM | POA: Diagnosis not present

## 2017-01-29 DIAGNOSIS — N6489 Other specified disorders of breast: Secondary | ICD-10-CM

## 2017-01-29 DIAGNOSIS — E611 Iron deficiency: Secondary | ICD-10-CM | POA: Diagnosis not present

## 2017-01-29 DIAGNOSIS — Z170 Estrogen receptor positive status [ER+]: Secondary | ICD-10-CM | POA: Insufficient documentation

## 2017-01-29 LAB — IRON AND TIBC
IRON: 76 ug/dL (ref 28–170)
SATURATION RATIOS: 23 % (ref 10.4–31.8)
TIBC: 326 ug/dL (ref 250–450)
UIBC: 251 ug/dL

## 2017-01-29 LAB — CBC WITH DIFFERENTIAL/PLATELET
BASOS PCT: 1 %
Basophils Absolute: 0 10*3/uL (ref 0–0.1)
EOS ABS: 0.1 10*3/uL (ref 0–0.7)
Eosinophils Relative: 1 %
HEMATOCRIT: 39.6 % (ref 35.0–47.0)
HEMOGLOBIN: 13.1 g/dL (ref 12.0–16.0)
Lymphocytes Relative: 34 %
Lymphs Abs: 2.2 10*3/uL (ref 1.0–3.6)
MCH: 29.7 pg (ref 26.0–34.0)
MCHC: 33.2 g/dL (ref 32.0–36.0)
MCV: 89.3 fL (ref 80.0–100.0)
Monocytes Absolute: 0.5 10*3/uL (ref 0.2–0.9)
Monocytes Relative: 8 %
NEUTROS ABS: 3.6 10*3/uL (ref 1.4–6.5)
NEUTROS PCT: 56 %
Platelets: 268 10*3/uL (ref 150–440)
RBC: 4.43 MIL/uL (ref 3.80–5.20)
RDW: 13.9 % (ref 11.5–14.5)
WBC: 6.4 10*3/uL (ref 3.6–11.0)

## 2017-01-29 LAB — FERRITIN: FERRITIN: 33 ng/mL (ref 11–307)

## 2017-01-29 NOTE — Progress Notes (Signed)
Patient ID: Norma Johns, female   DOB: 1941-02-14, 76 y.o.   MRN: [id number]  No chief complaint on file.   HPI Norma Johns is a 76 y.o. female here today for her follow up seroma. Patient states the area is tight. She states she did not want to do more treatments.  HPI  Past Medical History:  Diagnosis Date  . Anemia   . Anxiety   . Arthritis   . Asthma    WELL CONTROLLED  . Breast cancer ([place]) 2018   left breast cnacer  . Cancer ([place])    Bilat Mastectomy  . Depression   . GERD (gastroesophageal reflux disease)   . Headache    H/O  . Hyperlipidemia   . Sleep apnea    DOES NOT USE CPAP-COULD NOT TOLERATE  . Stroke [facility]) 2004    Past Surgical History:  Procedure Laterality Date  . ABDOMINAL HYSTERECTOMY    . BLADDER REPAIR    . BREAST BIOPSY Right 03/23/1991   Fibroadenoma with intramammary lymph node. Right breast, 9:00.  [facility] CHOLECYSTECTOMY    . COLONOSCOPY  2005  . MASTECTOMY MODIFIED RADICAL Left 10/01/2016   Procedure: MASTECTOMY MODIFIED RADICAL;  Surgeon: Robert Bellow, MD;  Location: [facility] ORS;  Service: General;  Laterality: Left;  . MR MRA CAROTID  02/2010   Minimal plaque formation; no significant stenosis. Sx: Syncope  . [facility]  . SENTINEL NODE BIOPSY Right 10/01/2016   Procedure: SENTINEL NODE BIOPSY;  Surgeon: Robert Bellow, MD;  Location: [facility] ORS;  Service: General;  Laterality: Right;  . SIMPLE MASTECTOMY WITH AXILLARY SENTINEL NODE BIOPSY Right 10/01/2016   Procedure: SIMPLE MASTECTOMY;  Surgeon: Robert Bellow, MD;  Location: [facility] ORS;  Service: General;  Laterality: Right;    Family History  Problem Relation Age of Onset  . Cancer Father   . Prostate cancer Father   . Heart attack Mother   . Hypertension Mother   . CAD Mother   . Hypertension Son   . Diabetes Son   . Lung cancer Brother   . Leukemia Brother   . Lung cancer Brother   . Prostate cancer Brother   . Breast cancer Neg Hx      Social History Social History   Tobacco Use  . Smoking status: Never Smoker  . Smokeless tobacco: Never Used  Substance Use Topics  . Alcohol use: No  . Drug use: No    Allergies  Allergen Reactions  . Chocolate Shortness Of Breath  . Other Shortness Of Breath and Swelling    PT STATES ALLERGY TO "SOME TYPE OF DYE THAT WAS USED AT [facility]."  SWELLING TO ARM AND SOB.  PT CANNOT REMEMBER WHAT THE DYE WAS USED FOR.  . Sulfa Antibiotics Other (See Comments)    unknown  . Atorvastatin Other (See Comments)    Leg pain   . Minocycline Hcl Itching  . Naproxen Other (See Comments)    Indigestion  . Niacin Other (See Comments)    Felt like she was on fire  . Septra [Sulfamethoxazole-Trimethoprim] Other (See Comments)    unknown  . Vioxx [Rofecoxib] Other (See Comments)    unknown  . Penicillins Itching and Rash    Has patient had a PCN reaction causing immediate rash, facial/tongue/throat swelling, SOB or lightheadedness with hypotension: Unknown Has patient had a PCN reaction causing severe rash involving mucus membranes or skin necrosis: Unknown Has patient  had a PCN reaction that required hospitalization: No Has patient had a PCN reaction occurring within the last 10 years: No If all of the above answers are "NO", then may proceed with Cephalosporin use.     Current Outpatient Medications  Medication Sig Dispense Refill  . albuterol (PROVENTIL HFA;VENTOLIN HFA) 108 (90 Base) MCG/ACT inhaler Inhale 2 puffs into the lungs every 4 (four) hours as needed for wheezing or shortness of breath. 1 Inhaler 11  . ALPRAZolam (XANAX) 0.5 MG tablet Take 1 tablet (0.5 mg total) by mouth at bedtime as needed for anxiety. 30 tablet 5  . anastrozole (ARIMIDEX) 1 MG tablet Take 1 tablet (1 mg total) by mouth daily. 30 tablet 3  . cefdinir (OMNICEF) 300 MG capsule Take 1 capsule (300 mg total) by mouth 2 (two) times daily. 20 capsule 0  . fexofenadine (ALLEGRA) 180 MG tablet Take 180  mg by mouth 3 (three) times a week. PRN    . fluticasone (FLONASE) 50 MCG/ACT nasal spray Place 2 sprays into both nostrils daily. 16 g 5  . midodrine (PROAMATINE) 2.5 MG tablet Take 1 tablet (2.5 mg total) by mouth 3 (three) times daily with meals. 90 tablet 1  . Multiple Vitamin (MULTIVITAMIN) capsule Take 1 capsule by mouth daily.    . pantoprazole (PROTONIX) 40 MG tablet Take 1 tablet (40 mg total) 2 (two) times daily before a meal by mouth. 180 tablet 1  . polyethylene glycol (MIRALAX / GLYCOLAX) packet Take 17 g by mouth daily as needed for mild constipation.    . traZODone (DESYREL) 50 MG tablet Take 0.5-1 tablets (25-50 mg total) by mouth at bedtime as needed for sleep. 30 tablet 3  . vitamin B-12 (CYANOCOBALAMIN) 1000 MCG tablet Take 1,000 mcg by mouth daily.     No current facility-administered medications for this visit.     Review of Systems Review of Systems  Constitutional: Negative.   Respiratory: Negative.   Cardiovascular: Negative.     Blood pressure 140/80, pulse 82, resp. rate 14, height 5\' 4"  (1.626 m), weight 167 lb (75.8 kg).  Physical Exam Physical Exam  Constitutional: She is oriented to person, place, and time. She appears well-developed and well-nourished.  Neurological: She is alert and oriented to person, place, and time.  Skin: Skin is warm and dry.       Assessment    No evidence of residual seroma.    Plan         Patient may try stretching exercises to help remove the tight sensation across the chest some as well as make use of massage to the area for comfort. Patient to return in three months. The patient is aware to call back for any questions or concerns.   HPI, Physical Exam, Assessment and Plan have been scribed under the direction and in the presence of Hervey Ard, MD.  Gaspar Cola, CMA  I have completed the exam and reviewed the above documentation for accuracy and completeness.  I agree with the above.  Development worker, community has been used and any errors in dictation or transcription are unintentional.  Hervey Ard, M.D., F.A.C.S.   Robert Bellow 01/29/2017, 9:30 PM

## 2017-01-29 NOTE — Patient Instructions (Addendum)
Patient may try to stretch out some and [place] a cream and do a massage to the area.  Patient to return in three months. The patient is aware to call back for any questions or concerns.

## 2017-01-30 ENCOUNTER — Telehealth: Payer: Self-pay

## 2017-01-30 DIAGNOSIS — J0140 Acute pansinusitis, unspecified: Secondary | ICD-10-CM

## 2017-01-30 MED ORDER — DOXYCYCLINE HYCLATE 100 MG PO TABS: 100.0000 mg | ORAL_TABLET | Freq: Two times a day (BID) | ORAL | 0 refills | Status: DC

## 2017-01-30 NOTE — Telephone Encounter (Signed)
Per patient her throat is sore and a lot of chest congestion with the ear pain.  Thanks,  -Joseline

## 2017-01-30 NOTE — Telephone Encounter (Signed)
Patient states she started Cefdinir 300 mg on 11/30 for ear infection, but the pain is not improving yet. She is requesting a different antibiotic be sent to pharmacy. [phone]# [phone]

## 2017-01-30 NOTE — Telephone Encounter (Signed)
Will send in doxycycline

## 2017-01-30 NOTE — Telephone Encounter (Signed)
Can we see if she is just having ear pain now because if so I can treat with topical drops instead of oral.

## 2017-01-30 NOTE — Telephone Encounter (Signed)
Patient advised as directed below.  Thanks,  -Kalan Rinn 

## 2017-01-31 ENCOUNTER — Other Ambulatory Visit: Payer: Self-pay | Admitting: Oncology

## 2017-01-31 DIAGNOSIS — Z170 Estrogen receptor positive status [ER+]: Principal | ICD-10-CM

## 2017-01-31 DIAGNOSIS — C50512 Malignant neoplasm of lower-outer quadrant of left female breast: Secondary | ICD-10-CM

## 2017-02-05 ENCOUNTER — Inpatient Hospital Stay: Payer: Medicare Other

## 2017-02-05 NOTE — Progress Notes (Deleted)
Hematology/Oncology Follow Up Note [facility] Telephone:([phone] [phone]   Patient Care Team: Rubye Beach as PCP - General (Family Medicine) Bary Castilla Forest Gleason, MD (General Surgery) Rubye Beach as Physician Assistant (Family Medicine) Lyman Speller, RN as [place] Management  REASON FOR VISIT Follow up for treatment of breast cancer.  HISTORY OF PRESENTING ILLNESS/PERTINENT ONCOLOGY HISTORY Norma Johns is a 76 y.o.afemale who has above oncology history reviewed by me today presented for follow up visit for management of   evaluation and management of breast cancer.   Patient went for routine mammogram screening in May. Abnormalities was found on the left breast. Patient was called back to have diagnostic mammogram done on 08/08/2016 which showed left breast mass at 4:00 3 cm from nipple and a 4:00 6 cm of the nipple. Indeterminate left axillary lymph nodes.  Patient underwent biopsy on 08/15/2016. Pathology showed invasive lobular carcinoma for both breast lesion, . DCIS present, lymph node biopsy is positive for metastatic lobular carcinoma  MRI of the breast on 09/10/2016 showed the having small no mass area of progressive enhancement, left breast 2 biopsy proven malignancy seen in the left breast were separated by 3.4 cm band of weakly enhancing tissue. An area of stippled discontinuous no masses enhancement extends superiorly in the left breast in 2 the upper outer quadrant spanning approximately 3.2 cm anterior/superior to the more anterior of the 2 biopsy proven malignancy. Recommend MRI guided core biopsy of the treated area of non-mass enhancement. Patient was seen and evaluated by Dr. Tollie Pizza and position was left modified radical mastectomy, right simple mastectomy with sentinel node biopsy. Patient underwent surgery on 10/01/2016. SHe tolerated the procedure well.  She declined  systemic chemotherapy or radiation therapy. She was started on Arimidex '1mg'$  daily since September. Denies any hotflush or joint pain.  Her wound has healed well and she follows up with surgeon for seroma. She does not complaints about pain. She noticed a small area of fluid collection.   INTERVAL HISTORY Report reports feeling well. He follows up with Dr. Tollie Pizza for seroma. Reports weight gain and the increased fatty tissue on her abdominal wall.Denies fever or chills, shortness of breath, chest pain, abdominal pain.  She take single agent arimidex daily. Had some hot flashes but overall tolerating it very well. ROS:  Review of Systems  Constitutional: Negative for appetite change.  HENT:   Negative for hearing loss.   Eyes: Negative for eye problems.  Respiratory: Negative for chest tightness.   Cardiovascular: Negative for chest pain.  Gastrointestinal: Negative for abdominal distention.  Endocrine: Negative for hot flashes.  Genitourinary: Negative.  Negative for difficulty urinating.   Musculoskeletal: Negative for arthralgias.       Chronic back pain  Skin: Negative.  Negative for itching.  Neurological: Negative.  Negative for dizziness.  Hematological: Negative.  Negative for adenopathy.  Psychiatric/Behavioral: The patient is nervous/anxious.     MEDICAL HISTORY:  Past Medical History:  Diagnosis Date  . Anemia   . Anxiety   . Arthritis   . Asthma    WELL CONTROLLED  . Breast cancer ([place]) 2018   left breast cnacer  . Cancer ([place])    Bilat Mastectomy  . Depression   . GERD (gastroesophageal reflux disease)   . Headache    H/O  . Hyperlipidemia   . Sleep apnea    DOES NOT USE CPAP-COULD NOT TOLERATE  . Stroke [facility]) 2004  SURGICAL HISTORY: Past Surgical History:  Procedure Laterality Date  . ABDOMINAL HYSTERECTOMY    . BLADDER REPAIR    . BREAST BIOPSY Right 03/23/1991   Fibroadenoma with intramammary lymph node. Right breast, 9:00.  Marland Kitchen CHOLECYSTECTOMY      . COLONOSCOPY  2005  . MASTECTOMY MODIFIED RADICAL Left 10/01/2016   Procedure: MASTECTOMY MODIFIED RADICAL;  Surgeon: Robert Bellow, MD;  Location: [facility] ORS;  Service: General;  Laterality: Left;  . MR MRA CAROTID  02/2010   Minimal plaque formation; no significant stenosis. Sx: Syncope  . [place] [facility]  . SENTINEL NODE BIOPSY Right 10/01/2016   Procedure: SENTINEL NODE BIOPSY;  Surgeon: Robert Bellow, MD;  Location: [facility] ORS;  Service: General;  Laterality: Right;  . SIMPLE MASTECTOMY WITH AXILLARY SENTINEL NODE BIOPSY Right 10/01/2016   Procedure: SIMPLE MASTECTOMY;  Surgeon: Robert Bellow, MD;  Location: [facility] ORS;  Service: General;  Laterality: Right;    SOCIAL HISTORY: Social History   Socioeconomic History  . Marital status: Married    Spouse name: Not on file  . Number of children: 2  . Years of education: Not on file  . Highest education level: Not on file  Social Needs  . Financial resource strain: Not on file  . Food insecurity - worry: Not on file  . Food insecurity - inability: Not on file  . Transportation needs - medical: Not on file  . Transportation needs - non-medical: Not on file  Occupational History  . Not on file  Tobacco Use  . Smoking status: Never Smoker  . Smokeless tobacco: Never Used  Substance and Sexual Activity  . Alcohol use: No  . Drug use: No  . Sexual activity: No  Other Topics Concern  . Not on file  Social History Narrative  . Not on file    FAMILY HISTORY: Family History  Problem Relation Age of Onset  . Cancer Father   . Prostate cancer Father   . Heart attack Mother   . Hypertension Mother   . CAD Mother   . Hypertension Son   . Diabetes Son   . Lung cancer Brother   . Leukemia Brother   . Lung cancer Brother   . Prostate cancer Brother   . Breast cancer Neg Hx     ALLERGIES:  is allergic to chocolate; other; sulfa antibiotics; atorvastatin; minocycline hcl; naproxen; niacin; septra  [sulfamethoxazole-trimethoprim]; vioxx [rofecoxib]; and penicillins.  MEDICATIONS:  Current Outpatient Medications  Medication Sig Dispense Refill  . albuterol (PROVENTIL HFA;VENTOLIN HFA) 108 (90 Base) MCG/ACT inhaler Inhale 2 puffs into the lungs every 4 (four) hours as needed for wheezing or shortness of breath. 1 Inhaler 11  . ALPRAZolam (XANAX) 0.5 MG tablet Take 1 tablet (0.5 mg total) by mouth at bedtime as needed for anxiety. 30 tablet 5  . anastrozole (ARIMIDEX) 1 MG tablet TAKE (1) TABLET BY MOUTH EVERY DAY 30 tablet 3  . cefdinir (OMNICEF) 300 MG capsule Take 1 capsule (300 mg total) by mouth 2 (two) times daily. 20 capsule 0  . doxycycline (VIBRA-TABS) 100 MG tablet Take 1 tablet (100 mg total) by mouth 2 (two) times daily. 20 tablet 0  . fexofenadine (ALLEGRA) 180 MG tablet Take 180 mg by mouth 3 (three) times a week. PRN    . fluticasone (FLONASE) 50 MCG/ACT nasal spray Place 2 sprays into both nostrils daily. 16 g 5  . midodrine (PROAMATINE) 2.5 MG tablet Take 1  tablet (2.5 mg total) by mouth 3 (three) times daily with meals. 90 tablet 1  . Multiple Vitamin (MULTIVITAMIN) capsule Take 1 capsule by mouth daily.    . pantoprazole (PROTONIX) 40 MG tablet Take 1 tablet (40 mg total) 2 (two) times daily before a meal by mouth. 180 tablet 1  . polyethylene glycol (MIRALAX / GLYCOLAX) packet Take 17 g by mouth daily as needed for mild constipation.    . traZODone (DESYREL) 50 MG tablet Take 0.5-1 tablets (25-50 mg total) by mouth at bedtime as needed for sleep. 30 tablet 3  . vitamin B-12 (CYANOCOBALAMIN) 1000 MCG tablet Take 1,000 mcg by mouth daily.     No current facility-administered medications for this visit.       Marland Kitchen  PHYSICAL EXAMINATION: ECOG PERFORMANCE STATUS: 0 - Asymptomatic There were no vitals filed for this visit. There were no vitals filed for this visit.  GENERAL:  Alert, no distress and comfortable.  EYES: no pallor or icterus OROPHARYNX: no thrush or  ulceration; good dentition  NECK: supple, no masses felt LYMPH:  no palpable lymphadenopathy in the cervical, axillary or inguinal regions LUNGS: clear to auscultation and  No wheeze or crackles HEART/CVS: regular rate & rhythm and no murmurs; No lower extremity edema ABDOMEN: abdomen soft, non-tender and normal bowel sounds Musculoskeletal:no cyanosis of digits and no clubbing  PSYCH: alert & oriented x 3  NEURO: no focal motor/sensory deficits SKIN:  no rashes or significant lesions Breast exam is performed in seated and lying down position. Patient is status post bilateral mastectomy.  Scars are well healed.  LABORATORY DATA:  I have reviewed the data as listed Lab Results  Component Value Date   WBC 6.4 01/29/2017   HGB 13.1 01/29/2017   HCT 39.6 01/29/2017   MCV 89.3 01/29/2017   PLT 268 01/29/2017   Recent Labs    04/17/16 1215 07/05/16 1239 10/22/16 1018 11/28/16 1101 01/08/17 1033  NA 136 139 136 136 137  K 3.7 3.9 4.5 4.1 3.8  CL 104 108 103 105 106  CO2 '27 27 28 26 25  '$ GLUCOSE 132* 124* 127* 183* 134*  BUN '16 12 11 14 10  '$ CREATININE 0.71 0.63 0.67 0.65 0.58  CALCIUM 9.8 9.7 9.7 9.7 9.0  GFRNONAA >60 >60 >60 >60 >60  GFRAA >60 >60 >60 >60 >60  PROT 7.4 7.0 6.6  --   --   ALBUMIN 4.6 4.1 4.0  --   --   AST '18 19 15  '$ --   --   ALT 13* 14 11*  --   --   ALKPHOS 72 74 76  --   --   BILITOT 0.4 1.3* 0.8  --   --     RADIOGRAPHIC STUDIES: I have personally reviewed the radiological images as listed and agreed with the findings in the report. No results found. Pathology 10/01/2016 SPECIMEN SUBMITTED:  A. Breast, left, and axillary contents  B. Breast, right; simple mastectomy  C. Sentinel node 1, right  DIAGNOSIS:  A. LEFT BREAST AND AXILLARY CONTENTS; MODIFIED RADICAL MASTECTOMY:  - INVASIVE CARCINOMA WITH DUCTAL AND LOBULAR FEATURES.  - LARGEST FOCUS OF INVASIVE CARCINOMA MEASURES 10 MM.  - NINETEEN OF NINETEEN LYMPH NODES POSITIVE FOR METASTASIS  (19/19).  - BIOPSY SITE CHANGES, MARKER CLIP PRESENT.  - SURGICAL MARGINS ARE NEGATIVE.  B. RIGHT BREAST; SIMPLE MASTECTOMY:  - FIBROCYSTIC CHANGE.  - NEGATIVE FOR ATYPIA AND MALIGNANCY.  C. SENTINEL LYMPH NODE 1, RIGHT; EXCISION:  -  NO TUMOR SEEN IN ONE LYMPH NODE (0/1).  Surgical Pathology Cancer Case Summary   INVASIVE CARCINOMA OF THEBREAST  Procedure: modified radical mastectomy  Specimen Laterality: left  Histologic Type: invasive carcinoma with ductal and lobular features  Histologic Grade (Nottingham Histologic Score)       Glandular (Acinar)/Tubular Differentiation: 3       Nuclear Pleomorphism: 1       Mitotic Rate: 1       Overall Grade: 1  Tumor Size: 10 mm  Ductal Carcinoma in Situ (DCIS): present       Nuclear Grade: 1       Extensive intraductal component: negative  Lymphovascular Invasion: present  Treatment Effect: not applicable  Margins:       Invasive carcinoma: margins negative       Distance from closest margin: 20 mm from deep margin       DCIS: margins negative         Distance from closest margin: 20 mm from deep margin  Regional Lymph nodes:    Total # lymph nodes examined: 19    # Sentinel lymph nodes examined: 0    # Lymph nodes with macrometastasis (>2.0 mm): 18    # Lymph nodes with isolated tumor cells (<0.71m): 0    # Lymph nodes with micrometastasis (> 0.2 mm and < 2.0 mm): 1    Extranodal extension: focally present  Pathologic Stage Classification (pTNM, AJCC 7th Edition)    TNM Descriptors: m (multiple foci of invasive carcinoma)    pTNM: mpT1b pN3a   Note: Two masses are identified. The 3rd grossly described nodule in the  upper outer quadrant is an intramammary node nearly replaced by tumor.  Biomarker testing was performed on a separate specimen and in summary:  Estrogen Receptor (ER) Status: POSITIVE, >90%    Average intensity of staining:  Strong  Progesterone Receptor (PgR) Status: POSITIVE, >90%    Average intensity of staining: Moderate  HER2 (by immunohistochemistry): NEGATIVE (Score 1+)    Pathology 10/01/2016 SPECIMEN SUBMITTED:  A. Breast, left, 4:00, 3 CMFN, biopsy  B. Breast, left, 4:00, 6 CMFN, biopsy  C. Lymph node, left axilla, biopsy  DIAGNOSIS:  A. BREAST, LEFT, 4 O'CLOCK 3 CM FROM NIPPLE; ULTRASOUND-GUIDED CORE  BIOPSY:  - INVASIVE LOBULAR CARCINOMA, CLASSIC TYPE.  Size of invasive carcinoma: 6 mm in this sample  Histologic grade of invasive carcinoma: Grade 1    Glandular/tubular differentiation score: 3    Nuclear pleomorphism score: 1    Mitotic rate score: 1    Total score: 5  Ductal carcinomain situ: Present, low grade  Lymphovascular invasion: Not identified  B. BREAST, LEFT, 4 O'CLOCK 6 CM FROM NIPPLE; ULTRASOUND-GUIDED CORE  BIOPSY:  - INVASIVE LOBULAR CARCINOMA, CLASSIC TYPE.  Size of invasive carcinoma: 6 mm in this sample  Histologic grade of invasive carcinoma: Grade 1    Glandular/tubular differentiation score: 3    Nuclear pleomorphism score: 1    Mitotic rate score: 1    Total score: 5  Ductal carcinoma in situ: Not identified  Lymphovascular invasion: Not identified  C. LYMPH NODE, LEFT AXILLA; ULTRASOUND-GUIDED CORE BIOPSY:  - METASTATIC LOBULAR CARCINOMA; TUMOR SPANS 7 MM.  - NO EXTRANODAL EXTENSION IN THIS SAMPLE.   ASSESSMENT & PLAN:  Cancer Staging Malignant neoplasm of lower-outer quadrant of left breast of female, estrogen receptor positive ([place] Staging form: Breast, AJCC 8th Edition - Clinical stage from 10/22/2016: Stage IIIB (cT1b(m), cN3a, cM0, G1, ER: Positive, PR:  Positive, HER2: Negative) - Signed by Earlie Server, MD on 10/22/2016 - Pathologic stage from 10/22/2016: pT1b(m), pN3a, cM0, ER: Positive, PR: Positive, HER2: Negative - Signed by Earlie Server, MD on 10/22/2016  No diagnosis found.  76 yo female with stage III B, ER/PR positive HER-2  negative invasive carcinoma with both ductal and lobular features. She has 19 out of 19 axillary lymph nodes positive on the left. S/p lumpectomy and declined both chemotherapy and radiation, persistent elevated CA 27.29, CT and bone scan negative for metastasis  Continue Arimidex 1 mg by mouth daily. Today patient is here for evaluation prior to monthly Zometa. Reports to have weight gain and increase of fatty tissue along her abdomen more. Discussed with patient that the waking can be a possible side effects from aromatase inhibitors. And I advised her to have healthy balanced diet and improve exercise. Continue Zometa monthly    All questions were answered. The patient knows to call the clinic with any problems questions or concerns.  Return of visit:  4-5 weeks for evaluation before third dose of zometa.     Earlie Server, MD, PhD Hematology Oncology [facility] Pager- [phone] 02/05/2017

## 2017-02-06 ENCOUNTER — Ambulatory Visit: Payer: Medicare Other

## 2017-02-06 ENCOUNTER — Ambulatory Visit: Payer: Medicare Other | Admitting: Oncology

## 2017-02-07 ENCOUNTER — Other Ambulatory Visit: Payer: Self-pay | Admitting: *Deleted

## 2017-02-07 ENCOUNTER — Ambulatory Visit: Payer: Self-pay | Admitting: *Deleted

## 2017-02-07 NOTE — Patient Outreach (Signed)
Unsuccessful telephone encounter to Norma Johns, 76 year old female- follow up on current clinical status.  Unable to leave a voice message as voice message not set up.  Plan:  RN CM to follow up again within next 2 days.     [name].   [place] [facility]  [phone]

## 2017-02-11 ENCOUNTER — Inpatient Hospital Stay: Payer: Medicare Other | Attending: Oncology

## 2017-02-11 DIAGNOSIS — Z79811 Long term (current) use of aromatase inhibitors: Secondary | ICD-10-CM | POA: Insufficient documentation

## 2017-02-11 DIAGNOSIS — Z170 Estrogen receptor positive status [ER+]: Secondary | ICD-10-CM | POA: Insufficient documentation

## 2017-02-11 DIAGNOSIS — C50512 Malignant neoplasm of lower-outer quadrant of left female breast: Secondary | ICD-10-CM | POA: Insufficient documentation

## 2017-02-11 DIAGNOSIS — Z8673 Personal history of transient ischemic attack (TIA), and cerebral infarction without residual deficits: Secondary | ICD-10-CM | POA: Diagnosis not present

## 2017-02-11 DIAGNOSIS — D0512 Intraductal carcinoma in situ of left breast: Secondary | ICD-10-CM | POA: Insufficient documentation

## 2017-02-11 DIAGNOSIS — Z8042 Family history of malignant neoplasm of prostate: Secondary | ICD-10-CM | POA: Diagnosis not present

## 2017-02-11 DIAGNOSIS — Z9049 Acquired absence of other specified parts of digestive tract: Secondary | ICD-10-CM | POA: Diagnosis not present

## 2017-02-11 DIAGNOSIS — F329 Major depressive disorder, single episode, unspecified: Secondary | ICD-10-CM | POA: Diagnosis not present

## 2017-02-11 DIAGNOSIS — Z79899 Other long term (current) drug therapy: Secondary | ICD-10-CM | POA: Insufficient documentation

## 2017-02-11 DIAGNOSIS — F419 Anxiety disorder, unspecified: Secondary | ICD-10-CM | POA: Insufficient documentation

## 2017-02-11 DIAGNOSIS — Z801 Family history of malignant neoplasm of trachea, bronchus and lung: Secondary | ICD-10-CM | POA: Diagnosis not present

## 2017-02-11 DIAGNOSIS — Z806 Family history of leukemia: Secondary | ICD-10-CM | POA: Insufficient documentation

## 2017-02-11 DIAGNOSIS — Z9013 Acquired absence of bilateral breasts and nipples: Secondary | ICD-10-CM | POA: Insufficient documentation

## 2017-02-11 DIAGNOSIS — M25569 Pain in unspecified knee: Secondary | ICD-10-CM | POA: Diagnosis not present

## 2017-02-11 DIAGNOSIS — D649 Anemia, unspecified: Secondary | ICD-10-CM | POA: Diagnosis not present

## 2017-02-11 DIAGNOSIS — E785 Hyperlipidemia, unspecified: Secondary | ICD-10-CM | POA: Insufficient documentation

## 2017-02-11 DIAGNOSIS — J45909 Unspecified asthma, uncomplicated: Secondary | ICD-10-CM | POA: Insufficient documentation

## 2017-02-11 DIAGNOSIS — M8580 Other specified disorders of bone density and structure, unspecified site: Secondary | ICD-10-CM | POA: Diagnosis not present

## 2017-02-11 DIAGNOSIS — K219 Gastro-esophageal reflux disease without esophagitis: Secondary | ICD-10-CM | POA: Diagnosis not present

## 2017-02-11 LAB — COMPREHENSIVE METABOLIC PANEL
ALBUMIN: 3.7 g/dL (ref 3.5–5.0)
ALT: 13 U/L — AB (ref 14–54)
AST: 20 U/L (ref 15–41)
Alkaline Phosphatase: 45 U/L (ref 38–126)
Anion gap: 7 (ref 5–15)
BUN: 12 mg/dL (ref 6–20)
CHLORIDE: 108 mmol/L (ref 101–111)
CO2: 24 mmol/L (ref 22–32)
CREATININE: 0.66 mg/dL (ref 0.44–1.00)
Calcium: 9.3 mg/dL (ref 8.9–10.3)
GFR calc Af Amer: >60 mL/min (ref >60)
GFR calc non Af Amer: >60 mL/min (ref >60)
GLUCOSE: 156 mg/dL — AB (ref 65–99)
POTASSIUM: 3.7 mmol/L (ref 3.5–5.1)
Sodium: 139 mmol/L (ref 135–145)
Total Bilirubin: 0.6 mg/dL (ref 0.3–1.2)
Total Protein: 6.6 g/dL (ref 6.5–8.1)

## 2017-02-11 LAB — CBC WITH DIFFERENTIAL/PLATELET
Basophils Absolute: 0.1 10*3/uL (ref 0–0.1)
Basophils Relative: 1 %
EOS PCT: 2 %
Eosinophils Absolute: 0.1 10*3/uL (ref 0–0.7)
HCT: 38.6 % (ref 35.0–47.0)
Hemoglobin: 12.8 g/dL (ref 12.0–16.0)
LYMPHS ABS: 2.1 10*3/uL (ref 1.0–3.6)
LYMPHS PCT: 36 %
MCH: 29.8 pg (ref 26.0–34.0)
MCHC: 33.1 g/dL (ref 32.0–36.0)
MCV: 89.9 fL (ref 80.0–100.0)
MONO ABS: 0.4 10*3/uL (ref 0.2–0.9)
MONOS PCT: 7 %
Neutro Abs: 3.2 10*3/uL (ref 1.4–6.5)
Neutrophils Relative %: 54 %
PLATELETS: 252 10*3/uL (ref 150–440)
RBC: 4.29 MIL/uL (ref 3.80–5.20)
RDW: 14.2 % (ref 11.5–14.5)
WBC: 6 10*3/uL (ref 3.6–11.0)

## 2017-02-12 NOTE — Progress Notes (Signed)
Hematology/Oncology Follow Up Note [facility] [phone] [phone]   Patient Care Team: Rubye Beach as PCP - General (Family Medicine) Bary Castilla Forest Gleason, MD (General Surgery) Rubye Beach as Physician Assistant (Family Medicine) Lyman Speller, RN as [place] Management  REASON FOR VISIT Follow up for treatment of breast cancer.  HISTORY OF PRESENTING ILLNESS/PERTINENT ONCOLOGY HISTORY Norma Johns is a 76 y.o.afemale who has above oncology history reviewed by me today presented for follow up visit for management of   evaluation and management of breast cancer.   Patient went for routine mammogram screening in May. Abnormalities was found on the left breast. Patient was called back to have diagnostic mammogram done on 08/08/2016 which showed left breast mass at 4:00 3 cm from nipple and a 4:00 6 cm of the nipple. Indeterminate left axillary lymph nodes.  Patient underwent biopsy on 08/15/2016. Pathology showed invasive lobular carcinoma for both breast lesion, . DCIS present, lymph node biopsy is positive for metastatic lobular carcinoma  MRI of the breast on 09/10/2016 showed the having small no mass area of progressive enhancement, left breast 2 biopsy proven malignancy seen in the left breast were separated by 3.4 cm band of weakly enhancing tissue. An area of stippled discontinuous no masses enhancement extends superiorly in the left breast in 2 the upper outer quadrant spanning approximately 3.2 cm anterior/superior to the more anterior of the 2 biopsy proven malignancy. Recommend MRI guided core biopsy of the treated area of non-mass enhancement. Patient was seen and evaluated by Dr. Tollie Pizza and position was left modified radical mastectomy, right simple mastectomy with sentinel node biopsy. Patient underwent surgery on 10/01/2016. SHe tolerated the procedure well.  She declined  systemic chemotherapy or radiation therapy. She was started on Arimidex 74m daily since September. Denies any hotflush or joint pain.  Her wound has healed well and she follows up with surgeon for seroma. She does not complaints about pain. She noticed a small area of fluid collection.   INTERVAL HISTORY Patient reports feeling well. She takes Arimidex as instructed. She has some lower extremity knee pain otherwise doing well. She follows up with Dr. BTollie Pizzafor seroma drainage. Patient tells me she plans to make an appointment with him to see if her seroma need to be drained again. Denies any fever or chills. Shortness of breath. Chest pain. Abdominal pain. She wears dentures and occasionally has gum pain which resolved after she removes denture.  ROS:  Review of Systems  Constitutional: Negative for chills.  HENT:   Negative for hearing loss and lump/mass.   Eyes: Negative for eye problems and icterus.  Respiratory: Negative for chest tightness and cough.   Cardiovascular: Negative for chest pain.  Gastrointestinal: Negative for abdominal distention and abdominal pain.  Endocrine: Negative for hot flashes.  Genitourinary: Negative.  Negative for difficulty urinating and dyspareunia.   Musculoskeletal: Negative for arthralgias and back pain.       Chronic back pain  Skin: Negative.  Negative for itching and rash.  Neurological: Negative.  Negative for dizziness.  Hematological: Negative.  Negative for adenopathy. Does not bruise/bleed easily.  Psychiatric/Behavioral: The patient is not nervous/anxious.     MEDICAL HISTORY:  Past Medical History:  Diagnosis Date  . Anemia   . Anxiety   . Arthritis   . Asthma    WELL CONTROLLED  . [place]   [place]  [place] M[place]  . Depression   . GERD (gastroesophageal reflux disease)   . Headache    H/O  . Hyperlipidemia   . Sleep apnea    DOES NOT USE CPAP-COULD NOT TOLERATE  . Stroke [facility])  2004    SURGICAL HISTORY: Past Surgical History:  Procedure Laterality Date  . ABDOMINAL HYSTERECTOMY    . BLADDER REPAIR    . BREAST BIOPSY Right 03/23/1991   Fibroadenoma with intramammary lymph node. Right breast, 9:00.  Marland Kitchen CHOLECYSTECTOMY    . COLONOSCOPY  2005  . MASTECTOMY MODIFIED RADICAL Left 10/01/2016   Procedure: MASTECTOMY MODIFIED RADICAL;  Surgeon: Robert Bellow, MD;  Location: [facility] ORS;  Service: General;  Laterality: Left;  . MR MRA CAROTID  02/2010   Minimal plaque formation; no significant stenosis. Sx: Syncope  . [facility]  . SENTINEL NODE BIOPSY Right 10/01/2016   Procedure: SENTINEL NODE BIOPSY;  Surgeon: Robert Bellow, MD;  Location: [facility] ORS;  Service: General;  Laterality: Right;  . SIMPLE MASTECTOMY WITH AXILLARY SENTINEL NODE BIOPSY Right 10/01/2016   Procedure: SIMPLE MASTECTOMY;  Surgeon: Robert Bellow, MD;  Location: [facility] ORS;  Service: General;  Laterality: Right;    SOCIAL HISTORY: Social History   Socioeconomic History  . Marital status: Married    Spouse name: Not on file  . Number of children: 2  . Years of education: Not on file  . Highest education level: Not on file  Social Needs  . Financial resource strain: Not on file  . Food insecurity - worry: Not on file  . Food insecurity - inability: Not on file  . Transportation needs - medical: Not on file  . Transportation needs - non-medical: Not on file  Occupational History  . Not on file  Tobacco Use  . Smoking status: Never Smoker  . Smokeless tobacco: Never Used  Substance and Sexual Activity  . Alcohol use: No  . Drug use: No  . Sexual activity: No  Other Topics Concern  . Not on file  Social History Narrative  . Not on file    FAMILY HISTORY: Family History  Problem Relation Age of Onset  . Cancer Father   . Prostate cancer Father   . Heart attack Mother   . Hypertension Mother   . CAD Mother   . Hypertension Son   . Diabetes Son     . Lung cancer Brother   . Leukemia Brother   . Lung cancer Brother   . Prostate cancer Brother   . Breast cancer Neg Hx     ALLERGIES:  is allergic to chocolate; other; sulfa antibiotics; atorvastatin; minocycline hcl; naproxen; niacin; septra [sulfamethoxazole-trimethoprim]; vioxx [rofecoxib]; and penicillins.  MEDICATIONS:  Current Outpatient Medications  Medication Sig Dispense Refill  . albuterol (PROVENTIL HFA;VENTOLIN HFA) 108 (90 Base) MCG/ACT inhaler Inhale 2 puffs into the lungs every 4 (four) hours as needed for wheezing or shortness of breath. 1 Inhaler 11  . ALPRAZolam (XANAX) 0.5 MG tablet Take 1 tablet (0.5 mg total) by mouth at bedtime as needed for anxiety. 30 tablet 5  . anastrozole (ARIMIDEX) 1 MG tablet TAKE (1) TABLET BY MOUTH EVERY DAY 30 tablet 3  . cefdinir (OMNICEF) 300 MG capsule Take 1 capsule (300 mg total) by mouth 2 (two) times daily. 20 capsule 0  . doxycycline (VIBRA-TABS) 100 MG tablet Take 1 tablet (100 mg total) by mouth 2 (two) times daily. 20 tablet 0  .  fexofenadine (ALLEGRA) 180 MG tablet Take 180 mg by mouth 3 (three) times a week. PRN    . fluticasone (FLONASE) 50 MCG/ACT nasal spray Place 2 sprays into both nostrils daily. 16 g 5  . midodrine (PROAMATINE) 2.5 MG tablet Take 1 tablet (2.5 mg total) by mouth 3 (three) times daily with meals. 90 tablet 1  . Multiple Vitamin (MULTIVITAMIN) capsule Take 1 capsule by mouth daily.    . pantoprazole (PROTONIX) 40 MG tablet Take 1 tablet (40 mg total) 2 (two) times daily before a meal by mouth. 180 tablet 1  . polyethylene glycol (MIRALAX / GLYCOLAX) packet Take 17 g by mouth daily as needed for mild constipation.    . traZODone (DESYREL) 50 MG tablet Take 0.5-1 tablets (25-50 mg total) by mouth at bedtime as needed for sleep. 30 tablet 3  . vitamin B-12 (CYANOCOBALAMIN) 1000 MCG tablet Take 1,000 mcg by mouth daily.     No current facility-administered medications for this visit.       Marland Kitchen  PHYSICAL  EXAMINATION: ECOG PERFORMANCE STATUS: 0 - Asymptomatic Vitals:   02/13/17 1023  BP: (!) 145/88  Pulse: 77  Resp: 18  Temp: 97.8 F (36.6 C)   Filed Weights   02/13/17 1023  Weight: 168 lb (76.2 kg)   Physical Exam  Constitutional: She is oriented to person, place, and time and well-developed, well-nourished, and in no distress. No distress.  HENT:  Head: Normocephalic and atraumatic.  Eyes: Pupils are equal, round, and reactive to light. Left eye exhibits no discharge. No scleral icterus.  Neck: Normal range of motion. Neck supple. No JVD present.  Cardiovascular: Normal rate and normal heart sounds. Exam reveals no friction rub.  No murmur heard. Pulmonary/Chest: Effort normal and breath sounds normal.  Abdominal: Soft. Bowel sounds are normal. She exhibits no distension.  Musculoskeletal: Normal range of motion. She exhibits no edema.  Lymphadenopathy:    She has no cervical adenopathy.  Neurological: She is alert and oriented to person, place, and time. No cranial nerve deficit.  Skin: Skin is warm and dry.  Psychiatric: Affect normal.  Breast exam is performed in seated and lying down position. Patient is status post bilateral mastectomy.  Scars are well healed,  focal area over the sternum questionable seroma. LABORATORY DATA:  I have reviewed the data as listed Lab Results  Component Value Date   WBC 6.0 02/11/2017   HGB 12.8 02/11/2017   HCT 38.6 02/11/2017   MCV 89.9 02/11/2017   PLT 252 02/11/2017   Recent Labs    07/05/16 1239 10/22/16 1018 11/28/16 1101 01/08/17 1033 02/11/17 1155  NA 139 136 136 137 139  K 3.9 4.5 4.1 3.8 3.7  CL 108 103 105 106 108  CO2 _0 GLUCOSE 124* 127* 183* 134* 156*  BUN _1 CREATININE 0.63 0.67 0.65 0.58 0.66  CALCIUM 9.7 9.7 9.7 9.0 9.3  GFRNONAA >60 >60 >60 >60 >60  GFRAA >60 >60 >60 >60 >60  PROT 7.0 6.6  --   --  6.6  ALBUMIN 4.1 4.0  --   --  3.7  AST 19 15  --   --  20  ALT 14 11*  --   --   13*  ALKPHOS 74 76  --   --  45  BILITOT 1.3* 0.8  --   --  0.6    Pathology 10/01/2016 SPECIMEN SUBMITTED:  A. Breast, left, and axillary  contents  B. Breast, right; simple mastectomy  C. Sentinel node 1, right  DIAGNOSIS:  A. LEFT BREAST AND AXILLARY CONTENTS; MODIFIED RADICAL MASTECTOMY:  - INVASIVE CARCINOMA WITH DUCTAL AND LOBULAR FEATURES.  - LARGEST FOCUS OF INVASIVE CARCINOMA MEASURES 10 MM.  - NINETEEN OF NINETEEN LYMPH NODES POSITIVE FOR METASTASIS (19/19).  - BIOPSY SITE CHANGES, MARKER CLIP PRESENT.  - SURGICAL MARGINS ARE NEGATIVE.  B. RIGHT BREAST; SIMPLE MASTECTOMY:  - FIBROCYSTIC CHANGE.  - NEGATIVE FOR ATYPIA AND MALIGNANCY.  C. SENTINEL LYMPH NODE 1, RIGHT; EXCISION:  - NO TUMOR SEEN IN ONE LYMPH NODE (0/1).  Surgical Pathology Cancer Case Summary   INVASIVE CARCINOMA OF THEBREAST  Procedure: modified radical mastectomy  Specimen Laterality: left  Histologic Type: invasive carcinoma with ductal and lobular features  Histologic Grade (Nottingham Histologic Score)       Glandular (Acinar)/Tubular Differentiation: 3       Nuclear Pleomorphism: 1       Mitotic Rate: 1       Overall Grade: 1  Tumor Size: 10 mm  Ductal Carcinoma in Situ (DCIS): present       Nuclear Grade: 1       Extensive intraductal component: negative  Lymphovascular Invasion: present  Treatment Effect: not applicable  Margins:       Invasive carcinoma: margins negative       Distance from closest margin: 20 mm from deep margin       DCIS: margins negative         Distance from closest margin: 20 mm from deep margin  Regional Lymph nodes:    Total # lymph nodes examined: 19    # Sentinel lymph nodes examined: 0    # Lymph nodes with macrometastasis (>2.0 mm): 18    # Lymph nodes with isolated tumor cells (<0.62m): 0    # Lymph nodes with micrometastasis (> 0.2 mm and < 2.0 mm): 1    Extranodal  extension: focally present  Pathologic Stage Classification (pTNM, AJCC 7th Edition)    TNM Descriptors: m (multiple foci of invasive carcinoma)    pTNM: mpT1b pN3a   Note: Two masses are identified. The 3rd grossly described nodule in the  upper outer quadrant is an intramammary node nearly replaced by tumor.  Biomarker testing was performed on a separate specimen and in summary:  Estrogen Receptor (ER) Status: POSITIVE, >90%    Average intensity of staining: Strong  Progesterone Receptor (PgR) Status: POSITIVE, >90%    Average intensity of staining: Moderate  HER2 (by immunohistochemistry): NEGATIVE (Score 1+)    Pathology 10/01/2016 SPECIMEN SUBMITTED:  A. Breast, left, 4:00, 3 CMFN, biopsy  B. Breast, left, 4:00, 6 CMFN, biopsy  C. Lymph node, left axilla, biopsy  DIAGNOSIS:  A. BREAST, LEFT, 4 O'CLOCK 3 CM FROM NIPPLE; ULTRASOUND-GUIDED CORE  BIOPSY:  - INVASIVE LOBULAR CARCINOMA, CLASSIC TYPE.  Size of invasive carcinoma: 6 mm in this sample  Histologic grade of invasive carcinoma: Grade 1    Glandular/tubular differentiation score: 3    Nuclear pleomorphism score: 1    Mitotic rate score: 1    Total score: 5  Ductal carcinomain situ: Present, low grade  Lymphovascular invasion: Not identified  B. BREAST, LEFT, 4 O'CLOCK 6 CM FROM NIPPLE; ULTRASOUND-GUIDED CORE  BIOPSY:  - INVASIVE LOBULAR CARCINOMA, CLASSIC TYPE.  Size of invasive carcinoma: 6 mm in this sample  Histologic grade of invasive carcinoma: Grade 1    Glandular/tubular differentiation score: 3    Nuclear  pleomorphism score: 1    Mitotic rate score: 1    Total score: 5  Ductal carcinoma in situ: Not identified  Lymphovascular invasion: Not identified  C. LYMPH NODE, LEFT AXILLA; ULTRASOUND-GUIDED CORE BIOPSY:  - METASTATIC LOBULAR CARCINOMA; TUMOR SPANS 7 MM.  - NO EXTRANODAL EXTENSION IN THIS SAMPLE.   ASSESSMENT & PLAN:  Cancer Staging Malignant neoplasm of  lower-outer quadrant of left breast of female, estrogen receptor positive ([place]) Staging form: Breast, AJCC 8th Edition - Clinical stage from 10/22/2016: Stage IIIB (cT1b(m), cN3a, cM0, G1, ER: Positive, PR: Positive, HER2: Negative) - Signed by Earlie Server, MD on 10/22/2016 - Pathologic stage from 10/22/2016: pT1b(m), pN3a, cM0, ER: Positive, PR: Positive, HER2: Negative - Signed by Earlie Server, MD on 10/22/2016  1. Malignant neoplasm of lower-outer quadrant of left breast of female, estrogen receptor positive ([place])   2. Lobular carcinoma in situ (LCIS) of left breast   3. Osteopenia of neck of left femur     76 yo female with stage III B, ER/PR positive HER-2 negative invasive carcinoma with both ductal and lobular features. She has 19 out of 19 axillary lymph nodes positive on the left. S/p lumpectomy and declined both chemotherapy and radiation, persistent elevated CA 27.29, CT and bone scan negative for metastasis Will monitor clinically.   Tolerating Arimidex 1 mg by mouth daily. She is here for monthly evaluation prior to Zometa. Proceed with adjuvant Zometa today.  All questions were answered. The patient knows to call the clinic with any problems questions or concerns.  Return of visit:  4 weeks for evaluation before 4th dose of zometa.     Earlie Server, MD, PhD Hematology Oncology [facility] at [facility] Pager- [phone] 02/12/2017

## 2017-02-13 ENCOUNTER — Encounter: Payer: Self-pay | Admitting: Oncology

## 2017-02-13 ENCOUNTER — Inpatient Hospital Stay (HOSPITAL_BASED_OUTPATIENT_CLINIC_OR_DEPARTMENT_OTHER): Payer: Medicare Other | Admitting: Oncology

## 2017-02-13 ENCOUNTER — Inpatient Hospital Stay: Payer: Medicare Other

## 2017-02-13 VITALS — BP 145/88 | HR 77 | Temp 97.8°F | Resp 18 | Wt 168.0 lb

## 2017-02-13 DIAGNOSIS — C50512 Malignant neoplasm of lower-outer quadrant of left female breast: Secondary | ICD-10-CM | POA: Diagnosis not present

## 2017-02-13 DIAGNOSIS — E785 Hyperlipidemia, unspecified: Secondary | ICD-10-CM

## 2017-02-13 DIAGNOSIS — Z801 Family history of malignant neoplasm of trachea, bronchus and lung: Secondary | ICD-10-CM | POA: Diagnosis not present

## 2017-02-13 DIAGNOSIS — M8580 Other specified disorders of bone density and structure, unspecified site: Secondary | ICD-10-CM | POA: Diagnosis not present

## 2017-02-13 DIAGNOSIS — J45909 Unspecified asthma, uncomplicated: Secondary | ICD-10-CM

## 2017-02-13 DIAGNOSIS — K219 Gastro-esophageal reflux disease without esophagitis: Secondary | ICD-10-CM | POA: Diagnosis not present

## 2017-02-13 DIAGNOSIS — Z9049 Acquired absence of other specified parts of digestive tract: Secondary | ICD-10-CM

## 2017-02-13 DIAGNOSIS — D649 Anemia, unspecified: Secondary | ICD-10-CM | POA: Diagnosis not present

## 2017-02-13 DIAGNOSIS — Z9013 Acquired absence of bilateral breasts and nipples: Secondary | ICD-10-CM

## 2017-02-13 DIAGNOSIS — Z8042 Family history of malignant neoplasm of prostate: Secondary | ICD-10-CM | POA: Diagnosis not present

## 2017-02-13 DIAGNOSIS — Z79899 Other long term (current) drug therapy: Secondary | ICD-10-CM | POA: Diagnosis not present

## 2017-02-13 DIAGNOSIS — F419 Anxiety disorder, unspecified: Secondary | ICD-10-CM

## 2017-02-13 DIAGNOSIS — D0512 Intraductal carcinoma in situ of left breast: Secondary | ICD-10-CM | POA: Diagnosis not present

## 2017-02-13 DIAGNOSIS — Z79811 Long term (current) use of aromatase inhibitors: Secondary | ICD-10-CM

## 2017-02-13 DIAGNOSIS — D0502 Lobular carcinoma in situ of left breast: Secondary | ICD-10-CM

## 2017-02-13 DIAGNOSIS — F329 Major depressive disorder, single episode, unspecified: Secondary | ICD-10-CM | POA: Diagnosis not present

## 2017-02-13 DIAGNOSIS — Z170 Estrogen receptor positive status [ER+]: Principal | ICD-10-CM

## 2017-02-13 DIAGNOSIS — Z806 Family history of leukemia: Secondary | ICD-10-CM | POA: Diagnosis not present

## 2017-02-13 DIAGNOSIS — M85852 Other specified disorders of bone density and structure, left thigh: Secondary | ICD-10-CM

## 2017-02-13 DIAGNOSIS — M25569 Pain in unspecified knee: Secondary | ICD-10-CM

## 2017-02-13 DIAGNOSIS — Z8673 Personal history of transient ischemic attack (TIA), and cerebral infarction without residual deficits: Secondary | ICD-10-CM

## 2017-02-13 MED ORDER — SODIUM CHLORIDE 0.9 % IV SOLN
Freq: Once | INTRAVENOUS | Status: AC
  Administered 2017-02-13: 12:00:00 via INTRAVENOUS
  Filled 2017-02-13: qty 1000

## 2017-02-13 MED ORDER — ZOLEDRONIC ACID 4 MG/100ML IV SOLN
4.0000 mg | Freq: Once | INTRAVENOUS | Status: AC
  Administered 2017-02-13: 4 mg via INTRAVENOUS
  Filled 2017-02-13: qty 100

## 2017-02-14 ENCOUNTER — Telehealth: Payer: Self-pay | Admitting: Physician Assistant

## 2017-02-14 DIAGNOSIS — N3000 Acute cystitis without hematuria: Secondary | ICD-10-CM

## 2017-02-14 MED ORDER — NITROFURANTOIN MACROCRYSTAL 100 MG PO CAPS: 100.0000 mg | ORAL_CAPSULE | Freq: Four times a day (QID) | ORAL | 0 refills | Status: DC

## 2017-02-14 NOTE — Telephone Encounter (Signed)
Pt called to schedule an appt for tomorrow with [name] b/c she thinks she has a UTI. Pt stated she has a lot of pressure, increase frequency, & burning while urinating. I advised pt [name] doesn't have an available appt for tomorrow. I offered pt an appt with Simona Huh. Pt stated that she only wants to see [name] and requested that if she can't see [name] tomorrow she requested a message be sent to see if [name] would send in an Rx to [facility]. Pt stated she didn't remember the name of the medication she usually takes for UTI. Pt stated that [name] has sent her an Rx for this without OV in the past. Please advise. Thanks TNP

## 2017-02-14 NOTE — Telephone Encounter (Signed)
Please advise. Thanks.  

## 2017-02-14 NOTE — Telephone Encounter (Signed)
Pt advised.

## 2017-02-14 NOTE — Telephone Encounter (Signed)
Macrobid sent in to [facility]. If symptoms do not improve she will need appt to check urine

## 2017-02-20 ENCOUNTER — Other Ambulatory Visit: Payer: Self-pay | Admitting: *Deleted

## 2017-02-20 NOTE — Patient Outreach (Signed)
Second unsuccessful telephone encounter to Norma Johns, 76 year old female, follow up on current clinical status.. Pt's history includes but not limited to GERD, Anxiety, benign hypertension, hypercholesteremia, osteoporosis, DM type II, CVA, Breast cancer.    HIPAA compliant voice message left with contact name and number.  Plan:  If no response, plan to follow up again in 2 days.    [name].   [place]  [phone]

## 2017-02-21 ENCOUNTER — Ambulatory Visit (INDEPENDENT_AMBULATORY_CARE_PROVIDER_SITE_OTHER): Payer: Medicare Other | Admitting: Physician Assistant

## 2017-02-21 ENCOUNTER — Encounter: Payer: Self-pay | Admitting: Physician Assistant

## 2017-02-21 VITALS — BP 156/70 | HR 72 | Resp 20 | Wt 166.0 lb

## 2017-02-21 DIAGNOSIS — N3000 Acute cystitis without hematuria: Secondary | ICD-10-CM

## 2017-02-21 DIAGNOSIS — R21 Rash and other nonspecific skin eruption: Secondary | ICD-10-CM

## 2017-02-21 DIAGNOSIS — R319 Hematuria, unspecified: Secondary | ICD-10-CM | POA: Diagnosis not present

## 2017-02-21 MED ORDER — NYSTATIN-TRIAMCINOLONE 100000-0.1 UNIT/GM-% EX CREA: 1.0000 "application " | TOPICAL_CREAM | Freq: Two times a day (BID) | CUTANEOUS | 0 refills | Status: DC

## 2017-02-21 MED ORDER — NITROFURANTOIN MACROCRYSTAL 100 MG PO CAPS: 100.0000 mg | ORAL_CAPSULE | Freq: Two times a day (BID) | ORAL | 0 refills | Status: DC

## 2017-02-21 NOTE — Progress Notes (Signed)
Patient: Norma Johns Female    DOB: 1940/12/28   76 y.o.   MRN: [id number] Visit Date: 02/21/2017  Today's Provider: Mar Daring, PA-C   Chief Complaint  Patient presents with  . Hypotension    orthostatic   . Urinary Tract Infection   Subjective:    HPI Patient comes in today for a follow up on orthostatic hypotension. She was advised to push fluids and wear the compression stockings. She was also prescribed midodrine 2.5mg  TID for BP support. Her iron levels were also checked and it was WNL. She reports she has not been taking the Midodrine and only doing conservative management with fluids and compression stockings.     She also reports that she has UTI symptoms, and was prescribed nitrofurantion about 5-7 days ago. She still has lower abdominal pressure that comes and goes.   Allergies  Allergen Reactions  . Chocolate Shortness Of Breath  . Other Shortness Of Breath and Swelling    PT STATES ALLERGY TO "SOME TYPE OF DYE THAT WAS USED AT [facility]."  SWELLING TO ARM AND SOB.  PT CANNOT REMEMBER WHAT THE DYE WAS USED FOR.  . Sulfa Antibiotics Other (See Comments)    unknown  . Atorvastatin Other (See Comments)    Leg pain   . Minocycline Hcl Itching  . Naproxen Other (See Comments)    Indigestion  . Niacin Other (See Comments)    Felt like she was on fire  . Septra [Sulfamethoxazole-Trimethoprim] Other (See Comments)    unknown  . Vioxx [Rofecoxib] Other (See Comments)    unknown  . Penicillins Itching and Rash    Has patient had a PCN reaction causing immediate rash, facial/tongue/throat swelling, SOB or lightheadedness with hypotension: Unknown Has patient had a PCN reaction causing severe rash involving mucus membranes or skin necrosis: Unknown Has patient had a PCN reaction that required hospitalization: No Has patient had a PCN reaction occurring within the last 10 years: No If all of the above answers are "NO", then may proceed with  Cephalosporin use.      Current Outpatient Medications:  .  albuterol (PROVENTIL HFA;VENTOLIN HFA) 108 (90 Base) MCG/ACT inhaler, Inhale 2 puffs into the lungs every 4 (four) hours as needed for wheezing or shortness of breath., Disp: 1 Inhaler, Rfl: 11 .  ALPRAZolam (XANAX) 0.5 MG tablet, Take 1 tablet (0.5 mg total) by mouth at bedtime as needed for anxiety., Disp: 30 tablet, Rfl: 5 .  anastrozole (ARIMIDEX) 1 MG tablet, TAKE (1) TABLET BY MOUTH EVERY DAY, Disp: 30 tablet, Rfl: 3 .  fexofenadine (ALLEGRA) 180 MG tablet, Take 180 mg by mouth 3 (three) times a week. PRN, Disp: , Rfl:  .  fluticasone (FLONASE) 50 MCG/ACT nasal spray, Place 2 sprays into both nostrils daily., Disp: 16 g, Rfl: 5 .  Multiple Vitamin (MULTIVITAMIN) capsule, Take 1 capsule by mouth daily., Disp: , Rfl:  .  nitrofurantoin (MACRODANTIN) 100 MG capsule, Take 1 capsule (100 mg total) by mouth 4 (four) times daily., Disp: 14 capsule, Rfl: 0 .  pantoprazole (PROTONIX) 40 MG tablet, Take 1 tablet (40 mg total) 2 (two) times daily before a meal by mouth., Disp: 180 tablet, Rfl: 1 .  polyethylene glycol (MIRALAX / GLYCOLAX) packet, Take 17 g by mouth daily as needed for mild constipation., Disp: , Rfl:  .  vitamin B-12 (CYANOCOBALAMIN) 1000 MCG tablet, Take 1,000 mcg by mouth daily., Disp: , Rfl:  .  traZODone (DESYREL) 50 MG tablet, Take 0.5-1 tablets (25-50 mg total) by mouth at bedtime as needed for sleep. (Patient not taking: Reported on 02/13/2017), Disp: 30 tablet, Rfl: 3  Review of Systems  Constitutional: Positive for chills and fatigue.  Gastrointestinal: Positive for abdominal pain (pressure).  Genitourinary: Positive for frequency.  Neurological: Positive for dizziness (much improved from previously).    Social History   Tobacco Use  . Smoking status: Never Smoker  . Smokeless tobacco: Never Used  Substance Use Topics  . Alcohol use: No   Objective:   BP (!) 156/70 (BP Location: Right Arm, Patient  Position: Sitting, Cuff Size: Normal)   Pulse 72   Resp 20   Wt 166 lb (75.3 kg)   BMI 28.49 kg/m  Vitals:   02/21/17 1106  BP: (!) 156/70  Pulse: 72  Resp: 20  Weight: 166 lb (75.3 kg)     Physical Exam  Constitutional: She appears well-developed and well-nourished. No distress.  HENT:  Head: Normocephalic and atraumatic.  Right Ear: Hearing, tympanic membrane, external ear and ear canal normal.  Left Ear: Hearing, tympanic membrane, external ear and ear canal normal.  Nose: Nose normal.  Mouth/Throat: Uvula is midline, oropharynx is clear and moist and mucous membranes are normal. No oropharyngeal exudate.  Eyes: Conjunctivae are normal. Pupils are equal, round, and reactive to light. Right eye exhibits no discharge. Left eye exhibits no discharge. No scleral icterus.  Neck: Normal range of motion. Neck supple. No tracheal deviation present. No thyromegaly present.  Cardiovascular: Normal rate, regular rhythm and normal heart sounds. Exam reveals no gallop and no friction rub.  No murmur heard. Pulmonary/Chest: Effort normal and breath sounds normal. No stridor. No respiratory distress. She has no wheezes. She has no rales.    Abdominal: Soft. Bowel sounds are normal. She exhibits no distension. There is no tenderness. There is no CVA tenderness.  Lymphadenopathy:    She has no cervical adenopathy.  Skin: Skin is warm and dry. She is not diaphoretic.  Vitals reviewed.     Assessment & Plan:     1. Acute cystitis without hematuria Will give a few more days of macrobid for treatment of UTI since patient still having pressure but it had been improving. UA normal today with exception of hematuria noted. Will send Urine for microscopy to confirm if RBC truly present. If so will refer to Urology.  - nitrofurantoin (MACRODANTIN) 100 MG capsule; Take 1 capsule (100 mg total) by mouth 2 (two) times daily.  Dispense: 14 capsule; Refill: 0  2. Hematuria, unspecified type See above  medical treatment plan. - Urinalysis, microscopic only  3. Rash of periwound skin Will give mycolog cream as below for irritation of skin due to drainage around surgical incisions from mastectomy.  - nystatin-triamcinolone (MYCOLOG II) cream; Apply 1 application topically 2 (two) times daily.  Dispense: 30 g; Refill: 0       Mar Daring, PA-C  [facility]

## 2017-02-21 NOTE — Patient Instructions (Signed)
Nystatin; Triamcinolone cream or ointment What is this medicine? NYSTATIN; TRIAMCINOLONE (nye STAT in; trye am SIN oh lone) is a combination of an antifungal medicine and a steroid. It is used to treat certain kinds of fungal or yeast infections of the skin. This medicine may be used for other purposes; ask your health care provider or pharmacist if you have questions. COMMON BRAND NAME(S): Myco-Triacet-II, Mycogen-II, Mycolog II, Mytrex, N.T.A. What should I tell my health care provider before I take this medicine? They need to know if you have any of these conditions: -large areas of burned or damaged skin -skin wasting or thinning -peripheral vascular disease or poor circulation -an unusual or allergic reaction to nystatin, triamcinolone, other corticosteroids, other medicines, foods, dyes, or preservatives -pregnant or trying to get pregnant -breast-feeding How should I use this medicine? This medicine is for external use only. Do not take by mouth. Follow the directions on the prescription label. Wash your hands before and after use. If treating hand or nail infections, wash hands before use only. Apply a thin layer of this medicine to the affected area and rub in gently. Do not use on healthy skin or over large areas of skin. Do not get this medicine in your eyes. If you do, rinse out with plenty of cool tap water. When applying to the groin area, apply a limited amount and do not use for longer than 2 weeks unless directed to by your doctor or health care professional. Do not cover or wrap the treated area with an airtight bandage (such as a plastic bandage). Use the full course of treatment prescribed, even if you think the infection is getting better. Use at regular intervals. Do not use your medicine more often than directed. Do not use this medicine for any condition other than the one for which it was prescribed. Talk to your pediatrician regarding the use of this medicine in children.  While this drug may be prescribed for selected conditions, precautions do apply. Children being treated in the diaper area should not wear tight-fitting diapers or plastic pants. Elderly patients are more likely to have damaged skin through aging, and this may increase side effects. This medicine should only be used for brief periods and infrequently in older patients. Overdosage: If you think you have taken too much of this medicine contact a poison control center or emergency room at once. NOTE: This medicine is only for you. Do not share this medicine with others. What if I miss a dose? If you miss a dose, use it as soon as you can. If it is almost time for your next dose, use only that dose. Do not use double or extra doses. What may interact with this medicine? Interactions are not expected. Do not use any other skin products on the affected area without telling your doctor or health care professional. This list may not describe all possible interactions. Give your health care provider a list of all the medicines, herbs, non-prescription drugs, or dietary supplements you use. Also tell them if you smoke, drink alcohol, or use illegal drugs. Some items may interact with your medicine. What should I watch for while using this medicine? Tell your doctor or health care professional if your symptoms do not start to get better within 1 week when treating the groin area or within 2 weeks when treating the feet. . Tell your doctor or health care professional if you develop sores or blisters that do not heal properly. If your skin  infection returns after stopping this medicine, contact your doctor or health care professional. If you are using this medicine to treat an infection in the groin area, do not wear underwear that is tight-fitting or made from synthetic fibers such as rayon or nylon. Instead, wear loose-fitting, cotton underwear. Also dry the area completely after bathing. What side effects may I  notice from receiving this medicine? Side effects that you should report to your doctor or health care professional as soon as possible: -burning or itching of the skin -dark red spots on the skin -loss of feeling on skin -painful, red, pus-filled blisters in hair follicles -skin infection -thinning of the skin or sunburn: more likely if applied to the face Side effects that usually do not require medical attention (report to your doctor or health care professional if they continue or are bothersome): -dry or peeling skin -skin irritation This list may not describe all possible side effects. Call your doctor for medical advice about side effects. You may report side effects to FDA at 1-800-FDA-1088. Where should I keep my medicine? Keep out of the reach of children. Store at room temperature between 15 and 30 degrees C (59 and 86 degrees F). Do not freeze. Throw away any unused medicine after the expiration date. NOTE: This sheet is a summary. It may not cover all possible information. If you have questions about this medicine, talk to your doctor, pharmacist, or health care provider.  2018 Elsevier/Gold Standard (2007-09-05 17:29:26)

## 2017-02-22 LAB — URINALYSIS, MICROSCOPIC ONLY
Bacteria, UA: NONE SEEN /HPF
Hyaline Cast: NONE SEEN /LPF

## 2017-02-22 MED ORDER — CLOTRIMAZOLE 1 % EX CREA: 1.0000 "application " | TOPICAL_CREAM | Freq: Two times a day (BID) | CUTANEOUS | 0 refills | Status: DC

## 2017-02-22 MED ORDER — TRIAMCINOLONE ACETONIDE 0.1 % EX CREA: 1.0000 "application " | TOPICAL_CREAM | Freq: Two times a day (BID) | CUTANEOUS | 0 refills | Status: DC

## 2017-02-22 NOTE — Addendum Note (Signed)
Addended by: Mar Daring on: 02/22/2017 05:35 PM   Modules accepted: Orders

## 2017-02-27 ENCOUNTER — Ambulatory Visit (INDEPENDENT_AMBULATORY_CARE_PROVIDER_SITE_OTHER): Payer: Medicare Other | Admitting: General Surgery

## 2017-02-27 ENCOUNTER — Encounter: Payer: Self-pay | Admitting: General Surgery

## 2017-02-27 VITALS — BP 130/72 | HR 65 | Resp 16 | Ht 64.0 in | Wt 165.0 lb

## 2017-02-27 DIAGNOSIS — R29898 Other symptoms and signs involving the musculoskeletal system: Secondary | ICD-10-CM | POA: Diagnosis not present

## 2017-02-27 DIAGNOSIS — I890 Lymphedema, not elsewhere classified: Secondary | ICD-10-CM | POA: Insufficient documentation

## 2017-02-27 DIAGNOSIS — Z170 Estrogen receptor positive status [ER+]: Secondary | ICD-10-CM | POA: Diagnosis not present

## 2017-02-27 DIAGNOSIS — C50512 Malignant neoplasm of lower-outer quadrant of left female breast: Secondary | ICD-10-CM | POA: Diagnosis not present

## 2017-02-27 NOTE — Patient Instructions (Addendum)
Patient to return as scheduled. Needs a appointment for physical therapist.  The patient is aware to call back for any questions or concerns.

## 2017-02-27 NOTE — Progress Notes (Signed)
Patient ID: Norma Johns, female   DOB: Mar 08, 1940, 77 y.o.   MRN: [id number]  Chief Complaint  Patient presents with  . Follow-up    HPI Norma Johns is a 77 y.o. female here today because she noticed some left arm swelling and chest wall tightness. She states his has been going on for about a week. She just finished her antibiotics for a sinus infection she had .  HPI  Past Medical History:  Diagnosis Date  . Anemia   . Anxiety   . Arthritis   . Asthma    WELL CONTROLLED  . Breast cancer ([place]) 2018   left breast cnacer  . Cancer ([place])    Bilat Mastectomy  . Depression   . GERD (gastroesophageal reflux disease)   . Headache    H/O  . Hyperlipidemia   . Sleep apnea    DOES NOT USE CPAP-COULD NOT TOLERATE  . Stroke [facility]) 2004    Past Surgical History:  Procedure Laterality Date  . ABDOMINAL HYSTERECTOMY    . BLADDER REPAIR    . BREAST BIOPSY Right 03/23/1991   Fibroadenoma with intramammary lymph node. Right breast, 9:00.  Marland Kitchen CHOLECYSTECTOMY    . COLONOSCOPY  2005  . MASTECTOMY MODIFIED RADICAL Left 10/01/2016   Procedure: MASTECTOMY MODIFIED RADICAL;  Surgeon: Robert Bellow, MD;  Location: [facility] ORS;  Service: General;  Laterality: Left;  . MR MRA CAROTID  02/2010   Minimal plaque formation; no significant stenosis. Sx: Syncope  . [facility]  . SENTINEL NODE BIOPSY Right 10/01/2016   Procedure: SENTINEL NODE BIOPSY;  Surgeon: Robert Bellow, MD;  Location: [facility] ORS;  Service: General;  Laterality: Right;  . SIMPLE MASTECTOMY WITH AXILLARY SENTINEL NODE BIOPSY Right 10/01/2016   Procedure: SIMPLE MASTECTOMY;  Surgeon: Robert Bellow, MD;  Location: [facility] ORS;  Service: General;  Laterality: Right;    Family History  Problem Relation Age of Onset  . Cancer Father   . Prostate cancer Father   . Heart attack Mother   . Hypertension Mother   . CAD Mother   . Hypertension Son   . Diabetes Son   . Lung cancer Brother   .  Leukemia Brother   . Lung cancer Brother   . Prostate cancer Brother   . Breast cancer Neg Hx     Social History Social History   Tobacco Use  . Smoking status: Never Smoker  . Smokeless tobacco: Never Used  Substance Use Topics  . Alcohol use: No  . Drug use: No    Allergies  Allergen Reactions  . Chocolate Shortness Of Breath  . Other Shortness Of Breath and Swelling    PT STATES ALLERGY TO "SOME TYPE OF DYE THAT WAS USED AT [facility]."  SWELLING TO ARM AND SOB.  PT CANNOT REMEMBER WHAT THE DYE WAS USED FOR.  . Sulfa Antibiotics Other (See Comments)    unknown  . Atorvastatin Other (See Comments)    Leg pain   . Minocycline Hcl Itching  . Naproxen Other (See Comments)    Indigestion  . Niacin Other (See Comments)    Felt like she was on fire  . Septra [Sulfamethoxazole-Trimethoprim] Other (See Comments)    unknown  . Vioxx [Rofecoxib] Other (See Comments)    unknown  . Penicillins Itching and Rash    Has patient had a PCN reaction causing immediate rash, facial/tongue/throat swelling, SOB or lightheadedness with hypotension: Unknown  Has patient had a PCN reaction causing severe rash involving mucus membranes or skin necrosis: Unknown Has patient had a PCN reaction that required hospitalization: No Has patient had a PCN reaction occurring within the last 10 years: No If all of the above answers are "NO", then may proceed with Cephalosporin use.     Current Outpatient Medications  Medication Sig Dispense Refill  . albuterol (PROVENTIL HFA;VENTOLIN HFA) 108 (90 Base) MCG/ACT inhaler Inhale 2 puffs into the lungs every 4 (four) hours as needed for wheezing or shortness of breath. 1 Inhaler 11  . ALPRAZolam (XANAX) 0.5 MG tablet Take 1 tablet (0.5 mg total) by mouth at bedtime as needed for anxiety. 30 tablet 5  . anastrozole (ARIMIDEX) 1 MG tablet TAKE (1) TABLET BY MOUTH EVERY DAY 30 tablet 3  . clotrimazole (LOTRIMIN) 1 % cream Apply 1 application topically 2  (two) times daily. 30 g 0  . fexofenadine (ALLEGRA) 180 MG tablet Take 180 mg by mouth 3 (three) times a week. PRN    . fluticasone (FLONASE) 50 MCG/ACT nasal spray Place 2 sprays into both nostrils daily. 16 g 5  . Multiple Vitamin (MULTIVITAMIN) capsule Take 1 capsule by mouth daily.    . nitrofurantoin (MACRODANTIN) 100 MG capsule Take 1 capsule (100 mg total) by mouth 2 (two) times daily. 14 capsule 0  . pantoprazole (PROTONIX) 40 MG tablet Take 1 tablet (40 mg total) 2 (two) times daily before a meal by mouth. 180 tablet 1  . polyethylene glycol (MIRALAX / GLYCOLAX) packet Take 17 g by mouth daily as needed for mild constipation.    . traZODone (DESYREL) 50 MG tablet Take 0.5-1 tablets (25-50 mg total) by mouth at bedtime as needed for sleep. 30 tablet 3  . triamcinolone cream (KENALOG) 0.1 % Apply 1 application topically 2 (two) times daily. 30 g 0  . vitamin B-12 (CYANOCOBALAMIN) 1000 MCG tablet Take 1,000 mcg by mouth daily.     No current facility-administered medications for this visit.     Review of Systems Review of Systems  Constitutional: Negative.   Respiratory: Negative.   Cardiovascular: Negative.     Blood pressure 130/72, pulse 65, resp. rate 16, height 5\' 4"  (1.626 m), weight 165 lb (74.8 kg).  Physical Exam Physical Exam  Constitutional: She is oriented to person, place, and time. She appears well-developed and well-nourished.  Pulmonary/Chest:    No evidence of seroma formation under either mastectomy incision.  Musculoskeletal:       Arms: Neurological: She is alert and oriented to person, place, and time.  Decreased motor strength in biceps, triceps, wrist extensor and flexor and lumbricals.  Skin: Skin is warm and dry.    Data Reviewed Upper extremity measurements obtained 15 cm above as well as 10 and 20 cm below the olecranon process.  February 27, 2017 measurements:  Right: 33, 28, 22 centimeters.  Left: 34, 29, 24 cm.  December 26, 2016  measurements  Right: 33, 25.5, 21 cm.  Left: 34.5, 28, 22 cm.  November 22, 2016 measurements:  Right: 32.5, 28, 22 cm.  Left: 35, 29, 22 cm.    Mild swelling of the distal forearm and hand has developed over the last several months.       Assessment    Mild forearm lymphedema.  Subjective sense of tightness in the chest without clinical evidence of seroma, questionable muscle spasm related to mastectomy.  Diffuse weakness of the muscles involving the upper extremity.    Plan  No clear reason for left upper extremity weakness.  Will discuss with ortopaedics.  May need cervical MRI.  Physical therapy consult for assessment of massage therapy for chest wall discomfort and early left forearm lymphedema.      Patient to return as scheduled. Needs a appointment for physical therapist.  The patient is aware to call back for any questions or concerns.   HPI, Physical Exam, Assessment and Plan have been scribed under the direction and in the presence of Hervey Ard, MD.  Gaspar Cola, CMA  I have completed the exam and reviewed the above documentation for accuracy and completeness.  I agree with the above.  Haematologist has been used and any errors in dictation or transcription are unintentional.  Hervey Ard, M.D., F.A.C.S.  Robert Bellow 02/27/2017, 1:49 PM  Patient to be scheduled for an appointment with Gwenette Greet Dupreez,OT with [facility] (Phone: [phone]). Order entered in [place]. Their office was contacted today to confirm that they received referral and they will be in direct contact with the patient in regards to an appointment.   The patient verbalizes understanding.  Dominga Ferry, CMA

## 2017-03-06 ENCOUNTER — Other Ambulatory Visit: Payer: Self-pay | Admitting: *Deleted

## 2017-03-06 ENCOUNTER — Encounter: Payer: Self-pay | Admitting: *Deleted

## 2017-03-06 NOTE — Patient Outreach (Signed)
Third unsuccessful telephone encounter to Norma Johns, 77 year old female- follow up on current clinical status.  Pt's history includes but not limited to GERD, Anxiety, benign hypertension, hypercholesteremia, osteoporosis, DM type !!, CVA, Breast cancer.  Unable to leave a voice message as not set up yet.   Plan:  RM CM to follow up again telephonically later today.   [name].   [facility]  [phone]

## 2017-03-06 NOTE — Patient Outreach (Signed)
Fourth unsuccessful telephone encounter to Manya Silvas, 77 year old female, follow up on current clinical status (weakness, nutrition).  Pt's history includes but not limited to GERD, Anxiety, DM type II, CVA, Breast cancer.  Again unable to leave a voice message as not set up yet.    Plan:  Unable to contact letter to be sent to pt, if no response in 10 business days, to close case and notify MD.   [name].   [facility]  [phone]

## 2017-03-07 ENCOUNTER — Ambulatory Visit: Payer: Medicare Other | Attending: General Surgery | Admitting: Occupational Therapy

## 2017-03-07 ENCOUNTER — Encounter: Payer: Self-pay | Admitting: Occupational Therapy

## 2017-03-07 DIAGNOSIS — I972 Postmastectomy lymphedema syndrome: Secondary | ICD-10-CM | POA: Insufficient documentation

## 2017-03-07 DIAGNOSIS — M25612 Stiffness of left shoulder, not elsewhere classified: Secondary | ICD-10-CM | POA: Insufficient documentation

## 2017-03-07 DIAGNOSIS — M6281 Muscle weakness (generalized): Secondary | ICD-10-CM | POA: Diagnosis not present

## 2017-03-07 NOTE — Patient Instructions (Signed)
AROM and AAROM on wall for bilateral shoulder flexion, ABD, extention  10 reps  Cervical AROM in all planes  10 reps     Pect stretch  Scar massage  Compression - komprex soft sleeve cut for hand to upper arm - rosidal foam for upper arm to prevent from rolling  isotoner glove

## 2017-03-07 NOTE — Therapy (Signed)
[facility] [facility] [address] [place], [address] Phone: [phone]   Fax:  [phone]  Occupational Therapy Evaluation  Patient Details  Name: Norma Johns MRN: [id number] Date of Birth: 01/29/1941 Referring Provider: Fleet Contras   Encounter Date: 03/07/2017  OT End of Session - 03/07/17 2027    Visit Number  1    Number of Visits  12    Date for OT Re-Evaluation  04/18/17    OT Start Time  1001    OT Stop Time  1123    OT Time Calculation (min)  82 min    Activity Tolerance  Patient tolerated treatment well    Behavior During Therapy  [facility] for tasks assessed/performed       Past Medical History:  Diagnosis Date  . Anemia   . Anxiety   . Arthritis   . Asthma    WELL CONTROLLED  . Breast cancer ([place]) 2018   left breast cnacer  . Cancer ([place])    Bilat Mastectomy  . Depression   . GERD (gastroesophageal reflux disease)   . Headache    H/O  . Hyperlipidemia   . Sleep apnea    DOES NOT USE CPAP-COULD NOT TOLERATE  . Stroke [facility]) 2004    Past Surgical History:  Procedure Laterality Date  . ABDOMINAL HYSTERECTOMY    . BLADDER REPAIR    . BREAST BIOPSY Right 03/23/1991   Fibroadenoma with intramammary lymph node. Right breast, 9:00.  Marland Kitchen CHOLECYSTECTOMY    . COLONOSCOPY  2005  . MASTECTOMY MODIFIED RADICAL Left 10/01/2016   Procedure: MASTECTOMY MODIFIED RADICAL;  Surgeon: Robert Bellow, MD;  Location: [facility] ORS;  Service: General;  Laterality: Left;  . MR MRA CAROTID  02/2010   Minimal plaque formation; no significant stenosis. Sx: Syncope  . [facility]  . SENTINEL NODE BIOPSY Right 10/01/2016   Procedure: SENTINEL NODE BIOPSY;  Surgeon: Robert Bellow, MD;  Location: [facility] ORS;  Service: General;  Laterality: Right;  . SIMPLE MASTECTOMY WITH AXILLARY SENTINEL NODE BIOPSY Right 10/01/2016   Procedure: SIMPLE MASTECTOMY;  Surgeon: Robert Bellow, MD;  Location:  [facility] ORS;  Service: General;  Laterality: Right;    There were no vitals filed for this visit.  Subjective Assessment - 03/07/17 1437    Subjective   I had bilateral mastectomy in Aug and had some swelling in my arm - but then in about Oct my arm got more swollen, heavy , and my chest feels tight , and my L arm feels heavy , weak ,  and pain at times     Patient Stated Goals  I want to get this tightness in my chest better to reach over head with less pain , more strength in  L arm to help my husband , and  the swelling better     Currently in Pain?  No/denies Tighness in chest and anterior shoulders         [facility] OT Assessment - 03/07/17 0001      Assessment   Medical Diagnosis  L UE weakness and lymphedema     Referring Provider  Byrnette    Onset Date/Surgical Date  09/26/16    Hand Dominance  Right      Precautions   Precaution Comments  L UE lymphedema       Home  Environment   Lives With  Spouse  Prior Function   Vocation  Retired    Leisure  R hand dominant - take care of her husband , doing own housework and cooking , games on tablet       AROM   Right Shoulder Extension  70 Degrees    Right Shoulder Flexion  135 Degrees    Right Shoulder ABduction  140 Degrees    Left Shoulder Extension  65 Degrees    Left Shoulder Flexion  150 Degrees    Left Shoulder ABduction  150 Degrees       LYMPHEDEMA/ONCOLOGY QUESTIONNAIRE - 03/07/17 1025      Right Upper Extremity Lymphedema   15 cm Proximal to Olecranon Process  34 cm    10 cm Proximal to Olecranon Process  31 cm    Olecranon Process  27.5 cm    15 cm Proximal to Ulnar Styloid Process  25.6 cm    10 cm Proximal to Ulnar Styloid Process  22 cm    Just Proximal to Ulnar Styloid Process  18.2 cm    Across Hand at [employer]  21 cm    At [place] of 2nd Digit  7.1 cm    At [facility] of Thumb  6.9 cm      Left Upper Extremity Lymphedema   15 cm Proximal to Olecranon Process  35.3 cm    10 cm Proximal to Olecranon  Process  36 cm    Olecranon Process  27.5 cm    15 cm Proximal to Ulnar Styloid Process  26.4 cm    10 cm Proximal to Ulnar Styloid Process  24.3 cm    Just Proximal to Ulnar Styloid Process  21.4 cm    Across Hand at [employer]  20.7 cm    At [place] of 2nd Digit  7.1 cm    At [facility] of Thumb  7.2 cm        Review HEP with pt and did some soft tissue mobs in  L axilla and pect   Pt to do  AROM and AAROM on wall for bilateral shoulder flexion, ABD  10 reps  Cervical AROM in all planes  And stretch for L Upper traps and scalens  10 reps     Pect stretch  Scar massage  Compression for L Arm  - komprex soft sleeve cut for hand to upper arm - rosidal foam for upper arm to prevent from rolling  isotoner glove               OT Education - 03/07/17 2026    Education provided  Yes    Education Details  findings of eval and HEP     Person(s) Educated  Patient    Methods  Tactile cues;Verbal cues;Handout;Demonstration;Explanation    Comprehension  Verbalized understanding;Returned demonstration;Verbal cues required       OT Short Term Goals - 03/07/17 2034      OT SHORT TERM GOAL #1   Title  L UE circumference decrease by 1 cm at L wrist and forearm and 2-3 cm at upperarm to be fitted with compression garments to keep lymphedema under control     Baseline  see circumference measurements     Time  3    Period  Weeks    Status  New    Target Date  03/28/17      OT SHORT TERM GOAL #2   Title  L AROM improve with WFL with decrease symptoms of tightness ,  pull less than 3/10     Baseline  Tight and pull , heavy feeling - pull for over head shoulder ROM - 8/10     Time  3    Period  Weeks    Status  New    Target Date  03/28/17        OT Long Term Goals - 03/07/17 2036      OT LONG TERM GOAL #1   Title  Strength in L UE at elbow and shoulder increase to 4/5 to be able to perform ADL's and helping her husband with more ease     Baseline   3+/5 for elbow ext,  shoulder in all range - pull or pain /tightness 8/10     Time  6    Period  Weeks    Status  New    Target Date  04/18/17      OT LONG TERM GOAL #2   Title  Assess if compression , MLD decrease waist circumference by 5 cm     Baseline  measure 102.3 cm at belly button - pt did not had any swelling prior to AUG surgery     Time  6    Period  Weeks    Status  New    Target Date  04/18/17      OT LONG TERM GOAL #3   Title  Pt to be ind in donning, doffing and wearing correct compression to decrease lymphedema     Baseline  no garments     Time  6    Period  Weeks    Status  New    Target Date  04/18/17            Plan - 03/07/17 2028    Clinical Impression Statement  Pt present at OT eval with diagnosis of L UE weakness, lymphedema- pt show increase tightness over mastectomy scars and in axilla - muscle spasm and tightness in upper traps - decrease strength in L shoulder and  elbow an 3+/5 and 4+/5 over wrist  ,  pt show increase circumfence in L UE compare to R by 3.2 cm at wrist, 2.3 cm in forearm , 5 cm in upperarm - pt could have possible some trunkle lymphedema  she do have history of galbladder, hysterectomy and ovaries was removed -  waist measure at bellybutton 102.3 cm - pt can benefit from OT services     Occupational performance deficits (Please refer to evaluation for details):  ADL's;IADL's;Leisure    Rehab Potential  Good    OT Frequency  2x / week    OT Duration  6 weeks    OT Treatment/Interventions  Self-care/ADL training;Compression bandaging;Manual lymph drainage;Scar mobilization;Therapeutic exercise;Passive range of motion;Manual Therapy    Plan  asses progress with HEP     Clinical Decision Making  Several treatment options, min-mod task modification necessary    OT Home Exercise Plan  see pt instruction     Consulted and Agree with Plan of Care  Patient       Patient will benefit from skilled therapeutic intervention in order to improve the following  deficits and impairments:  Increased edema, Pain, Decreased scar mobility, Decreased strength, Impaired UE functional use  Visit Diagnosis: Postmastectomy lymphedema syndrome - Plan: Ot plan of care cert/re-cert  Stiffness of left shoulder, not elsewhere classified - Plan: Ot plan of care cert/re-cert  Muscle weakness - Plan: Ot plan of care cert/re-cert    Problem List Patient  Active Problem List   Diagnosis Date Noted  . Lymphedema 02/27/2017  . Shoulder weakness 02/27/2017  . Breast cancer ([place]) 10/01/2016  . Invasive lobular carcinoma of breast, stage 2, left ([place]) 09/18/2016  . Malignant neoplasm of lower-outer quadrant of left breast of female, estrogen receptor positive ([place]) 08/25/2016  . History of CVA with residual deficit 04/17/2016  . Low back pain 06/13/2015  . Anxiety 08/24/2014  . Atrophic vaginitis 08/24/2014  . Body mass index (BMI) of 29.0-29.9 in adult 08/24/2014  . Cyst of nasal sinus 08/24/2014  . Degenerative joint disease 08/24/2014  . OP (osteoporosis) 08/24/2014  . Bulging eyes 08/24/2014  . GERD (gastroesophageal reflux disease) 08/16/2014  . History of colon polyps 09/21/2008  . Chronic recurrent sinusitis 09/17/2008  . Diabetes mellitus, type 2 ([place]) 08/03/2008  . Hypercholesteremia 06/22/2008  . Cannot sleep 03/23/2008  . Allergic rhinitis 01/08/2008  . Airway hyperreactivity 01/08/2008  . Carotid artery obstruction 01/08/2008  . Clinical depression 01/08/2008  . Benign hypertension 01/08/2008    Rosalyn Gess OTR/L,CLT  03/07/2017, 8:45 PM  [facility] [address], [place], [address] Phone: [phone]   Fax:  [phone]  Name: Norma Johns MRN: [id number] Date of Birth: 1940-10-27

## 2017-03-08 ENCOUNTER — Telehealth: Payer: Self-pay

## 2017-03-08 NOTE — Telephone Encounter (Signed)
Called pt to schedule AWV with [facility]. Pt was currently busy and unable to check her calender at the time. Will call pt back next week.  -MM

## 2017-03-11 ENCOUNTER — Inpatient Hospital Stay: Payer: Medicare Other | Attending: Oncology | Admitting: *Deleted

## 2017-03-11 DIAGNOSIS — Z170 Estrogen receptor positive status [ER+]: Secondary | ICD-10-CM | POA: Insufficient documentation

## 2017-03-11 DIAGNOSIS — C50919 Malignant neoplasm of unspecified site of unspecified female breast: Secondary | ICD-10-CM

## 2017-03-11 DIAGNOSIS — C50512 Malignant neoplasm of lower-outer quadrant of left female breast: Secondary | ICD-10-CM | POA: Insufficient documentation

## 2017-03-11 LAB — COMPREHENSIVE METABOLIC PANEL
ALT: 11 U/L — ABNORMAL LOW (ref 14–54)
ANION GAP: 4 — AB (ref 5–15)
AST: 16 U/L (ref 15–41)
Albumin: 3.8 g/dL (ref 3.5–5.0)
Alkaline Phosphatase: 40 U/L (ref 38–126)
BUN: 11 mg/dL (ref 6–20)
CHLORIDE: 108 mmol/L (ref 101–111)
CO2: 27 mmol/L (ref 22–32)
CREATININE: 0.59 mg/dL (ref 0.44–1.00)
Calcium: 9.2 mg/dL (ref 8.9–10.3)
GFR calc Af Amer: >60 mL/min (ref >60)
Glucose, Bld: 127 mg/dL — ABNORMAL HIGH (ref 65–99)
POTASSIUM: 4.2 mmol/L (ref 3.5–5.1)
SODIUM: 139 mmol/L (ref 135–145)
Total Bilirubin: 0.8 mg/dL (ref 0.3–1.2)
Total Protein: 6.9 g/dL (ref 6.5–8.1)

## 2017-03-11 LAB — CBC WITH DIFFERENTIAL/PLATELET
Basophils Absolute: 0.1 10*3/uL (ref 0–0.1)
Basophils Relative: 1 %
EOS ABS: 0.1 10*3/uL (ref 0–0.7)
EOS PCT: 1 %
HCT: 39.1 % (ref 35.0–47.0)
Hemoglobin: 12.7 g/dL (ref 12.0–16.0)
LYMPHS ABS: 2.2 10*3/uL (ref 1.0–3.6)
LYMPHS PCT: 30 %
MCH: 29.2 pg (ref 26.0–34.0)
MCHC: 32.6 g/dL (ref 32.0–36.0)
MCV: 89.7 fL (ref 80.0–100.0)
MONO ABS: 0.6 10*3/uL (ref 0.2–0.9)
MONOS PCT: 8 %
Neutro Abs: 4.3 10*3/uL (ref 1.4–6.5)
Neutrophils Relative %: 60 %
Platelets: 312 10*3/uL (ref 150–440)
RBC: 4.36 MIL/uL (ref 3.80–5.20)
RDW: 14.4 % (ref 11.5–14.5)
WBC: 7.2 10*3/uL (ref 3.6–11.0)

## 2017-03-12 ENCOUNTER — Other Ambulatory Visit: Payer: Self-pay | Admitting: Oncology

## 2017-03-12 DIAGNOSIS — Z7983 Long term (current) use of bisphosphonates: Secondary | ICD-10-CM

## 2017-03-12 DIAGNOSIS — C50919 Malignant neoplasm of unspecified site of unspecified female breast: Secondary | ICD-10-CM

## 2017-03-12 NOTE — Progress Notes (Deleted)
Hematology/Oncology Follow Up Note [facility] [phone] [phone]   Patient Care Team: Rubye Beach as PCP - General (Family Medicine) Bary Castilla Forest Gleason, MD (General Surgery) Rubye Beach as Physician Assistant (Family Medicine) Lyman Speller, RN as [place] Management  REASON FOR VISIT Follow up for treatment of breast cancer.  HISTORY OF PRESENTING ILLNESS/PERTINENT ONCOLOGY HISTORY Norma Johns is a 77 y.o.afemale who has above oncology history reviewed by me today presented for follow up visit for management of   evaluation and management of breast cancer.   Patient went for routine mammogram screening in May. Abnormalities was found on the left breast. Patient was called back to have diagnostic mammogram done on 08/08/2016 which showed left breast mass at 4:00 3 cm from nipple and a 4:00 6 cm of the nipple. Indeterminate left axillary lymph nodes.  Patient underwent biopsy on 08/15/2016. Pathology showed invasive lobular carcinoma for both breast lesion, . DCIS present, lymph node biopsy is positive for metastatic lobular carcinoma  MRI of the breast on 09/10/2016 showed the having small no mass area of progressive enhancement, left breast 2 biopsy proven malignancy seen in the left breast were separated by 3.4 cm band of weakly enhancing tissue. An area of stippled discontinuous no masses enhancement extends superiorly in the left breast in 2 the upper outer quadrant spanning approximately 3.2 cm anterior/superior to the more anterior of the 2 biopsy proven malignancy. Recommend MRI guided core biopsy of the treated area of non-mass enhancement. Patient was seen and evaluated by Dr. Tollie Pizza and position was left modified radical mastectomy, right simple mastectomy with sentinel node biopsy. Patient underwent surgery on 10/01/2016. SHe tolerated the procedure well.  She declined  systemic chemotherapy or radiation therapy. She was started on Arimidex 67m daily since September. Denies any hotflush or joint pain.  Her wound has healed well and she follows up with surgeon for seroma. She does not complaints about pain. She noticed a small area of fluid collection.   INTERVAL HISTORY Patient reports feeling well. She takes Arimidex as instructed. She has some lower extremity knee pain otherwise doing well. She follows up with Dr. BTollie Pizzafor seroma drainage. Patient tells me she plans to make an appointment with him to see if her seroma need to be drained again. Denies any fever or chills. Shortness of breath. Chest pain. Abdominal pain. She wears dentures and occasionally has gum pain which resolved after she removes denture.  ROS:  Review of Systems  Constitutional: Negative for chills.  HENT:   Negative for hearing loss and lump/mass.   Eyes: Negative for eye problems and icterus.  Respiratory: Negative for chest tightness and cough.   Cardiovascular: Negative for chest pain.  Gastrointestinal: Negative for abdominal distention and abdominal pain.  Endocrine: Negative for hot flashes.  Genitourinary: Negative.  Negative for difficulty urinating and dyspareunia.   Musculoskeletal: Negative for arthralgias and back pain.       Chronic back pain  Skin: Negative.  Negative for itching and rash.  Neurological: Negative.  Negative for dizziness.  Hematological: Negative.  Negative for adenopathy. Does not bruise/bleed easily.  Psychiatric/Behavioral: The patient is not nervous/anxious.     MEDICAL HISTORY:  Past Medical History:  Diagnosis Date  . Anemia   . Anxiety   . Arthritis   . Asthma    WELL CONTROLLED  . [place]   [place]  [place]  . Depression   . GERD (gastroesophageal reflux disease)   . Headache    H/O  . Hyperlipidemia   . Sleep apnea    DOES NOT USE CPAP-COULD NOT TOLERATE  . Stroke [facility])  2004    SURGICAL HISTORY: Past Surgical History:  Procedure Laterality Date  . ABDOMINAL HYSTERECTOMY    . BLADDER REPAIR    . BREAST BIOPSY Right 03/23/1991   Fibroadenoma with intramammary lymph node. Right breast, 9:00.  Marland Kitchen CHOLECYSTECTOMY    . COLONOSCOPY  2005  . MASTECTOMY MODIFIED RADICAL Left 10/01/2016   Procedure: MASTECTOMY MODIFIED RADICAL;  Surgeon: Robert Bellow, MD;  Location: [facility] ORS;  Service: General;  Laterality: Left;  . MR MRA CAROTID  02/2010   Minimal plaque formation; no significant stenosis. Sx: Syncope  . [place] [facility]  . SENTINEL NODE BIOPSY Right 10/01/2016   Procedure: SENTINEL NODE BIOPSY;  Surgeon: Robert Bellow, MD;  Location: [facility] ORS;  Service: General;  Laterality: Right;  . SIMPLE MASTECTOMY WITH AXILLARY SENTINEL NODE BIOPSY Right 10/01/2016   Procedure: SIMPLE MASTECTOMY;  Surgeon: Robert Bellow, MD;  Location: [facility] ORS;  Service: General;  Laterality: Right;    SOCIAL HISTORY: Social History   Socioeconomic History  . Marital status: Married    Spouse name: Not on file  . Number of children: 2  . Years of education: Not on file  . Highest education level: Not on file  Social Needs  . Financial resource strain: Not on file  . Food insecurity - worry: Not on file  . Food insecurity - inability: Not on file  . Transportation needs - medical: Not on file  . Transportation needs - non-medical: Not on file  Occupational History  . Not on file  Tobacco Use  . Smoking status: Never Smoker  . Smokeless tobacco: Never Used  Substance and Sexual Activity  . Alcohol use: No  . Drug use: No  . Sexual activity: No  Other Topics Concern  . Not on file  Social History Narrative  . Not on file    FAMILY HISTORY: Family History  Problem Relation Age of Onset  . Cancer Father   . Prostate cancer Father   . Heart attack Mother   . Hypertension Mother   . CAD Mother   . Hypertension Son   . Diabetes Son     . Lung cancer Brother   . Leukemia Brother   . Lung cancer Brother   . Prostate cancer Brother   . Breast cancer Neg Hx     ALLERGIES:  is allergic to chocolate; other; sulfa antibiotics; atorvastatin; minocycline hcl; naproxen; niacin; septra [sulfamethoxazole-trimethoprim]; vioxx [rofecoxib]; and penicillins.  MEDICATIONS:  Current Outpatient Medications  Medication Sig Dispense Refill  . albuterol (PROVENTIL HFA;VENTOLIN HFA) 108 (90 Base) MCG/ACT inhaler Inhale 2 puffs into the lungs every 4 (four) hours as needed for wheezing or shortness of breath. 1 Inhaler 11  . ALPRAZolam (XANAX) 0.5 MG tablet Take 1 tablet (0.5 mg total) by mouth at bedtime as needed for anxiety. 30 tablet 5  . anastrozole (ARIMIDEX) 1 MG tablet TAKE (1) TABLET BY MOUTH EVERY DAY 30 tablet 3  . clotrimazole (LOTRIMIN) 1 % cream Apply 1 application topically 2 (two) times daily. 30 g 0  . fexofenadine (ALLEGRA) 180 MG tablet Take 180 mg by mouth 3 (three) times a week. PRN    . fluticasone (FLONASE) 50 MCG/ACT nasal spray  Place 2 sprays into both nostrils daily. 16 g 5  . Multiple Vitamin (MULTIVITAMIN) capsule Take 1 capsule by mouth daily.    . nitrofurantoin (MACRODANTIN) 100 MG capsule Take 1 capsule (100 mg total) by mouth 2 (two) times daily. 14 capsule 0  . pantoprazole (PROTONIX) 40 MG tablet Take 1 tablet (40 mg total) 2 (two) times daily before a meal by mouth. 180 tablet 1  . polyethylene glycol (MIRALAX / GLYCOLAX) packet Take 17 g by mouth daily as needed for mild constipation.    . traZODone (DESYREL) 50 MG tablet Take 0.5-1 tablets (25-50 mg total) by mouth at bedtime as needed for sleep. 30 tablet 3  . triamcinolone cream (KENALOG) 0.1 % Apply 1 application topically 2 (two) times daily. 30 g 0  . vitamin B-12 (CYANOCOBALAMIN) 1000 MCG tablet Take 1,000 mcg by mouth daily.     No current facility-administered medications for this visit.       Marland Kitchen  PHYSICAL EXAMINATION: ECOG PERFORMANCE  STATUS: 0 - Asymptomatic There were no vitals filed for this visit. There were no vitals filed for this visit. Physical Exam  Constitutional: She is oriented to person, place, and time and well-developed, well-nourished, and in no distress. No distress.  HENT:  Head: Normocephalic and atraumatic.  Eyes: Pupils are equal, round, and reactive to light. Left eye exhibits no discharge. No scleral icterus.  Neck: Normal range of motion. Neck supple. No JVD present.  Cardiovascular: Normal rate and normal heart sounds. Exam reveals no friction rub.  No murmur heard. Pulmonary/Chest: Effort normal and breath sounds normal.  Abdominal: Soft. Bowel sounds are normal. She exhibits no distension.  Musculoskeletal: Normal range of motion. She exhibits no edema.  Lymphadenopathy:    She has no cervical adenopathy.  Neurological: She is alert and oriented to person, place, and time. No cranial nerve deficit.  Skin: Skin is warm and dry.  Psychiatric: Affect normal.  Breast exam is performed in seated and lying down position. Patient is status post bilateral mastectomy.  Scars are well healed,  focal area over the sternum questionable seroma. LABORATORY DATA:  I have reviewed the data as listed Lab Results  Component Value Date   WBC 7.2 03/11/2017   HGB 12.7 03/11/2017   HCT 39.1 03/11/2017   MCV 89.7 03/11/2017   PLT 312 03/11/2017   Recent Labs    10/22/16 1018  01/08/17 1033 02/11/17 1155 03/11/17 1148  NA 136   < > 137 139 139  K 4.5   < > 3.8 3.7 4.2  CL 103   < > 106 108 108  CO2 28   < > '25 24 27  '$ GLUCOSE 127*   < > 134* 156* 127*  BUN 11   < > '10 12 11  '$ CREATININE 0.67   < > 0.58 0.66 0.59  CALCIUM 9.7   < > 9.0 9.3 9.2  GFRNONAA >60   < > >60 >60 >60  GFRAA >60   < > >60 >60 >60  PROT 6.6  --   --  6.6 6.9  ALBUMIN 4.0  --   --  3.7 3.8  AST 15  --   --  20 16  ALT 11*  --   --  13* 11*  ALKPHOS 76  --   --  45 40  BILITOT 0.8  --   --  0.6 0.8   < > = values in this  interval not displayed.    Pathology  10/01/2016 SPECIMEN SUBMITTED:  A. Breast, left, and axillary contents  B. Breast, right; simple mastectomy  C. Sentinel node 1, right  DIAGNOSIS:  A. LEFT BREAST AND AXILLARY CONTENTS; MODIFIED RADICAL MASTECTOMY:  - INVASIVE CARCINOMA WITH DUCTAL AND LOBULAR FEATURES.  - LARGEST FOCUS OF INVASIVE CARCINOMA MEASURES 10 MM.  - NINETEEN OF NINETEEN LYMPH NODES POSITIVE FOR METASTASIS (19/19).  - BIOPSY SITE CHANGES, MARKER CLIP PRESENT.  - SURGICAL MARGINS ARE NEGATIVE.  B. RIGHT BREAST; SIMPLE MASTECTOMY:  - FIBROCYSTIC CHANGE.  - NEGATIVE FOR ATYPIA AND MALIGNANCY.  C. SENTINEL LYMPH NODE 1, RIGHT; EXCISION:  - NO TUMOR SEEN IN ONE LYMPH NODE (0/1).  Surgical Pathology Cancer Case Summary   INVASIVE CARCINOMA OF THEBREAST  Procedure: modified radical mastectomy  Specimen Laterality: left  Histologic Type: invasive carcinoma with ductal and lobular features  Histologic Grade (Nottingham Histologic Score)       Glandular (Acinar)/Tubular Differentiation: 3       Nuclear Pleomorphism: 1       Mitotic Rate: 1       Overall Grade: 1  Tumor Size: 10 mm  Ductal Carcinoma in Situ (DCIS): present       Nuclear Grade: 1       Extensive intraductal component: negative  Lymphovascular Invasion: present  Treatment Effect: not applicable  Margins:       Invasive carcinoma: margins negative       Distance from closest margin: 20 mm from deep margin       DCIS: margins negative         Distance from closest margin: 20 mm from deep margin  Regional Lymph nodes:    Total # lymph nodes examined: 19    # Sentinel lymph nodes examined: 0    # Lymph nodes with macrometastasis (>2.0 mm): 18    # Lymph nodes with isolated tumor cells (<0.40m): 0    # Lymph nodes with micrometastasis (> 0.2 mm and < 2.0 mm): 1    Extranodal extension: focally present  Pathologic Stage  Classification (pTNM, AJCC 7th Edition)    TNM Descriptors: m (multiple foci of invasive carcinoma)    pTNM: mpT1b pN3a   Note: Two masses are identified. The 3rd grossly described nodule in the  upper outer quadrant is an intramammary node nearly replaced by tumor.  Biomarker testing was performed on a separate specimen and in summary:  Estrogen Receptor (ER) Status: POSITIVE, >90%    Average intensity of staining: Strong  Progesterone Receptor (PgR) Status: POSITIVE, >90%    Average intensity of staining: Moderate  HER2 (by immunohistochemistry): NEGATIVE (Score 1+)    Pathology 10/01/2016 SPECIMEN SUBMITTED:  A. Breast, left, 4:00, 3 CMFN, biopsy  B. Breast, left, 4:00, 6 CMFN, biopsy  C. Lymph node, left axilla, biopsy  DIAGNOSIS:  A. BREAST, LEFT, 4 O'CLOCK 3 CM FROM NIPPLE; ULTRASOUND-GUIDED CORE  BIOPSY:  - INVASIVE LOBULAR CARCINOMA, CLASSIC TYPE.  Size of invasive carcinoma: 6 mm in this sample  Histologic grade of invasive carcinoma: Grade 1    Glandular/tubular differentiation score: 3    Nuclear pleomorphism score: 1    Mitotic rate score: 1    Total score: 5  Ductal carcinomain situ: Present, low grade  Lymphovascular invasion: Not identified  B. BREAST, LEFT, 4 O'CLOCK 6 CM FROM NIPPLE; ULTRASOUND-GUIDED CORE  BIOPSY:  - INVASIVE LOBULAR CARCINOMA, CLASSIC TYPE.  Size of invasive carcinoma: 6 mm in this sample  Histologic grade of invasive carcinoma: Grade 1  Glandular/tubular differentiation score: 3    Nuclear pleomorphism score: 1    Mitotic rate score: 1    Total score: 5  Ductal carcinoma in situ: Not identified  Lymphovascular invasion: Not identified  C. LYMPH NODE, LEFT AXILLA; ULTRASOUND-GUIDED CORE BIOPSY:  - METASTATIC LOBULAR CARCINOMA; TUMOR SPANS 7 MM.  - NO EXTRANODAL EXTENSION IN THIS SAMPLE.   ASSESSMENT & PLAN:  Cancer Staging Malignant neoplasm of lower-outer quadrant of left breast of female,  estrogen receptor positive ([place]) Staging form: Breast, AJCC 8th Edition - Clinical stage from 10/22/2016: Stage IIIB (cT1b(m), cN3a, cM0, G1, ER: Positive, PR: Positive, HER2: Negative) - Signed by Earlie Server, MD on 10/22/2016 - Pathologic stage from 10/22/2016: pT1b(m), pN3a, cM0, ER: Positive, PR: Positive, HER2: Negative - Signed by Earlie Server, MD on 10/22/2016  No diagnosis found.  77 yo female with stage III B, ER/PR positive HER-2 negative invasive carcinoma with both ductal and lobular features. She has 19 out of 19 axillary lymph nodes positive on the left. S/p lumpectomy and declined both chemotherapy and radiation, persistent elevated CA 27.29, CT and bone scan negative for metastasis Will monitor clinically.   Tolerating Arimidex 1 mg by mouth daily. She is here for monthly evaluation prior to Zometa. Proceed with adjuvant Zometa today.  All questions were answered. The patient knows to call the clinic with any problems questions or concerns.  Return of visit:  4 weeks for evaluation before 4th dose of zometa.     Earlie Server, MD, PhD Hematology Oncology [facility] Pager- [phone] 03/12/2017

## 2017-03-13 ENCOUNTER — Other Ambulatory Visit: Payer: Self-pay | Admitting: *Deleted

## 2017-03-13 ENCOUNTER — Inpatient Hospital Stay: Payer: Medicare Other | Admitting: Oncology

## 2017-03-13 ENCOUNTER — Inpatient Hospital Stay: Payer: Medicare Other

## 2017-03-13 NOTE — Patient Outreach (Signed)
Successful telephone encounter to Norma Johns, 77 year old female - follow up on pt's call to [facility] office today, response to unable to contact letter sent, pt wants to continue services with [facility].  Pt's history includes but not limited to GERD, benign hypertension, CVA, DM, breast cancer, lymphedema.    Spoke with pt, HIPAA identifiers verified.   Pt reports something must be wrong with her phone, wants to continue to have [facility] services.  Pt reports started taking outpatient therapy last week for pain in chest, arms (lymphedema), was given a temporary sleeve but it itches, to follow up with therapy again tomorrow, to let her know.  Pt reports BP and sugar are good.   RN CM discussed with pt view in EMR recent PCP visit for hypotension orthostatic to which pt still doing it,  careful getting up, compliant with wearing compression stockings and hydration.  Pt reports  Trying to do better with nutrition.  RN CM discussed with pt doing a home visit to which pt agreed.   Plan:  As discussed with pt, plan to follow up again next week with home visit.    [name].   [place] [facility]  [phone]

## 2017-03-14 ENCOUNTER — Telehealth: Payer: Self-pay | Admitting: *Deleted

## 2017-03-14 ENCOUNTER — Ambulatory Visit: Payer: Medicare Other | Admitting: Occupational Therapy

## 2017-03-14 DIAGNOSIS — M6281 Muscle weakness (generalized): Secondary | ICD-10-CM

## 2017-03-14 DIAGNOSIS — M25612 Stiffness of left shoulder, not elsewhere classified: Secondary | ICD-10-CM | POA: Diagnosis not present

## 2017-03-14 DIAGNOSIS — I972 Postmastectomy lymphedema syndrome: Secondary | ICD-10-CM | POA: Diagnosis not present

## 2017-03-14 NOTE — Telephone Encounter (Signed)
Per Almyra Free 03/12/17 [place] message to cancel patients 03/13/17 appt for  MD/Zomeda and to schedule CT in mid March and schedule lab/MD After Scan, appt were cancelled as Rescheduled as requested.I was unable to leave  A message on her vmail to make her aware. I will try to call her again. However a  Letter was mailed out.

## 2017-03-14 NOTE — Therapy (Signed)
[facility] [address] [place], [address] Phone: [phone]   Fax:  [phone]  Occupational Therapy Treatment  Patient Details  Name: Norma Johns MRN: [id number] Date of Birth: Jul 25, 1940 Referring Provider: Fleet Contras   Encounter Date: 03/14/2017  OT End of Session - 03/14/17 1259    Visit Number  2    Number of Visits  12    Date for OT Re-Evaluation  04/18/17    OT Start Time  1151    OT Stop Time  1231    OT Time Calculation (min)  40 min    Activity Tolerance  Patient tolerated treatment well    Behavior During Therapy  [facility] for tasks assessed/performed       Past Medical History:  Diagnosis Date  . Anemia   . Anxiety   . Arthritis   . Asthma    WELL CONTROLLED  . Breast cancer ([place]) 2018   left breast cnacer  . Cancer ([place])    Bilat Mastectomy  . Depression   . GERD (gastroesophageal reflux disease)   . Headache    H/O  . Hyperlipidemia   . Sleep apnea    DOES NOT USE CPAP-COULD NOT TOLERATE  . Stroke [facility]) 2004    Past Surgical History:  Procedure Laterality Date  . ABDOMINAL HYSTERECTOMY    . BLADDER REPAIR    . BREAST BIOPSY Right 03/23/1991   Fibroadenoma with intramammary lymph node. Right breast, 9:00.  Marland Kitchen CHOLECYSTECTOMY    . COLONOSCOPY  2005  . MASTECTOMY MODIFIED RADICAL Left 10/01/2016   Procedure: MASTECTOMY MODIFIED RADICAL;  Surgeon: Robert Bellow, MD;  Location: [facility] ORS;  Service: General;  Laterality: Left;  . MR MRA CAROTID  02/2010   Minimal plaque formation; no significant stenosis. Sx: Syncope  . [place] [facility]  . SENTINEL NODE BIOPSY Right 10/01/2016   Procedure: SENTINEL NODE BIOPSY;  Surgeon: Robert Bellow, MD;  Location: [facility] ORS;  Service: General;  Laterality: Right;  . SIMPLE MASTECTOMY WITH AXILLARY SENTINEL NODE BIOPSY Right 10/01/2016   Procedure: SIMPLE MASTECTOMY;  Surgeon: Robert Bellow, MD;  Location:  [facility] ORS;  Service: General;  Laterality: Right;    There were no vitals filed for this visit.  Subjective Assessment - 03/14/17 1257    Subjective   Doing so much better - I worked it and did my exercises - the sleeve let my arm itch some - so I wore it about 2or 3 hrs at time 2-3 x day - the pull on my chest with range of motion is down from 8 to 5/10 -  those 2 places on my scar on chest is getting little more open     Patient Stated Goals  I want to get this tightness in my chest better to reach over head with less pain , more strength in  L arm to help my husband , and  the swelling better     Currently in Pain?  No/denies         [facility] OT Assessment - 03/14/17 0001      AROM   Right Shoulder Extension  70 Degrees    Right Shoulder Flexion  150 Degrees    Right Shoulder ABduction  150 Degrees    Left Shoulder Extension  65 Degrees      LYMPHEDEMA/ONCOLOGY QUESTIONNAIRE - 03/14/17 1203      Left Upper Extremity  Lymphedema   15 cm Proximal to Olecranon Process  35 cm    10 cm Proximal to Olecranon Process  36.4 cm    Olecranon Process  27.4 cm    15 cm Proximal to Ulnar Styloid Process  26 cm    10 cm Proximal to Ulnar Styloid Process  24.4 cm    Just Proximal to Ulnar Styloid Process  20.8 cm    Across Hand at [employer]  20.8 cm    At [employer] of 2nd Digit  7.4 cm    At [facility] of Thumb  7 cm       ASSess  AROM in bilateral shoulders and cervical   great progress see flowsheet-  Pt to cont with cervical stretches  And   Pt to cont with home exercises for shoulder AAROM flexion and abduction on the wall  Pect stretches on the door Scar assess - still 2 areas of 2 cm that is fold close and had fungal infection - L worse than R - cont scar massage  Ed on scar massage for bilateral mastectomy scar  Change sleeve  to D tubigrip to wear on hand to forearm most all the time except if itching or any increased issues in combination with isotoner glove  Also taught this date  self manual lymph drainage         Manual Lymph Drainage  Do manual lymph drainage once each day to help decrease swelling.  This should take you about 20-30 minutes depending on the size of your limb.  For Left Arm:  1. Hug yourself at the base of your neck and do 8 small circles; and 2 fingers behind clavicle 8 x 2. Do 5-8 semicircles at R armpit and L groin 3. Pump across chest from left to right 8 times 4. Pump down the left side of trunk from armpit to groin 8 times 5. Pump up the outside of left upper arm 8 times and inside of upper arm to outside                  8 x and then outside again 8 x 6. Pump across chest from L to R 8 times 7. Pump  down the left side of trunk from armpit to groin 8 times 14. Do 5-8 semicircles at right armpit and left groin 4-5 times 15. Repeat nr. 1  SLOW and LIGHT with only your palm NOT FINGERTIPS  If help at home can replace and or also massage over upper back from left to right when doing chest left to right. In sidelying                    OT Education - 03/14/17 1259    Education provided  Yes    Education Details  MLD and same HEP for ROM , scar massage review again - tubigrip D for hand and forearm     Person(s) Educated  Patient    Methods  Explanation;Demonstration;Tactile cues;Verbal cues    Comprehension  Verbalized understanding;Returned demonstration;Verbal cues required       OT Short Term Goals - 03/07/17 2034      OT SHORT TERM GOAL #1   Title  L UE circumference decrease by 1 cm at L wrist and forearm and 2-3 cm at upperarm to be fitted with compression garments to keep lymphedema under control     Baseline  see circumference measurements     Time  3  Period  Weeks    Status  New    Target Date  03/28/17      OT SHORT TERM GOAL #2   Title  L AROM improve with WFL with decrease symptoms of tightness , pull less than 3/10     Baseline  Tight and pull , heavy feeling - pull for over head shoulder  ROM - 8/10     Time  3    Period  Weeks    Status  New    Target Date  03/28/17        OT Long Term Goals - 03/07/17 2036      OT LONG TERM GOAL #1   Title  Strength in L UE at elbow and shoulder increase to 4/5 to be able to perform ADL's and helping her husband with more ease     Baseline   3+/5 for elbow ext, shoulder in all range - pull or pain /tightness 8/10     Time  6    Period  Weeks    Status  New    Target Date  04/18/17      OT LONG TERM GOAL #2   Title  Assess if compression , MLD decrease waist circumference by 5 cm     Baseline  measure 102.3 cm at belly button - pt did not had any swelling prior to AUG surgery     Time  6    Period  Weeks    Status  New    Target Date  04/18/17      OT LONG TERM GOAL #3   Title  Pt to be ind in donning, doffing and wearing correct compression to decrease lymphedema     Baseline  no garments     Time  6    Period  Weeks    Status  New    Target Date  04/18/17            Plan - 03/14/17 1300    Clinical Impression Statement  Pt show increase AROM in bilateral shoulders - and less pull under axilla and chest - decrease from 8/10 to 5/10 - scar still adhere but getting better - still 2 areas of 2 cm that  had fungal infection - but opening more -  L UE still heavy at times and  did not show significant decrease in circumference this date - replace with tubigrip nr 9 for forearm and hand - to see if decrease itchyness and  add and review MLD - waist decrease from 102 cm to 99.5 cm      Occupational performance deficits (Please refer to evaluation for details):  ADL's;IADL's;Leisure    Rehab Potential  Good    OT Frequency  2x / week    OT Duration  6 weeks    OT Treatment/Interventions  Self-care/ADL training;Compression bandaging;Manual lymph drainage;Scar mobilization;Therapeutic exercise;Passive range of motion;Manual Therapy    Plan  assess circumference with MLD - and compression - and scar management     Clinical  Decision Making  Several treatment options, min-mod task modification necessary    OT Home Exercise Plan  see pt instruction     Consulted and Agree with Plan of Care  Patient       Patient will benefit from skilled therapeutic intervention in order to improve the following deficits and impairments:  Increased edema, Pain, Decreased scar mobility, Decreased strength, Impaired UE functional use  Visit Diagnosis: Postmastectomy lymphedema syndrome  Stiffness  of left shoulder, not elsewhere classified  Muscle weakness    Problem List Patient Active Problem List   Diagnosis Date Noted  . Lymphedema 02/27/2017  . Shoulder weakness 02/27/2017  . Breast cancer ([place]) 10/01/2016  . Invasive lobular carcinoma of breast, stage 2, left ([place]) 09/18/2016  . Malignant neoplasm of lower-outer quadrant of left breast of female, estrogen receptor positive ([place]) 08/25/2016  . History of CVA with residual deficit 04/17/2016  . Low back pain 06/13/2015  . Anxiety 08/24/2014  . Atrophic vaginitis 08/24/2014  . Body mass index (BMI) of 29.0-29.9 in adult 08/24/2014  . Cyst of nasal sinus 08/24/2014  . Degenerative joint disease 08/24/2014  . OP (osteoporosis) 08/24/2014  . Bulging eyes 08/24/2014  . GERD (gastroesophageal reflux disease) 08/16/2014  . History of colon polyps 09/21/2008  . Chronic recurrent sinusitis 09/17/2008  . Diabetes mellitus, type 2 ([place]) 08/03/2008  . Hypercholesteremia 06/22/2008  . Cannot sleep 03/23/2008  . Allergic rhinitis 01/08/2008  . Airway hyperreactivity 01/08/2008  . Carotid artery obstruction 01/08/2008  . Clinical depression 01/08/2008  . Benign hypertension 01/08/2008    Rosalyn Gess OTR/L,CLT 03/14/2017, 7:29 PM  [facility] [address], [place], [address] Phone: [phone]   Fax:  [phone]  Name: Norma Johns MRN: [id number] Date of Birth: [birth year]

## 2017-03-14 NOTE — Patient Instructions (Signed)
Continue with same home exercises for shoulder AAROM flexion and abduction on the wall  Pect stretches on the door Continuous scar massage change did you sleeve change to D tubigrip to wear on hand to forearm most all the time except if itching or any increased issues  Also taught this date self manual lymph drainage         Manual Lymph Drainage  Do manual lymph drainage once each day to help decrease swelling.  This should take you about 20-30 minutes depending on the size of your limb.  For Left Arm:  1. Hug yourself at the base of your neck and do 8 small circles; and 2 fingers behind clavicle 8 x 2. Do 5-8 semicircles at R armpit and L groin 3. Pump across chest from left to right 8 times 4. Pump down the left side of trunk from armpit to groin 8 times 5. Pump up the outside of left upper arm 8 times and inside of upper arm to outside                  8 x and then outside again 8 x 6. Pump across chest from L to R 8 times 7. Pump  down the left side of trunk from armpit to groin 8 times 14. Do 5-8 semicircles at right armpit and left groin 4-5 times 15. Repeat nr. 1  SLOW and LIGHT with only your palm NOT FINGERTIPS  If help at home can replace and or also massage over upper back from left to right when doing chest left to right. In sidelying

## 2017-03-20 ENCOUNTER — Other Ambulatory Visit: Payer: Self-pay | Admitting: *Deleted

## 2017-03-20 NOTE — Patient Outreach (Signed)
[facility]) Care Management   03/20/2017  Norma Johns 08-04-1940 [id number]  Norma Johns is an 77 y.o. female  Subjective:  Pt reports going to  Outpatient rehab (OT) to strengthen  Arms/lymphema, was given a temporary compression stocking  for left  Arm, swelling in arm is going down, to follow up again tomorrow.   Pt  reports  tightness in chest at  times, soreness/tender to touch today coming from sites (surgical from mastectomy)previous drainage tubes were.   Pt reports was informed it was a fungal infection, taking  the cream that PCP prescribed 3 weeks ago but it is  not improving.    Objective:   Vitals:   03/20/17 1330  BP: (!) 150/80  Pulse: (!) 59  Resp: 16  SpO2: 98%    ROS  Physical Exam  Constitutional: She is oriented to person, place, and time. She appears well-developed and well-nourished.  Cardiovascular: Normal rate, regular rhythm and normal heart sounds.  Respiratory: Effort normal and breath sounds normal.  GI: Soft.  Musculoskeletal: Normal range of motion. She exhibits edema.  Edema in bilateral ankles   Neurological: She is alert and oriented to person, place, and time.  Skin: Skin is warm and dry.  Redness surrounding surgical incision sites (mid chest area) from mastectomy.    Psychiatric: She has a normal mood and affect. Her behavior is normal. Judgment and thought content normal.    Encounter Medications:   Outpatient Encounter Medications as of 03/20/2017  Medication Sig Note  . albuterol (PROVENTIL HFA;VENTOLIN HFA) 108 (90 Base) MCG/ACT inhaler Inhale 2 puffs into the lungs every 4 (four) hours as needed for wheezing or shortness of breath. 01/08/2017: As needed.   . ALPRAZolam (XANAX) 0.5 MG tablet Take 1 tablet (0.5 mg total) by mouth at bedtime as needed for anxiety. 11/19/2016: As needed.   Marland Kitchen anastrozole (ARIMIDEX) 1 MG tablet TAKE (1) TABLET BY MOUTH EVERY DAY   . clotrimazole (LOTRIMIN) 1 % cream Apply 1  application topically 2 (two) times daily.   . fexofenadine (ALLEGRA) 180 MG tablet Take 180 mg by mouth 3 (three) times a week. PRN   . fluticasone (FLONASE) 50 MCG/ACT nasal spray Place 2 sprays into both nostrils daily. 01/08/2017: As needed.   . Multiple Vitamin (MULTIVITAMIN) capsule Take 1 capsule by mouth daily.   . pantoprazole (PROTONIX) 40 MG tablet Take 1 tablet (40 mg total) 2 (two) times daily before a meal by mouth.   . vitamin B-12 (CYANOCOBALAMIN) 1000 MCG tablet Take 1,000 mcg by mouth daily. 03/20/2017: Per pt taking 500 mg daily   . nitrofurantoin (MACRODANTIN) 100 MG capsule Take 1 capsule (100 mg total) by mouth 2 (two) times daily. (Patient not taking: Reported on 03/20/2017)   . polyethylene glycol (MIRALAX / GLYCOLAX) packet Take 17 g by mouth daily as needed for mild constipation. 11/19/2016: Available if needed.   . traZODone (DESYREL) 50 MG tablet Take 0.5-1 tablets (25-50 mg total) by mouth at bedtime as needed for sleep. (Patient not taking: Reported on 03/20/2017) 01/08/2017: As needed.   . triamcinolone cream (KENALOG) 0.1 % Apply 1 application topically 2 (two) times daily. (Patient not taking: Reported on 03/20/2017)    No facility-administered encounter medications on file as of 03/20/2017.     Functional Status:   In your present state of health, do you have any difficulty performing the following activities: 11/19/2016 10/01/2016  Hearing? N N  Vision? N N  Difficulty concentrating or  making decisions? Y N  Walking or climbing stairs? Y N  Comment - -  Dressing or bathing? N N  Doing errands, shopping? N N  Preparing Food and eating ? N -  Using the Toilet? N -  In the past six months, have you accidently leaked urine? Y -  Do you have problems with loss of bowel control? N -  Managing your Medications? N -  Managing your Finances? N -  Housekeeping or managing your Housekeeping? N -  Some recent data might be hidden    Fall/Depression Screening:    Fall  Risk  01/25/2017 10/30/2016 10/25/2016  Falls in the past year? Yes No No  Number falls in past yr: 1 - -  Injury with Fall? (No Data) - -  Comment She reports that she hit her head and it was hurting for certain days but now is better - -  Risk for fall due to : - Impaired balance/gait Impaired balance/gait   PHQ 2/9 Scores 10/30/2016 10/25/2016 06/12/2016 04/05/2016 01/18/2015 09/06/2014  PHQ - 2 Score 1 1 0 0 0 0  PHQ- 9 Score - - 8 - - -    Assessment:  Pleasant 77 year old female, resides with spouse/present during  Home visit.   Pt's history includes but not limited to GERD, Benign Hypertension, Osteoporosis, Lymphedema, Breast Cancer, CVA, DM.    Skin integrity:  Redness noted around surgical incision sites from         Mastectomy, per pt sore/tender to touch/+4 on pain score.  No drainage.    Hypertension:  BP today 150/80, per pt upset about surgical sites not healing        Pt not no any BP medication.    Plan:  PCP office called during visit,  spoke with [facility] CMA - reported redness to surgical sites,               Fungal medication not working, appointment made for pt to follow up with PCP              Tomorrow, relayed to pt - agreed to follow up.  Reinforced with pt to follow up at               ED if develop fever, increased pain.             Plan to send Fenton Malling PA (PCP) home visit encounter as well as quarterly               Update letter.             As discussed with pt, plan to follow up again next week telephonically- check on status.                 THN CM Care Plan Problem One     Most Recent Value  Care Plan Problem One  Recent bilateral mastectomies - weakness  Role Documenting the Problem One  Care Management Coordinator  Care Plan for Problem One  Not Active  THN Long Term Goal   Re established weakness would improve within the next 40 days   THN Long Term Goal Start Date  12/31/16  Interventions for Problem One Long Term Goal  Reinforced with pt to  increase use of Trazadone at night for sleep   THN CM Short Term Goal #1   Nutrition would continue to improve in the next 30 days   THN CM Short Term Goal #1  Start Date  01/08/17  Interventions for Short Term Goal #1  Encouraged pt to have small frequent meals during the day/ include protein.   THN CM Short Term Goal #2   Improvement in skin integrity to surgical incision sites from mastectomy within the next 7 days   THN CM Short Term Goal #2 Start Date  03/20/17  Interventions for Short Term Goal #2  PCP office called reporting redness around surgical incision sites/ appointment made by CMA for pt to f/u with MD tomorrow.      [name].   [facility]  [phone]

## 2017-03-21 ENCOUNTER — Encounter: Payer: Self-pay | Admitting: *Deleted

## 2017-03-21 ENCOUNTER — Ambulatory Visit (INDEPENDENT_AMBULATORY_CARE_PROVIDER_SITE_OTHER): Payer: Medicare Other | Admitting: Physician Assistant

## 2017-03-21 ENCOUNTER — Encounter: Payer: Self-pay | Admitting: Physician Assistant

## 2017-03-21 ENCOUNTER — Ambulatory Visit: Payer: Medicare Other | Admitting: Occupational Therapy

## 2017-03-21 VITALS — BP 120/80 | HR 80 | Temp 97.7°F | Resp 16 | Wt 169.0 lb

## 2017-03-21 DIAGNOSIS — T8141XA Infection following a procedure, superficial incisional surgical site, initial encounter: Secondary | ICD-10-CM

## 2017-03-21 DIAGNOSIS — R21 Rash and other nonspecific skin eruption: Secondary | ICD-10-CM

## 2017-03-21 DIAGNOSIS — G479 Sleep disorder, unspecified: Secondary | ICD-10-CM | POA: Diagnosis not present

## 2017-03-21 DIAGNOSIS — F419 Anxiety disorder, unspecified: Secondary | ICD-10-CM

## 2017-03-21 DIAGNOSIS — T8149XA Infection following a procedure, other surgical site, initial encounter: Secondary | ICD-10-CM

## 2017-03-21 DIAGNOSIS — R300 Dysuria: Secondary | ICD-10-CM | POA: Diagnosis not present

## 2017-03-21 LAB — POCT URINALYSIS DIPSTICK
Appearance: NORMAL
Bilirubin, UA: NEGATIVE
Glucose, UA: NEGATIVE
KETONES UA: NEGATIVE
Leukocytes, UA: NEGATIVE
Nitrite, UA: NEGATIVE
PH UA: 6 (ref 5.0–8.0)
Protein, UA: NEGATIVE
Spec Grav, UA: 1.02 (ref 1.010–1.025)
UROBILINOGEN UA: 0.2 U/dL

## 2017-03-21 MED ORDER — CEPHALEXIN 500 MG PO CAPS: 500.0000 mg | ORAL_CAPSULE | Freq: Four times a day (QID) | ORAL | 0 refills | Status: DC

## 2017-03-21 MED ORDER — ALPRAZOLAM 0.5 MG PO TABS: 0.5000 mg | ORAL_TABLET | Freq: Every evening | ORAL | 5 refills | Status: AC | PRN

## 2017-03-21 NOTE — Progress Notes (Signed)
Patient: Norma Johns Female    DOB: [birth year]   77 y.o.   MRN: [id number] Visit Date: 03/21/2017  Today's Provider: Mar Daring, PA-C   Chief Complaint  Patient presents with  . Rash   Subjective:    HPI Patient here today C/O persistent redness over mastectomy area with unhealing surgical wound x 6 months. Patient reports using clotrimazole 1% cream and triamcinolone cream and reports no improvement. She did have a postoperative seroma that has now been resolved but what complicated the healing process.  Patient reports itching and burning with urination x' 4 days. Patient denies any blood in urine, abdominal pain or vomiting.     Allergies  Allergen Reactions  . Chocolate Shortness Of Breath  . Other Shortness Of Breath and Swelling    PT STATES ALLERGY TO "SOME TYPE OF DYE THAT WAS USED AT [facility]."  SWELLING TO ARM AND SOB.  PT CANNOT REMEMBER WHAT THE DYE WAS USED FOR.  . Sulfa Antibiotics Other (See Comments)    unknown  . Atorvastatin Other (See Comments)    Leg pain   . Minocycline Hcl Itching  . Naproxen Other (See Comments)    Indigestion  . Niacin Other (See Comments)    Felt like she was on fire  . Septra [Sulfamethoxazole-Trimethoprim] Other (See Comments)    unknown  . Vioxx [Rofecoxib] Other (See Comments)    unknown  . Penicillins Itching and Rash    Has patient had a PCN reaction causing immediate rash, facial/tongue/throat swelling, SOB or lightheadedness with hypotension: Unknown Has patient had a PCN reaction causing severe rash involving mucus membranes or skin necrosis: Unknown Has patient had a PCN reaction that required hospitalization: No Has patient had a PCN reaction occurring within the last 10 years: No If all of the above answers are "NO", then may proceed with Cephalosporin use.      Current Outpatient Medications:  .  albuterol (PROVENTIL HFA;VENTOLIN HFA) 108 (90 Base) MCG/ACT inhaler, Inhale 2 puffs into  the lungs every 4 (four) hours as needed for wheezing or shortness of breath., Disp: 1 Inhaler, Rfl: 11 .  ALPRAZolam (XANAX) 0.5 MG tablet, Take 1 tablet (0.5 mg total) by mouth at bedtime as needed for anxiety., Disp: 30 tablet, Rfl: 5 .  anastrozole (ARIMIDEX) 1 MG tablet, TAKE (1) TABLET BY MOUTH EVERY DAY, Disp: 30 tablet, Rfl: 3 .  clotrimazole (LOTRIMIN) 1 % cream, Apply 1 application topically 2 (two) times daily., Disp: 30 g, Rfl: 0 .  fexofenadine (ALLEGRA) 180 MG tablet, Take 180 mg by mouth 3 (three) times a week. PRN, Disp: , Rfl:  .  fluticasone (FLONASE) 50 MCG/ACT nasal spray, Place 2 sprays into both nostrils daily., Disp: 16 g, Rfl: 5 .  Multiple Vitamin (MULTIVITAMIN) capsule, Take 1 capsule by mouth daily., Disp: , Rfl:  .  nitrofurantoin (MACRODANTIN) 100 MG capsule, Take 1 capsule (100 mg total) by mouth 2 (two) times daily., Disp: 14 capsule, Rfl: 0 .  pantoprazole (PROTONIX) 40 MG tablet, Take 1 tablet (40 mg total) 2 (two) times daily before a meal by mouth., Disp: 180 tablet, Rfl: 1 .  polyethylene glycol (MIRALAX / GLYCOLAX) packet, Take 17 g by mouth daily as needed for mild constipation., Disp: , Rfl:  .  traZODone (DESYREL) 50 MG tablet, Take 0.5-1 tablets (25-50 mg total) by mouth at bedtime as needed for sleep., Disp: 30 tablet, Rfl: 3 .  triamcinolone cream (KENALOG) 0.1 %,  Apply 1 application topically 2 (two) times daily., Disp: 30 g, Rfl: 0 .  vitamin B-12 (CYANOCOBALAMIN) 1000 MCG tablet, Take 1,000 mcg by mouth daily., Disp: , Rfl:   Review of Systems  Constitutional: Negative.   Respiratory: Negative.   Cardiovascular: Negative.   Gastrointestinal: Negative.   Genitourinary: Positive for dysuria.  Skin: Positive for rash and wound.  Psychiatric/Behavioral: The patient is nervous/anxious.     Social History   Tobacco Use  . Smoking status: Never Smoker  . Smokeless tobacco: Never Used  Substance Use Topics  . Alcohol use: No   Objective:   BP  120/80 (BP Location: Right Arm, Patient Position: Sitting, Cuff Size: Large)   Pulse 80   Temp 97.7 F (36.5 C) (Oral)   Resp 16   Wt 169 lb (76.7 kg)   BMI 29.94 kg/m  Vitals:   03/21/17 1414  BP: 120/80  Pulse: 80  Resp: 16  Temp: 97.7 F (36.5 C)  TempSrc: Oral  Weight: 169 lb (76.7 kg)     Physical Exam  Constitutional: She appears well-developed and well-nourished. No distress.  Neck: Normal range of motion. Neck supple.  Cardiovascular: Normal rate, regular rhythm and normal heart sounds. Exam reveals no gallop and no friction rub.  No murmur heard. Pulmonary/Chest: Effort normal and breath sounds normal. No respiratory distress. She has no wheezes. She has no rales.    Abdominal: Soft. Bowel sounds are normal. She exhibits no distension. There is no tenderness. There is no CVA tenderness.  Skin: She is not diaphoretic.  Vitals reviewed.       Assessment & Plan:     1. Dysuria UA normal. Suspect vaginal dryness causing symptoms as side effect from menopause and/or the anastrozole.  - POCT Urinalysis Dipstick  2. Rash of periwound skin Will treat with keflex as below. Patient has been on Macrobid for UTI as well as doxycycline in the past 6 months without resolution of symptoms. Will treat with keflex as below to see if this may heal the area. If no improvements following completion I will refer the patient to the wound care center as there may be some foreign body or something I cannot visualize that is obstructing healing.  - cephALEXin (KEFLEX) 500 MG capsule; Take 1 capsule (500 mg total) by mouth 4 (four) times daily.  Dispense: 28 capsule; Refill: 0  3. Superficial incisional surgical site infection See above medical treatment plan. - cephALEXin (KEFLEX) 500 MG capsule; Take 1 capsule (500 mg total) by mouth 4 (four) times daily.  Dispense: 28 capsule; Refill: 0  4. Anxiety Stable. Diagnosis pulled for medication refill. Continue current medical treatment  plan. - ALPRAZolam (XANAX) 0.5 MG tablet; Take 1 tablet (0.5 mg total) by mouth at bedtime as needed for anxiety.  Dispense: 30 tablet; Refill: 5  5. Difficulty sleeping Trazodone did not work well. Patient prefers xanax for sleep as this has worked well in the past. She does not take nightly.  - ALPRAZolam (XANAX) 0.5 MG tablet; Take 1 tablet (0.5 mg total) by mouth at bedtime as needed for anxiety.  Dispense: 30 tablet; Refill: [place], PA-C  [facility]

## 2017-03-21 NOTE — Patient Instructions (Signed)
Surgical Site Infections FAQs  What is a Surgical Site Infection (SSI)?  A surgical site infection is an infection that occurs after surgery in the part of the body where the surgery took place. Most patients who have surgery do not develop an infection. However, infections develop in about 1 to 3 out of every 100 patients who have surgery.  Some of the common symptoms of a surgical site infection are:  · Redness and pain around the area where you had surgery  · Drainage of cloudy fluid from your surgical wound  · Fever    Can SSIs be treated?  Yes. Most surgical site infections can be treated with antibiotics. The antibiotic given to you depends on the bacteria (germs) causing the infection. Sometimes patients with SSIs also need another surgery to treat the infection.  What are some of the things that hospitals are doing to prevent SSIs?  To prevent SSIs, doctors, nurses, and other healthcare providers:  · Clean their hands and arms up to their elbows with an antiseptic agent just before the surgery.  · Clean their hands with soap and water or an alcohol-based hand rub before and after caring for each patient.  · May remove some of your hair immediately before your surgery using electric clippers if the hair is in the same area where the procedure will occur. They should not shave you with a razor.  · Wear special hair covers, masks, gowns, and gloves during surgery to keep the surgery area clean.  · Give you antibiotics before your surgery starts. In most cases, you should get antibiotics within 60 minutes before the surgery starts and the antibiotics should be stopped within 24 hours after surgery.  · Clean the skin at the site of your surgery with a special soap that kills germs.    What can I do to help prevent SSIs?Before your surgery:  · Tell your doctor about other medical problems you may have. Health problems such as allergies, diabetes, and obesity could affect your surgery and your treatment.  · Quit  smoking. Patients who smoke get more infections. Talk to your doctor about how you can quit before your surgery.  · Do not shave near where you will have surgery. Shaving with a razor can irritate your skin and make it easier to develop an infection.  At the time of your surgery:  · Speak up if someone tries to shave you with a razor before surgery. Ask why you need to be shaved and talk with your surgeon if you have any concerns.  · Ask if you will get antibiotics before surgery.  After your surgery:  · Make sure that your healthcare providers clean their hands before examining you, either with soap and water or an alcohol-based hand rub.  ? If you do not see your providers clean their hands, please ask them to do so.  · Family and friends who visit you should not touch the surgical wound or dressings.  · Family and friends should clean their hands with soap and water or an alcohol-based hand rub before and after visiting you. If you do not see them clean their hands, ask them to clean their hands.  What do I need to do when I go home from the hospital?  · Before you go home, your doctor or nurse should explain everything you need to know about taking care of your wound. Make sure you understand how to care for your wound before you leave   the hospital.  · Always clean your hands before and after caring for your wound.  · Before you go home, make sure you know who to contact if you have questions or problems after you get home.  · If you have any symptoms of an infection, such as redness and pain at the surgery site, drainage, or fever, call your doctor immediately.  If you have additional questions, please ask your doctor or nurse.  Developed and co-sponsored by The Society for Healthcare Epidemiology of America (SHEA); Infectious Diseases Society of America (IDSA); American Hospital Association; Association for Professionals in Infection Control and Epidemiology (APIC); Centers for Disease Control and Prevention  (CDC); and The Joint Commission.  This information is not intended to replace advice given to you by your health care provider. Make sure you discuss any questions you have with your health care provider.  Document Released: 02/17/2013 Document Revised: 01/11/2016 Document Reviewed: 07/04/2015  Elsevier Interactive Patient Education © 2018 Elsevier Inc.

## 2017-03-26 NOTE — Telephone Encounter (Signed)
Scheduled AWV for 04/02/17 @ 10:45 AM with [facility]. -MM

## 2017-03-27 ENCOUNTER — Other Ambulatory Visit: Payer: Self-pay | Admitting: *Deleted

## 2017-03-27 NOTE — Patient Outreach (Signed)
03/27/2017   Norma Johns 05-Jun-1940 [id number]  Successful telephone encounter to Norma Johns, 77 year old female for follow up on skin integrity (redness mid chest/mastectomy area),recent follow up with PCP.     Spoke with pt, HIPAA  identifiers verified.  Pt reports on mid chest area- slowly getting better, see a big improvement in healing, not draining/not as red.  Pt reports on recent visit with PCP, started on Keflex for the  nonhealing wounds, to finish antibiotic tomorrow and follow up again next Week.  Pt reports was told if redness comes back, PCP to refer to Wound care center.  Pt Reports no issues with urination (itching, burning).  Pt reports still has the chest tightness, Has an appointment with Outpatient PT tomorrow.    Plan:  As discussed with pt, plan to follow up again next week telephonically- check on skin  Integrity status.    [name].   [facility]  [phone]

## 2017-03-28 ENCOUNTER — Telehealth: Payer: Self-pay | Admitting: Physician Assistant

## 2017-03-28 ENCOUNTER — Ambulatory Visit: Payer: Medicare Other | Admitting: Occupational Therapy

## 2017-03-28 DIAGNOSIS — T8141XA Infection following a procedure, superficial incisional surgical site, initial encounter: Secondary | ICD-10-CM

## 2017-03-28 DIAGNOSIS — R21 Rash and other nonspecific skin eruption: Secondary | ICD-10-CM

## 2017-03-28 DIAGNOSIS — M6281 Muscle weakness (generalized): Secondary | ICD-10-CM | POA: Diagnosis not present

## 2017-03-28 DIAGNOSIS — I972 Postmastectomy lymphedema syndrome: Secondary | ICD-10-CM

## 2017-03-28 DIAGNOSIS — M25612 Stiffness of left shoulder, not elsewhere classified: Secondary | ICD-10-CM

## 2017-03-28 MED ORDER — CEPHALEXIN 500 MG PO CAPS: 500.0000 mg | ORAL_CAPSULE | Freq: Four times a day (QID) | ORAL | 0 refills | Status: DC

## 2017-03-28 NOTE — Telephone Encounter (Signed)
Patient reports that "maybe" is a little better.  Thanks, -Mescal Flinchbaugh

## 2017-03-28 NOTE — Telephone Encounter (Signed)
Has she noticed any change at all for the better? If not, it is not recommended to continue an antibiotic.

## 2017-03-28 NOTE — Patient Instructions (Signed)
Pt to cont with tubigrip on hand and forearm - with isotoner glove  And get to [facility] for measurements for daytime compression sleeve and glove  And Jovipak breast pad - unilateral breast pad   Cont with ROM  Hold off on MLD - because of infection - and need to maybe go on new antibiotic -

## 2017-03-28 NOTE — Therapy (Signed)
[facility] [address] [place], [address] Phone: [phone]   Fax:  [phone]  Occupational Therapy Treatment  Patient Details  Name: Norma Johns MRN: [id number] Date of Birth: 16-Aug-1940 Referring Provider: Fleet Contras   Encounter Date: 03/28/2017  OT End of Session - 03/28/17 1939    Visit Number  3    Number of Visits  12    Date for OT Re-Evaluation  04/18/17    OT Start Time  1031    OT Stop Time  1112    OT Time Calculation (min)  41 min    Activity Tolerance  Patient tolerated treatment well    Behavior During Therapy  [facility] for tasks assessed/performed       Past Medical History:  Diagnosis Date  . Anemia   . Anxiety   . Arthritis   . Asthma    WELL CONTROLLED  . Breast cancer ([place]) 2018   left breast cnacer  . Cancer ([place])    Bilat Mastectomy  . Depression   . GERD (gastroesophageal reflux disease)   . Headache    H/O  . Hyperlipidemia   . Sleep apnea    DOES NOT USE CPAP-COULD NOT TOLERATE  . Stroke [facility]) 2004    Past Surgical History:  Procedure Laterality Date  . ABDOMINAL HYSTERECTOMY    . BLADDER REPAIR    . BREAST BIOPSY Right 03/23/1991   Fibroadenoma with intramammary lymph node. Right breast, 9:00.  [facility] CHOLECYSTECTOMY    . COLONOSCOPY  2005  . MASTECTOMY MODIFIED RADICAL Left 10/01/2016   Procedure: MASTECTOMY MODIFIED RADICAL;  Surgeon: Robert Bellow, MD;  Location: [facility] ORS;  Service: General;  Laterality: Left;  . MR MRA CAROTID  02/2010   Minimal plaque formation; no significant stenosis. Sx: Syncope  . [facility]  . SENTINEL NODE BIOPSY Right 10/01/2016   Procedure: SENTINEL NODE BIOPSY;  Surgeon: Robert Bellow, MD;  Location: [facility] ORS;  Service: General;  Laterality: Right;  . SIMPLE MASTECTOMY WITH AXILLARY SENTINEL NODE BIOPSY Right 10/01/2016   Procedure: SIMPLE MASTECTOMY;  Surgeon: Robert Bellow, MD;  Location:  [facility] ORS;  Service: General;  Laterality: Right;    There were no vitals filed for this visit.  Subjective Assessment - 03/28/17 1934    Subjective   I am doing okay with my swelling in my arm - is better - I can tell - but the inside ends of my scars on chest is red and I just finish round antibiotic today - but still red and some  fever - my range of motion is better my chest just gets tigt few hours after I done my exercises     Patient Stated Goals  I want to get this tightness in my chest better to reach over head with less pain , more strength in  L arm to help my husband , and  the swelling better     Currently in Pain?  Yes    Pain Score  2     Pain Location  Chest    Pain Orientation  Anterior    Pain Descriptors / Indicators  Aching;Tender          LYMPHEDEMA/ONCOLOGY QUESTIONNAIRE - 03/28/17 1039      Left Upper Extremity Lymphedema   15 cm Proximal to Olecranon Process  34.3 cm    10 cm Proximal to Olecranon Process  35 cm    Olecranon Process  27.4 cm    15 cm Proximal to Ulnar Styloid Process  26 cm    10 cm Proximal to Ulnar Styloid Process  23 cm    Just Proximal to Ulnar Styloid Process  19.6 cm    Across Hand at [employer]  20 cm    At [place] of 2nd Digit  7.1 cm    At [facility] of Thumb  6.9 cm         Pt to cont with home exercises for shoulder AAROM flexion and abduction on the wall  Pect stretches on the door Scar assess - redness on R more than L - inside corner of scar - she is finishing KEplex this date - pt to phone MD - pt still is having fever per pt and not feeling herself      Change sleeve  to D tubigrip to wear on hand to forearm most all the time except if itching or any increased issues in combination with isotoner glove   Measurements come down greatly - pt to get measure on Monday for daytime compression and Jovipak unilateral post mastectomy pad  To hold off on  self manual lymph drainage  Because of infection              OT  Education - 03/28/17 1939    Education provided  Yes    Education Details  Hold off on MLD this date- cont with compression and get to DME for compression garments     Person(s) Educated  Patient    Methods  Explanation;Demonstration;Tactile cues;Verbal cues    Comprehension  Verbalized understanding;Returned demonstration       OT Short Term Goals - 03/07/17 2034      OT SHORT TERM GOAL #1   Title  L UE circumference decrease by 1 cm at L wrist and forearm and 2-3 cm at upperarm to be fitted with compression garments to keep lymphedema under control     Baseline  see circumference measurements     Time  3    Period  Weeks    Status  New    Target Date  03/28/17      OT SHORT TERM GOAL #2   Title  L AROM improve with WFL with decrease symptoms of tightness , pull less than 3/10     Baseline  Tight and pull , heavy feeling - pull for over head shoulder ROM - 8/10     Time  3    Period  Weeks    Status  New    Target Date  03/28/17        OT Long Term Goals - 03/07/17 2036      OT LONG TERM GOAL #1   Title  Strength in L UE at elbow and shoulder increase to 4/5 to be able to perform ADL's and helping her husband with more ease     Baseline   3+/5 for elbow ext, shoulder in all range - pull or pain /tightness 8/10     Time  6    Period  Weeks    Status  New    Target Date  04/18/17      OT LONG TERM GOAL #2   Title  Assess if compression , MLD decrease waist circumference by 5 cm     Baseline  measure 102.3 cm at belly button - pt did not had any swelling prior to  AUG surgery     Time  6    Period  Weeks    Status  New    Target Date  04/18/17      OT LONG TERM GOAL #3   Title  Pt to be ind in donning, doffing and wearing correct compression to decrease lymphedema     Baseline  no garments     Time  6    Period  Weeks    Status  New    Target Date  04/18/17            Plan - 03/28/17 1940    Clinical Impression Statement  Pt show great AROM in bilateral  shoulders but report still tightness over chest - but this date the inner scar on chest is red R more than L  - pt report that she is finishing antibiotic today but still haveing redness, tenderness  - pt to phone MD - pt circumference decongest great - pt ready to get measured for daytime compression - and then for lymphedema under arm on thoracic recommend Jovipak unilateral breast pad - and daytime compression - can get tomorrow or Monday measured -and to contact me  when redness better or when she has her compression sleeves in     Occupational performance deficits (Please refer to evaluation for details):  ADL's;IADL's;Leisure    Rehab Potential  Good    OT Frequency  1x / week    OT Duration  4 weeks    OT Treatment/Interventions  Self-care/ADL training;Compression bandaging;Manual lymph drainage;Scar mobilization;Therapeutic exercise;Passive range of motion;Manual Therapy    Plan  Check on garments if she receive it or when redness is better - and fever     Clinical Decision Making  Several treatment options, min-mod task modification necessary    OT Home Exercise Plan  see pt instruction     Consulted and Agree with Plan of Care  Patient       Patient will benefit from skilled therapeutic intervention in order to improve the following deficits and impairments:  Increased edema, Pain, Decreased scar mobility, Decreased strength, Impaired UE functional use  Visit Diagnosis: Postmastectomy lymphedema syndrome  Muscle weakness  Stiffness of left shoulder, not elsewhere classified    Problem List Patient Active Problem List   Diagnosis Date Noted  . Lymphedema 02/27/2017  . Shoulder weakness 02/27/2017  . Breast cancer ([place]) 10/01/2016  . Invasive lobular carcinoma of breast, stage 2, left ([place]) 09/18/2016  . Malignant neoplasm of lower-outer quadrant of left breast of female, estrogen receptor positive ([place]) 08/25/2016  . History of CVA with residual deficit 04/17/2016  . Low  back pain 06/13/2015  . Anxiety 08/24/2014  . Atrophic vaginitis 08/24/2014  . Body mass index (BMI) of 29.0-29.9 in adult 08/24/2014  . Cyst of nasal sinus 08/24/2014  . Degenerative joint disease 08/24/2014  . OP (osteoporosis) 08/24/2014  . Bulging eyes 08/24/2014  . GERD (gastroesophageal reflux disease) 08/16/2014  . History of colon polyps 09/21/2008  . Chronic recurrent sinusitis 09/17/2008  . Diabetes mellitus, type 2 ([place]) 08/03/2008  . Hypercholesteremia 06/22/2008  . Cannot sleep 03/23/2008  . Allergic rhinitis 01/08/2008  . Airway hyperreactivity 01/08/2008  . Carotid artery obstruction 01/08/2008  . Clinical depression 01/08/2008  . Benign hypertension 01/08/2008    Rosalyn Gess OTR/L,CLT 03/28/2017, 7:44 PM  [facility] [address], [place], [address] Phone: [phone]   Fax:  [phone]  Name:  ALIZEH MADRIL MRN: [id number] Date of Birth: [birth year]

## 2017-03-28 NOTE — Telephone Encounter (Signed)
Sent in refill

## 2017-03-28 NOTE — Telephone Encounter (Signed)
Pt contacted office for refill request on the following medications:  cephALEXin (KEFLEX) 500 MG capsule  [name]  Pt stated that her therapist saw her today and suggested she follow up with [name] for the issues with her breast that she was seen for on 03/21/17. Pt stated that she had both breast removed and that she had developed a fungus. Pt stated that it is still sore, red, and she was running a low grade fever.   Pt is asking for 3 more days of the medication be sent to [name] and since she is already scheduled for her AWV with [facility] on 04/02/17 she scheduled a F/U visit with [name] at [facility] that day. Pt was offered an appt today but requested 04/02/17 due to transportation. Please advise. Thanks TNP

## 2017-03-29 NOTE — Telephone Encounter (Signed)
Patient advised.  Thanks,  -Jaynell Castagnola 

## 2017-04-02 ENCOUNTER — Encounter: Payer: Self-pay | Admitting: Physician Assistant

## 2017-04-02 ENCOUNTER — Ambulatory Visit (INDEPENDENT_AMBULATORY_CARE_PROVIDER_SITE_OTHER): Payer: Medicare Other | Admitting: Physician Assistant

## 2017-04-02 ENCOUNTER — Ambulatory Visit (INDEPENDENT_AMBULATORY_CARE_PROVIDER_SITE_OTHER): Payer: Medicare Other

## 2017-04-02 VITALS — BP 156/78 | HR 68 | Temp 98.2°F | Ht 63.0 in | Wt 167.6 lb

## 2017-04-02 DIAGNOSIS — R21 Rash and other nonspecific skin eruption: Secondary | ICD-10-CM

## 2017-04-02 DIAGNOSIS — E119 Type 2 diabetes mellitus without complications: Secondary | ICD-10-CM | POA: Diagnosis not present

## 2017-04-02 DIAGNOSIS — Z0000 Encounter for general adult medical examination without abnormal findings: Secondary | ICD-10-CM | POA: Diagnosis not present

## 2017-04-02 LAB — POCT GLYCOSYLATED HEMOGLOBIN (HGB A1C)
Est. average glucose Bld gHb Est-mCnc: 134
Hemoglobin A1C: 6.3

## 2017-04-02 NOTE — Progress Notes (Signed)
Subjective:   Norma Johns is a 77 y.o. female who presents for Medicare Annual (Subsequent) preventive examination.  Review of Systems:  N/A  Cardiac Risk Factors include: advanced age (>40men, >80 women);dyslipidemia;hypertension;Other (see comment), Risk factor comments: stress related to husband and family illness     Objective:     Vitals: BP (!) 156/78 (BP Location: Right Arm)   Pulse 68   Temp 98.2 F (36.8 C) (Oral)   Ht 5\' 3"  (1.6 m)   Wt 167 lb 9.6 oz (76 kg)   BMI 29.69 kg/m   Body mass index is 29.69 kg/m.  Advanced Directives 04/02/2017 02/13/2017 01/09/2017 11/28/2016 11/19/2016 11/06/2016 10/30/2016  Does Patient Have a Medical Advance Directive? No No Yes No No No No  Type of Advance Directive - - Living will;Healthcare Power of Attorney - - - -  Copy of [place] in Chart? - - No - copy requested - - - -  Would patient like information on creating a medical advance directive? No - Patient declined - - No - Patient declined No - Patient declined No - Patient declined No - Patient declined    Tobacco Social History   Tobacco Use  Smoking Status Never Smoker  Smokeless Tobacco Never Used     Counseling given: Not Answered   Clinical Intake:  Pre-visit preparation completed: Yes  Pain : No/denies pain Pain Score: 0-No pain     Nutritional Status: BMI 25 -29 Overweight Nutritional Risks: None Diabetes: Yes(type 2) CBG done?: No Did pt. bring in CBG monitor from home?: No  How often do you need to have someone help you when you read instructions, pamphlets, or other written materials from your doctor or pharmacy?: 1 - Never  Interpreter Needed?: No  Information entered by :: [facility], LPN  Past Medical History:  Diagnosis Date  . Anemia   . Anxiety   . Arthritis   . Asthma    WELL CONTROLLED  . Breast cancer ([place]) 2018   left breast cnacer  . Cancer ([place])    Bilat Mastectomy  . Depression   . GERD (gastroesophageal  reflux disease)   . Headache    H/O  . Hyperlipidemia   . Sleep apnea    DOES NOT USE CPAP-COULD NOT TOLERATE  . Stroke [facility]) 2004   Past Surgical History:  Procedure Laterality Date  . ABDOMINAL HYSTERECTOMY    . BLADDER REPAIR    . BREAST BIOPSY Right 03/23/1991   Fibroadenoma with intramammary lymph node. Right breast, 9:00.  Marland Kitchen CHOLECYSTECTOMY    . COLONOSCOPY  2005  . MASTECTOMY MODIFIED RADICAL Left 10/01/2016   Procedure: MASTECTOMY MODIFIED RADICAL;  Surgeon: Robert Bellow, MD;  Location: [facility] ORS;  Service: General;  Laterality: Left;  . MR MRA CAROTID  02/2010   Minimal plaque formation; no significant stenosis. Sx: Syncope  . [place] [facility]  . SENTINEL NODE BIOPSY Right 10/01/2016   Procedure: SENTINEL NODE BIOPSY;  Surgeon: Robert Bellow, MD;  Location: [facility] ORS;  Service: General;  Laterality: Right;  . SIMPLE MASTECTOMY WITH AXILLARY SENTINEL NODE BIOPSY Right 10/01/2016   Procedure: SIMPLE MASTECTOMY;  Surgeon: Robert Bellow, MD;  Location: [facility] ORS;  Service: General;  Laterality: Right;   Family History  Problem Relation Age of Onset  . Cancer Father   . Prostate cancer Father   . Heart attack Mother   . Hypertension Mother   . CAD  Mother   . Hypertension Son   . Diabetes Son   . Lung cancer Brother   . Leukemia Brother   . Lung cancer Brother   . Prostate cancer Brother   . Breast cancer Neg Hx    Social History   Socioeconomic History  . Marital status: Married    Spouse name: None  . Number of children: 2  . Years of education: None  . Highest education level: 12th grade  Social Needs  . Financial resource strain: Very hard  . Food insecurity - worry: Never true  . Food insecurity - inability: Never true  . Transportation needs - medical: No  . Transportation needs - non-medical: No  Occupational History  . Occupation: retired  Tobacco Use  . Smoking status: Never Smoker  . Smokeless tobacco: Never Used    Substance and Sexual Activity  . Alcohol use: No  . Drug use: No  . Sexual activity: No  Other Topics Concern  . None  Social History Narrative  . None    Outpatient Encounter Medications as of 04/02/2017  Medication Sig  . albuterol (PROVENTIL HFA;VENTOLIN HFA) 108 (90 Base) MCG/ACT inhaler Inhale 2 puffs into the lungs every 4 (four) hours as needed for wheezing or shortness of breath.  . ALPRAZolam (XANAX) 0.5 MG tablet Take 1 tablet (0.5 mg total) by mouth at bedtime as needed for anxiety.  Marland Kitchen anastrozole (ARIMIDEX) 1 MG tablet TAKE (1) TABLET BY MOUTH EVERY DAY  . cephALEXin (KEFLEX) 500 MG capsule Take 1 capsule (500 mg total) by mouth 4 (four) times daily.  . clotrimazole (LOTRIMIN) 1 % cream Apply 1 application topically 2 (two) times daily.  . fexofenadine (ALLEGRA) 180 MG tablet Take 180 mg by mouth 3 (three) times a week. PRN  . fluticasone (FLONASE) 50 MCG/ACT nasal spray Place 2 sprays into both nostrils daily.  . Multiple Vitamin (MULTIVITAMIN) capsule Take 1 capsule by mouth daily.  . pantoprazole (PROTONIX) 40 MG tablet Take 1 tablet (40 mg total) 2 (two) times daily before a meal by mouth.  . vitamin B-12 (CYANOCOBALAMIN) 1000 MCG tablet Take 1,000 mcg by mouth daily.  . [DISCONTINUED] nitrofurantoin (MACRODANTIN) 100 MG capsule Take 1 capsule (100 mg total) by mouth 2 (two) times daily.  . [DISCONTINUED] polyethylene glycol (MIRALAX / GLYCOLAX) packet Take 17 g by mouth daily as needed for mild constipation.  . [DISCONTINUED] triamcinolone cream (KENALOG) 0.1 % Apply 1 application topically 2 (two) times daily.   No facility-administered encounter medications on file as of 04/02/2017.     Activities of Daily Living In your present state of health, do you have any difficulty performing the following activities: 04/02/2017 11/19/2016  Hearing? N N  Vision? N N  Difficulty concentrating or making decisions? N Y  Walking or climbing stairs? Y Y  Comment Due to knee pains.   -  Dressing or bathing? N N  Doing errands, shopping? N N  Preparing Food and eating ? N N  Using the Toilet? N N  In the past six months, have you accidently leaked urine? Y Y  Comment Occasionally, had bladder tact up years ago. Wears protection.  -  Do you have problems with loss of bowel control? N N  Managing your Medications? N N  Managing your Finances? N N  Housekeeping or managing your Housekeeping? N N  Some recent data might be hidden    Patient Care Team: Rubye Beach as PCP - General (Family Medicine)  Byrnett, Forest Gleason, MD (General Surgery) Pierzchala, [name], RN as [place] Management Earlie Server, MD as Consulting Physician (Oncology)    Assessment:   This is a routine wellness examination for Dainelle.  Exercise Activities and Dietary recommendations Current Exercise Habits: The patient does not participate in regular exercise at present, Exercise limited by: Other - see comments(too busy taking care of husband who is ill)  Goals    None      Fall Risk Fall Risk  04/02/2017 01/25/2017 10/30/2016 10/25/2016 06/12/2016  Falls in the past year? Yes Yes No No No  Number falls in past yr: 1 1 - - -  Injury with Fall? No (No Data) - - -  Comment - She reports that she hit her head and it was hurting for certain days but now is better - - -  Risk for fall due to : - - Impaired balance/gait Impaired balance/gait -  Follow up Falls prevention discussed - - - -   Is the patient's home free of loose throw rugs in walkways, pet beds, electrical cords, etc?   yes      Grab bars in the bathroom? yes      Handrails on the stairs? N/A      Adequate lighting?   yes  Timed Get Up and Go performed: N/A  Depression Screen PHQ 2/9 Scores 04/02/2017 10/30/2016 10/25/2016 06/12/2016  PHQ - 2 Score 0 1 1 0  PHQ- 9 Score - - - 8     Cognitive Function     6CIT Screen 04/02/2017 04/05/2016  What Year? 0 points 0 points  What month? 0 points 0 points   What time? 0 points 0 points  Count back from 20 0 points 0 points  Months in reverse 0 points 0 points  Repeat phrase 0 points 0 points  Total Score 0 0    Immunization History  Administered Date(s) Administered  . Influenza Split 12/10/2011  . Pneumococcal Conjugate-13 01/18/2015  . Pneumococcal Polysaccharide-23 12/10/2011    Qualifies for Shingles Vaccine? Due for Shingles vaccine. Declined my offer to administer today. Education has been provided regarding the importance of this vaccine. Pt has been advised to call her insurance company to determine her out of pocket expense. Advised she may also receive this vaccine at her local pharmacy or Health Dept. Verbalized acceptance and understanding.  Screening Tests Health Maintenance  Topic Date Due  . OPHTHALMOLOGY EXAM  01/06/1951  . TETANUS/TDAP  01/06/1962  . HEMOGLOBIN A1C  01/05/2017  . FOOT EXAM  04/05/2017  . URINE MICROALBUMIN  04/05/2017  . MAMMOGRAM  06/28/2018  . COLONOSCOPY  09/09/2023  . INFLUENZA VACCINE  Completed  . DEXA SCAN  Completed  . PNA vac Low Risk Adult  Completed    Cancer Screenings: Lung: Low Dose CT Chest recommended if Age 29-80 years, 30 pack-year currently smoking OR have quit w/in 15years. Patient does not qualify. Breast:  Up to date on Mammogram? Yes   Up to date of Bone Density/Dexa? Yes Colorectal: Up to date  Additional Screenings:  Hepatitis B/HIV/Syphillis: Pt declines today.  Hepatitis C Screening: Pt declines today.      Plan:  I have personally reviewed and addressed the Medicare Annual Wellness questionnaire and have noted the following in the patient's chart:  A. Medical and social history B. Use of alcohol, tobacco or illicit drugs  C. Current medications and supplements D. Functional ability and status E.  Nutritional status F.  Physical activity G. Advance directives H. List of other physicians I.  Hospitalizations, surgeries, and ER visits in previous 12  months J.  [place] such as hearing and vision if needed, cognitive and depression L. Referrals and appointments - none  In addition, I have reviewed and discussed with patient certain preventive protocols, quality metrics, and best practice recommendations. A written personalized care plan for preventive services as well as general preventive health recommendations were provided to patient.  See attached scanned questionnaire for additional information.   Signed,  Fabio Neighbors, LPN Nurse Health Advisor   Nurse Recommendations: Pt needs her Hgb A1c checked today. Pt declined the tetanus and shingles vaccines. Pt did not have an eye exam in the last year but plans to this year. Advised pt to have records faxed to [place] once completed.

## 2017-04-02 NOTE — Patient Instructions (Signed)
Norma Johns , Thank you for taking time to come for your Medicare Wellness Visit. I appreciate your ongoing commitment to your health goals. Please review the following plan we discussed and let me know if I can assist you in the future.   Screening recommendations/referrals: Colonoscopy: Up to date Mammogram: Up to date Bone Density: Up to date Recommended yearly ophthalmology/optometry visit for glaucoma screening and checkup Recommended yearly dental visit for hygiene and checkup  Vaccinations: Influenza vaccine: Up to date Pneumococcal vaccine: Up to date Tdap vaccine: Pt declines today.  Shingles vaccine: Pt declines today.     Advanced directives: Please bring a copy of your POA (Power of Attorney) and/or Living Will to your next appointment.   Conditions/risks identified: Fall risk prevention; Diet- recommend to start eating 3 small meals a day with 2 healthy snack in between.   Next appointment: 11:30 AM today   Preventive Care 70 Years and Older, Female Preventive care refers to lifestyle choices and visits with your health care provider that can promote health and wellness. What does preventive care include?  A yearly physical exam. This is also called an annual well check.  Dental exams once or twice a year.  Routine eye exams. Ask your health care provider how often you should have your eyes checked.  Personal lifestyle choices, including:  Daily care of your teeth and gums.  Regular physical activity.  Eating a healthy diet.  Avoiding tobacco and drug use.  Limiting alcohol use.  Practicing safe sex.  Taking low-dose aspirin every day.  Taking vitamin and mineral supplements as recommended by your health care provider. What happens during an annual well check? The services and screenings done by your health care provider during your annual well check will depend on your age, overall health, lifestyle risk factors, and family history of  disease. Counseling  Your health care provider may ask you questions about your:  Alcohol use.  Tobacco use.  Drug use.  Emotional well-being.  Home and relationship well-being.  Sexual activity.  Eating habits.  History of falls.  Memory and ability to understand (cognition).  Work and work Statistician.  Reproductive health. Screening  You may have the following tests or measurements:  Height, weight, and BMI.  Blood pressure.  Lipid and cholesterol levels. These may be checked every 5 years, or more frequently if you are over 40 years old.  Skin check.  Lung cancer screening. You may have this screening every year starting at age 39 if you have a 30-pack-year history of smoking and currently smoke or have quit within the past 15 years.  Fecal occult blood test (FOBT) of the stool. You may have this test every year starting at age 69.  Flexible sigmoidoscopy or colonoscopy. You may have a sigmoidoscopy every 5 years or a colonoscopy every 10 years starting at age 39.  Hepatitis C blood test.  Hepatitis B blood test.  Sexually transmitted disease (STD) testing.  Diabetes screening. This is done by checking your blood sugar (glucose) after you have not eaten for a while (fasting). You may have this done every 1-3 years.  Bone density scan. This is done to screen for osteoporosis. You may have this done starting at age 1.  Mammogram. This may be done every 1-2 years. Talk to your health care provider about how often you should have regular mammograms. Talk with your health care provider about your test results, treatment options, and if necessary, the need for more tests. Vaccines  Your health care provider may recommend certain vaccines, such as:  Influenza vaccine. This is recommended every year.  Tetanus, diphtheria, and acellular pertussis (Tdap, Td) vaccine. You may need a Td booster every 10 years.  Zoster vaccine. You may need this after age  74.  Pneumococcal 13-valent conjugate (PCV13) vaccine. One dose is recommended after age 19.  Pneumococcal polysaccharide (PPSV23) vaccine. One dose is recommended after age 2. Talk to your health care provider about which screenings and vaccines you need and how often you need them. This information is not intended to replace advice given to you by your health care provider. Make sure you discuss any questions you have with your health care provider. Document Released: 03/11/2015 Document Revised: 11/02/2015 Document Reviewed: 12/14/2014 Elsevier Interactive Patient Education  2017 [place] Prevention in the Home Falls can cause injuries. They can happen to people of all ages. There are many things you can do to make your home safe and to help prevent falls. What can I do on the outside of my home?  Regularly fix the edges of walkways and driveways and fix any cracks.  Remove anything that might make you trip as you walk through a door, such as a raised step or threshold.  Trim any bushes or trees on the path to your home.  Use bright outdoor lighting.  Clear any walking paths of anything that might make someone trip, such as rocks or tools.  Regularly check to see if handrails are loose or broken. Make sure that both sides of any steps have handrails.  Any raised decks and porches should have guardrails on the edges.  Have any leaves, snow, or ice cleared regularly.  Use sand or salt on walking paths during winter.  Clean up any spills in your garage right away. This includes oil or grease spills. What can I do in the bathroom?  Use night lights.  Install grab bars by the toilet and in the tub and shower. Do not use towel bars as grab bars.  Use non-skid mats or decals in the tub or shower.  If you need to sit down in the shower, use a plastic, non-slip stool.  Keep the floor dry. Clean up any water that spills on the floor as soon as it happens.  Remove  soap buildup in the tub or shower regularly.  Attach bath mats securely with double-sided non-slip rug tape.  Do not have throw rugs and other things on the floor that can make you trip. What can I do in the bedroom?  Use night lights.  Make sure that you have a light by your bed that is easy to reach.  Do not use any sheets or blankets that are too big for your bed. They should not hang down onto the floor.  Have a firm chair that has side arms. You can use this for support while you get dressed.  Do not have throw rugs and other things on the floor that can make you trip. What can I do in the kitchen?  Clean up any spills right away.  Avoid walking on wet floors.  Keep items that you use a lot in easy-to-reach places.  If you need to reach something above you, use a strong step stool that has a grab bar.  Keep electrical cords out of the way.  Do not use floor polish or wax that makes floors slippery. If you must use wax, use non-skid floor wax.  Do  not have throw rugs and other things on the floor that can make you trip. What can I do with my stairs?  Do not leave any items on the stairs.  Make sure that there are handrails on both sides of the stairs and use them. Fix handrails that are broken or loose. Make sure that handrails are as long as the stairways.  Check any carpeting to make sure that it is firmly attached to the stairs. Fix any carpet that is loose or worn.  Avoid having throw rugs at the top or bottom of the stairs. If you do have throw rugs, attach them to the floor with carpet tape.  Make sure that you have a light switch at the top of the stairs and the bottom of the stairs. If you do not have them, ask someone to add them for you. What else can I do to help prevent falls?  Wear shoes that:  Do not have high heels.  Have rubber bottoms.  Are comfortable and fit you well.  Are closed at the toe. Do not wear sandals.  If you use a  stepladder:  Make sure that it is fully opened. Do not climb a closed stepladder.  Make sure that both sides of the stepladder are locked into place.  Ask someone to hold it for you, if possible.  Clearly mark and make sure that you can see:  Any grab bars or handrails.  First and last steps.  Where the edge of each step is.  Use tools that help you move around (mobility aids) if they are needed. These include:  Canes.  Walkers.  Scooters.  Crutches.  Turn on the lights when you go into a dark area. Replace any light bulbs as soon as they burn out.  Set up your furniture so you have a clear path. Avoid moving your furniture around.  If any of your floors are uneven, fix them.  If there are any pets around you, be aware of where they are.  Review your medicines with your doctor. Some medicines can make you feel dizzy. This can increase your chance of falling. Ask your doctor what other things that you can do to help prevent falls. This information is not intended to replace advice given to you by your health care provider. Make sure you discuss any questions you have with your health care provider. Document Released: 12/09/2008 Document Revised: 07/21/2015 Document Reviewed: 03/19/2014 Elsevier Interactive Patient Education  2017 [employer].

## 2017-04-02 NOTE — Progress Notes (Signed)
Patient: Norma Johns Female    DOB: 10-19-1940   77 y.o.   MRN: [id number] Visit Date: 04/02/2017  Today's Provider: Mar Daring, PA-C   Chief Complaint  Patient presents with  . Follow-up   Subjective:    HPI  Follow up for rash  The patient was last seen for this 2 weeks ago. Changes made at last visit include start Keflex.  She reports excellent compliance with treatment. She feels that condition is Improved on and off. She is not having side effects.   ------------------------------------------------------------------------------------   Diabetes, Follow-up:   Lab Results  Component Value Date   HGBA1C 6.3 04/02/2017   HGBA1C 6.6 (H) 07/05/2016   HGBA1C 6.2 04/05/2016   GLUCOSE 127 (H) 03/11/2017   GLUCOSE 156 (H) 02/11/2017   GLUCOSE 134 (H) 01/08/2017    Last seen for for this 8 months ago.  Management since that visit includes check A1c. Current symptoms include none and have been stable.  Weight trend: stable Prior visit with dietician: no Current diet: in general, a "healthy" diet   Current exercise: housecleaning and walking  Pertinent Labs:    Component Value Date/Time   CHOL 205 (H) 07/05/2016 1239   CHOL 198 06/22/2013 1110   TRIG 105 07/05/2016 1239   TRIG 112 06/22/2013 1110   CHOLHDL 4.3 07/05/2016 1239   CREATININE 0.59 03/11/2017 1148   CREATININE 0.65 12/20/2013 0432    Wt Readings from Last 3 Encounters:  04/02/17 167 lb 9.6 oz (76 kg)  03/21/17 169 lb (76.7 kg)  03/20/17 166 lb (75.3 kg)        Allergies  Allergen Reactions  . Chocolate Shortness Of Breath  . Other Shortness Of Breath and Swelling    PT STATES ALLERGY TO "SOME TYPE OF DYE THAT WAS USED AT [facility]."  SWELLING TO ARM AND SOB.  PT CANNOT REMEMBER WHAT THE DYE WAS USED FOR.  . Sulfa Antibiotics Other (See Comments)    unknown  . Atorvastatin Other (See Comments)    Leg pain   . Minocycline Hcl Itching  . Naproxen Other (See  Comments)    Indigestion  . Niacin Other (See Comments)    Felt like she was on fire  . Septra [Sulfamethoxazole-Trimethoprim] Other (See Comments)    unknown  . Vioxx [Rofecoxib] Other (See Comments)    unknown  . Penicillins Itching and Rash    Has patient had a PCN reaction causing immediate rash, facial/tongue/throat swelling, SOB or lightheadedness with hypotension: Unknown Has patient had a PCN reaction causing severe rash involving mucus membranes or skin necrosis: Unknown Has patient had a PCN reaction that required hospitalization: No Has patient had a PCN reaction occurring within the last 10 years: No If all of the above answers are "NO", then may proceed with Cephalosporin use.      Current Outpatient Medications:  .  albuterol (PROVENTIL HFA;VENTOLIN HFA) 108 (90 Base) MCG/ACT inhaler, Inhale 2 puffs into the lungs every 4 (four) hours as needed for wheezing or shortness of breath., Disp: 1 Inhaler, Rfl: 11 .  ALPRAZolam (XANAX) 0.5 MG tablet, Take 1 tablet (0.5 mg total) by mouth at bedtime as needed for anxiety., Disp: 30 tablet, Rfl: 5 .  anastrozole (ARIMIDEX) 1 MG tablet, TAKE (1) TABLET BY MOUTH EVERY DAY, Disp: 30 tablet, Rfl: 3 .  cephALEXin (KEFLEX) 500 MG capsule, Take 1 capsule (500 mg total) by mouth 4 (four) times daily., Disp: 28 capsule,  Rfl: 0 .  clotrimazole (LOTRIMIN) 1 % cream, Apply 1 application topically 2 (two) times daily., Disp: 30 g, Rfl: 0 .  fexofenadine (ALLEGRA) 180 MG tablet, Take 180 mg by mouth 3 (three) times a week. PRN, Disp: , Rfl:  .  fluticasone (FLONASE) 50 MCG/ACT nasal spray, Place 2 sprays into both nostrils daily., Disp: 16 g, Rfl: 5 .  Multiple Vitamin (MULTIVITAMIN) capsule, Take 1 capsule by mouth daily., Disp: , Rfl:  .  pantoprazole (PROTONIX) 40 MG tablet, Take 1 tablet (40 mg total) 2 (two) times daily before a meal by mouth., Disp: 180 tablet, Rfl: 1 .  vitamin B-12 (CYANOCOBALAMIN) 1000 MCG tablet, Take 1,000 mcg by mouth  daily., Disp: , Rfl:   Review of Systems  Constitutional: Positive for appetite change, fatigue and unexpected weight change.  HENT: Positive for postnasal drip, sinus pressure and sneezing.   Respiratory: Positive for chest tightness.   Cardiovascular: Positive for leg swelling.  Gastrointestinal: Positive for abdominal distention.  Musculoskeletal: Positive for arthralgias, back pain and neck pain.  Allergic/Immunologic: Positive for food allergies.  Neurological: Positive for light-headedness, numbness and headaches.  Psychiatric/Behavioral: Positive for sleep disturbance.    Social History   Tobacco Use  . Smoking status: Never Smoker  . Smokeless tobacco: Never Used  Substance Use Topics  . Alcohol use: No   Objective:    BP  156/78 Abnormal   (BP Location: Right Arm)     Pulse  68     Temp  98.2 F (36.8 C) (Oral)     Ht  5\' 3"  (1.6 m)     Wt  167 lb 9.6 oz (76 kg)      BMI  29.69 kg/m      Physical Exam  Constitutional: She appears well-developed and well-nourished. No distress.  Neck: Normal range of motion. Neck supple.  Cardiovascular: Normal rate, regular rhythm and normal heart sounds. Exam reveals no gallop and no friction rub.  No murmur heard. Pulmonary/Chest: Effort normal and breath sounds normal. No respiratory distress. She has no wheezes. She has no rales.    Skin: She is not diaphoretic.  Vitals reviewed.       Assessment & Plan:     1. Rash of periwound skin Culture obtained today. Advised to discontinue keflex since there has been no changes to surrounding skin or size of fluid pocket noted. If wound culture is negative may consider referral to wound clinic for further evaluation to see why surgical incision is not healing. She does also have an appt with Dr. Bary Castilla on 05/02/17.  - Wound culture  2. Type 2 diabetes mellitus without complication, without long-term current use of insulin (HCC) A1c is doing well. Down to 6.3 from  6.6. Continue current lifestyle modifications.  - POCT glycosylated hemoglobin (Hb A1C)       Mar Daring, PA-C  [facility]

## 2017-04-03 ENCOUNTER — Ambulatory Visit: Payer: Self-pay | Admitting: *Deleted

## 2017-04-05 ENCOUNTER — Other Ambulatory Visit: Payer: Self-pay | Admitting: Physician Assistant

## 2017-04-05 ENCOUNTER — Ambulatory Visit: Payer: Self-pay | Admitting: *Deleted

## 2017-04-05 DIAGNOSIS — Z22322 Carrier or suspected carrier of Methicillin resistant Staphylococcus aureus: Secondary | ICD-10-CM | POA: Insufficient documentation

## 2017-04-05 LAB — SPECIMEN STATUS REPORT

## 2017-04-05 MED ORDER — CLINDAMYCIN HCL 300 MG PO CAPS: 300.0000 mg | ORAL_CAPSULE | Freq: Three times a day (TID) | ORAL | 0 refills | Status: DC

## 2017-04-05 NOTE — Progress Notes (Signed)
Wound culture positive for MRSA. Will send clindamycin.

## 2017-04-08 ENCOUNTER — Telehealth: Payer: Self-pay | Admitting: Physician Assistant

## 2017-04-08 LAB — WOUND CULTURE: ORGANISM ID, BACTERIA: NONE SEEN

## 2017-04-08 NOTE — Telephone Encounter (Signed)
-----   Message from Mar Daring, PA-C sent at 04/05/2017  1:43 PM EST ----- Wound culture is positive for MRSA. We can try clindamycin as it shows sensitivity to it pre culture results. If this is ineffective I will need to refer you to the wound center for possible IV antibiotics to clear it. Clindamycin will most likely cause some GI upset so make sure to take with food.

## 2017-04-08 NOTE — Telephone Encounter (Signed)
Patient advised as below. Patient verbalizes understanding and is in agreement with treatment plan.  

## 2017-04-08 NOTE — Telephone Encounter (Signed)
Pt is calling to get lab results from her culture on 04/02/17. Please advise. Thanks TNP

## 2017-04-09 ENCOUNTER — Telehealth: Payer: Self-pay

## 2017-04-09 DIAGNOSIS — C50912 Malignant neoplasm of unspecified site of left female breast: Secondary | ICD-10-CM

## 2017-04-09 DIAGNOSIS — R21 Rash and other nonspecific skin eruption: Secondary | ICD-10-CM

## 2017-04-09 DIAGNOSIS — Z22322 Carrier or suspected carrier of Methicillin resistant Staphylococcus aureus: Secondary | ICD-10-CM

## 2017-04-09 DIAGNOSIS — L988 Other specified disorders of the skin and subcutaneous tissue: Secondary | ICD-10-CM

## 2017-04-09 DIAGNOSIS — T8189XS Other complications of procedures, not elsewhere classified, sequela: Secondary | ICD-10-CM

## 2017-04-09 DIAGNOSIS — Z170 Estrogen receptor positive status [ER+]: Secondary | ICD-10-CM

## 2017-04-09 DIAGNOSIS — R238 Other skin changes: Secondary | ICD-10-CM

## 2017-04-09 DIAGNOSIS — Z9013 Acquired absence of bilateral breasts and nipples: Secondary | ICD-10-CM

## 2017-04-09 NOTE — Telephone Encounter (Signed)
Norma Johns is requesting to be referred to [name]. Patient reports Clindamycin has too many side effects.

## 2017-04-09 NOTE — Telephone Encounter (Signed)
Referral placed.

## 2017-04-15 ENCOUNTER — Encounter: Payer: Medicare Other | Attending: Physician Assistant | Admitting: Physician Assistant

## 2017-04-15 ENCOUNTER — Telehealth: Payer: Self-pay | Admitting: Physician Assistant

## 2017-04-15 DIAGNOSIS — I10 Essential (primary) hypertension: Secondary | ICD-10-CM | POA: Diagnosis not present

## 2017-04-15 DIAGNOSIS — G4730 Sleep apnea, unspecified: Secondary | ICD-10-CM | POA: Diagnosis not present

## 2017-04-15 DIAGNOSIS — Z882 Allergy status to sulfonamides status: Secondary | ICD-10-CM | POA: Diagnosis not present

## 2017-04-15 DIAGNOSIS — E11610 Type 2 diabetes mellitus with diabetic neuropathic arthropathy: Secondary | ICD-10-CM | POA: Diagnosis not present

## 2017-04-15 DIAGNOSIS — Z9013 Acquired absence of bilateral breasts and nipples: Secondary | ICD-10-CM | POA: Insufficient documentation

## 2017-04-15 DIAGNOSIS — L98492 Non-pressure chronic ulcer of skin of other sites with fat layer exposed: Secondary | ICD-10-CM | POA: Insufficient documentation

## 2017-04-15 DIAGNOSIS — J45909 Unspecified asthma, uncomplicated: Secondary | ICD-10-CM | POA: Insufficient documentation

## 2017-04-15 DIAGNOSIS — Z880 Allergy status to penicillin: Secondary | ICD-10-CM | POA: Insufficient documentation

## 2017-04-15 DIAGNOSIS — E669 Obesity, unspecified: Secondary | ICD-10-CM | POA: Insufficient documentation

## 2017-04-15 DIAGNOSIS — R0789 Other chest pain: Secondary | ICD-10-CM | POA: Diagnosis not present

## 2017-04-15 DIAGNOSIS — M7989 Other specified soft tissue disorders: Secondary | ICD-10-CM

## 2017-04-15 DIAGNOSIS — Z6829 Body mass index (BMI) 29.0-29.9, adult: Secondary | ICD-10-CM | POA: Insufficient documentation

## 2017-04-15 DIAGNOSIS — E11622 Type 2 diabetes mellitus with other skin ulcer: Secondary | ICD-10-CM | POA: Diagnosis not present

## 2017-04-15 DIAGNOSIS — M1990 Unspecified osteoarthritis, unspecified site: Secondary | ICD-10-CM | POA: Insufficient documentation

## 2017-04-15 DIAGNOSIS — Z853 Personal history of malignant neoplasm of breast: Secondary | ICD-10-CM | POA: Insufficient documentation

## 2017-04-15 DIAGNOSIS — Z886 Allergy status to analgesic agent status: Secondary | ICD-10-CM | POA: Diagnosis not present

## 2017-04-15 NOTE — Telephone Encounter (Signed)
Mr. Norma Johns seen Terril today and the wounds on her chest are healed and closed.   The Lotrimin did its job as far as Engineer, petroleum. All looks normal.  However she is still experiencing tremendous pain in the chest area to touch.  He is wondering if it is nerve pain/neuropathy secondary to surgery.  However he would like for you to followup w/ her and maybe do some type of imaging to see if it is an underlying abscess/MRSA before referring her elsewhere.   I told Mr. Norma Johns that [name] was out of the office today and it may be Wed. Before she can reply and he said that was fine.    Please call Mr. Norma Johns to discuss this.    [phone]

## 2017-04-17 NOTE — Progress Notes (Signed)
ILLA, ENLOW ([id number]) Visit Report for 04/15/2017 Allergy List Details Patient Name: Norma Johns, Norma Johns. Date of Service: 04/15/2017 10:30 AM Medical Record Number: [id number] Patient Account Number: [id number] Date of Birth/Sex: March 05, 1940 (77 y.o. Female) Treating RN: Carolyne Fiscal, Debi Primary Care Ermine Spofford: Fenton Malling Other Clinician: Referring Mikhai Bienvenue: Fenton Malling Treating Tilak Oakley/Extender: Melburn Hake, HOYT Weeks in Treatment: 0 Allergies Active Allergies chocolate dye (unknown) used at hospital sulfa atorvastatin minocycline HCl naproxen niacin Vioxx penicillin Allergy Notes Electronic Signature(s) Signed: 04/16/2017 4:06:48 PM By: Alric Quan Entered By: Alric Quan on 04/15/2017 10:50:32 Norma Johns ([id number]) -------------------------------------------------------------------------------- [place] Details Patient Name: Norma Johns. Date of Service: 04/15/2017 10:30 AM Medical Record Number: [id number] Patient Account Number: [id number] Date of Birth/Sex: November 30, 1940 (77 y.o. Female) Treating RN: Carolyne Fiscal, Debi Primary Care Kaaliyah Kita: Fenton Malling Other Clinician: Referring Daveena Elmore: Fenton Malling Treating Saja Bartolini/Extender: Melburn Hake, HOYT Weeks in Treatment: 0 Visit Information Patient Arrived: Ambulatory Arrival Time: 10:45 Accompanied By: self Transfer Assistance: None Patient Identification Verified: Yes Secondary Verification Process Completed: Yes Patient Requires Transmission-Based No Precautions: Patient Has Alerts: Yes Patient Alerts: DM II A1C 6.3 Electronic Signature(s) Signed: 04/16/2017 4:06:48 PM By: Alric Quan Entered By: Alric Quan on 04/15/2017 10:47:39 Norma Johns, Norma Johns ([id number]) -------------------------------------------------------------------------------- Clinic Level of Care Assessment Details Patient Name: Norma Johns Date of Service: 04/15/2017 10:30  AM Medical Record Number: [id number] Patient Account Number: [id number] Date of Birth/Sex: 1940-12-24 (77 y.o. Female) Treating RN: Ahmed Prima Primary Care Sherry Rogus: Fenton Malling Other Clinician: Referring Jakeem Grape: Fenton Malling Treating Myrl Bynum/Extender: Melburn Hake, HOYT Weeks in Treatment: 0 Clinic Level of Care Assessment Items TOOL 2 Quantity Score X - Use when only an EandM is performed on the INITIAL visit 1 0 ASSESSMENTS - Nursing Assessment / Reassessment X - General Physical Exam (combine w/ comprehensive assessment (listed just below) when 1 20 performed on new pt. evals) X- 1 25 Comprehensive Assessment (HX, ROS, Risk Assessments, Wounds Hx, etc.) ASSESSMENTS - Wound and Skin Assessment / Reassessment []  - Simple Wound Assessment / Reassessment - one wound 0 []  - 0 Complex Wound Assessment / Reassessment - multiple wounds []  - 0 Dermatologic / Skin Assessment (not related to wound area) ASSESSMENTS - Ostomy and/or Continence Assessment and Care []  - Incontinence Assessment and Management 0 []  - 0 Ostomy Care Assessment and Management (repouching, etc.) PROCESS - Coordination of Care X - Simple Patient / Family Education for ongoing care 1 15 []  - 0 Complex (extensive) Patient / Family Education for ongoing care []  - 0 Staff obtains Programmer, systems, Records, Test Results / Process Orders []  - 0 Staff telephones HHA, Nursing Homes / Clarify orders / etc []  - 0 Routine Transfer to another Facility (non-emergent condition) []  - 0 Routine Hospital Admission (non-emergent condition) X- 1 15 New Admissions / Biomedical engineer / Ordering NPWT, Apligraf, etc. []  - 0 Emergency Hospital Admission (emergent condition) X- 1 10 Simple Discharge Coordination []  - 0 Complex (extensive) Discharge Coordination PROCESS - Special Needs []  - Pediatric / Minor Patient Management 0 []  - 0 Isolation Patient Management Norma Johns, Norma B. ([id number]) []  -  0 Hearing / Language / Visual special needs []  - 0 Assessment of Community assistance (transportation, D/C planning, etc.) []  - 0 Additional assistance / Altered mentation []  - 0 Support Surface(s) Assessment (bed, cushion, seat, etc.) INTERVENTIONS - Wound Cleansing / Measurement X - Wound Imaging (photographs - any number of wounds) 1 5 []  - 0 Wound Tracing (instead of photographs) []  - 0 Simple Wound Measurement -  one wound []  - 0 Complex Wound Measurement - multiple wounds []  - 0 Simple Wound Cleansing - one wound []  - 0 Complex Wound Cleansing - multiple wounds INTERVENTIONS - Wound Dressings []  - Small Wound Dressing one or multiple wounds 0 []  - 0 Medium Wound Dressing one or multiple wounds []  - 0 Large Wound Dressing one or multiple wounds []  - 0 Application of Medications - injection INTERVENTIONS - Miscellaneous []  - External ear exam 0 []  - 0 Specimen Collection (cultures, biopsies, blood, body fluids, etc.) []  - 0 Specimen(s) / Culture(s) sent or taken to Lab for analysis []  - 0 Patient Transfer (multiple staff / Civil Service fast streamer / Similar devices) []  - 0 Simple Staple / Suture removal (25 or less) []  - 0 Complex Staple / Suture removal (26 or more) []  - 0 Hypo / Hyperglycemic Management (close monitor of Blood Glucose) []  - 0 Ankle / Brachial Index (ABI) - do not check if billed separately Has the patient been seen at the hospital within the last three years: Yes Total Score: 90 Level Of Care: New/Established - Level 3 Electronic Signature(s) Signed: 04/16/2017 4:06:48 PM By: Alric Quan Entered By: Alric Quan on 04/15/2017 16:13:56 Norma Johns ([id number]) -------------------------------------------------------------------------------- Encounter Discharge Information Details Patient Name: Norma Silvas B. Date of Service: 04/15/2017 10:30 AM Medical Record Number: [id number] Patient Account Number: [id number] Date of Birth/Sex: 1940-03-02  (77 y.o. Female) Treating RN: Carolyne Fiscal, Debi Primary Care Britani Beattie: Fenton Malling Other Clinician: Referring Meiya Wisler: Fenton Malling Treating Katalyna Socarras/Extender: Melburn Hake, HOYT Weeks in Treatment: 0 Encounter Discharge Information Items Discharge Pain Level: 0 Discharge Condition: Stable Ambulatory Status: Ambulatory Discharge Destination: Home Transportation: Private Auto Accompanied By: self Schedule Follow-up Appointment: No Medication Reconciliation completed and No provided to Patient/Care Sarinah Doetsch: Provided on Clinical Summary of Care: 04/15/2017 Form Type Recipient Paper Patient MP Electronic Signature(s) Signed: 04/15/2017 4:58:43 PM By: Ruthine Dose Entered By: Ruthine Dose on 04/15/2017 11:37:23 Norma Johns ([id number]) -------------------------------------------------------------------------------- Lower Extremity Assessment Details Patient Name: Norma Silvas B. Date of Service: 04/15/2017 10:30 AM Medical Record Number: [id number] Patient Account Number: [id number] Date of Birth/Sex: 28-Apr-1940 (77 y.o. Female) Treating RN: Carolyne Fiscal, Debi Primary Care Trejon Duford: Fenton Malling Other Clinician: Referring Areyana Leoni: Fenton Malling Treating [place] Se[place]ore/Extender: Melburn Hake, HOYT Weeks in Treatment: 0 Electronic Signature(s) Signed: 04/16/2017 4:06:48 PM By: Alric Quan Entered By: Alric Quan on 04/15/2017 11:02:04 Norma Johns ([id number]) -------------------------------------------------------------------------------- Multi Wound Chart Details Patient Name: Norma Johns Date of Service: 04/15/2017 10:30 AM Medical Record Number: [id number] Patient Account Number: [id number] Date of Birth/Sex: 18-Oct-1940 (77 y.o. Female) Treating RN: Ahmed Prima Primary Care Jasir Rother: Fenton Malling Other Clinician: Referring Kamalei Roeder: Fenton Malling Treating Jowanna Loeffler/Extender: Melburn Hake, HOYT Weeks in Treatment: 0 Vital  Signs Height(in): 63 Pulse(bpm): 60 Weight(lbs): 166.8 Blood Pressure(mmHg): 148/71 Body Mass Index(BMI): 30 Temperature(F): 97.5 Respiratory Rate 18 (breaths/min): Wound Assessments Treatment Notes Electronic Signature(s) Signed: 04/16/2017 4:06:48 PM By: Alric Quan Entered By: Alric Quan on 04/15/2017 11:23:26 Norma Johns ([id number]) -------------------------------------------------------------------------------- [place] Details Patient Name: Norma Johns Date of Service: 04/15/2017 10:30 AM Medical Record Number: [id number] Patient Account Number: [id number] Date of Birth/Sex: Feb 05, 1941 (77 y.o. Female) Treating RN: Carolyne Fiscal, Debi Primary Care Oreoluwa Gilmer: Fenton Malling Other Clinician: Referring Raizel Wesolowski: Fenton Malling Treating Kiva Norland/Extender: Melburn Hake, HOYT Weeks in Treatment: 0 Active Inactive Electronic Signature(s) Signed: 04/16/2017 4:06:48 PM By: Alric Quan Entered By: Alric Quan on 04/15/2017 11:23:16 Norma Johns ([id number]) -------------------------------------------------------------------------------- Pain Assessment Details Patient Name: Norma Johns Date of Service:  04/15/2017 10:30 AM Medical Record Number: [id number] Patient Account Number: [id number] Date of Birth/Sex: 07-26-1940 (76 y.o. Female) Treating RN: Carolyne Fiscal, Debi Primary Care Quorra Rosene: Fenton Malling Other Clinician: Referring Hisako Bugh: Fenton Malling Treating Nova Evett/Extender: Melburn Hake, HOYT Weeks in Treatment: 0 Active Problems Location of Pain Severity and Description of Pain Patient Has Paino No Site Locations Pain Management and Medication Current Pain Management: Electronic Signature(s) Signed: 04/16/2017 4:06:48 PM By: Alric Quan Entered By: Alric Quan on 04/15/2017 10:47:48 Norma Johns  ([id number]) -------------------------------------------------------------------------------- Patient/Caregiver Education Details Patient Name: Norma Johns Date of Service: 04/15/2017 10:30 AM Medical Record Number: [id number] Patient Account Number: [id number] Date of Birth/Gender: 07/28/1940 (76 y.o. Female) Treating RN: Ahmed Prima Primary Care Physician: Fenton Malling Other Clinician: Referring Physician: Fenton Malling Treating Physician/Extender: Melburn Hake, HOYT Weeks in Treatment: 0 Education Assessment Education Provided To: Patient Education Topics Provided Wound/Skin Impairment: Handouts: Other: Please call our office if you have any questions or concerns. Methods: Demonstration, Explain/Verbal Responses: State content correctly Electronic Signature(s) Signed: 04/16/2017 4:06:48 PM By: Alric Quan Entered By: Alric Quan on 04/15/2017 11:31:16 Norma Johns ([id number]) -------------------------------------------------------------------------------- [place] Details Patient Name: Norma Johns Date of Service: 04/15/2017 10:30 AM Medical Record Number: [id number] Patient Account Number: [id number] Date of Birth/Sex: [birth year] (77 y.o. Female) Treating RN: Carolyne Fiscal, Debi Primary Care Timarie Labell: Fenton Malling Other Clinician: Referring Arias Weinert: Fenton Malling Treating Laquetta Racey/Extender: Melburn Hake, HOYT Weeks in Treatment: 0 Vital Signs Time Taken: 10:47 Temperature (F): 97.5 Height (in): 63 Pulse (bpm): 60 Source: Stated Respiratory Rate (breaths/min): 18 Weight (lbs): 166.8 Blood Pressure (mmHg): 148/71 Source: Measured Reference Range: 80 - 120 mg / dl Body Mass Index (BMI): 29.5 Electronic Signature(s) Signed: 04/16/2017 4:06:48 PM By: Alric Quan Entered By: Alric Quan on 04/15/2017 10:48:18

## 2017-04-17 NOTE — Telephone Encounter (Signed)
US ordered

## 2017-04-17 NOTE — Telephone Encounter (Signed)
Patient advised as below. Patient reports she will try [place], and asked to be scheduled next week if possible. Patient reports she will not be able to do this week.

## 2017-04-17 NOTE — Progress Notes (Signed)
SHERMIKA, BALTHASER ([id number]) Visit Report for 04/15/2017 Chief Complaint Document Details Patient Name: Norma Johns, Norma Johns. Date of Service: 04/15/2017 10:30 AM Medical Record Number: [id number] Patient Account Number: [id number] Date of Birth/Sex: 07/17/1940 (77 y.o. Female) Treating RN: Carolyne Fiscal, Debi Primary Care Provider: Fenton Malling Other Clinician: Referring Provider: Fenton Malling Treating Provider/Extender: Melburn Hake, HOYT Weeks in Treatment: 0 Information Obtained from: Patient Chief Complaint Bilateral mastectomy August 2018 with open ulcer at the JP drain sites Electronic Signature(s) Signed: 04/15/2017 5:42:41 PM By: Worthy Keeler PA-C Entered By: Worthy Keeler on 04/15/2017 13:14:09 Norma Johns ([id number]) -------------------------------------------------------------------------------- HPI Details Patient Name: Norma Johns Date of Service: 04/15/2017 10:30 AM Medical Record Number: [id number] Patient Account Number: [id number] Date of Birth/Sex: 02-07-1941 (77 y.o. Female) Treating RN: Ahmed Prima Primary Care Provider: Fenton Malling Other Clinician: Referring Provider: Fenton Malling Treating Provider/Extender: Melburn Hake, HOYT Weeks in Treatment: 0 History of Present Illness Associated Signs and Symptoms: Patient has a history of diabetes mellitus type II which is controlled with diet at this point she also was recently diagnosed with MRSA in regard to the ulcers on her chest for the JP drains were removed. HPI Description: 04/15/17 on evaluation today patient is seen initially concerning issues that she has been having with continuing open ulcerations on the anterior chest wall at the site of the JP drain that replaced following her bilateral mastectomy. She also had lymph nodes removed from the axillary region bilaterally. With that being said she tells me that she is having fairly significant pain which is rated in the high 7-9/10  range with touch over the chest wall. This is true even which is very light touch. Patient feels like "there has to be something wrong in there". She has not had any CT scans of chest following surgery that she is aware of nor that I could find in the system. With that being said she tells me that it has been offered for her to potentially see a another surgeon for a second opinion but at this point she has not opted nor wanted to really do that she definitely does not want any further surgery she feels like that this one has been much more significant and severe than it should've been. She does see Lillia Abed who is a Librarian, academic at [facility] family practice that is she referred her to our clinic for evaluation. Prior to referral she was diagnosed by culture of having MRSA and was placed on Keflex as well is clindamycin along with a topical antifungal which seems to have improved things for her. She does have a history of diabetes mellitus type II with a hemoglobin A-1 C of 6.3 most recently. Otherwise she has no significant history she does seem to be extremely frustrated with her current situation. Electronic Signature(s) Signed: 04/15/2017 5:42:41 PM By: Worthy Keeler PA-C Entered By: Worthy Keeler on 04/15/2017 13:18:55 Norma Johns ([id number]) -------------------------------------------------------------------------------- Physical Exam Details Patient Name: Norma Johns Date of Service: 04/15/2017 10:30 AM Medical Record Number: [id number] Patient Account Number: [id number] Date of Birth/Sex: 1940/04/19 (77 y.o. Female) Treating RN: Ahmed Prima Primary Care Provider: Fenton Malling Other Clinician: Referring Provider: Fenton Malling Treating Provider/Extender: Melburn Hake, HOYT Weeks in Treatment: 0 Constitutional patient is hypertensive.. pulse regular and within target range for patient.Marland Kitchen respirations regular, non-labored and within target range  for patient.Marland Kitchen temperature within target range for patient.. Obese and well-hydrated in no acute distress. Eyes conjunctiva clear no eyelid edema noted.  pupils equal round and reactive to light and accommodation. Ears, Nose, Mouth, and Throat no gross abnormality of ear auricles or external auditory canals. normal hearing noted during conversation. mucus membranes moist. Respiratory normal breathing without difficulty. clear to auscultation bilaterally. Cardiovascular regular rate and rhythm with normal S1, S2. no clubbing, cyanosis, significant edema, <3 sec cap refill. Gastrointestinal (GI) soft, non-tender, non-distended, +BS. no ventral hernia noted. Musculoskeletal normal gait and posture. no significant deformity or arthritic changes, no loss or range of motion, no clubbing. Psychiatric this patient is able to make decisions and demonstrates good insight into disease process. Alert and Oriented x 3. pleasant and cooperative. Notes On evaluation today patient did not appear to have any open ulcers in regard to the surgical sites on her chest. I did see the two areas for the JP drains were placed but again there is no evidence of an opening at this point she has not experienced any drainage she tells me over the past week. I do believe this has completely closed at this point. There's also no evidence of fungal infection although it does sound like she had both openings as well is an infection noted when she last saw her primary care provider Lillia Abed PA-C. Unfortunately she does have extremely severe pain even to light touch over the chest wall area which is somewhat reminiscent of neuropathic/nerve pain. With that being said she had not really had any CT scan or other evaluation at this point but I'm aware of any way to confirm there is no physical reason for the pain she's experiencing. Electronic Signature(s) Signed: 04/15/2017 5:42:41 PM By: Worthy Keeler PA-C Entered By:  Worthy Keeler on 04/15/2017 17:27:16 Norma Johns ([id number]) -------------------------------------------------------------------------------- Physician Orders Details Patient Name: Norma Johns Date of Service: 04/15/2017 10:30 AM Medical Record Number: [id number] Patient Account Number: [id number] Date of Birth/Sex: 1940/06/23 (77 y.o. Female) Treating RN: Carolyne Fiscal, Debi Primary Care Provider: Fenton Malling Other Clinician: Referring Provider: Fenton Malling Treating Provider/Extender: Melburn Hake, HOYT Weeks in Treatment: 0 Verbal / Phone Orders: Yes Clinician: Carolyne Fiscal, Debi Read Back and Verified: Yes Diagnosis Coding ICD-10 Coding Code Description Unspecified open wound of unspecified front wall of thorax without penetration into thoracic cavity, initial S21.109A encounter L98.492 Non-pressure chronic ulcer of skin of other sites with fat layer exposed E11.622 Type 2 diabetes mellitus with other skin ulcer I10 Essential (primary) hypertension A49.02 Methicillin resistant Staphylococcus aureus infection, unspecified site Discharge From [facility] Services o Discharge from [place] - We will consult with your primary care physician and they will contact you. Please call our office if you have any questions or concerns. Electronic Signature(s) Signed: 04/15/2017 5:42:41 PM By: Worthy Keeler PA-C Signed: 04/16/2017 4:06:48 PM By: Alric Quan Entered By: Alric Quan on 04/15/2017 11:29:26 Norma Johns ([id number]) -------------------------------------------------------------------------------- Problem List Details Patient Name: Norma Johns, Norma B. Date of Service: 04/15/2017 10:30 AM Medical Record Number: [id number] Patient Account Number: [id number] Date of Birth/Sex: 08-Oct-1940 (77 y.o. Female) Treating RN: Carolyne Fiscal, Debi Primary Care Provider: Fenton Malling Other Clinician: Referring Provider: Fenton Malling Treating  Provider/Extender: Melburn Hake, HOYT Weeks in Treatment: 0 Active Problems ICD-10 Encounter Code Description Active Date Diagnosis S21.109A Unspecified open wound of unspecified front wall of thorax without 04/15/2017 Yes penetration into thoracic cavity, initial encounter E11.622 Type 2 diabetes mellitus with other skin ulcer 04/15/2017 Yes I10 Essential (primary) hypertension 04/15/2017 Yes A49.02 Methicillin resistant Staphylococcus aureus infection, unspecified 04/15/2017 Yes site Inactive Problems Resolved Problems Electronic Signature(s)  Signed: 04/15/2017 5:42:41 PM By: Worthy Keeler PA-C Entered By: Worthy Keeler on 04/15/2017 13:13:17 Norma Johns ([id number]) -------------------------------------------------------------------------------- Progress Note Details Patient Name: Norma Johns Date of Service: 04/15/2017 10:30 AM Medical Record Number: [id number] Patient Account Number: [id number] Date of Birth/Sex: 1940/12/15 (77 y.o. Female) Treating RN: Carolyne Fiscal, Debi Primary Care Provider: Fenton Malling Other Clinician: Referring Provider: Fenton Malling Treating Provider/Extender: Melburn Hake, HOYT Weeks in Treatment: 0 Subjective Chief Complaint Information obtained from Patient Bilateral mastectomy August 2018 with open ulcer at the JP drain sites History of Present Illness (HPI) The following HPI elements were documented for the patient's wound: Associated Signs and Symptoms: Patient has a history of diabetes mellitus type II which is controlled with diet at this point she also was recently diagnosed with MRSA in regard to the ulcers on her chest for the JP drains were removed. 04/15/17 on evaluation today patient is seen initially concerning issues that she has been having with continuing open ulcerations on the anterior chest wall at the site of the JP drain that replaced following her bilateral mastectomy. She also had lymph nodes removed from the  axillary region bilaterally. With that being said she tells me that she is having fairly significant pain which is rated in the high 7-9/10 range with touch over the chest wall. This is true even which is very light touch. Patient feels like "there has to be something wrong in there". She has not had any CT scans of chest following surgery that she is aware of nor that I could find in the system. With that being said she tells me that it has been offered for her to potentially see a another surgeon for a second opinion but at this point she has not opted nor wanted to really do that she definitely does not want any further surgery she feels like that this one has been much more significant and severe than it should've been. She does see Lillia Abed who is a Librarian, academic at [facility] family practice that is she referred her to our clinic for evaluation. Prior to referral she was diagnosed by culture of having MRSA and was placed on Keflex as well is clindamycin along with a topical antifungal which seems to have improved things for her. She does have a history of diabetes mellitus type II with a hemoglobin A-1 C of 6.3 most recently. Otherwise she has no significant history she does seem to be extremely frustrated with her current situation. Wound History Patient presents with 2 open wounds that have been present for approximately 6 months. Patient has been treating wounds in the following manner: lotrimin. Laboratory tests have not been performed in the last month. Patient reportedly has tested positive for an antibiotic resistant organism. Patient reportedly has not tested positive for osteomyelitis. Patient reportedly has not had testing performed to evaluate circulation in the legs. Patient History Information obtained from Patient. Allergies chocolate, dye (unknown) used at hospital, sulfa, atorvastatin, minocycline HCl, naproxen, niacin, Vioxx, penicillin Family History Cancer  - Father,Siblings, Diabetes - Siblings, Heart Disease - Mother, Hypertension - Mother,Child, No family history of Hereditary Spherocytosis, Kidney Disease, Lung Disease, Seizures, Stroke, Thyroid Problems, Tuberculosis. Social History Never smoker, Marital Status - Married, Alcohol Use - Never, Drug Use - No History, Caffeine Use - Never. Medical History Norma Johns, Norma Johns ([id number]) Hematologic/Lymphatic Patient has history of Anemia Respiratory Patient has history of Asthma, Sleep Apnea Endocrine Patient has history of Type II Diabetes Musculoskeletal Patient has history  of Osteoarthritis Patient is treated with Controlled Diet. Review of Systems (ROS) Constitutional Symptoms (General Health) Complains or has symptoms of Fever, Chills. Eyes Complains or has symptoms of Glasses / Contacts. Cardiovascular hyperlipidemia stroke Gastrointestinal gerd Oncologic breast cancer Psychiatric Complains or has symptoms of Anxiety, depression Objective Constitutional patient is hypertensive.. pulse regular and within target range for patient.Marland Kitchen respirations regular, non-labored and within target range for patient.Marland Kitchen temperature within target range for patient.. Obese and well-hydrated in no acute distress. Vitals Time Taken: 10:47 AM, Height: 63 in, Source: Stated, Weight: 166.8 lbs, Source: Measured, BMI: 29.5, Temperature: 97.5 F, Pulse: 60 bpm, Respiratory Rate: 18 breaths/min, Blood Pressure: 148/71 mmHg. Eyes conjunctiva clear no eyelid edema noted. pupils equal round and reactive to light and accommodation. Ears, Nose, Mouth, and Throat no gross abnormality of ear auricles or external auditory canals. normal hearing noted during conversation. mucus membranes moist. Respiratory normal breathing without difficulty. clear to auscultation bilaterally. Cardiovascular regular rate and rhythm with normal S1, S2. no clubbing, cyanosis, significant edema, Gastrointestinal (GI) soft,  non-tender, non-distended, +BS. no ventral hernia noted. Musculoskeletal Norma Johns, Norma B. ([id number]) normal gait and posture. no significant deformity or arthritic changes, no loss or range of motion, no clubbing. Psychiatric this patient is able to make decisions and demonstrates good insight into disease process. Alert and Oriented x 3. pleasant and cooperative. General Notes: On evaluation today patient did not appear to have any open ulcers in regard to the surgical sites on her chest. I did see the two areas for the JP drains were placed but again there is no evidence of an opening at this point she has not experienced any drainage she tells me over the past week. I do believe this has completely closed at this point. There's also no evidence of fungal infection although it does sound like she had both openings as well is an infection noted when she last saw her primary care provider Lillia Abed PA-C. Unfortunately she does have extremely severe pain even to light touch over the chest wall area which is somewhat reminiscent of neuropathic/nerve pain. With that being said she had not really had any CT scan or other evaluation at this point but I'm aware of any way to confirm there is no physical reason for the pain she's experiencing. Assessment Active Problems ICD-10 S21.109A - Unspecified open wound of unspecified front wall of thorax without penetration into thoracic cavity, initial encounter E11.622 - Type 2 diabetes mellitus with other skin ulcer I10 - Essential (primary) hypertension A49.02 - Methicillin resistant Staphylococcus aureus infection, unspecified site Plan [facility]charge From [facility]: Discharge from [place] - We will consult with your primary care physician and they will contact you. Please call our office if you have any questions or concerns. At this point I would recommend that we likely deferred to her primary care regarding treatment going  forward as she may need testing ordered that they would follow up with as well. Potentially even a CT scan or MRI. With that being said I am going to get in touch with Lillia Abed PA-C at [facility] family practice in order to discuss this patient's case. I did attempt to call today but Anderson Malta was not in the office at this point. She is going to give me a call back when she has a chance. Hopefully that will be tomorrow. In the meantime there really was not anything from a wound care perspective that I can offer at this time. I do think  it may be beneficial for her to see a different surgeon just for a second opinion on what she's experiencing but she is somewhat reluctant to do this she does not want any additional surgery which I completely understand considering the poor outcome she seems to be experiencing at this point. Obviously from August to now is a fairly lengthy period of time she has been struggling with this. We will see her just as needed in the future if anything changes but as of right now there are no open wounds for which we can manage her condition. She's in agreement this plan was Norma Johns, Norma Johns ([id number]) appreciative of the evaluation again I did explain to her that her primary care office will likely contact her regarding the next steps. Electronic Signature(s) Signed: 04/15/2017 5:42:41 PM By: Worthy Keeler PA-C Entered By: Worthy Keeler on 04/15/2017 17:29:49 Norma Johns ([id number]) -------------------------------------------------------------------------------- ROS/PFSH Details Patient Name: Norma Johns Date of Service: 04/15/2017 10:30 AM Medical Record Number: [id number] Patient Account Number: [id number] Date of Birth/Sex: Oct 25, 1940 (77 y.o. Female) Treating RN: Carolyne Fiscal, Debi Primary Care Provider: Fenton Malling Other Clinician: Referring Provider: Fenton Malling Treating Provider/Extender: Melburn Hake, HOYT Weeks in Treatment:  0 Information Obtained From Patient Wound History Do you currently have one or more open woundso Yes How many open wounds do you currently haveo 2 Approximately how long have you had your woundso 6 months How have you been treating your wound(s) until nowo lotrimin Has your wound(s) ever healed and then re-openedo No Have you had any lab work done in the past montho No Have you tested positive for an antibiotic resistant organism (MRSA, VRE)o Yes Have you tested positive for osteomyelitis (bone infection)o No Have you had any tests for circulation on your legso No Constitutional Symptoms (General Health) Complaints and Symptoms: Positive for: Fever; Chills Eyes Complaints and Symptoms: Positive for: Glasses / Contacts Psychiatric Complaints and Symptoms: Positive for: Anxiety Review of System Notes: depression Hematologic/Lymphatic Medical History: Positive for: Anemia Respiratory Medical History: Positive for: Asthma; Sleep Apnea Cardiovascular Complaints and Symptoms: Review of System Notes: hyperlipidemia stroke Gastrointestinal Norma Johns, BETTY. ([id number]) Complaints and Symptoms: Review of System Notes: gerd Endocrine Medical History: Positive for: Type II Diabetes Time with diabetes: 15 yrs Treated with: Diet Musculoskeletal Medical History: Positive for: Osteoarthritis Oncologic Complaints and Symptoms: Review of System Notes: breast cancer Immunizations Pneumococcal Vaccine: Received Pneumococcal Vaccination: Yes Implantable Devices Family and Social History Cancer: Yes - Father,Siblings; Diabetes: Yes - Siblings; Heart Disease: Yes - Mother; Hereditary Spherocytosis: No; Hypertension: Yes - Mother,Child; Kidney Disease: No; Lung Disease: No; Seizures: No; Stroke: No; Thyroid Problems: No; Tuberculosis: No; Never smoker; Marital Status - Married; Alcohol Use: Never; Drug Use: No History; Caffeine Use: Never; Financial Concerns: No; Food, Clothing  or Shelter Needs: No; Support System Lacking: No; Transportation Concerns: No; Advanced Directives: No; Patient does not want information on Advanced Directives; Do not resuscitate: No; Living Will: No; Medical Power of Attorney: No Electronic Signature(s) Signed: 04/15/2017 5:42:41 PM By: Worthy Keeler PA-C Signed: 04/16/2017 4:06:48 PM By: Alric Quan Entered By: Alric Quan on 04/15/2017 10:59:04 Norma Johns ([id number]) -------------------------------------------------------------------------------- SuperBill Details Patient Name: Norma Johns Date of Service: 04/15/2017 Medical Record Number: [id number] Patient Account Number: [id number] Date of Birth/Sex: 1940/11/10 (77 y.o. Female) Treating RN: Ahmed Prima Primary Care Provider: Fenton Malling Other Clinician: Referring Provider: Fenton Malling Treating Provider/Extender: Melburn Hake, HOYT Weeks in Treatment: 0 Diagnosis Coding ICD-10 Codes Code Description Unspecified open  wound of unspecified front wall of thorax without penetration into thoracic cavity, initial S21.109A encounter E11.622 Type 2 diabetes mellitus with other skin ulcer I10 Essential (primary) hypertension A49.02 Methicillin resistant Staphylococcus aureus infection, unspecified site Facility Procedures CPT4 Code: [id number] Description: 99213 - WOUND CARE VISIT-LEV 3 EST PT Modifier: Quantity: 1 Physician Procedures CPT4: Description Modifier Quantity Code [id number] WC PHYS LEVEL 3 o NEW PT 1 ICD-10 Diagnosis Description O48.592N Unspecified open wound of unspecified front wall of thorax without penetration into thoracic cavity, initial encounter E11.622 Type 2  diabetes mellitus with other skin ulcer I10 Essential (primary) hypertension A49.02 Methicillin resistant Staphylococcus aureus infection, unspecified site Electronic Signature(s) Signed: 04/15/2017 5:42:41 PM By: Worthy Keeler PA-C Previous Signature: 04/15/2017 4:14:01  PM Version By: Alric Quan Entered By: Worthy Keeler on 04/15/2017 17:30:10

## 2017-04-17 NOTE — Telephone Encounter (Signed)
Can we call Norma Johns and see if she would be willing to get an US of the chest since it is superficial area to see if it is a pocket of infection or not. It is possible that dude to the tenderness of that area she may not be able to tolerate them pressing on the area. If she can't we can get a chest CT, I just did not want to add more radiation.

## 2017-04-17 NOTE — Progress Notes (Signed)
DAISA, STENNIS ([id number]) Visit Report for 04/15/2017 Abuse/Suicide Risk Screen Details Patient Name: Norma Johns, Norma Johns. Date of Service: 04/15/2017 10:30 AM Medical Record Number: [id number] Patient Account Number: [id number] Date of Birth/Sex: 1940/10/03 (77 y.o. Female) Treating RN: Ahmed Prima Primary Care Samarion Ehle: Fenton Malling Other Clinician: Referring Damian Hofstra: Fenton Malling Treating Jaimere Feutz/Extender: Melburn Hake, HOYT Weeks in Treatment: 0 Abuse/Suicide Risk Screen Items Answer ABUSE/SUICIDE RISK SCREEN: Has anyone close to you tried to hurt or harm you recentlyo No Do you feel uncomfortable with anyone in your familyo No Has anyone forced you do things that you didnot want to doo No Do you have any thoughts of harming yourselfo No Patient displays signs or symptoms of abuse and/or neglect. No Electronic Signature(s) Signed: 04/16/2017 4:06:48 PM By: Alric Quan Entered By: Alric Quan on 04/15/2017 10:59:13 Norma Johns ([id number]) -------------------------------------------------------------------------------- Activities of Daily Living Details Patient Name: [name], [name] Date of Service: 04/15/2017 10:30 AM Medical Record Number: [id number] Patient Account Number: [id number] Date of Birth/Sex: 1940/03/27 (77 y.o. Female) Treating RN: Ahmed Prima Primary Care Terence Googe: Fenton Malling Other Clinician: Referring Aerabella Galasso: Fenton Malling Treating Nobuo Nunziata/Extender: Melburn Hake, HOYT Weeks in Treatment: 0 Activities of Daily Living Items Answer Activities of Daily Living (Please select one for each item) Drive Automobile Completely Able Take Medications Completely Able Use Telephone Completely Able Care for Appearance Completely Able Use Toilet Completely Able Bath / Shower Completely Able Dress Self Completely Able Feed Self Completely Able Walk Completely Able Get In / Out Bed Completely Able Housework Completely  Able Prepare Meals Completely Marine Completely Able Shop for Self Completely Able Electronic Signature(s) Signed: 04/16/2017 4:06:48 PM By: Alric Quan Entered By: Alric Quan on 04/15/2017 10:59:30 Norma Johns ([id number]) -------------------------------------------------------------------------------- Education Assessment Details Patient Name: Norma Johns Date of Service: 04/15/2017 10:30 AM Medical Record Number: [id number] Patient Account Number: [id number] Date of Birth/Sex: 22-Jul-1940 (77 y.o. Female) Treating RN: Carolyne Fiscal, Debi Primary Care Itzayana Pardy: Fenton Malling Other Clinician: Referring Taetum Flewellen: Fenton Malling Treating Liesa Tsan/Extender: Melburn Hake, HOYT Weeks in Treatment: 0 Primary Learner Assessed: Patient Learning Preferences/Education Level/Primary Language Learning Preference: Explanation, Printed Material Highest Education Level: High School Preferred Language: English Cognitive Barrier Assessment/Beliefs Language Barrier: No Translator Needed: No Memory Deficit: No Emotional Barrier: No Cultural/Religious Beliefs Affecting Medical Care: No Physical Barrier Assessment Impaired Vision: Yes Glasses Impaired Hearing: No Decreased Hand dexterity: No Knowledge/Comprehension Assessment Knowledge Level: Medium Comprehension Level: Medium Ability to understand written Medium instructions: Ability to understand verbal Medium instructions: Motivation Assessment Anxiety Level: Calm Cooperation: Cooperative Education Importance: Acknowledges Need Interest in Health Problems: Asks Questions Perception: Coherent Willingness to Engage in Self- Medium Management Activities: Readiness to Engage in Self- Medium Management Activities: Electronic Signature(s) Signed: 04/16/2017 4:06:48 PM By: Alric Quan Entered By: Alric Quan on 04/15/2017 10:59:52 Norma Johns  ([id number]) -------------------------------------------------------------------------------- Fall Risk Assessment Details Patient Name: Norma Johns Date of Service: 04/15/2017 10:30 AM Medical Record Number: [id number] Patient Account Number: [id number] Date of Birth/Sex: [birth year] (77 y.o. Female) Treating RN: Carolyne Fiscal, Debi Primary Care Oddis Westling: Fenton Malling Other Clinician: Referring Deandrea Rion: Fenton Malling Treating Delma Villalva/Extender: Melburn Hake, HOYT Weeks in Treatment: 0 Fall Risk Assessment Items Have you had 2 or more falls in the last 12 monthso 0 Yes Have you had any fall that resulted in injury in the last 12 monthso 0 Yes FALL RISK ASSESSMENT: History of falling - immediate or within 3 months 0 No Secondary diagnosis 15 Yes Ambulatory aid None/bed rest/wheelchair/nurse 0 No Crutches/cane/walker 15 Yes  Furniture 0 No IV Access/Saline Lock 0 No Gait/Training Normal/bed rest/immobile 0 No Weak 0 No Impaired 0 No Mental Status Oriented to own ability 0 Yes Electronic Signature(s) Signed: 04/16/2017 4:06:48 PM By: Alric Quan Entered By: Alric Quan on 04/15/2017 11:00:28 Norma Johns ([id number]) -------------------------------------------------------------------------------- Foot Assessment Details Patient Name: Norma Silvas B. Date of Service: 04/15/2017 10:30 AM Medical Record Number: [id number] Patient Account Number: [id number] Date of Birth/Sex: October 10, 1940 (77 y.o. Female) Treating RN: Ahmed Prima Primary Care Donnajean Chesnut: Fenton Malling Other Clinician: Referring Yuleimy Kretz: Fenton Malling Treating Lyvonne Cassell/Extender: Melburn Hake, HOYT Weeks in Treatment: 0 Foot Assessment Items Site Locations + = Sensation present, - = Sensation absent, C = Callus, U = Ulcer R = Redness, W = Warmth, M = Maceration, PU = Pre-ulcerative lesion F = Fissure, S = Swelling, D = Dryness Assessment Right: Left: Other Deformity: No No Prior  Foot Ulcer: No No Prior Amputation: No No Charcot Joint: No No Ambulatory Status: Gait: Electronic Signature(s) Signed: 04/16/2017 4:06:48 PM By: Alric Quan Entered By: Alric Quan on 04/15/2017 11:01:27 Norma Johns ([id number]) -------------------------------------------------------------------------------- Nutrition Risk Assessment Details Patient Name: Norma Silvas B. Date of Service: 04/15/2017 10:30 AM Medical Record Number: [id number] Patient Account Number: [id number] Date of Birth/Sex: 1941/01/06 (77 y.o. Female) Treating RN: Carolyne Fiscal, Debi Primary Care Brynden Thune: Fenton Malling Other Clinician: Referring Saiquan Hands: Fenton Malling Treating Daimon Kean/Extender: Melburn Hake, HOYT Weeks in Treatment: 0 Height (in): 63 Weight (lbs): 166.8 Body Mass Index (BMI): 29.5 Nutrition Risk Assessment Items NUTRITION RISK SCREEN: I have an illness or condition that made me change the kind and/or amount of 2 Yes food I eat I eat fewer than two meals per day 3 Yes I eat few fruits and vegetables, or milk products 0 No I have three or more drinks of beer, liquor or wine almost every day 0 No I have tooth or mouth problems that make it hard for me to eat 0 No I don't always have enough money to buy the food I need 0 No I eat alone most of the time 0 No I take three or more different prescribed or over-the-counter drugs a day 0 No Without wanting to, I have lost or gained 10 pounds in the last six months 0 No I am not always physically able to shop, cook and/or feed myself 0 No Nutrition Protocols Good Risk Protocol Moderate Risk Protocol Electronic Signature(s) Signed: 04/16/2017 4:06:48 PM By: Alric Quan Entered By: Alric Quan on 04/15/2017 11:00:58

## 2017-04-19 ENCOUNTER — Other Ambulatory Visit: Payer: Self-pay | Admitting: *Deleted

## 2017-04-19 NOTE — Patient Outreach (Signed)
Successful telephone encounter to Norma Johns, 77 year old female, check on clinical status.   Spoke with pt, HIPAA identifiers Verified.  Pt reports not doing good, followed up with PCP 2 weeks ago for chest wounds, culture obtained from wounds, told had MRSA.  Pt reports started on antibiotic, completed, helped plus was referred to [place].  Pt reports MD at [facility] concerned about what is going on on the inside of wounds since outside is healed but she is still having pain to that area.  Pt  Reports Ultrasound (view in EMR chest) is scheduled for 04/22/17.   Pt reports her sugar was up at PCP visit, was told  could be from infection or stress.   RN CM discussed with pt plan to follow up again next week telephonically, check on status, find outcome of Ultrasound.    Plan:  As discussed with pt, plan to follow up again next week telephonically.    [name].   [facility]  [phone]

## 2017-04-22 ENCOUNTER — Ambulatory Visit
Admission: RE | Admit: 2017-04-22 | Discharge: 2017-04-22 | Disposition: A | Discharge status: Home / Self Care | Payer: Medicare Other | Source: Ambulatory Visit | Attending: Physician Assistant | Admitting: Physician Assistant

## 2017-04-22 ENCOUNTER — Telehealth: Payer: Self-pay

## 2017-04-22 DIAGNOSIS — M7989 Other specified soft tissue disorders: Secondary | ICD-10-CM

## 2017-04-22 DIAGNOSIS — M799 Soft tissue disorder, unspecified: Secondary | ICD-10-CM | POA: Insufficient documentation

## 2017-04-22 DIAGNOSIS — R0789 Other chest pain: Secondary | ICD-10-CM | POA: Diagnosis not present

## 2017-04-22 NOTE — Telephone Encounter (Signed)
Not at this time.

## 2017-04-22 NOTE — Telephone Encounter (Signed)
Patient advised as below. Patient wants to know where the MRSA infection is at. Please advise. sd

## 2017-04-22 NOTE — Telephone Encounter (Signed)
Patient advised as below. Patient reports she just wanted to make sure that no more blood work needed to be done to check for MRSA.

## 2017-04-22 NOTE — Telephone Encounter (Signed)
-----   Message from Mar Daring, [place] sent at 04/22/2017 11:18 AM EST ----- The area of palpable pain and tenderness is not an abscess or fluid collection associated with infection. It is a normal appearing subcutaneous fat noted. I will let Mr. Norma Johns with the wound care center know.

## 2017-04-22 NOTE — Telephone Encounter (Signed)
It had been on the skin in the wound incision that I probed that day. If not mistaken it was from the incision on the right. When she saw the wound care clinic it had closed from where it had been open very slightly. It appears the topical antifungal and the oral antibiotics she had previously had allowed it to heal.

## 2017-04-26 ENCOUNTER — Encounter: Payer: Self-pay | Admitting: *Deleted

## 2017-04-26 ENCOUNTER — Other Ambulatory Visit: Payer: Self-pay | Admitting: *Deleted

## 2017-04-26 NOTE — Patient Outreach (Signed)
Successful telephone encounter to Norma Johns, 78 year old female- check on current clinical status, recent Ultrasound.  Spoke with pt, HIPAA identifiers verified.  Pt reports on recent Ultrasound, did not show anything, does not feel good, called PCP office about getting blood work, no longer going to Wound  Care center.    RN CM discussed with pt view in EMR her  phone call to PCP office, response to request for blood work- not at this time.  Pt reports feels  Something still going on, nausea been going on for awhile, weak, can't  Sleep- let PCP's nurse know.   RN CM discussed with pt calling PCP again if needed.  Pt reports she is to see surgeon next week, plans to keep  Appointment.   Discussed with pt plan to discharge from [facility], verified with pt has RN CM's contact number and [facility] main office number to call if needs arise in the future.   Plan:  As discussed with pt, plan [facility]-   no further case management needs.             Plan to inform pt's PCP- Fenton Malling PA of discharge- send  Case closure letter.             Plan to inform [facility] CMA to close case.   [name].   [facility]  [phone]

## 2017-05-02 ENCOUNTER — Encounter: Payer: Self-pay | Admitting: General Surgery

## 2017-05-02 ENCOUNTER — Ambulatory Visit (INDEPENDENT_AMBULATORY_CARE_PROVIDER_SITE_OTHER): Payer: Medicare Other | Admitting: General Surgery

## 2017-05-02 VITALS — BP 136/70 | HR 74 | Resp 14 | Ht 63.0 in | Wt 167.0 lb

## 2017-05-02 DIAGNOSIS — C50512 Malignant neoplasm of lower-outer quadrant of left female breast: Secondary | ICD-10-CM

## 2017-05-02 DIAGNOSIS — Z170 Estrogen receptor positive status [ER+]: Secondary | ICD-10-CM

## 2017-05-02 NOTE — Progress Notes (Signed)
Patient ID: Norma Johns, female   DOB: March 12, 1940, 77 y.o.   MRN: [id number]  Chief Complaint  Patient presents with  . Breast Cancer    HPI Norma Johns is a 77 y.o. female here today for her follow up breast cancer. Patient states she is still having tightness at her surgical area. She is wearing a lymphedema sleeve on left arm. She states the tightness comes and goes. She states the more active she is the tighter she feels. She states the right area started getting sore and draining first part of February when she had a sinus infection.  She states she has been treated for an infection ( 2 rounds of antibiotics) in the right incision and went to the wound clinic as well. She is wearing a lymphedema sleeve on the left arm. She states she has no appetite and she is drinking boost with protein.  HPI  Past Medical History:  Diagnosis Date  . Anemia   . Anxiety   . Arthritis   . Asthma    WELL CONTROLLED  . Breast cancer ([place]) 2018   left breast cnacer  . Cancer ([place])    Bilat Mastectomy  . Depression   . GERD (gastroesophageal reflux disease)   . Headache    H/O  . Hyperlipidemia   . Sleep apnea    DOES NOT USE CPAP-COULD NOT TOLERATE  . Stroke [facility]) 2004    Past Surgical History:  Procedure Laterality Date  . ABDOMINAL HYSTERECTOMY    . BLADDER REPAIR    . BREAST BIOPSY Right 03/23/1991   Fibroadenoma with intramammary lymph node. Right breast, 9:00.  [facility] CHOLECYSTECTOMY    . COLONOSCOPY  2005  . MASTECTOMY MODIFIED RADICAL Left 10/01/2016   Procedure: MASTECTOMY MODIFIED RADICAL;  Surgeon: Robert Bellow, MD;  Location: [facility] ORS;  Service: General;  Laterality: Left;  . MR MRA CAROTID  02/2010   Minimal plaque formation; no significant stenosis. Sx: Syncope  . [place] [facility]  . SENTINEL NODE BIOPSY Right 10/01/2016   Procedure: SENTINEL NODE BIOPSY;  Surgeon: Robert Bellow, MD;  Location: [facility] ORS;  Service: General;  Laterality:  Right;  . SIMPLE MASTECTOMY WITH AXILLARY SENTINEL NODE BIOPSY Right 10/01/2016   Procedure: SIMPLE MASTECTOMY;  Surgeon: Robert Bellow, MD;  Location: [facility] ORS;  Service: General;  Laterality: Right;    Family History  Problem Relation Age of Onset  . Cancer Father   . Prostate cancer Father   . Heart attack Mother   . Hypertension Mother   . CAD Mother   . Hypertension Son   . Diabetes Son   . Lung cancer Brother   . Leukemia Brother   . Lung cancer Brother   . Prostate cancer Brother   . Breast cancer Neg Hx     Social History Social History   Tobacco Use  . Smoking status: Never Smoker  . Smokeless tobacco: Never Used  Substance Use Topics  . Alcohol use: No  . Drug use: No    Allergies  Allergen Reactions  . Chocolate Shortness Of Breath  . Other Shortness Of Breath and Swelling    PT STATES ALLERGY TO "SOME TYPE OF DYE THAT WAS USED AT [facility]."  SWELLING TO ARM AND SOB.  PT CANNOT REMEMBER WHAT THE DYE WAS USED FOR.  . Sulfa Antibiotics Other (See Comments)    unknown  . Atorvastatin Other (See Comments)  Leg pain   . Minocycline Hcl Itching  . Naproxen Other (See Comments)    Indigestion  . Niacin Other (See Comments)    Felt like she was on fire  . Septra [Sulfamethoxazole-Trimethoprim] Other (See Comments)    unknown  . Vioxx [Rofecoxib] Other (See Comments)    unknown  . Penicillins Itching and Rash    Has patient had a PCN reaction causing immediate rash, facial/tongue/throat swelling, SOB or lightheadedness with hypotension: Unknown Has patient had a PCN reaction causing severe rash involving mucus membranes or skin necrosis: Unknown Has patient had a PCN reaction that required hospitalization: No Has patient had a PCN reaction occurring within the last 10 years: No If all of the above answers are "NO", then may proceed with Cephalosporin use.     Current Outpatient Medications  Medication Sig Dispense Refill  . albuterol  (PROVENTIL HFA;VENTOLIN HFA) 108 (90 Base) MCG/ACT inhaler Inhale 2 puffs into the lungs every 4 (four) hours as needed for wheezing or shortness of breath. 1 Inhaler 11  . ALPRAZolam (XANAX) 0.5 MG tablet Take 1 tablet (0.5 mg total) by mouth at bedtime as needed for anxiety. 30 tablet 5  . anastrozole (ARIMIDEX) 1 MG tablet TAKE (1) TABLET BY MOUTH EVERY DAY 30 tablet 3  . clotrimazole (LOTRIMIN) 1 % cream Apply 1 application topically 2 (two) times daily. 30 g 0  . fexofenadine (ALLEGRA) 180 MG tablet Take 180 mg by mouth 3 (three) times a week. PRN    . fluticasone (FLONASE) 50 MCG/ACT nasal spray Place 2 sprays into both nostrils daily. 16 g 5  . Multiple Vitamin (MULTIVITAMIN) capsule Take 1 capsule by mouth daily.    . pantoprazole (PROTONIX) 40 MG tablet Take 1 tablet (40 mg total) 2 (two) times daily before a meal by mouth. 180 tablet 1  . vitamin B-12 (CYANOCOBALAMIN) 1000 MCG tablet Take 1,000 mcg by mouth daily.     No current facility-administered medications for this visit.     Review of Systems Review of Systems  Constitutional: Positive for appetite change.  Respiratory: Negative.   Cardiovascular: Negative.     Blood pressure 136/70, pulse 74, resp. rate 14, height 5\' 3"  (1.6 m), weight 167 lb (75.8 kg), SpO2 98 %. Weight at the time of the February 27, 2017 exam: 165 pounds.  Physical Exam Physical Exam  Pulmonary/Chest:    6 mm area of irrigated at medial end of mastectomy site. Bilateral mastectomy sites   There is no evidence of loculated fluid induration or thickening throughout either mastectomy site.  Data Reviewed Wound clinic assessment by Jeri Cos, PA-C of April 15, 2017 reviewed. Discussion of the advisability of second surgical opinion included in the note. PCP notes with Fenton Malling, PA-C of April 02, 2017 reviewed.  Recommendation to refer to wound clinic rather than to surgery included.  Assessment    No evidence of weight loss.  No  evidence of recurrent tumor.  Focal skin contamination with MRSA without evidence of deep infection.    Plan    The wound clinic notes were reviewed with the patient who denied a request for second surgical opinion.  She reports being surprised at the contents of the note. The patient was surprised that I had not been notified that there was a question of concern about the wound back in February, I assured her I received no copy of her PCP note or of the wound clinic note, and had reviewed them only this morning.  The patient is having less chest wall discomfort, and does directly correlated with her level of activity.  This is likely still related to scarring and I would anticipate continued improvement over time.  The small dimple of skin at the medial aspect of the right mastectomy incision can be excised to remove the nidus of infection if she has recurrent inflammation.  The patient is aware to call our office for any questions or new concerns.   The patient is aware to use a heating pad as needed for comfort.       HPI, Physical Exam, Assessment and Plan have been scribed under the direction and in the presence of Robert Bellow, MD. Karie Fetch, RN  I have completed the exam and reviewed the above documentation for accuracy and completeness.  I agree with the above.  Haematologist has been used and any errors in dictation or transcription are unintentional.  Hervey Ard, M.D., F.A.C.S.  Forest Gleason Byrnett 05/04/2017, 10:06 AM

## 2017-05-02 NOTE — Patient Instructions (Addendum)
The patient is aware to call our office for any questions or new concerns. The patient is aware to use a heating pad as needed for comfort.

## 2017-05-10 ENCOUNTER — Ambulatory Visit: Admission: RE | Admit: 2017-05-10 | Payer: Medicare Other | Source: Ambulatory Visit

## 2017-05-12 NOTE — Progress Notes (Addendum)
Hematology/Oncology Follow Up Note [facility] Telephone:([phone] [phone]   Patient Care Team: Rubye Beach as PCP - General (Family Medicine) Bary Castilla, Forest Gleason, MD (General Surgery) Earlie Server, MD as Consulting Physician (Oncology)  REASON FOR VISIT Follow up for treatment of breast cancer.  HISTORY OF PRESENTING ILLNESS/PERTINENT ONCOLOGY HISTORY Norma Johns is a 77 y.o.afemale who has above oncology history reviewed by me today presented for follow up visit for management of   evaluation and management of breast cancer.   Patient went for routine mammogram screening in May. Abnormalities was found on the left breast. Patient was called back to have diagnostic mammogram done on 08/08/2016 which showed left breast mass at 4:00 3 cm from nipple and a 4:00 6 cm of the nipple. Indeterminate left axillary lymph nodes.  Patient underwent biopsy on 08/15/2016. Pathology showed invasive lobular carcinoma for both breast lesion, . DCIS present, lymph node biopsy is positive for metastatic lobular carcinoma  MRI of the breast on 09/10/2016 showed the having small no mass area of progressive enhancement, left breast 2 biopsy proven malignancy seen in the left breast were separated by 3.4 cm band of weakly enhancing tissue. An area of stippled discontinuous no masses enhancement extends superiorly in the left breast in 2 the upper outer quadrant spanning approximately 3.2 cm anterior/superior to the more anterior of the 2 biopsy proven malignancy. Recommend MRI guided core biopsy of the treated area of non-mass enhancement. Patient was seen and evaluated by Dr. Tollie Pizza and position was left modified radical mastectomy, right simple mastectomy with sentinel node biopsy. Patient underwent surgery on 10/01/2016. SHe tolerated the procedure well.  She declined systemic chemotherapy or radiation therapy. She was started on Arimidex '1mg'$  daily since  September. Denies any hotflush or joint pain.  Her wound has healed well and she follows up with surgeon for seroma. She does not complaints about pain. She noticed a small area of fluid collection.   INTERVAL HISTORY Patient reports feeling well. She reports that her appetite has been low however her weight has been stable.  She eats vegetables and protein shakes.  She tells me that she is not happy that she now has accumulated so much of abdominal fat.  She feels that the breast surgery has pushed the fat into her abdomen.  She says "have never been like this.  I am not satisfied".  She had discussed with Dr. Tollie Pizza about her concerns.  She has CT chest abdomen pelvis scheduled prior to the current visit.  She declined. She takes Arimidex as instructed.  Denies any new complaints.  She has a history of depression/anxiety.  And takes Xanax as needed.  ROS:  Review of Systems  Constitutional: Negative for chills and fatigue.  HENT:   Negative for hearing loss, lump/mass and nosebleeds.   Eyes: Negative for eye problems and icterus.  Respiratory: Negative for chest tightness, cough and hemoptysis.   Cardiovascular: Negative for chest pain and leg swelling.  Gastrointestinal: Negative for abdominal distention, abdominal pain, constipation and diarrhea.  Endocrine: Negative for hot flashes.  Genitourinary: Negative for difficulty urinating, dyspareunia, frequency and hematuria.   Musculoskeletal: Negative for arthralgias, back pain and myalgias.       Chronic back pain  Skin: Negative for itching and rash.  Neurological: Negative for dizziness, headaches and light-headedness.  Hematological: Negative.  Negative for adenopathy. Does not bruise/bleed easily.  Psychiatric/Behavioral: The patient is not nervous/anxious.     MEDICAL HISTORY:  Past Medical History:  Diagnosis Date  . Anemia   . Anxiety   . Arthritis   . Asthma    WELL CONTROLLED  . Breast cancer ([place]) 2018   left breast  cnacer  . Cancer ([place])    Bilat Mastectomy  . Depression   . GERD (gastroesophageal reflux disease)   . Headache    H/O  . Hyperlipidemia   . Sleep apnea    DOES NOT USE CPAP-COULD NOT TOLERATE  . Stroke [facility]) 2004    SURGICAL HISTORY: Past Surgical History:  Procedure Laterality Date  . ABDOMINAL HYSTERECTOMY    . BLADDER REPAIR    . BREAST BIOPSY Right 03/23/1991   Fibroadenoma with intramammary lymph node. Right breast, 9:00.  Marland Kitchen CHOLECYSTECTOMY    . COLONOSCOPY  2005  . MASTECTOMY MODIFIED RADICAL Left 10/01/2016   Procedure: MASTECTOMY MODIFIED RADICAL;  Surgeon: Robert Bellow, MD;  Location: [facility] ORS;  Service: General;  Laterality: Left;  . MR MRA CAROTID  02/2010   Minimal plaque formation; no significant stenosis. Sx: Syncope  . [facility]  . SENTINEL NODE BIOPSY Right 10/01/2016   Procedure: SENTINEL NODE BIOPSY;  Surgeon: Robert Bellow, MD;  Location: [facility] ORS;  Service: General;  Laterality: Right;  . SIMPLE MASTECTOMY WITH AXILLARY SENTINEL NODE BIOPSY Right 10/01/2016   Procedure: SIMPLE MASTECTOMY;  Surgeon: Robert Bellow, MD;  Location: [facility] ORS;  Service: General;  Laterality: Right;    SOCIAL HISTORY: Social History   Socioeconomic History  . Marital status: Married    Spouse name: Not on file  . Number of children: 2  . Years of education: Not on file  . Highest education level: 12th grade  Social Needs  . Financial resource strain: Very hard  . Food insecurity - worry: Never true  . Food insecurity - inability: Never true  . Transportation needs - medical: No  . Transportation needs - non-medical: No  Occupational History  . Occupation: retired  Tobacco Use  . Smoking status: Never Smoker  . Smokeless tobacco: Never Used  Substance and Sexual Activity  . Alcohol use: No  . Drug use: No  . Sexual activity: No  Other Topics Concern  . Not on file  Social History Narrative  . Not on file    FAMILY  HISTORY: Family History  Problem Relation Age of Onset  . Cancer Father   . Prostate cancer Father   . Heart attack Mother   . Hypertension Mother   . CAD Mother   . Hypertension Son   . Diabetes Son   . Lung cancer Brother   . Leukemia Brother   . Lung cancer Brother   . Prostate cancer Brother   . Breast cancer Neg Hx     ALLERGIES:  is allergic to chocolate; other; sulfa antibiotics; atorvastatin; minocycline hcl; naproxen; niacin; septra [sulfamethoxazole-trimethoprim]; vioxx [rofecoxib]; and penicillins.  MEDICATIONS:  Current Outpatient Medications  Medication Sig Dispense Refill  . albuterol (PROVENTIL HFA;VENTOLIN HFA) 108 (90 Base) MCG/ACT inhaler Inhale 2 puffs into the lungs every 4 (four) hours as needed for wheezing or shortness of breath. 1 Inhaler 11  . ALPRAZolam (XANAX) 0.5 MG tablet Take 1 tablet (0.5 mg total) by mouth at bedtime as needed for anxiety. 30 tablet 5  . anastrozole (ARIMIDEX) 1 MG tablet TAKE (1) TABLET BY MOUTH EVERY DAY 30 tablet 3  . clotrimazole (LOTRIMIN) 1 % cream Apply 1 application topically 2 (  two) times daily. 30 g 0  . fexofenadine (ALLEGRA) 180 MG tablet Take 180 mg by mouth 3 (three) times a week. PRN    . fluticasone (FLONASE) 50 MCG/ACT nasal spray Place 2 sprays into both nostrils daily. 16 g 5  . Multiple Vitamin (MULTIVITAMIN) capsule Take 1 capsule by mouth daily.    . pantoprazole (PROTONIX) 40 MG tablet Take 1 tablet (40 mg total) 2 (two) times daily before a meal by mouth. 180 tablet 1  . vitamin B-12 (CYANOCOBALAMIN) 1000 MCG tablet Take 1,000 mcg by mouth daily.     No current facility-administered medications for this visit.       Marland Kitchen  PHYSICAL EXAMINATION: ECOG PERFORMANCE STATUS: 0 - Asymptomatic Vitals:   05/13/17 1332 05/13/17 1336  BP:  122/78  Pulse:  67  Resp: 12   Temp:  98.1 F (36.7 C)   Filed Weights   05/13/17 1332  Weight: 171 lb 4.8 oz (77.7 kg)   Physical Exam  Constitutional: She is oriented  to person, place, and time and well-developed, well-nourished, and in no distress. No distress.  HENT:  Head: Normocephalic and atraumatic.  Mouth/Throat: No oropharyngeal exudate.  Eyes: Pupils are equal, round, and reactive to light. Left eye exhibits no discharge. No scleral icterus.  Neck: Normal range of motion. Neck supple. No JVD present.  Cardiovascular: Normal rate and normal heart sounds. Exam reveals no friction rub.  No murmur heard. Pulmonary/Chest: Effort normal and breath sounds normal. No respiratory distress. She has no wheezes.  Abdominal: Soft. Bowel sounds are normal. She exhibits no distension. There is no tenderness. There is no rebound.  Musculoskeletal: Normal range of motion. She exhibits no edema.  Lymphadenopathy:    She has no cervical adenopathy.  Neurological: She is alert and oriented to person, place, and time. No cranial nerve deficit.  Skin: Skin is warm and dry. No erythema.  Psychiatric: Memory, affect and judgment normal.  Breast exam is performed in seated and lying down position. Patient is status post bilateral mastectomy.  Scars are well healed,  focal area over the sternum questionable seroma. LABORATORY DATA:  I have reviewed the data as listed Lab Results  Component Value Date   WBC 7.2 03/11/2017   HGB 12.7 03/11/2017   HCT 39.1 03/11/2017   MCV 89.7 03/11/2017   PLT 312 03/11/2017   Recent Labs    10/22/16 1018  01/08/17 1033 02/11/17 1155 03/11/17 1148  NA 136   < > 137 139 139  K 4.5   < > 3.8 3.7 4.2  CL 103   < > 106 108 108  CO2 28   < > '25 24 27  '$ GLUCOSE 127*   < > 134* 156* 127*  BUN 11   < > '10 12 11  '$ CREATININE 0.67   < > 0.58 0.66 0.59  CALCIUM 9.7   < > 9.0 9.3 9.2  GFRNONAA >60   < > >60 >60 >60  GFRAA >60   < > >60 >60 >60  PROT 6.6  --   --  6.6 6.9  ALBUMIN 4.0  --   --  3.7 3.8  AST 15  --   --  20 16  ALT 11*  --   --  13* 11*  ALKPHOS 76  --   --  45 40  BILITOT 0.8  --   --  0.6 0.8   < > = values in  this interval not displayed.  Pathology 10/01/2016 SPECIMEN SUBMITTED:  A. Breast, left, and axillary contents  B. Breast, right; simple mastectomy  C. Sentinel node 1, right  DIAGNOSIS:  A. LEFT BREAST AND AXILLARY CONTENTS; MODIFIED RADICAL MASTECTOMY:  - INVASIVE CARCINOMA WITH DUCTAL AND LOBULAR FEATURES.  - LARGEST FOCUS OF INVASIVE CARCINOMA MEASURES 10 MM.  - NINETEEN OF NINETEEN LYMPH NODES POSITIVE FOR METASTASIS (19/19).  - BIOPSY SITE CHANGES, MARKER CLIP PRESENT.  - SURGICAL MARGINS ARE NEGATIVE.  B. RIGHT BREAST; SIMPLE MASTECTOMY:  - FIBROCYSTIC CHANGE.  - NEGATIVE FOR ATYPIA AND MALIGNANCY.  C. SENTINEL LYMPH NODE 1, RIGHT; EXCISION:  - NO TUMOR SEEN IN ONE LYMPH NODE (0/1).  Surgical Pathology Cancer Case Summary   INVASIVE CARCINOMA OF THEBREAST  Procedure: modified radical mastectomy  Specimen Laterality: left  Histologic Type: invasive carcinoma with ductal and lobular features  Histologic Grade (Nottingham Histologic Score)       Glandular (Acinar)/Tubular Differentiation: 3       Nuclear Pleomorphism: 1       Mitotic Rate: 1       Overall Grade: 1  Tumor Size: 10 mm  Ductal Carcinoma in Situ (DCIS): present       Nuclear Grade: 1       Extensive intraductal component: negative  Lymphovascular Invasion: present  Treatment Effect: not applicable  Margins:       Invasive carcinoma: margins negative       Distance from closest margin: 20 mm from deep margin       DCIS: margins negative         Distance from closest margin: 20 mm from deep margin  Regional Lymph nodes:    Total # lymph nodes examined: 19    # Sentinel lymph nodes examined: 0    # Lymph nodes with macrometastasis (>2.0 mm): 18    # Lymph nodes with isolated tumor cells (<0.14m): 0    # Lymph nodes with micrometastasis (> 0.2 mm and < 2.0 mm): 1    Extranodal extension: focally present  Pathologic Stage  Classification (pTNM, AJCC 7th Edition)    TNM Descriptors: m (multiple foci of invasive carcinoma)    pTNM: mpT1b pN3a   Note: Two masses are identified. The 3rd grossly described nodule in the  upper outer quadrant is an intramammary node nearly replaced by tumor.  Biomarker testing was performed on a separate specimen and in summary:  Estrogen Receptor (ER) Status: POSITIVE, >90%    Average intensity of staining: Strong  Progesterone Receptor (PgR) Status: POSITIVE, >90%    Average intensity of staining: Moderate  HER2 (by immunohistochemistry): NEGATIVE (Score 1+)    Pathology 10/01/2016 SPECIMEN SUBMITTED:  A. Breast, left, 4:00, 3 CMFN, biopsy  B. Breast, left, 4:00, 6 CMFN, biopsy  C. Lymph node, left axilla, biopsy  DIAGNOSIS:  A. BREAST, LEFT, 4 O'CLOCK 3 CM FROM NIPPLE; ULTRASOUND-GUIDED CORE  BIOPSY:  - INVASIVE LOBULAR CARCINOMA, CLASSIC TYPE.  Size of invasive carcinoma: 6 mm in this sample  Histologic grade of invasive carcinoma: Grade 1    Glandular/tubular differentiation score: 3    Nuclear pleomorphism score: 1    Mitotic rate score: 1    Total score: 5  Ductal carcinomain situ: Present, low grade  Lymphovascular invasion: Not identified  B. BREAST, LEFT, 4 O'CLOCK 6 CM FROM NIPPLE; ULTRASOUND-GUIDED CORE  BIOPSY:  - INVASIVE LOBULAR CARCINOMA, CLASSIC TYPE.  Size of invasive carcinoma: 6 mm in this sample  Histologic grade of invasive carcinoma: Grade 1  Glandular/tubular differentiation score: 3    Nuclear pleomorphism score: 1    Mitotic rate score: 1    Total score: 5  Ductal carcinoma in situ: Not identified  Lymphovascular invasion: Not identified  C. LYMPH NODE, LEFT AXILLA; ULTRASOUND-GUIDED CORE BIOPSY:  - METASTATIC LOBULAR CARCINOMA; TUMOR SPANS 7 MM.  - NO EXTRANODAL EXTENSION IN THIS SAMPLE.   ASSESSMENT & PLAN:  Cancer Staging Malignant neoplasm of lower-outer quadrant of left breast of female,  estrogen receptor positive ([place]) Staging form: Breast, AJCC 8th Edition - Clinical stage from 10/22/2016: Stage IIIB (cT1b(m), cN3a, cM0, G1, ER: Positive, PR: Positive, HER2: Negative) - Signed by Earlie Server, MD on 10/22/2016 - Pathologic stage from 10/22/2016: pT1b(m), pN3a, cM0, ER: Positive, PR: Positive, HER2: Negative - Signed by Earlie Server, MD on 10/22/2016  1. Lobular carcinoma in situ (LCIS) of left breast   2. Malignant neoplasm of lower-outer quadrant of left breast of female, estrogen receptor positive ([place])   3. Osteopenia of neck of left femur     77 yo female with stage III B, ER/PR positive HER-2 negative invasive carcinoma with both ductal and lobular features. She has 19 out of 19 axillary lymph nodes positive on the left. S/p lumpectomy and declined both chemotherapy and radiation, persistent elevated CA 27.29, CT and bone scan negative for metastasis  Discussed with patient that usually CAT scan is not indicated in breast cancer patient who has completed recommended adjuvant treatments.  However patient chose not to have any adjuvant chemotherapy or radiation.  Recurrence risk is high that is why ordered a CT scan.  Patient is willing to have CT scan done in 3 months prior to her next visit.  We will schedule.  Tolerating Arimidex 1 mg by mouth daily. She adjuvant Zometa due in June 2019..  All questions were answered. The patient knows to call the clinic with any problems questions or concerns.  Return of visit: 3 months    Earlie Server, MD, PhD Hematology Oncology [facility] at [facility] Pager- [phone] 05/13/2017

## 2017-05-13 ENCOUNTER — Inpatient Hospital Stay: Payer: Medicare Other | Attending: Oncology

## 2017-05-13 ENCOUNTER — Other Ambulatory Visit: Payer: Self-pay

## 2017-05-13 ENCOUNTER — Encounter: Payer: Self-pay | Admitting: Oncology

## 2017-05-13 ENCOUNTER — Inpatient Hospital Stay (HOSPITAL_BASED_OUTPATIENT_CLINIC_OR_DEPARTMENT_OTHER): Payer: Medicare Other | Admitting: Oncology

## 2017-05-13 VITALS — BP 122/78 | HR 67 | Temp 98.1°F | Resp 12 | Ht 63.0 in | Wt 171.3 lb

## 2017-05-13 DIAGNOSIS — Z9012 Acquired absence of left breast and nipple: Secondary | ICD-10-CM | POA: Diagnosis not present

## 2017-05-13 DIAGNOSIS — Z9889 Other specified postprocedural states: Secondary | ICD-10-CM | POA: Insufficient documentation

## 2017-05-13 DIAGNOSIS — Z8249 Family history of ischemic heart disease and other diseases of the circulatory system: Secondary | ICD-10-CM | POA: Insufficient documentation

## 2017-05-13 DIAGNOSIS — Z8673 Personal history of transient ischemic attack (TIA), and cerebral infarction without residual deficits: Secondary | ICD-10-CM

## 2017-05-13 DIAGNOSIS — M85852 Other specified disorders of bone density and structure, left thigh: Secondary | ICD-10-CM

## 2017-05-13 DIAGNOSIS — Z880 Allergy status to penicillin: Secondary | ICD-10-CM | POA: Insufficient documentation

## 2017-05-13 DIAGNOSIS — Z90710 Acquired absence of both cervix and uterus: Secondary | ICD-10-CM

## 2017-05-13 DIAGNOSIS — Z170 Estrogen receptor positive status [ER+]: Secondary | ICD-10-CM | POA: Diagnosis not present

## 2017-05-13 DIAGNOSIS — Z882 Allergy status to sulfonamides status: Secondary | ICD-10-CM | POA: Insufficient documentation

## 2017-05-13 DIAGNOSIS — Z886 Allergy status to analgesic agent status: Secondary | ICD-10-CM

## 2017-05-13 DIAGNOSIS — Z801 Family history of malignant neoplasm of trachea, bronchus and lung: Secondary | ICD-10-CM

## 2017-05-13 DIAGNOSIS — Z79899 Other long term (current) drug therapy: Secondary | ICD-10-CM | POA: Diagnosis not present

## 2017-05-13 DIAGNOSIS — C50512 Malignant neoplasm of lower-outer quadrant of left female breast: Secondary | ICD-10-CM | POA: Insufficient documentation

## 2017-05-13 DIAGNOSIS — Z806 Family history of leukemia: Secondary | ICD-10-CM | POA: Insufficient documentation

## 2017-05-13 DIAGNOSIS — M8580 Other specified disorders of bone density and structure, unspecified site: Secondary | ICD-10-CM | POA: Diagnosis not present

## 2017-05-13 DIAGNOSIS — K219 Gastro-esophageal reflux disease without esophagitis: Secondary | ICD-10-CM

## 2017-05-13 DIAGNOSIS — Z9049 Acquired absence of other specified parts of digestive tract: Secondary | ICD-10-CM | POA: Diagnosis not present

## 2017-05-13 DIAGNOSIS — D0502 Lobular carcinoma in situ of left breast: Secondary | ICD-10-CM

## 2017-05-13 DIAGNOSIS — C50919 Malignant neoplasm of unspecified site of unspecified female breast: Secondary | ICD-10-CM

## 2017-05-13 DIAGNOSIS — F329 Major depressive disorder, single episode, unspecified: Secondary | ICD-10-CM | POA: Diagnosis not present

## 2017-05-13 DIAGNOSIS — Z8639 Personal history of other endocrine, nutritional and metabolic disease: Secondary | ICD-10-CM

## 2017-05-13 LAB — CBC WITH DIFFERENTIAL/PLATELET
BASOS PCT: 1 %
Basophils Absolute: 0.1 10*3/uL (ref 0–0.1)
Eosinophils Absolute: 0.1 10*3/uL (ref 0–0.7)
Eosinophils Relative: 1 %
HEMATOCRIT: 36.4 % (ref 35.0–47.0)
Hemoglobin: 12.3 g/dL (ref 12.0–16.0)
LYMPHS PCT: 30 %
Lymphs Abs: 2.3 10*3/uL (ref 1.0–3.6)
MCH: 30.3 pg (ref 26.0–34.0)
MCHC: 33.8 g/dL (ref 32.0–36.0)
MCV: 89.7 fL (ref 80.0–100.0)
Monocytes Absolute: 0.5 10*3/uL (ref 0.2–0.9)
Monocytes Relative: 7 %
Neutro Abs: 4.6 10*3/uL (ref 1.4–6.5)
Neutrophils Relative %: 61 %
Platelets: 272 10*3/uL (ref 150–440)
RBC: 4.06 MIL/uL (ref 3.80–5.20)
RDW: 14.3 % (ref 11.5–14.5)
WBC: 7.6 10*3/uL (ref 3.6–11.0)

## 2017-05-13 LAB — COMPREHENSIVE METABOLIC PANEL
ALBUMIN: 3.7 g/dL (ref 3.5–5.0)
ALK PHOS: 44 U/L (ref 38–126)
ALT: 11 U/L — AB (ref 14–54)
ANION GAP: 6 (ref 5–15)
AST: 16 U/L (ref 15–41)
BILIRUBIN TOTAL: 0.5 mg/dL (ref 0.3–1.2)
BUN: 12 mg/dL (ref 6–20)
CO2: 25 mmol/L (ref 22–32)
CREATININE: 0.8 mg/dL (ref 0.44–1.00)
Calcium: 9.8 mg/dL (ref 8.9–10.3)
Chloride: 107 mmol/L (ref 101–111)
GFR calc Af Amer: >60 mL/min (ref >60)
GFR calc non Af Amer: >60 mL/min (ref >60)
GLUCOSE: 150 mg/dL — AB (ref 65–99)
Potassium: 3.7 mmol/L (ref 3.5–5.1)
SODIUM: 138 mmol/L (ref 135–145)
TOTAL PROTEIN: 6.7 g/dL (ref 6.5–8.1)

## 2017-05-13 NOTE — Progress Notes (Signed)
No changes since last appt. 

## 2017-05-14 LAB — CANCER ANTIGEN 27.29: CA 27.29: 93.5 U/mL — ABNORMAL HIGH (ref 0.0–38.6)

## 2017-05-22 ENCOUNTER — Ambulatory Visit: Payer: Medicare Other | Attending: General Surgery | Admitting: Occupational Therapy

## 2017-05-22 DIAGNOSIS — M6281 Muscle weakness (generalized): Secondary | ICD-10-CM | POA: Diagnosis not present

## 2017-05-22 DIAGNOSIS — M25612 Stiffness of left shoulder, not elsewhere classified: Secondary | ICD-10-CM | POA: Insufficient documentation

## 2017-05-22 DIAGNOSIS — I972 Postmastectomy lymphedema syndrome: Secondary | ICD-10-CM | POA: Insufficient documentation

## 2017-05-22 NOTE — Patient Instructions (Signed)
Kinesiotape for R scar - pulling it open  Ed on signs for allergic  Fitted with new isotoner glove  And Soft tubigrip for hand and forearm  Will contact pink ribbon fund for assistance for compression garments

## 2017-05-22 NOTE — Therapy (Signed)
[facility] [address] [place], [address] Phone: [phone]   Fax:  [phone]  Occupational Therapy Treatment  Patient Details  Name: Norma Johns MRN: [id number] Date of Birth: 1940/03/29 Referring Provider: Fleet Contras   Encounter Date: 05/22/2017  OT End of Session - 05/22/17 1928    Visit Number  4    Number of Visits  6    Date for OT Re-Evaluation  07/03/17    OT Start Time  1425    OT Stop Time  1501    OT Time Calculation (min)  36 min    Activity Tolerance  Patient tolerated treatment well    Behavior During Therapy  [facility] for tasks assessed/performed       Past Medical History:  Diagnosis Date  . Anemia   . Anxiety   . Arthritis   . Asthma    WELL CONTROLLED  . Breast cancer ([place]) 2018   left breast cnacer  . Cancer ([place])    Bilat Mastectomy  . Depression   . GERD (gastroesophageal reflux disease)   . Headache    H/O  . Hyperlipidemia   . Sleep apnea    DOES NOT USE CPAP-COULD NOT TOLERATE  . Stroke [facility]) 2004    Past Surgical History:  Procedure Laterality Date  . ABDOMINAL HYSTERECTOMY    . BLADDER REPAIR    . BREAST BIOPSY Right 03/23/1991   Fibroadenoma with intramammary lymph node. Right breast, 9:00.  Marland Kitchen CHOLECYSTECTOMY    . COLONOSCOPY  2005  . MASTECTOMY MODIFIED RADICAL Left 10/01/2016   Procedure: MASTECTOMY MODIFIED RADICAL;  Surgeon: Robert Bellow, MD;  Location: [facility] ORS;  Service: General;  Laterality: Left;  . MR MRA CAROTID  02/2010   Minimal plaque formation; no significant stenosis. Sx: Syncope  . [facility]  . SENTINEL NODE BIOPSY Right 10/01/2016   Procedure: SENTINEL NODE BIOPSY;  Surgeon: Robert Bellow, MD;  Location: [facility] ORS;  Service: General;  Laterality: Right;  . SIMPLE MASTECTOMY WITH AXILLARY SENTINEL NODE BIOPSY Right 10/01/2016   Procedure: SIMPLE MASTECTOMY;  Surgeon: Robert Bellow, MD;  Location: [facility]  ORS;  Service: General;  Laterality: Right;    There were no vitals filed for this visit.  Subjective Assessment - 05/22/17 1912    Subjective   Pt report that since she seen me month or 2 ago -  She states the right area for mastectomy scar  started getting sore and draining first part of February when she had a sinus infection.  She states she has been treated for an infection ( 2 rounds of antibiotics) in the right incision and went to the wound clinic as well- pt stated the the L side is better but the R still tender and red and want to fold - stay wet- also she wanted to ask about  her compressoin sleeve - because she did not order it because [employer] said her insurance do not cover it     Patient Stated Goals  I want to get this tightness in my chest better to reach over head with less pain , more strength in  L arm to help my husband , and  the swelling better     Currently in Pain?  No/denies          LYMPHEDEMA/ONCOLOGY QUESTIONNAIRE - 05/22/17 1443      Left Upper Extremity Lymphedema   15  cm Proximal to Olecranon Process  35.5 cm    10 cm Proximal to Olecranon Process  35.5 cm    Olecranon Process  28 cm    15 cm Proximal to Ulnar Styloid Process  27 cm    10 cm Proximal to Ulnar Styloid Process  23.5 cm    Just Proximal to Ulnar Styloid Process  20.3 cm    Across Hand at [employer]  21 cm    At [place] of 2nd Digit  7.3 cm    At [facility] of Thumb  7 cm      Measure circumference for L UE and compare to previous   pt report did not use compression the last month or so -because of medical care for infection  Assess scar - L better than R - appear that R one still wants to fold close and stay wet  Pt ed on using and done kinesiotape for R scar - pulling it open  Ed on signs for allergic - provided xtras to use at home   Fitted with new isotoner glove  And Soft tubigrip for hand and forearm  Will contact pink ribbon fund for assistance for compression garments                  OT Education - 05/22/17 1928    Education provided  Yes    Education Details  findings of reassessment and HEP     Person(s) Educated  Patient    Methods  Explanation;Demonstration    Comprehension  Verbalized understanding;Returned demonstration       OT Short Term Goals - 05/22/17 1934      OT SHORT TERM GOAL #1   Title  L UE circumference decrease by 1 cm at L wrist and forearm and 2-3 cm at upperarm to be fitted with compression garments to keep lymphedema under control     Baseline  see circumference measurements  - did increase while she was getting medical care for scar and infection     Time  3    Period  Weeks    Status  On-going    Target Date  06/12/17      OT SHORT TERM GOAL #2   Title  L AROM improve with [facility] with decrease symptoms of tightness , pull less than 3/10     Baseline  in shoulder improve but still over chest     Status  Achieved        OT Long Term Goals - 05/22/17 1935      OT LONG TERM GOAL #1   Title  Strength in L UE at elbow and shoulder increase to 4/5 to be able to perform ADL's and helping her husband with more ease     Status  Achieved      OT LONG TERM GOAL #2   Title  Assess if compression , MLD decrease waist circumference by 5 cm     Baseline  await compression garments     Time  6    Period  Weeks    Status  On-going    Target Date  06/05/17      OT LONG TERM GOAL #3   Title  Pt to be ind in donning, doffing and wearing correct compression to decrease lymphedema     Baseline  did not recieve yet    Time  6    Period  Weeks    Status  On-going  Target Date  06/05/17            Plan - 05/22/17 1930    Clinical Impression Statement  Pt show great AROM in bilateral shoulder and less pain - but tightness over chest and increase with act per pt - as well as fullness or swelling under arm - she was on 2 rounds of antibiotics - ? MRSA - L scar looks better but R still little red and tender - ed  pt on taping it open to stay dry - and  fitted again with temporary soft tubigrip sleeve for hand and forearm until her compression sleeve , glove comes in and [employer]  unilateral breast pad to decrease lymphedema - she did not get it because of insurance will not cover - will contact the pink ribbon fund for assistance and  will let her know     Occupational performance deficits (Please refer to evaluation for details):  ADL's;IADL's;Leisure    Rehab Potential  Good    OT Frequency  1x / week    OT Duration  6 weeks    OT Treatment/Interventions  Self-care/ADL training;Compression bandaging;Manual lymph drainage;Scar mobilization;Therapeutic exercise;Passive range of motion;Manual Therapy    Plan  Check on garments if she receive it and check on taping and scar - lymphedema next week    OT Home Exercise Plan  see pt instruction     Consulted and Agree with Plan of Care  Patient       Patient will benefit from skilled therapeutic intervention in order to improve the following deficits and impairments:  Increased edema, Pain, Decreased scar mobility, Decreased strength, Impaired UE functional use  Visit Diagnosis: Postmastectomy lymphedema syndrome - Plan: Ot plan of care cert/re-cert  Muscle weakness - Plan: Ot plan of care cert/re-cert  Stiffness of left shoulder, not elsewhere classified - Plan: Ot plan of care cert/re-cert    Problem List Patient Active Problem List   Diagnosis Date Noted  . MRSA (methicillin resistant staph aureus) culture positive 04/05/2017  . Lymphedema 02/27/2017  . Shoulder weakness 02/27/2017  . Breast cancer ([place]) 10/01/2016  . Invasive lobular carcinoma of breast, stage 2, left ([place]) 09/18/2016  . Malignant neoplasm of lower-outer quadrant of left breast of female, estrogen receptor positive ([place]) 08/25/2016  . History of CVA with residual deficit 04/17/2016  . Low back pain 06/13/2015  . Anxiety 08/24/2014  . Atrophic vaginitis 08/24/2014  . Body mass  index (BMI) of 29.0-29.9 in adult 08/24/2014  . Cyst of nasal sinus 08/24/2014  . Degenerative joint disease 08/24/2014  . OP (osteoporosis) 08/24/2014  . Bulging eyes 08/24/2014  . GERD (gastroesophageal reflux disease) 08/16/2014  . History of colon polyps 09/21/2008  . Chronic recurrent sinusitis 09/17/2008  . Diabetes mellitus, type 2 ([place]) 08/03/2008  . Hypercholesteremia 06/22/2008  . Cannot sleep 03/23/2008  . Allergic rhinitis 01/08/2008  . Airway hyperreactivity 01/08/2008  . Carotid artery obstruction 01/08/2008  . Clinical depression 01/08/2008  . Benign hypertension 01/08/2008    Rosalyn Gess OTR/L,CLT 05/22/2017, 7:38 PM  [facility] [address], [place], [address] Phone: [phone]   Fax:  [phone]  Name: Norma Johns MRN: [id number] Date of Birth: 24-Dec-1940

## 2017-05-30 ENCOUNTER — Ambulatory Visit: Payer: Medicare Other | Attending: General Surgery | Admitting: Occupational Therapy

## 2017-05-30 DIAGNOSIS — I972 Postmastectomy lymphedema syndrome: Secondary | ICD-10-CM | POA: Insufficient documentation

## 2017-05-30 DIAGNOSIS — M25612 Stiffness of left shoulder, not elsewhere classified: Secondary | ICD-10-CM | POA: Diagnosis not present

## 2017-05-30 DIAGNOSIS — M6281 Muscle weakness (generalized): Secondary | ICD-10-CM | POA: Insufficient documentation

## 2017-05-30 NOTE — Patient Instructions (Signed)
Pt to go to [facility] to get measured - pink ribbon fund will pay Will  contact Matt about pump  Pt to cont with taping scar on R chest - if irritation better   cont with pect stretch daily  And temp sleeve on forearm and hand   Contact [facility] for assistance or resources  And PCP about feeling depressed

## 2017-05-30 NOTE — Therapy (Signed)
[facility] [address] [place], [address] Phone: [phone]   Fax:  [phone]  Occupational Therapy Treatment  Patient Details  Name: Norma Johns MRN: [id number] Date of Birth: 1940-12-29 Referring Provider: Fleet Contras   Encounter Date: 05/30/2017  OT End of Session - 05/30/17 1256    Visit Number  5    Number of Visits  6    Date for OT Re-Evaluation  07/03/17    OT Start Time  1132    OT Stop Time  1215    OT Time Calculation (min)  43 min    Activity Tolerance  Patient tolerated treatment well    Behavior During Therapy  [facility] for tasks assessed/performed       Past Medical History:  Diagnosis Date  . Anemia   . Anxiety   . Arthritis   . Asthma    WELL CONTROLLED  . Breast cancer ([place]) 2018   left breast cnacer  . Cancer ([place])    Bilat Mastectomy  . Depression   . GERD (gastroesophageal reflux disease)   . Headache    H/O  . Hyperlipidemia   . Sleep apnea    DOES NOT USE CPAP-COULD NOT TOLERATE  . Stroke [facility]) 2004    Past Surgical History:  Procedure Laterality Date  . ABDOMINAL HYSTERECTOMY    . BLADDER REPAIR    . BREAST BIOPSY Right 03/23/1991   Fibroadenoma with intramammary lymph node. Right breast, 9:00.  [facility] CHOLECYSTECTOMY    . COLONOSCOPY  2005  . MASTECTOMY MODIFIED RADICAL Left 10/01/2016   Procedure: MASTECTOMY MODIFIED RADICAL;  Surgeon: Robert Bellow, MD;  Location: [facility] ORS;  Service: General;  Laterality: Left;  . MR MRA CAROTID  02/2010   Minimal plaque formation; no significant stenosis. Sx: Syncope  . [facility]  . SENTINEL NODE BIOPSY Right 10/01/2016   Procedure: SENTINEL NODE BIOPSY;  Surgeon: Robert Bellow, MD;  Location: [facility] ORS;  Service: General;  Laterality: Right;  . SIMPLE MASTECTOMY WITH AXILLARY SENTINEL NODE BIOPSY Right 10/01/2016   Procedure: SIMPLE MASTECTOMY;  Surgeon: Robert Bellow, MD;  Location: [facility]  ORS;  Service: General;  Laterality: Right;    There were no vitals filed for this visit.  Subjective Assessment - 05/30/17 1250    Subjective   I am feeling just so tired , and depressed - I am taking care of my husband , and myself - and do not have any help - I am praying and talking to my pastor - but my chest feels tight  when I try and help my husband - and I have swelling in my arm     Patient Stated Goals  I want to get this tightness in my chest better to reach over head with less pain , more strength in  L arm to help my husband , and  the swelling better     Currently in Pain?  No/denies          LYMPHEDEMA/ONCOLOGY QUESTIONNAIRE - 05/30/17 1150      Left Upper Extremity Lymphedema   15 cm Proximal to Olecranon Process  34.5 cm    10 cm Proximal to Olecranon Process  34.8 cm    Olecranon Process  28 cm    15 cm Proximal to Ulnar Styloid Process  26.6 cm    10 cm Proximal to Ulnar Styloid Process  23 cm  Just Proximal to Ulnar Styloid Process  20 cm    Across Hand at [employer]  21 cm    At [place] of 2nd Digit  7.2 cm    At [facility]  7 cm         Kinesiotape for R scar - pulling it open provided again - pt done it at home this past week with good success provided more strips  Ed on signs for allergic  Cont with isotoner glove  And provided Soft tubigrip for hand and forearm   pink ribbon fund for assistance for compression garments approve and pt to get measure after leaving here Pt report tightness in chest - ed pt to cont with pect stretches - she is still doing shoulder flexion and ABD HEP   Pt report increase tightness and swelling under arm on L and chest with increase use - will look into pump to use with chest piece    Provided for pt info on [facility] - report tired and need assistance with taking care of husband   she used [employer] or [employer] before - will contact them                OT Education - 05/30/17 1255     Education provided  Yes    Education Details  plan for lymphedema - pump , compression - and contact [facility] for assistance     Person(s) Educated  Patient    Methods  Explanation;Demonstration;Handout    Comprehension  Verbalized understanding;Returned demonstration       OT Short Term Goals - 05/22/17 1934      OT SHORT TERM GOAL #1   Title  L UE circumference decrease by 1 cm at L wrist and forearm and 2-3 cm at upperarm to be fitted with compression garments to keep lymphedema under control     Baseline  see circumference measurements  - did increase while she was getting medical care for scar and infection     Time  3    Period  Weeks    Status  On-going    Target Date  06/12/17      OT SHORT TERM GOAL #2   Title  L AROM improve with [facility] with decrease symptoms of tightness , pull less than 3/10     Baseline  in shoulder improve but still over chest     Status  Achieved        OT Long Term Goals - 05/22/17 1935      OT LONG TERM GOAL #1   Title  Strength in L UE at elbow and shoulder increase to 4/5 to be able to perform ADL's and helping her husband with more ease     Status  Achieved      OT LONG TERM GOAL #2   Title  Assess if compression , MLD decrease waist circumference by 5 cm     Baseline  await compression garments     Time  6    Period  Weeks    Status  On-going    Target Date  06/05/17      OT LONG TERM GOAL #3   Title  Pt to be ind in donning, doffing and wearing correct compression to decrease lymphedema     Baseline  did not recieve yet    Time  6    Period  Weeks    Status  On-going    Target  Date  06/05/17            Plan - 05/30/17 1256    Clinical Impression Statement  Pt report increase tightness with use in chest L worse than R - scar looked better iwth using tape - request more tape - will get measure for garments - [employer] fund will pay - will recommend at this time pump with chest piece because with increase lymphedema and  tightness under arm and L chest with use - pt feels tired of being primary caretaker for her husband - gave her info on [employer] and she says she use to have assistance from [employer] - will contact them - she denies any thought of hurting herself and smile or laughed few times - and will phone for assistance and her PCP     Occupational performance deficits (Please refer to evaluation for details):  ADL's;IADL's;Leisure    Rehab Potential  Good    OT Frequency  1x / week    OT Duration  4 weeks    OT Treatment/Interventions  Self-care/ADL training;Compression bandaging;Manual lymph drainage;Scar mobilization;Therapeutic exercise;Passive range of motion;Manual Therapy    Plan  check on garments if recieved or pump -scar - and circumference     Clinical Decision Making  Several treatment options, min-mod task modification necessary    OT Home Exercise Plan  see pt instruction     Consulted and Agree with Plan of Care  Patient       Patient will benefit from skilled therapeutic intervention in order to improve the following deficits and impairments:  Increased edema, Pain, Decreased scar mobility, Decreased strength, Impaired UE functional use  Visit Diagnosis: Postmastectomy lymphedema syndrome  Muscle weakness  Stiffness of left shoulder, not elsewhere classified    Problem List Patient Active Problem List   Diagnosis Date Noted  . MRSA (methicillin resistant staph aureus) culture positive 04/05/2017  . Lymphedema 02/27/2017  . Shoulder weakness 02/27/2017  . Breast cancer ([place]) 10/01/2016  . Invasive lobular carcinoma of breast, stage 2, left ([place]) 09/18/2016  . Malignant neoplasm of lower-outer quadrant of left breast of female, estrogen receptor positive ([place]) 08/25/2016  . History of CVA with residual deficit 04/17/2016  . Low back pain 06/13/2015  . Anxiety 08/24/2014  . Atrophic vaginitis 08/24/2014  . Body mass index (BMI) of 29.0-29.9 in adult 08/24/2014  .  Cyst of nasal sinus 08/24/2014  . Degenerative joint disease 08/24/2014  . OP (osteoporosis) 08/24/2014  . Bulging eyes 08/24/2014  . GERD (gastroesophageal reflux disease) 08/16/2014  . History of colon polyps 09/21/2008  . Chronic recurrent sinusitis 09/17/2008  . Diabetes mellitus, type 2 ([place]) 08/03/2008  . Hypercholesteremia 06/22/2008  . Cannot sleep 03/23/2008  . Allergic rhinitis 01/08/2008  . Airway hyperreactivity 01/08/2008  . Carotid artery obstruction 01/08/2008  . Clinical depression 01/08/2008  . Benign hypertension 01/08/2008    Rosalyn Gess OTR/L,CLT  05/30/2017, 1:01 PM  [facility] [address], [place], [address] Phone: [phone]   Fax:  [phone]  Name: TOREE EDLING MRN: [id number] Date of Birth: 28-Feb-1940

## 2017-06-03 ENCOUNTER — Ambulatory Visit: Payer: Self-pay | Admitting: Physician Assistant

## 2017-06-05 ENCOUNTER — Encounter: Payer: Self-pay | Admitting: Physician Assistant

## 2017-06-05 ENCOUNTER — Ambulatory Visit (INDEPENDENT_AMBULATORY_CARE_PROVIDER_SITE_OTHER): Payer: Medicare Other | Admitting: Physician Assistant

## 2017-06-05 VITALS — BP 124/74 | HR 60 | Temp 98.2°F | Resp 16 | Ht 63.0 in | Wt 167.0 lb

## 2017-06-05 DIAGNOSIS — Z6829 Body mass index (BMI) 29.0-29.9, adult: Secondary | ICD-10-CM

## 2017-06-05 DIAGNOSIS — J4521 Mild intermittent asthma with (acute) exacerbation: Secondary | ICD-10-CM | POA: Diagnosis not present

## 2017-06-05 DIAGNOSIS — G479 Sleep disorder, unspecified: Secondary | ICD-10-CM | POA: Diagnosis not present

## 2017-06-05 DIAGNOSIS — J301 Allergic rhinitis due to pollen: Secondary | ICD-10-CM | POA: Diagnosis not present

## 2017-06-05 MED ORDER — TRAZODONE HCL 50 MG PO TABS: 25.0000 mg | ORAL_TABLET | Freq: Every evening | ORAL | 0 refills | Status: DC | PRN

## 2017-06-05 MED ORDER — ALBUTEROL SULFATE HFA 108 (90 BASE) MCG/ACT IN AERS: 2.0000 | INHALATION_SPRAY | RESPIRATORY_TRACT | 11 refills | Status: AC | PRN

## 2017-06-05 MED ORDER — FLUTICASONE PROPIONATE 50 MCG/ACT NA SUSP: 2.0000 | Freq: Every day | NASAL | 5 refills | Status: AC

## 2017-06-05 NOTE — Patient Instructions (Signed)
Insomnia Insomnia is a sleep disorder that makes it difficult to fall asleep or to stay asleep. Insomnia can cause tiredness (fatigue), low energy, difficulty concentrating, mood swings, and poor performance at work or school. There are three different ways to classify insomnia:  Difficulty falling asleep.  Difficulty staying asleep.  Waking up too early in the morning.  Any type of insomnia can be long-term (chronic) or short-term (acute). Both are common. Short-term insomnia usually lasts for three months or less. Chronic insomnia occurs at least three times a week for longer than three months. What are the causes? Insomnia may be caused by another condition, situation, or substance, such as:  Anxiety.  Certain medicines.  Gastroesophageal reflux disease (GERD) or other gastrointestinal conditions.  Asthma or other breathing conditions.  Restless legs syndrome, sleep apnea, or other sleep disorders.  Chronic pain.  Menopause. This may include hot flashes.  Stroke.  Abuse of alcohol, tobacco, or illegal drugs.  Depression.  Caffeine.  Neurological disorders, such as Alzheimer disease.  An overactive thyroid (hyperthyroidism).  The cause of insomnia may not be known. What increases the risk? Risk factors for insomnia include:  Gender. Women are more commonly affected than men.  Age. Insomnia is more common as you get older.  Stress. This may involve your professional or personal life.  Income. Insomnia is more common in people with lower income.  Lack of exercise.  Irregular work schedule or night shifts.  Traveling between different time zones.  What are the signs or symptoms? If you have insomnia, trouble falling asleep or trouble staying asleep is the main symptom. This may lead to other symptoms, such as:  Feeling fatigued.  Feeling nervous about going to sleep.  Not feeling rested in the morning.  Having trouble concentrating.  Feeling  irritable, anxious, or depressed.  How is this treated? Treatment for insomnia depends on the cause. If your insomnia is caused by an underlying condition, treatment will focus on addressing the condition. Treatment may also include:  Medicines to help you sleep.  Counseling or therapy.  Lifestyle adjustments.  Follow these instructions at home:  Take medicines only as directed by your health care provider.  Keep regular sleeping and waking hours. Avoid naps.  Keep a sleep diary to help you and your health care provider figure out what could be causing your insomnia. Include: ? When you sleep. ? When you wake up during the night. ? How well you sleep. ? How rested you feel the next day. ? Any side effects of medicines you are taking. ? What you eat and drink.  Make your bedroom a comfortable place where it is easy to fall asleep: ? Put up shades or special blackout curtains to block light from outside. ? Use a white noise machine to block noise. ? Keep the temperature cool.  Exercise regularly as directed by your health care provider. Avoid exercising right before bedtime.  Use relaxation techniques to manage stress. Ask your health care provider to suggest some techniques that may work well for you. These may include: ? Breathing exercises. ? Routines to release muscle tension. ? Visualizing peaceful scenes.  Cut back on alcohol, caffeinated beverages, and cigarettes, especially close to bedtime. These can disrupt your sleep.  Do not overeat or eat spicy foods right before bedtime. This can lead to digestive discomfort that can make it hard for you to sleep.  Limit screen use before bedtime. This includes: ? Watching TV. ? Using your smartphone, tablet, and   computer.  Stick to a routine. This can help you fall asleep faster. Try to do a quiet activity, brush your teeth, and go to bed at the same time each night.  Get out of bed if you are still awake after 15 minutes  of trying to sleep. Keep the lights down, but try reading or doing a quiet activity. When you feel sleepy, go back to bed.  Make sure that you drive carefully. Avoid driving if you feel very sleepy.  Keep all follow-up appointments as directed by your health care provider. This is important. Contact a health care provider if:  You are tired throughout the day or have trouble in your daily routine due to sleepiness.  You continue to have sleep problems or your sleep problems get worse. Get help right away if:  You have serious thoughts about hurting yourself or someone else. This information is not intended to replace advice given to you by your health care provider. Make sure you discuss any questions you have with your health care provider. Document Released: 02/10/2000 Document Revised: 07/15/2015 Document Reviewed: 11/13/2013 Elsevier Interactive Patient Education  2018 [employer]. Trazodone tablets What is this medicine? TRAZODONE (TRAZ oh done) is used to treat depression. This medicine may be used for other purposes; ask your health care provider or pharmacist if you have questions. COMMON BRAND NAME(S): Desyrel What should I tell my health care provider before I take this medicine? They need to know if you have any of these conditions: -attempted suicide or thinking about it -bipolar disorder -bleeding problems -glaucoma -heart disease, or previous heart attack -irregular heart beat -kidney or liver disease -low levels of sodium in the blood -an unusual or allergic reaction to trazodone, other medicines, foods, dyes or preservatives -pregnant or trying to get pregnant -breast-feeding How should I use this medicine? Take this medicine by mouth with a glass of water. Follow the directions on the prescription label. Take this medicine shortly after a meal or a light snack. Take your medicine at regular intervals. Do not take your medicine more often than directed. Do not  stop taking this medicine suddenly except upon the advice of your doctor. Stopping this medicine too quickly may cause serious side effects or your condition may worsen. A special MedGuide will be given to you by the pharmacist with each prescription and refill. Be sure to read this information carefully each time. Talk to your pediatrician regarding the use of this medicine in children. Special care may be needed. Overdosage: If you think you have taken too much of this medicine contact a poison control center or emergency room at once. NOTE: This medicine is only for you. Do not share this medicine with others. What if I miss a dose? If you miss a dose, take it as soon as you can. If it is almost time for your next dose, take only that dose. Do not take double or extra doses. What may interact with this medicine? Do not take this medicine with any of the following medications: -certain medicines for fungal infections like fluconazole, itraconazole, ketoconazole, posaconazole, voriconazole -cisapride -dofetilide -dronedarone -linezolid -MAOIs like Carbex, Eldepryl, Marplan, Nardil, and Parnate -mesoridazine -methylene blue (injected into a vein) -pimozide -saquinavir -thioridazine -ziprasidone This medicine may also interact with the following medications: -alcohol -antiviral medicines for HIV or AIDS -aspirin and aspirin-like medicines -barbiturates like phenobarbital -certain medicines for blood pressure, heart disease, irregular heart beat -certain medicines for depression, anxiety, or psychotic disturbances -certain medicines for  migraine headache like almotriptan, eletriptan, frovatriptan, naratriptan, rizatriptan, sumatriptan, zolmitriptan -certain medicines for seizures like carbamazepine and phenytoin -certain medicines for sleep -certain medicines that treat or prevent blood clots like dalteparin, enoxaparin, warfarin -digoxin -fentanyl -lithium -NSAIDS, medicines for  pain and inflammation, like ibuprofen or naproxen -other medicines that prolong the QT interval (cause an abnormal heart rhythm) -rasagiline -supplements like St. John's wort, kava kava, valerian -tramadol -tryptophan This list may not describe all possible interactions. Give your health care provider a list of all the medicines, herbs, non-prescription drugs, or dietary supplements you use. Also tell them if you smoke, drink alcohol, or use illegal drugs. Some items may interact with your medicine. What should I watch for while using this medicine? Tell your doctor if your symptoms do not get better or if they get worse. Visit your doctor or health care professional for regular checks on your progress. Because it may take several weeks to see the full effects of this medicine, it is important to continue your treatment as prescribed by your doctor. Patients and their families should watch out for new or worsening thoughts of suicide or depression. Also watch out for sudden changes in feelings such as feeling anxious, agitated, panicky, irritable, hostile, aggressive, impulsive, severely restless, overly excited and hyperactive, or not being able to sleep. If this happens, especially at the beginning of treatment or after a change in dose, call your health care professional. Dennis Bast may get drowsy or dizzy. Do not drive, use machinery, or do anything that needs mental alertness until you know how this medicine affects you. Do not stand or sit up quickly, especially if you are an older patient. This reduces the risk of dizzy or fainting spells. Alcohol may interfere with the effect of this medicine. Avoid alcoholic drinks. This medicine may cause dry eyes and blurred vision. If you wear contact lenses you may feel some discomfort. Lubricating drops may help. See your eye doctor if the problem does not go away or is severe. Your mouth may get dry. Chewing sugarless gum, sucking hard candy and drinking plenty  of water may help. Contact your doctor if the problem does not go away or is severe. What side effects may I notice from receiving this medicine? Side effects that you should report to your doctor or health care professional as soon as possible: -allergic reactions like skin rash, itching or hives, swelling of the face, lips, or tongue -elevated mood, decreased need for sleep, racing thoughts, impulsive behavior -confusion -fast, irregular heartbeat -feeling faint or lightheaded, falls -feeling agitated, angry, or irritable -loss of balance or coordination -painful or prolonged erections -restlessness, pacing, inability to keep still -suicidal thoughts or other mood changes -tremors -trouble sleeping -seizures -unusual bleeding or bruising Side effects that usually do not require medical attention (report to your doctor or health care professional if they continue or are bothersome): -change in sex drive or performance -change in appetite or weight -constipation -headache -muscle aches or pains -nausea This list may not describe all possible side effects. Call your doctor for medical advice about side effects. You may report side effects to FDA at 1-800-FDA-1088. Where should I keep my medicine? Keep out of the reach of children. Store at room temperature between 15 and 30 degrees C (59 to 86 degrees F). Protect from light. Keep container tightly closed. Throw away any unused medicine after the expiration date. NOTE: This sheet is a summary. It may not cover all possible information. If you have questions  about this medicine, talk to your doctor, pharmacist, or health care provider.  2018 Elsevier/Gold Standard (2015-07-14 16:57:05)

## 2017-06-05 NOTE — Progress Notes (Signed)
Patient: Norma Johns Female    DOB: Nov 06, 1940   77 y.o.   MRN: [id number] Visit Date: 06/05/2017  Today's Provider: Mar Daring, PA-C   Chief Complaint  Patient presents with  . Depression   Subjective:    HPI Patient here today C/O of depression. She has had worsening depression since her breast cancer diagnosis last year. She then had her brother pass recently as well. She has been unhappy with her surgical incisions that is causing a lot of emotional stress on her. She is also having difficulty eating secondary to nothing tasting good, which she feels is secondary to the anastrozole. She also reports having difficulties sleeping as well. She reports she will take her alprazolam 0.5mg  to help her sleep, but is only sleeping 3-4 hours per night. She doesn't like for anything to put her to sleep because she wants to be able to wake up if her husband needs her.   Patient is requesting refill on albuterol and flonase.    Allergies  Allergen Reactions  . Chocolate Shortness Of Breath  . Other Shortness Of Breath and Swelling    PT STATES ALLERGY TO "SOME TYPE OF DYE THAT WAS USED AT [facility]."  SWELLING TO ARM AND SOB.  PT CANNOT REMEMBER WHAT THE DYE WAS USED FOR.  . Sulfa Antibiotics Other (See Comments)    unknown  . Atorvastatin Other (See Comments)    Leg pain   . Minocycline Hcl Itching  . Naproxen Other (See Comments)    Indigestion  . Niacin Other (See Comments)    Felt like she was on fire  . Septra [Sulfamethoxazole-Trimethoprim] Other (See Comments)    unknown  . Vioxx [Rofecoxib] Other (See Comments)    unknown  . Penicillins Itching and Rash    Has patient had a PCN reaction causing immediate rash, facial/tongue/throat swelling, SOB or lightheadedness with hypotension: Unknown Has patient had a PCN reaction causing severe rash involving mucus membranes or skin necrosis: Unknown Has patient had a PCN reaction that required  hospitalization: No Has patient had a PCN reaction occurring within the last 10 years: No If all of the above answers are "NO", then may proceed with Cephalosporin use.      Current Outpatient Medications:  .  albuterol (PROVENTIL HFA;VENTOLIN HFA) 108 (90 Base) MCG/ACT inhaler, Inhale 2 puffs into the lungs every 4 (four) hours as needed for wheezing or shortness of breath., Disp: 1 Inhaler, Rfl: 11 .  ALPRAZolam (XANAX) 0.5 MG tablet, Take 1 tablet (0.5 mg total) by mouth at bedtime as needed for anxiety., Disp: 30 tablet, Rfl: 5 .  anastrozole (ARIMIDEX) 1 MG tablet, TAKE (1) TABLET BY MOUTH EVERY DAY, Disp: 30 tablet, Rfl: 3 .  clotrimazole (LOTRIMIN) 1 % cream, Apply 1 application topically 2 (two) times daily., Disp: 30 g, Rfl: 0 .  fexofenadine (ALLEGRA) 180 MG tablet, Take 180 mg by mouth daily. PRN, Disp: , Rfl:  .  fluticasone (FLONASE) 50 MCG/ACT nasal spray, Place 2 sprays into both nostrils daily., Disp: 16 g, Rfl: 5 .  Multiple Vitamin (MULTIVITAMIN) capsule, Take 1 capsule by mouth daily., Disp: , Rfl:  .  pantoprazole (PROTONIX) 40 MG tablet, Take 1 tablet (40 mg total) 2 (two) times daily before a meal by mouth., Disp: 180 tablet, Rfl: 1 .  vitamin B-12 (CYANOCOBALAMIN) 1000 MCG tablet, Take 1,000 mcg by mouth daily., Disp: , Rfl:   Review of Systems  Constitutional: Positive  for activity change, appetite change and fatigue.  Respiratory: Negative.   Cardiovascular: Negative.   Gastrointestinal: Negative.   Psychiatric/Behavioral: Positive for agitation, dysphoric mood and sleep disturbance. The patient is nervous/anxious.     Social History   Tobacco Use  . Smoking status: Never Smoker  . Smokeless tobacco: Never Used  Substance Use Topics  . Alcohol use: No   Objective:   BP 124/74 (BP Location: Left Arm, Patient Position: Sitting, Cuff Size: Large)   Pulse 60   Temp 98.2 F (36.8 C) (Oral)   Resp 16   Ht 5\' 3"  (1.6 m)   Wt 167 lb (75.8 kg)   SpO2 99%    BMI 29.58 kg/m  Vitals:   06/05/17 1056  BP: 124/74  Pulse: 60  Resp: 16  Temp: 98.2 F (36.8 C)  TempSrc: Oral  SpO2: 99%  Weight: 167 lb (75.8 kg)  Height: 5\' 3"  (1.6 m)     Physical Exam  Constitutional: She appears well-developed and well-nourished. No distress.  Neck: Normal range of motion. Neck supple.  Cardiovascular: Normal rate, regular rhythm and normal heart sounds. Exam reveals no gallop and no friction rub.  No murmur heard. Pulmonary/Chest: Effort normal and breath sounds normal. No respiratory distress. She has no wheezes. She has no rales.  Skin: She is not diaphoretic.  Psychiatric: Her speech is normal and behavior is normal. Judgment and thought content normal. Her mood appears anxious. Cognition and memory are normal. She exhibits a depressed mood.  Vitals reviewed.      Assessment & Plan:     1. Difficulty sleeping Will restart trazodone as below but she is to take 25mg  (0.5 tab) nightly. I will see her back in 4 weeks to see how she is doing. She is to call the office if symptoms worsen before she is to return.  - traZODone (DESYREL) 50 MG tablet; Take 0.5-1 tablets (25-50 mg total) by mouth at bedtime as needed for sleep.  Dispense: 30 tablet; Refill: 0  2. Seasonal allergic rhinitis due to pollen Stable. Diagnosis pulled for medication refill. Continue current medical treatment plan. - fluticasone (FLONASE) 50 MCG/ACT nasal spray; Place 2 sprays into both nostrils daily.  Dispense: 16 g; Refill: 5  3. Reactive airway disease, mild intermittent, with acute exacerbation Stable. Diagnosis pulled for medication refill. Continue current medical treatment plan. - albuterol (PROVENTIL HFA;VENTOLIN HFA) 108 (90 Base) MCG/ACT inhaler; Inhale 2 puffs into the lungs every 4 (four) hours as needed for wheezing or shortness of breath.  Dispense: 1 Inhaler; Refill: 11  4. BMI 29.0-29.9,adult Counseled patient on healthy lifestyle modifications including dieting  and exercise.        Mar Daring, PA-C  [facility]

## 2017-06-06 ENCOUNTER — Ambulatory Visit: Payer: Medicare Other | Admitting: Occupational Therapy

## 2017-06-11 ENCOUNTER — Encounter: Payer: Self-pay | Admitting: Physician Assistant

## 2017-06-11 ENCOUNTER — Encounter (HOSPITAL_COMMUNITY): Payer: Self-pay

## 2017-06-11 ENCOUNTER — Ambulatory Visit (INDEPENDENT_AMBULATORY_CARE_PROVIDER_SITE_OTHER): Payer: Medicare Other | Admitting: Physician Assistant

## 2017-06-11 VITALS — BP 150/80 | HR 63 | Temp 97.9°F | Resp 16 | Wt 168.8 lb

## 2017-06-11 DIAGNOSIS — Z170 Estrogen receptor positive status [ER+]: Secondary | ICD-10-CM

## 2017-06-11 DIAGNOSIS — C50912 Malignant neoplasm of unspecified site of left female breast: Secondary | ICD-10-CM | POA: Diagnosis not present

## 2017-06-11 DIAGNOSIS — C50512 Malignant neoplasm of lower-outer quadrant of left female breast: Secondary | ICD-10-CM

## 2017-06-11 DIAGNOSIS — T8149XA Infection following a procedure, other surgical site, initial encounter: Secondary | ICD-10-CM

## 2017-06-11 DIAGNOSIS — I890 Lymphedema, not elsewhere classified: Secondary | ICD-10-CM

## 2017-06-11 DIAGNOSIS — Z22322 Carrier or suspected carrier of Methicillin resistant Staphylococcus aureus: Secondary | ICD-10-CM

## 2017-06-11 MED ORDER — CLINDAMYCIN PHOSPHATE 1 % EX LOTN: TOPICAL_LOTION | Freq: Two times a day (BID) | CUTANEOUS | 0 refills | Status: DC

## 2017-06-11 MED ORDER — TRIAMCINOLONE ACETONIDE 0.1 % EX CREA
1.0000 | TOPICAL_CREAM | Freq: Two times a day (BID) | CUTANEOUS | 0 refills | Status: DC
Start: 2017-06-11 — End: 2017-08-20

## 2017-06-11 NOTE — Progress Notes (Signed)
Patient: Norma Johns Female    DOB: 30-Aug-1940   77 y.o.   MRN: [id number] Visit Date: 06/11/2017  Today's Provider: Mar Daring, PA-C   No chief complaint on file.  Subjective:    HPI Patient here today with c/o soreness,swelling and drainage from the incision from her mastectomy she had 9 months ago. No fever.  Since her mastectomy she has had continued pain and inflammation around two incisions. One has since now closed, but she still will have occasional tightness and soreness from it, sometimes burning pain. The incision on the right chest wall will continue to flair with redness, soreness and occasional drainage. It has previously been cultured and was found to be MRSA. An [place] was performed to make sure there was no abscess or pocket of infection and was negative for this.   She was found to have invasive lobular carcinoma of the left breast with 19 of 19 lymph nodes positive. She is currently on Anastrozole. She has declined chemo and RXT. Her CA 27.29 has remained elevated but did decrease from 257.2 in September of last year to now at 93.5 on 05/13/17.   She has had significant pain and discomfort from scar tissue tightness as well as from lymphedema. She does have a sleeve for the left arm and just recently was measured and fitted for a pump. She has not yet received this but once she does she will be doing 1 hr twice daily. She does report the one time she did use the pump for an hour while testing she felt much better.   She is also having the hot flashes and mood swings from the Anastrozole. She reports she is handling this well as she has been through menopause before and "know I can handle it." She feels most of her emotional outbursts are secondary to her dissatisfaction with the surgical incisions. At this time she is refusing to see her surgeon again and is requesting a second opinion to make sure nothing else is going on.      Allergies  Allergen Reactions   . Chocolate Shortness Of Breath  . Other Shortness Of Breath and Swelling    PT STATES ALLERGY TO "SOME TYPE OF DYE THAT WAS USED AT [facility]."  SWELLING TO ARM AND SOB.  PT CANNOT REMEMBER WHAT THE DYE WAS USED FOR.  . Sulfa Antibiotics Other (See Comments)    unknown  . Atorvastatin Other (See Comments)    Leg pain   . Minocycline Hcl Itching  . Naproxen Other (See Comments)    Indigestion  . Niacin Other (See Comments)    Felt like she was on fire  . Septra [Sulfamethoxazole-Trimethoprim] Other (See Comments)    unknown  . Vioxx [Rofecoxib] Other (See Comments)    unknown  . Penicillins Itching and Rash    Has patient had a PCN reaction causing immediate rash, facial/tongue/throat swelling, SOB or lightheadedness with hypotension: Unknown Has patient had a PCN reaction causing severe rash involving mucus membranes or skin necrosis: Unknown Has patient had a PCN reaction that required hospitalization: No Has patient had a PCN reaction occurring within the last 10 years: No If all of the above answers are "NO", then may proceed with Cephalosporin use.      Current Outpatient Medications:  .  albuterol (PROVENTIL HFA;VENTOLIN HFA) 108 (90 Base) MCG/ACT inhaler, Inhale 2 puffs into the lungs every 4 (four) hours as needed for wheezing or shortness  of breath., Disp: 1 Inhaler, Rfl: 11 .  ALPRAZolam (XANAX) 0.5 MG tablet, Take 1 tablet (0.5 mg total) by mouth at bedtime as needed for anxiety., Disp: 30 tablet, Rfl: 5 .  anastrozole (ARIMIDEX) 1 MG tablet, TAKE (1) TABLET BY MOUTH EVERY DAY, Disp: 30 tablet, Rfl: 3 .  clotrimazole (LOTRIMIN) 1 % cream, Apply 1 application topically 2 (two) times daily., Disp: 30 g, Rfl: 0 .  fexofenadine (ALLEGRA) 180 MG tablet, Take 180 mg by mouth daily. PRN, Disp: , Rfl:  .  fluticasone (FLONASE) 50 MCG/ACT nasal spray, Place 2 sprays into both nostrils daily., Disp: 16 g, Rfl: 5 .  Multiple Vitamin (MULTIVITAMIN) capsule, Take 1 capsule by  mouth daily., Disp: , Rfl:  .  pantoprazole (PROTONIX) 40 MG tablet, Take 1 tablet (40 mg total) 2 (two) times daily before a meal by mouth., Disp: 180 tablet, Rfl: 1 .  traZODone (DESYREL) 50 MG tablet, Take 0.5-1 tablets (25-50 mg total) by mouth at bedtime as needed for sleep., Disp: 30 tablet, Rfl: 0 .  vitamin B-12 (CYANOCOBALAMIN) 1000 MCG tablet, Take 1,000 mcg by mouth daily., Disp: , Rfl:   Review of Systems  Constitutional: Positive for diaphoresis. Negative for fever.  Respiratory: Positive for chest tightness (lymphedema and scar tissue). Negative for shortness of breath.   Cardiovascular: Negative for chest pain, palpitations and leg swelling.  Skin: Positive for wound.    Social History   Tobacco Use  . Smoking status: Never Smoker  . Smokeless tobacco: Never Used  Substance Use Topics  . Alcohol use: No   Objective:   BP (!) 150/80 (BP Location: Right Arm, Patient Position: Sitting, Cuff Size: Normal)   Pulse 63   Temp 97.9 F (36.6 C) (Oral)   Resp 16   Wt 168 lb 12.8 oz (76.6 kg)   SpO2 99%   BMI 29.90 kg/m     Physical Exam  Constitutional: She appears well-developed and well-nourished. No distress.  Neck: Normal range of motion. Neck supple.  Cardiovascular: Normal rate, regular rhythm and normal heart sounds. Exam reveals no gallop and no friction rub.  No murmur heard. Pulmonary/Chest: Effort normal and breath sounds normal. No respiratory distress. She has no wheezes. She has no rales.    Lymphadenopathy:    She has no cervical adenopathy.  Skin: She is not diaphoretic.  Vitals reviewed.  CLINICAL DATA:  77 year old who underwent bilateral mastectomies approximately 6 months ago, presenting with a possible palpable mass associated pain and tenderness in the central chest located between the mastectomy scars.  EXAM: CHEST ULTRASOUND  COMPARISON:  None.  FINDINGS: Normal subcutaneous fat is present in the midline of the chest between  the mastectomy scars in the area of palpable concern. There is no abnormal fluid collection to suggest post surgical seroma or abscess. No shadowing mass or other focal abnormality is identified.  IMPRESSION: Normal appearing subcutaneous fat in the area of palpable concern in the midline of the chest between the mastectomy scars. No evidence of mass. No evidence of post surgical seroma, hematoma or abscess.   Electronically Signed   By: Evangeline Dakin M.D.   On: 04/22/2017 11:04     Assessment & Plan:     1. Postoperative cellulitis of surgical wound Patient has been putting peroxide on area. Advised to not use peroxide as it will kill off healthy cells after a certain time period. Since previous culture was sensitive to clindamycin, topical clindamycin will be sent in. May continue  to use clotrimazole and triamcinolone as well. Keep area dry. Avoid taping wound for a few days until other areas are healed. Discussed using gabapentin 100mg  once daily for nerve pain but patient wants to try the lymphedema pump first. Discussed with patient importance of following up with original surgeon for complaints but she refuses at this time. States "I am just so frustrated and feel like I am given the run around. Plus my family has seen me change and are so angry that I feel it is better to see someone else."  Referral will also be placed to [facility] at patient request for a second opinion. She is to call if symptoms worsen.  - clindamycin (CLEOCIN T) 1 % lotion; Apply topically 2 (two) times daily.  Dispense: 60 mL; Refill: 0 - Ambulatory referral to General Surgery - triamcinolone cream (KENALOG) 0.1 %; Apply 1 application topically 2 (two) times daily.  Dispense: 30 g; Refill: 0  2. Invasive lobular carcinoma of breast, stage 2, left ([place]) - Ambulatory referral to General Surgery  3. Malignant neoplasm of lower-outer quadrant of left breast of female, estrogen receptor positive  ([place]) - Ambulatory referral to General Surgery  4. Lymphedema - Ambulatory referral to General Surgery  5. MRSA (methicillin resistant staph aureus) culture positive - Ambulatory referral to [place], PA-C  [facility]

## 2017-06-11 NOTE — Progress Notes (Signed)
Patient cancelled appointment set for 06/10/17. Social worker encouraged her to call if she wants to reschedule appointment.

## 2017-06-12 ENCOUNTER — Ambulatory Visit: Payer: Medicare Other | Admitting: Occupational Therapy

## 2017-06-12 DIAGNOSIS — I972 Postmastectomy lymphedema syndrome: Secondary | ICD-10-CM | POA: Diagnosis not present

## 2017-06-12 DIAGNOSIS — M25612 Stiffness of left shoulder, not elsewhere classified: Secondary | ICD-10-CM

## 2017-06-12 DIAGNOSIS — M6281 Muscle weakness (generalized): Secondary | ICD-10-CM

## 2017-06-12 NOTE — Patient Instructions (Signed)
Pt to do stretch for R upper trap and scalenes Heat if needed prior   Drop shoulder , deep breathing and scapula squeezes  Wear compession sleeve and glove during day   Take breast pad back to [facility]- it is wrong one - unilateral mastectomy pad  Phone if pump fitted or correct breast pad comes in

## 2017-06-12 NOTE — Therapy (Signed)
[place] [facility] [address] [place], [address] Phone: [phone]   Fax:  [phone]  Occupational Therapy Treatment  Patient Details  Name: Norma Johns MRN: [id number] Date of Birth: 1941/01/30 Referring Provider: Fleet Contras   Encounter Date: 06/12/2017  OT End of Session - 06/12/17 1800    Visit Number  6    Number of Visits  10    Date for OT Re-Evaluation  07/03/17    OT Start Time  1522    OT Stop Time  1614    OT Time Calculation (min)  52 min    Activity Tolerance  Patient tolerated treatment well    Behavior During Therapy  [facility] for tasks assessed/performed       Past Medical History:  Diagnosis Date  . Anemia   . Anxiety   . Arthritis   . Asthma    WELL CONTROLLED  . Breast cancer ([place]) 2018   left breast cnacer  . Cancer ([place])    Bilat Mastectomy  . Depression   . GERD (gastroesophageal reflux disease)   . Headache    H/O  . Hyperlipidemia   . Sleep apnea    DOES NOT USE CPAP-COULD NOT TOLERATE  . Stroke [facility]) 2004    Past Surgical History:  Procedure Laterality Date  . ABDOMINAL HYSTERECTOMY    . BLADDER REPAIR    . BREAST BIOPSY Right 03/23/1991   Fibroadenoma with intramammary lymph node. Right breast, 9:00.  [facility] CHOLECYSTECTOMY    . COLONOSCOPY  2005  . MASTECTOMY MODIFIED RADICAL Left 10/01/2016   Procedure: MASTECTOMY MODIFIED RADICAL;  Surgeon: Robert Bellow, MD;  Location: [facility] ORS;  Service: General;  Laterality: Left;  . MR MRA CAROTID  02/2010   Minimal plaque formation; no significant stenosis. Sx: Syncope  . [facility]  . SENTINEL NODE BIOPSY Right 10/01/2016   Procedure: SENTINEL NODE BIOPSY;  Surgeon: Robert Bellow, MD;  Location: [facility] ORS;  Service: General;  Laterality: Right;  . SIMPLE MASTECTOMY WITH AXILLARY SENTINEL NODE BIOPSY Right 10/01/2016   Procedure: SIMPLE MASTECTOMY;  Surgeon: Robert Bellow, MD;  Location:  [facility] ORS;  Service: General;  Laterality: Right;    There were no vitals filed for this visit.  Subjective Assessment - 06/12/17 1754    Subjective   Pt report that her scar on R chest was draining again -and that tape cause some outbreak -and stopped - she did pick up her compression sleeve and breast pad - and brought for me to fit - and then she feels so much tightness in chest and even her shoulders and neck hurting - since yesterday am - seen her Dr and going to refer her to another Pension scheme manager for 2nd opinion  - report that after demo of pump she felt so good in her chest , under arm and arm     Patient Stated Goals  I want to get this tightness in my chest better to reach over head with less pain , more strength in  L arm to help my husband , and  the swelling better     Currently in Pain?  Yes    Pain Score  6     Pain Location  Neck    Pain Orientation  Right    Pain Descriptors / Indicators  Tender;Tightness;Shooting    Pain Onset  Yesterday  Pt report that the R scar on chest started draining again since last time  Did tape but had some skin irritation -  Stop taping   did see her MD and going to be refer to another plastic surgeon Pt report that her chest and under  Arm felt great after use /demo of pump last week -  Pt arrivew with her compression garments that she picked [facility] from [facility]   Assess fit and ed pt on donning and wearing of daytime compression and glove Juzo soft   ed on using slippy gaotor - that she can order form [facility]     Pt to take breast pad back to [facility]- it is wrong one - unilateral mastectomy pad  Phone if pump fitted or correct breast pad comes in   Pt has cervical pain on R upper trap , and scalenes   pain with rotation and lateral flexion on R  Done heat 8 min  And then Graston tool nr 2 and 4 for sweeping and brushing prior to  Stretches   Pt to do stretch for R upper trap and scalenes Heat if needed prior   Drop shoulder ,  deep breathing and scapula squeezes  Wear compession sleeve and glove during day            OT Treatments/Exercises (OP) - 06/12/17 0001      Moist Heat Therapy   Number Minutes Moist Heat  8 Minutes    Moist Heat Location  Cervical      Ultrasound   Ultrasound Location  upper trap R     Ultrasound Parameters  3.34mHZ, 0.8 intensity ,  20%     Ultrasound Goals  Pain             OT Education - 06/12/17 1800    Education provided  Yes    Education Details  HEP for upper traps and scalens stretch - wearing of compression sleeves     Person(s) Educated  Patient    Methods  Explanation;Demonstration;Tactile cues    Comprehension  Verbalized understanding;Returned demonstration       OT Short Term Goals - 05/22/17 1934      OT SHORT TERM GOAL #1   Title  L UE circumference decrease by 1 cm at L wrist and forearm and 2-3 cm at upperarm to be fitted with compression garments to keep lymphedema under control     Baseline  see circumference measurements  - did increase while she was getting medical care for scar and infection     Time  3    Period  Weeks    Status  On-going    Target Date  06/12/17      OT SHORT TERM GOAL #2   Title  L AROM improve with WFL with decrease symptoms of tightness , pull less than 3/10     Baseline  in shoulder improve but still over chest     Status  Achieved        OT Long Term Goals - 05/22/17 1935      OT LONG TERM GOAL #1   Title  Strength in L UE at elbow and shoulder increase to 4/5 to be able to perform ADL's and helping her husband with more ease     Status  Achieved      OT LONG TERM GOAL #2   Title  Assess if compression , MLD decrease waist circumference by 5 cm     Baseline  await compression garments     Time  6    Period  Weeks    Status  On-going    Target Date  06/05/17      OT LONG TERM GOAL #3   Title  Pt to be ind in donning, doffing and wearing correct compression to decrease lymphedema     Baseline  did  not recieve yet    Time  6    Period  Weeks    Status  On-going    Target Date  06/05/17            Plan - 06/12/17 1801    Clinical Impression Statement  Pt report this date tightness in chest and that her scar on R chest had since last time drainage - stopped tape because of rash - pt was fitted this date with her Juzu soft day sleeve and glove - appear to fit well - but [facility] ordered wrong breast pad - pt to take back and  provided note for correct - unilateral mastectomy pad - -pt report that pump demo felt great and tightness and swellilng in chest and under arm felt much better - awaiting approval from insurance - pt  with some R neck pain since yesterday Am - pt was ed on HEP and reminded again on posture     Occupational performance deficits (Please refer to evaluation for details):  ADL's;IADL's;Leisure    Rehab Potential  Good    OT Frequency  1x / week    OT Duration  4 weeks    OT Treatment/Interventions  Self-care/ADL training;Compression bandaging;Manual lymph drainage;Scar mobilization;Therapeutic exercise;Passive range of motion;Manual Therapy    Plan  assess how doing with pump and daytime compression     Clinical Decision Making  Several treatment options, min-mod task modification necessary    OT Home Exercise Plan  see pt instruction     Consulted and Agree with Plan of Care  Patient       Patient will benefit from skilled therapeutic intervention in order to improve the following deficits and impairments:  Increased edema, Pain, Decreased scar mobility, Decreased strength, Impaired UE functional use  Visit Diagnosis: Postmastectomy lymphedema syndrome  Muscle weakness  Stiffness of left shoulder, not elsewhere classified    Problem List Patient Active Problem List   Diagnosis Date Noted  . MRSA (methicillin resistant staph aureus) culture positive 04/05/2017  . Lymphedema 02/27/2017  . Shoulder weakness 02/27/2017  . Breast cancer ([place]) 10/01/2016   . Invasive lobular carcinoma of breast, stage 2, left ([place]) 09/18/2016  . Malignant neoplasm of lower-outer quadrant of left breast of female, estrogen receptor positive ([place]) 08/25/2016  . History of CVA with residual deficit 04/17/2016  . Low back pain 06/13/2015  . Anxiety 08/24/2014  . Atrophic vaginitis 08/24/2014  . Body mass index (BMI) of 29.0-29.9 in adult 08/24/2014  . Cyst of nasal sinus 08/24/2014  . Degenerative joint disease 08/24/2014  . OP (osteoporosis) 08/24/2014  . Bulging eyes 08/24/2014  . GERD (gastroesophageal reflux disease) 08/16/2014  . History of colon polyps 09/21/2008  . Chronic recurrent sinusitis 09/17/2008  . Diabetes mellitus, type 2 ([place]) 08/03/2008  . Hypercholesteremia 06/22/2008  . Cannot sleep 03/23/2008  . Allergic rhinitis 01/08/2008  . Airway hyperreactivity 01/08/2008  . Carotid artery obstruction 01/08/2008  . Clinical depression 01/08/2008  . Benign hypertension 01/08/2008    Rosalyn Gess OTR/L,CLT 06/12/2017, 7:24 PM  [place] [facility] [address] [place], [address] Phone: [phone]   Fax:  [phone]  Name: VANIYA AUGSPURGER MRN: [id number] Date of Birth: Aug 23, 1940

## 2017-07-02 ENCOUNTER — Other Ambulatory Visit: Payer: Self-pay | Admitting: Physician Assistant

## 2017-07-02 DIAGNOSIS — K219 Gastro-esophageal reflux disease without esophagitis: Secondary | ICD-10-CM

## 2017-07-02 NOTE — Telephone Encounter (Signed)
Please review. Thanks!  

## 2017-07-02 NOTE — Telephone Encounter (Signed)
Pt contacted office for refill request on the following medications:  pantoprazole (PROTONIX) 40 MG tablet  [name]  Last Rx: 01/03/17 LOV: 06/11/17 Pt stated she is out of the medication.  Please advise. Thanks TNP

## 2017-07-03 MED ORDER — PANTOPRAZOLE SODIUM 40 MG PO TBEC: 40.0000 mg | DELAYED_RELEASE_TABLET | Freq: Two times a day (BID) | ORAL | 1 refills | Status: AC

## 2017-07-08 ENCOUNTER — Ambulatory Visit: Payer: Self-pay | Admitting: Physician Assistant

## 2017-07-09 ENCOUNTER — Ambulatory Visit: Payer: Medicare Other | Attending: General Surgery | Admitting: Occupational Therapy

## 2017-07-09 DIAGNOSIS — M6281 Muscle weakness (generalized): Secondary | ICD-10-CM | POA: Diagnosis not present

## 2017-07-09 DIAGNOSIS — M25612 Stiffness of left shoulder, not elsewhere classified: Secondary | ICD-10-CM | POA: Insufficient documentation

## 2017-07-09 DIAGNOSIS — I972 Postmastectomy lymphedema syndrome: Secondary | ICD-10-CM | POA: Insufficient documentation

## 2017-07-09 NOTE — Patient Instructions (Signed)
Pt to wear compression sleeve and glove during day  And jovipak under bra or camisole as needed -  Await insurance and pink ribbon fund - to get pump for her

## 2017-07-09 NOTE — Therapy (Signed)
[facility] [address] [place], [address] Phone: [phone]   Fax:  [phone]  Occupational Therapy Treatment  Patient Details  Name: Norma Johns MRN: [id number] Date of Birth: 1940/06/16 Referring Provider: Fleet Contras   Encounter Date: 07/09/2017  OT End of Session - 07/09/17 1121    Visit Number  7    Number of Visits  10    Date for OT Re-Evaluation  07/31/17    OT Start Time  0847    OT Stop Time  0916    OT Time Calculation (min)  29 min    Activity Tolerance  Patient tolerated treatment well    Behavior During Therapy  [facility] for tasks assessed/performed       Past Medical History:  Diagnosis Date  . Anemia   . Anxiety   . Arthritis   . Asthma    WELL CONTROLLED  . Breast cancer ([place]) 2018   left breast cnacer  . Cancer ([place])    Bilat Mastectomy  . Depression   . GERD (gastroesophageal reflux disease)   . Headache    H/O  . Hyperlipidemia   . Sleep apnea    DOES NOT USE CPAP-COULD NOT TOLERATE  . Stroke [facility]) 2004    Past Surgical History:  Procedure Laterality Date  . ABDOMINAL HYSTERECTOMY    . BLADDER REPAIR    . BREAST BIOPSY Right 03/23/1991   Fibroadenoma with intramammary lymph node. Right breast, 9:00.  [facility] CHOLECYSTECTOMY    . COLONOSCOPY  2005  . MASTECTOMY MODIFIED RADICAL Left 10/01/2016   Procedure: MASTECTOMY MODIFIED RADICAL;  Surgeon: Robert Bellow, MD;  Location: [facility] ORS;  Service: General;  Laterality: Left;  . MR MRA CAROTID  02/2010   Minimal plaque formation; no significant stenosis. Sx: Syncope  . [facility]  . SENTINEL NODE BIOPSY Right 10/01/2016   Procedure: SENTINEL NODE BIOPSY;  Surgeon: Robert Bellow, MD;  Location: [facility] ORS;  Service: General;  Laterality: Right;  . SIMPLE MASTECTOMY WITH AXILLARY SENTINEL NODE BIOPSY Right 10/01/2016   Procedure: SIMPLE MASTECTOMY;  Surgeon: Robert Bellow, MD;  Location:  [facility] ORS;  Service: General;  Laterality: Right;    There were no vitals filed for this visit.  Subjective Assessment - 07/09/17 1112    Subjective   I picked up my sleeve and then my jovipak breast pad -can you check my breast pad - I think it need to be larger -  my chest just tight but area is not weaping - I see surgeon Monday - we still waiting for pump - for my insurance     Patient Stated Goals  I want to get this tightness in my chest better to reach over head with less pain , more strength in  L arm to help my husband , and  the swelling better     Currently in Pain?  No/denies       Pt report that her chest still feels tight and swollen from under L arm to R breast or chest   have appt with another surgeon on Monday for 2nd opinion   no drainage in 2 places on chest  = but pt has pocket over anterior chest and under arm of swelling   Pt report that her chest and under  Arm felt great after use /demo of pump last week - await insurance and [employer] fund  help for copay for pump   Pt arrivew with her juzo soft  daytime compression  On a- fit well  Juzo soft   ed on using slippy gaotor - that she can order form [employer]  Last time     Pt fitted with and ed on using of jovipak breast pad - unilateral mastectomy pad - under bra or camisole and husband can help for back part  size fit well - she do not need larger one    Contact SW - this date to contact pt again - she miss appt with SW because of death in family - did provide pt with name                  OT Education - 07/09/17 1120    Education provided  Yes    Education Details  ed on use of jovipak breast pad - and sleeve     Person(s) Educated  Patient    Methods  Explanation;Demonstration    Comprehension  Verbalized understanding;Returned demonstration       OT Short Term Goals - 05/22/17 1934      OT SHORT TERM GOAL #1   Title  L UE circumference decrease by 1 cm at L wrist and forearm and 2-3 cm  at upperarm to be fitted with compression garments to keep lymphedema under control     Baseline  see circumference measurements  - did increase while she was getting medical care for scar and infection     Time  3    Period  Weeks    Status  On-going    Target Date  06/12/17      OT SHORT TERM GOAL #2   Title  L AROM improve with WFL with decrease symptoms of tightness , pull less than 3/10     Baseline  in shoulder improve but still over chest     Status  Achieved        OT Long Term Goals - 05/22/17 1935      OT LONG TERM GOAL #1   Title  Strength in L UE at elbow and shoulder increase to 4/5 to be able to perform ADL's and helping her husband with more ease     Status  Achieved      OT LONG TERM GOAL #2   Title  Assess if compression , MLD decrease waist circumference by 5 cm     Baseline  await compression garments     Time  6    Period  Weeks    Status  On-going    Target Date  06/05/17      OT LONG TERM GOAL #3   Title  Pt to be ind in donning, doffing and wearing correct compression to decrease lymphedema     Baseline  did not recieve yet    Time  6    Period  Weeks    Status  On-going    Target Date  06/05/17            Plan - 07/09/17 1121    Clinical Impression Statement  Pt wear her Juzo soft  day sleeve and glove this date coming in - measurements in forearm and hand was good - but upper arm still increase - did ed pt on use of Jovipak unilateral mastectomy pad - pt show understanding - still waiting for insurance and [employer] for  working out payment of copay for her pump  -  she responded great for tightness , swelling in chest and under arm with  demo of pump - pt to use jovipak that should help too for tightness and swelling in chest and under L arm     Occupational performance deficits (Please refer to evaluation for details):  ADL's;IADL's;Leisure    Rehab Potential  Good    OT Frequency  1x / week    OT Duration  4 weeks    OT  Treatment/Interventions  Self-care/ADL training;Compression bandaging;Manual lymph drainage;Scar mobilization;Therapeutic exercise;Passive range of motion;Manual Therapy    Plan  Will follow up with pt after pump fitting     Clinical Decision Making  Several treatment options, min-mod task modification necessary    OT Home Exercise Plan  see pt instruction     Consulted and Agree with Plan of Care  Patient       Patient will benefit from skilled therapeutic intervention in order to improve the following deficits and impairments:  Increased edema, Pain, Decreased scar mobility, Decreased strength, Impaired UE functional use  Visit Diagnosis: Postmastectomy lymphedema syndrome - Plan: Ot plan of care cert/re-cert  Muscle weakness - Plan: Ot plan of care cert/re-cert  Stiffness of left shoulder, not elsewhere classified - Plan: Ot plan of care cert/re-cert    Problem List Patient Active Problem List   Diagnosis Date Noted  . MRSA (methicillin resistant staph aureus) culture positive 04/05/2017  . Lymphedema 02/27/2017  . Shoulder weakness 02/27/2017  . Breast cancer ([place]) 10/01/2016  . Invasive lobular carcinoma of breast, stage 2, left ([place]) 09/18/2016  . Malignant neoplasm of lower-outer quadrant of left breast of female, estrogen receptor positive ([place]) 08/25/2016  . History of CVA with residual deficit 04/17/2016  . Low back pain 06/13/2015  . Anxiety 08/24/2014  . Atrophic vaginitis 08/24/2014  . Body mass index (BMI) of 29.0-29.9 in adult 08/24/2014  . Cyst of nasal sinus 08/24/2014  . Degenerative joint disease 08/24/2014  . OP (osteoporosis) 08/24/2014  . Bulging eyes 08/24/2014  . GERD (gastroesophageal reflux disease) 08/16/2014  . History of colon polyps 09/21/2008  . Chronic recurrent sinusitis 09/17/2008  . Diabetes mellitus, type 2 ([place]) 08/03/2008  . Hypercholesteremia 06/22/2008  . Cannot sleep 03/23/2008  . Allergic rhinitis 01/08/2008  . Airway  hyperreactivity 01/08/2008  . Carotid artery obstruction 01/08/2008  . Clinical depression 01/08/2008  . Benign hypertension 01/08/2008    Rosalyn Gess OTR/L,CLT 07/09/2017, 11:28 AM  [facility] [address], [place], [address] Phone: [phone]   Fax:  [phone]  Name: LORI POPOWSKI MRN: [id number] Date of Birth: 1940-11-03

## 2017-07-10 ENCOUNTER — Ambulatory Visit (INDEPENDENT_AMBULATORY_CARE_PROVIDER_SITE_OTHER): Payer: Medicare Other | Admitting: Physician Assistant

## 2017-07-10 ENCOUNTER — Encounter: Payer: Self-pay | Admitting: Physician Assistant

## 2017-07-10 VITALS — BP 140/86 | HR 67 | Temp 97.8°F | Resp 16 | Wt 169.0 lb

## 2017-07-10 DIAGNOSIS — R300 Dysuria: Secondary | ICD-10-CM | POA: Diagnosis not present

## 2017-07-10 DIAGNOSIS — N3000 Acute cystitis without hematuria: Secondary | ICD-10-CM | POA: Diagnosis not present

## 2017-07-10 DIAGNOSIS — H66001 Acute suppurative otitis media without spontaneous rupture of ear drum, right ear: Secondary | ICD-10-CM

## 2017-07-10 DIAGNOSIS — F5101 Primary insomnia: Secondary | ICD-10-CM

## 2017-07-10 LAB — POCT URINALYSIS DIPSTICK
BILIRUBIN UA: NEGATIVE
Glucose, UA: NEGATIVE
KETONES UA: NEGATIVE
NITRITE UA: NEGATIVE
PROTEIN UA: NEGATIVE
Spec Grav, UA: 1.015 (ref 1.010–1.025)
Urobilinogen, UA: 0.2 E.U./dL
pH, UA: 6.5 (ref 5.0–8.0)

## 2017-07-10 MED ORDER — PHENAZOPYRIDINE HCL 200 MG PO TABS: 200.0000 mg | ORAL_TABLET | Freq: Three times a day (TID) | ORAL | 0 refills | Status: DC | PRN

## 2017-07-10 MED ORDER — NITROFURANTOIN MONOHYD MACRO 100 MG PO CAPS: 100.0000 mg | ORAL_CAPSULE | Freq: Two times a day (BID) | ORAL | 0 refills | Status: DC

## 2017-07-10 NOTE — Progress Notes (Signed)
Patient: Norma Johns Female    DOB: 24-Jun-1940   77 y.o.   MRN: [id number] Visit Date: 07/10/2017  Today's Provider: Mar Daring, PA-C   Chief Complaint  Patient presents with  . Depression   Subjective:    HPI  Depression, Follow-up  She  was last seen for this 1 months ago. Changes made at last visit include restart Trazodone.   She reports poor compliance with treatment. She is having side effects. Bad dreams Discontinued.  She reports poor tolerance of treatment. Current symptoms include: mad, irritated She feels she is Unchanged since last visit. -----------------------------------------------------------------------  Patient C/O frequent urination and abdominal pressure x's over one week. Patient denies burning, or vaginal discharge.  Patient C/O cough, sore throat, and right ear pain x's one week.     Allergies  Allergen Reactions  . Chocolate Shortness Of Breath  . Other Shortness Of Breath and Swelling    PT STATES ALLERGY TO "SOME TYPE OF DYE THAT WAS USED AT [facility]."  SWELLING TO ARM AND SOB.  PT CANNOT REMEMBER WHAT THE DYE WAS USED FOR.  . Sulfa Antibiotics Other (See Comments)    unknown  . Atorvastatin Other (See Comments)    Leg pain   . Minocycline Hcl Itching  . Naproxen Other (See Comments)    Indigestion  . Niacin Other (See Comments)    Felt like she was on fire  . Septra [Sulfamethoxazole-Trimethoprim] Other (See Comments)    unknown  . Vioxx [Rofecoxib] Other (See Comments)    unknown  . Penicillins Itching and Rash    Has patient had a PCN reaction causing immediate rash, facial/tongue/throat swelling, SOB or lightheadedness with hypotension: Unknown Has patient had a PCN reaction causing severe rash involving mucus membranes or skin necrosis: Unknown Has patient had a PCN reaction that required hospitalization: No Has patient had a PCN reaction occurring within the last 10 years: No If all of the above  answers are "NO", then may proceed with Cephalosporin use.      Current Outpatient Medications:  .  albuterol (PROVENTIL HFA;VENTOLIN HFA) 108 (90 Base) MCG/ACT inhaler, Inhale 2 puffs into the lungs every 4 (four) hours as needed for wheezing or shortness of breath., Disp: 1 Inhaler, Rfl: 11 .  ALPRAZolam (XANAX) 0.5 MG tablet, Take 1 tablet (0.5 mg total) by mouth at bedtime as needed for anxiety., Disp: 30 tablet, Rfl: 5 .  anastrozole (ARIMIDEX) 1 MG tablet, TAKE (1) TABLET BY MOUTH EVERY DAY, Disp: 30 tablet, Rfl: 3 .  clindamycin (CLEOCIN T) 1 % lotion, Apply topically 2 (two) times daily., Disp: 60 mL, Rfl: 0 .  clotrimazole (LOTRIMIN) 1 % cream, Apply 1 application topically 2 (two) times daily., Disp: 30 g, Rfl: 0 .  fexofenadine (ALLEGRA) 180 MG tablet, Take 180 mg by mouth daily. PRN, Disp: , Rfl:  .  fluticasone (FLONASE) 50 MCG/ACT nasal spray, Place 2 sprays into both nostrils daily., Disp: 16 g, Rfl: 5 .  Multiple Vitamin (MULTIVITAMIN) capsule, Take 1 capsule by mouth daily., Disp: , Rfl:  .  pantoprazole (PROTONIX) 40 MG tablet, Take 1 tablet (40 mg total) by mouth 2 (two) times daily before a meal., Disp: 180 tablet, Rfl: 1 .  triamcinolone cream (KENALOG) 0.1 %, Apply 1 application topically 2 (two) times daily., Disp: 30 g, Rfl: 0 .  vitamin B-12 (CYANOCOBALAMIN) 1000 MCG tablet, Take 1,000 mcg by mouth daily., Disp: , Rfl:  .  traZODone (  DESYREL) 50 MG tablet, Take 0.5-1 tablets (25-50 mg total) by mouth at bedtime as needed for sleep. (Patient not taking: Reported on 07/10/2017), Disp: 30 tablet, Rfl: 0  Review of Systems  Constitutional: Negative.   HENT: Positive for ear pain, sinus pressure, sinus pain and sore throat.   Respiratory: Positive for cough.   Cardiovascular: Negative.   Gastrointestinal: Positive for abdominal distention, abdominal pain and nausea. Negative for constipation, diarrhea and vomiting.  Genitourinary: Positive for frequency and pelvic pain.    Allergic/Immunologic: Positive for environmental allergies.  Neurological: Negative.   Psychiatric/Behavioral: Positive for dysphoric mood. The patient is nervous/anxious.     Social History   Tobacco Use  . Smoking status: Never Smoker  . Smokeless tobacco: Never Used  Substance Use Topics  . Alcohol use: No   Objective:   BP 140/86 (BP Location: Right Arm, Patient Position: Sitting, Cuff Size: Large)   Pulse 67   Temp 97.8 F (36.6 C) (Oral)   Resp 16   Wt 169 lb (76.7 kg)   SpO2 97%   BMI 29.94 kg/m  Vitals:   07/10/17 1028  BP: 140/86  Pulse: 67  Resp: 16  Temp: 97.8 F (36.6 C)  TempSrc: Oral  SpO2: 97%  Weight: 169 lb (76.7 kg)     Physical Exam  Constitutional: She is oriented to person, place, and time. She appears well-developed and well-nourished. No distress.  HENT:  Head: Normocephalic and atraumatic.  Right Ear: Hearing, external ear and ear canal normal. Tympanic membrane is erythematous and bulging. A middle ear effusion is present.  Left Ear: Hearing, tympanic membrane, external ear and ear canal normal.  Nose: Nose normal.  Mouth/Throat: Uvula is midline, oropharynx is clear and moist and mucous membranes are normal. No oropharyngeal exudate.  Eyes: Pupils are equal, round, and reactive to light. Conjunctivae are normal. Right eye exhibits no discharge. Left eye exhibits no discharge. No scleral icterus.  Neck: Normal range of motion. Neck supple. No tracheal deviation present. No thyromegaly present.  Cardiovascular: Normal rate, regular rhythm and normal heart sounds. Exam reveals no gallop and no friction rub.  No murmur heard. Pulmonary/Chest: Effort normal and breath sounds normal. No stridor. No respiratory distress. She has no wheezes. She has no rales.  Abdominal: Soft. Normal appearance and bowel sounds are normal. She exhibits no distension and no mass. There is no hepatosplenomegaly. There is tenderness in the suprapubic area. There is no  rebound, no guarding and no CVA tenderness.  Lymphadenopathy:    She has no cervical adenopathy.  Neurological: She is alert and oriented to person, place, and time.  Skin: Skin is warm and dry. She is not diaphoretic.  Vitals reviewed.      Assessment & Plan:     1. Acute cystitis without hematuria Worsening symptoms. UA positive. Will treat empirically with Macrobid due to patient allergies as below. Pyridium given for spasm. Continue to push fluids. Urine sent for culture. Will follow up pending C&S results. She is to call if symptoms do not improve or if they worsen.  - Urine Culture - nitrofurantoin, macrocrystal-monohydrate, (MACROBID) 100 MG capsule; Take 1 capsule (100 mg total) by mouth 2 (two) times daily.  Dispense: 20 capsule; Refill: 0 - phenazopyridine (PYRIDIUM) 200 MG tablet; Take 1 tablet (200 mg total) by mouth 3 (three) times daily as needed for pain.  Dispense: 20 tablet; Refill: 0  2. Dysuria See above medical treatment plan. - POCT urinalysis dipstick - phenazopyridine (PYRIDIUM) 200  MG tablet; Take 1 tablet (200 mg total) by mouth 3 (three) times daily as needed for pain.  Dispense: 20 tablet; Refill: 0  3. Acute suppurative otitis media of right ear without spontaneous rupture of tympanic membrane, recurrence not specified Patient prefers to treat UTI first due to severity of symptoms. Will treat otitis symptomatically. Advised may benefit from Flonase OTC.  4. Primary insomnia Improving. Trazodone d/c due to side effects of bad dreams.        Mar Daring, PA-C  [facility]

## 2017-07-12 ENCOUNTER — Encounter: Payer: Self-pay | Admitting: Physician Assistant

## 2017-07-12 ENCOUNTER — Telehealth: Payer: Self-pay

## 2017-07-12 LAB — URINE CULTURE

## 2017-07-12 NOTE — Telephone Encounter (Signed)
Patient advised as below.  

## 2017-07-12 NOTE — Telephone Encounter (Signed)
I hate that this has not worked. I don't know exactly what would be best on a Friday this late. I can try to see if we can get the CT that Dr. Tasia Catchings has ordered sooner, however that would still only be as early as next week. A CT is what I think she will need. If it does continue to get worse though, she may need to go to the ER. She may stop the antibiotic also since it is not helping.

## 2017-07-12 NOTE — Telephone Encounter (Signed)
Mrs. Norma Johns called C/O recurrent lower abdominal pressure, and rectal spasms with bowel movements. Patient wants to know if there is any other test like a ultrasound that would help identify the cause of her symptoms. Patient reports that antibiotic have not helped with symptoms. Patient reports pain is worsening and does not want to go the weekend with the pain. Patient reports she does not want to go to ER.

## 2017-07-12 NOTE — Telephone Encounter (Signed)
-----   Message from Mar Daring, [place] sent at 07/12/2017 11:36 AM EDT ----- Urine culture is negative but if symptoms improving continue antibiotic until completed.

## 2017-07-15 DIAGNOSIS — C50912 Malignant neoplasm of unspecified site of left female breast: Secondary | ICD-10-CM | POA: Diagnosis not present

## 2017-07-16 ENCOUNTER — Other Ambulatory Visit: Payer: Self-pay | Admitting: General Surgery

## 2017-07-16 DIAGNOSIS — C50912 Malignant neoplasm of unspecified site of left female breast: Secondary | ICD-10-CM

## 2017-07-17 ENCOUNTER — Ambulatory Visit
Admission: RE | Admit: 2017-07-17 | Discharge: 2017-07-17 | Disposition: A | Discharge status: Home / Self Care | Payer: Medicare Other | Source: Ambulatory Visit | Attending: General Surgery | Admitting: General Surgery

## 2017-07-17 DIAGNOSIS — C50912 Malignant neoplasm of unspecified site of left female breast: Secondary | ICD-10-CM

## 2017-07-17 DIAGNOSIS — N200 Calculus of kidney: Secondary | ICD-10-CM | POA: Insufficient documentation

## 2017-07-17 DIAGNOSIS — J90 Pleural effusion, not elsewhere classified: Secondary | ICD-10-CM | POA: Diagnosis not present

## 2017-07-17 DIAGNOSIS — C786 Secondary malignant neoplasm of retroperitoneum and peritoneum: Secondary | ICD-10-CM | POA: Diagnosis not present

## 2017-07-17 LAB — POCT I-STAT CREATININE: CREATININE: 0.7 mg/dL (ref 0.44–1.00)

## 2017-07-17 MED ORDER — IOPAMIDOL (ISOVUE-300) INJECTION 61%
100.0000 mL | Freq: Once | INTRAVENOUS | Status: AC | PRN
  Administered 2017-07-17: 100 mL via INTRAVENOUS

## 2017-07-18 ENCOUNTER — Encounter (HOSPITAL_COMMUNITY): Payer: Self-pay

## 2017-07-18 NOTE — Progress Notes (Unsigned)
Social worker met with patient and provided counseling services in the Cancer cemter.

## 2017-07-19 NOTE — Progress Notes (Signed)
  Oncology Nurse Navigator Documentation  Navigator Location: [facility]-Med Onc (07/18/17 1600)   )Navigator Encounter Type: Telephone;Follow-up Appt (07/18/17 1600) Telephone: Outgoing Call;Appt Confirmation/Clarification (07/18/17 1600)                   Patient Visit Type: Follow-up;MedOnc (07/18/17 1600) Treatment Phase: Active Tx (07/18/17 1600) Barriers/Navigation Needs: Coordination of Care (07/18/17 1600)   Interventions: Coordination of Care (07/18/17 1600)   Coordination of Care: Appts (07/18/17 1600)                  Time Spent with Patient: 30 (07/18/17 1600)   Per Dr Collie Siad request, patient scheduled for appointment on 07/23/17 at 11:00  To discuss treatment.  Patient notified.  Patient stated her visit with counselor today was very helpful.

## 2017-07-22 DIAGNOSIS — Z7189 Other specified counseling: Secondary | ICD-10-CM | POA: Insufficient documentation

## 2017-07-23 ENCOUNTER — Other Ambulatory Visit: Payer: Self-pay

## 2017-07-23 ENCOUNTER — Encounter: Payer: Self-pay | Admitting: Oncology

## 2017-07-23 ENCOUNTER — Inpatient Hospital Stay: Payer: Medicare Other

## 2017-07-23 ENCOUNTER — Inpatient Hospital Stay: Payer: Medicare Other | Attending: Oncology | Admitting: Oncology

## 2017-07-23 VITALS — BP 147/87 | HR 84 | Temp 98.1°F | Resp 20 | Ht 63.0 in | Wt 171.9 lb

## 2017-07-23 DIAGNOSIS — M8580 Other specified disorders of bone density and structure, unspecified site: Secondary | ICD-10-CM | POA: Diagnosis not present

## 2017-07-23 DIAGNOSIS — Z806 Family history of leukemia: Secondary | ICD-10-CM | POA: Diagnosis not present

## 2017-07-23 DIAGNOSIS — R918 Other nonspecific abnormal finding of lung field: Secondary | ICD-10-CM | POA: Diagnosis not present

## 2017-07-23 DIAGNOSIS — Z8673 Personal history of transient ischemic attack (TIA), and cerebral infarction without residual deficits: Secondary | ICD-10-CM | POA: Insufficient documentation

## 2017-07-23 DIAGNOSIS — Z79811 Long term (current) use of aromatase inhibitors: Secondary | ICD-10-CM | POA: Diagnosis not present

## 2017-07-23 DIAGNOSIS — Z9011 Acquired absence of right breast and nipple: Secondary | ICD-10-CM | POA: Diagnosis not present

## 2017-07-23 DIAGNOSIS — F418 Other specified anxiety disorders: Secondary | ICD-10-CM | POA: Diagnosis not present

## 2017-07-23 DIAGNOSIS — Z170 Estrogen receptor positive status [ER+]: Principal | ICD-10-CM

## 2017-07-23 DIAGNOSIS — C50919 Malignant neoplasm of unspecified site of unspecified female breast: Secondary | ICD-10-CM

## 2017-07-23 DIAGNOSIS — Z801 Family history of malignant neoplasm of trachea, bronchus and lung: Secondary | ICD-10-CM | POA: Diagnosis not present

## 2017-07-23 DIAGNOSIS — K219 Gastro-esophageal reflux disease without esophagitis: Secondary | ICD-10-CM | POA: Insufficient documentation

## 2017-07-23 DIAGNOSIS — R180 Malignant ascites: Secondary | ICD-10-CM | POA: Insufficient documentation

## 2017-07-23 DIAGNOSIS — J90 Pleural effusion, not elsewhere classified: Secondary | ICD-10-CM | POA: Insufficient documentation

## 2017-07-23 DIAGNOSIS — Z79899 Other long term (current) drug therapy: Secondary | ICD-10-CM | POA: Insufficient documentation

## 2017-07-23 DIAGNOSIS — C50512 Malignant neoplasm of lower-outer quadrant of left female breast: Secondary | ICD-10-CM | POA: Insufficient documentation

## 2017-07-23 DIAGNOSIS — M85852 Other specified disorders of bone density and structure, left thigh: Secondary | ICD-10-CM

## 2017-07-23 DIAGNOSIS — C786 Secondary malignant neoplasm of retroperitoneum and peritoneum: Secondary | ICD-10-CM | POA: Insufficient documentation

## 2017-07-23 DIAGNOSIS — E785 Hyperlipidemia, unspecified: Secondary | ICD-10-CM | POA: Diagnosis not present

## 2017-07-23 DIAGNOSIS — G4730 Sleep apnea, unspecified: Secondary | ICD-10-CM | POA: Insufficient documentation

## 2017-07-23 DIAGNOSIS — N200 Calculus of kidney: Secondary | ICD-10-CM | POA: Diagnosis not present

## 2017-07-23 DIAGNOSIS — Z8041 Family history of malignant neoplasm of ovary: Secondary | ICD-10-CM | POA: Diagnosis not present

## 2017-07-23 DIAGNOSIS — Z7189 Other specified counseling: Secondary | ICD-10-CM

## 2017-07-23 LAB — COMPREHENSIVE METABOLIC PANEL
ALBUMIN: 3.6 g/dL (ref 3.5–5.0)
ALK PHOS: 48 U/L (ref 38–126)
ALT: 11 U/L — ABNORMAL LOW (ref 14–54)
ANION GAP: 7 (ref 5–15)
AST: 15 U/L (ref 15–41)
BUN: 12 mg/dL (ref 6–20)
CALCIUM: 10 mg/dL (ref 8.9–10.3)
CO2: 25 mmol/L (ref 22–32)
Chloride: 105 mmol/L (ref 101–111)
Creatinine, Ser: 0.7 mg/dL (ref 0.44–1.00)
GFR calc non Af Amer: >60 mL/min (ref >60)
GLUCOSE: 150 mg/dL — AB (ref 65–99)
POTASSIUM: 4.1 mmol/L (ref 3.5–5.1)
SODIUM: 137 mmol/L (ref 135–145)
TOTAL PROTEIN: 6.9 g/dL (ref 6.5–8.1)
Total Bilirubin: 0.5 mg/dL (ref 0.3–1.2)

## 2017-07-23 LAB — CBC WITH DIFFERENTIAL/PLATELET
BASOS PCT: 1 %
Basophils Absolute: 0.1 10*3/uL (ref 0–0.1)
EOS ABS: 0.2 10*3/uL (ref 0–0.7)
EOS PCT: 3 %
HCT: 35.9 % (ref 35.0–47.0)
Hemoglobin: 12.1 g/dL (ref 12.0–16.0)
LYMPHS ABS: 1.6 10*3/uL (ref 1.0–3.6)
Lymphocytes Relative: 23 %
MCH: 29.8 pg (ref 26.0–34.0)
MCHC: 33.6 g/dL (ref 32.0–36.0)
MCV: 88.8 fL (ref 80.0–100.0)
MONO ABS: 0.7 10*3/uL (ref 0.2–0.9)
MONOS PCT: 10 %
Neutro Abs: 4.5 10*3/uL (ref 1.4–6.5)
Neutrophils Relative %: 63 %
Platelets: 329 10*3/uL (ref 150–440)
RBC: 4.04 MIL/uL (ref 3.80–5.20)
RDW: 14.1 % (ref 11.5–14.5)
WBC: 7 10*3/uL (ref 3.6–11.0)

## 2017-07-23 MED ORDER — OXYCODONE HCL 5 MG PO TABS: 5.0000 mg | ORAL_TABLET | Freq: Four times a day (QID) | ORAL | 0 refills | Status: DC | PRN

## 2017-07-23 NOTE — Progress Notes (Signed)
Patient here for results. She reports level 10 pain in her upper abdomen that has been going on for about two weeks. She states that her abdomen is distended and she feels bloated. She has had regular bowel movements daily and her appetite is about 25% of what she normally eats.

## 2017-07-23 NOTE — Progress Notes (Signed)
Hematology/Oncology Follow Up Note [facility] [phone] [phone]   Patient Care Team: Rubye Beach as PCP - General (Family Medicine) Bary Castilla, Forest Gleason, MD (General Surgery) Earlie Server, MD as Consulting Physician (Oncology)  REASON FOR VISIT Follow up for treatment of breast cancer.  HISTORY OF PRESENTING ILLNESS/PERTINENT ONCOLOGY HISTORY Norma Johns is a 77 y.o.afemale who has above oncology history reviewed by me today presented for follow up visit for management of   evaluation and management of breast cancer.   Patient went for routine mammogram screening in May. Abnormalities was found on the left breast. Patient was called back to have diagnostic mammogram done on 08/08/2016 which showed left breast mass at 4:00 3 cm from nipple and a 4:00 6 cm of the nipple. Indeterminate left axillary lymph nodes.  Patient underwent biopsy on 08/15/2016. Pathology showed invasive lobular carcinoma for both breast lesion, . DCIS present, lymph node biopsy is positive for metastatic lobular carcinoma  MRI of the breast on 09/10/2016 showed the having small no mass area of progressive enhancement, left breast 2 biopsy proven malignancy seen in the left breast were separated by 3.4 cm band of weakly enhancing tissue. An area of stippled discontinuous no masses enhancement extends superiorly in the left breast in 2 the upper outer quadrant spanning approximately 3.2 cm anterior/superior to the more anterior of the 2 biopsy proven malignancy. Recommend MRI guided core biopsy of the treated area of non-mass enhancement. Patient was seen and evaluated by Dr. Tollie Pizza and position was left modified radical mastectomy, right simple mastectomy with sentinel node biopsy. Patient underwent surgery on 10/01/2016. SHe tolerated the procedure well.  She declined systemic chemotherapy or radiation therapy. She was started on Arimidex 46m daily since  September. Denies any hotflush or joint pain.  Her wound has healed well and she follows up with surgeon for seroma. She does not complaints about pain. She noticed a small area of fluid collection.   INTERVAL HISTORY Patient presents for follow-up of breast cancer and discussion of recent CT scan results.  She previously declined a CT scan at her previous visit. She recently switched [facility] care [facility].  She reports increasing abdominal girth and abdominal pain.  CT scan was ordered which unfortunately showed disease recurrence, including interval development of peritoneal carcinomatosis, ascites, scattered new small lung nodules, new bilateral small pleural effusions.  Nonobstructing left renal calculus.  Patient's appointment was moved to today for discussion about CT scan and disease recurrence.   ROS:  Review of Systems  Constitutional: Positive for fatigue. Negative for chills.  HENT:   Negative for hearing loss, lump/mass and nosebleeds.   Eyes: Negative for eye problems and icterus.  Respiratory: Positive for shortness of breath. Negative for chest tightness, cough and hemoptysis.   Cardiovascular: Negative for chest pain and leg swelling.  Gastrointestinal: Positive for abdominal distention and abdominal pain. Negative for constipation and diarrhea.  Endocrine: Negative for hot flashes.  Genitourinary: Negative for difficulty urinating, dyspareunia, frequency and hematuria.   Musculoskeletal: Negative for arthralgias, back pain and myalgias.       Chronic back pain  Skin: Negative for itching and rash.  Neurological: Negative for dizziness, headaches and light-headedness.  Hematological: Negative.  Negative for adenopathy. Does not bruise/bleed easily.  Psychiatric/Behavioral: The patient is not nervous/anxious.     MEDICAL HISTORY:  Past Medical History:  Diagnosis Date  . Anemia   . Anxiety   . Arthritis   .  Asthma    WELL CONTROLLED  . Breast  cancer ([place]) 2018   left breast cnacer  . Cancer ([place])    Bilat Mastectomy  . Depression   . GERD (gastroesophageal reflux disease)   . Headache    H/O  . Hyperlipidemia   . Sleep apnea    DOES NOT USE CPAP-COULD NOT TOLERATE  . Stroke [facility]) 2004    SURGICAL HISTORY: Past Surgical History:  Procedure Laterality Date  . ABDOMINAL HYSTERECTOMY    . BLADDER REPAIR    . BREAST BIOPSY Right 03/23/1991   Fibroadenoma with intramammary lymph node. Right breast, 9:00.  Marland Kitchen CHOLECYSTECTOMY    . COLONOSCOPY  2005  . MASTECTOMY MODIFIED RADICAL Left 10/01/2016   Procedure: MASTECTOMY MODIFIED RADICAL;  Surgeon: Robert Bellow, MD;  Location: [facility] ORS;  Service: General;  Laterality: Left;  . MR MRA CAROTID  02/2010   Minimal plaque formation; no significant stenosis. Sx: Syncope  . [facility]  . SENTINEL NODE BIOPSY Right 10/01/2016   Procedure: SENTINEL NODE BIOPSY;  Surgeon: Robert Bellow, MD;  Location: [facility] ORS;  Service: General;  Laterality: Right;  . SIMPLE MASTECTOMY WITH AXILLARY SENTINEL NODE BIOPSY Right 10/01/2016   Procedure: SIMPLE MASTECTOMY;  Surgeon: Robert Bellow, MD;  Location: [facility] ORS;  Service: General;  Laterality: Right;    SOCIAL HISTORY: Social History   Socioeconomic History  . Marital status: Married    Spouse name: Not on file  . Number of children: 2  . Years of education: Not on file  . Highest education level: 12th grade  Occupational History  . Occupation: retired  Scientific laboratory technician  . Financial resource strain: Very hard  . Food insecurity:    Worry: Never true    Inability: Never true  . Transportation needs:    Medical: No    Non-medical: No  Tobacco Use  . Smoking status: Never Smoker  . Smokeless tobacco: Never Used  Substance and Sexual Activity  . Alcohol use: No  . Drug use: No  . Sexual activity: Never  Lifestyle  . Physical activity:    Days per week: Not on file    Minutes per session: Not on  file  . Stress: Very much  Relationships  . Social connections:    Talks on phone: Not on file    Gets together: Not on file    Attends religious service: Not on file    Active member of club or organization: Not on file    Attends meetings of clubs or organizations: Not on file    Relationship status: Not on file  . Intimate partner violence:    Fear of current or ex partner: No    Emotionally abused: No    Physically abused: No    Forced sexual activity: No  Other Topics Concern  . Not on file  Social History Narrative  . Not on file    FAMILY HISTORY: Family History  Problem Relation Age of Onset  . Cancer Father   . Prostate cancer Father   . Heart attack Mother   . Hypertension Mother   . CAD Mother   . Hypertension Son   . Diabetes Son   . Lung cancer Brother   . Leukemia Brother   . Lung cancer Brother   . Prostate cancer Brother   . Breast cancer Neg Hx     ALLERGIES:  is allergic to chocolate; other; sulfa antibiotics;  atorvastatin; minocycline hcl; naproxen; niacin; septra [sulfamethoxazole-trimethoprim]; vioxx [rofecoxib]; and penicillins.  MEDICATIONS:  Current Outpatient Medications  Medication Sig Dispense Refill  . albuterol (PROVENTIL HFA;VENTOLIN HFA) 108 (90 Base) MCG/ACT inhaler Inhale 2 puffs into the lungs every 4 (four) hours as needed for wheezing or shortness of breath. 1 Inhaler 11  . ALPRAZolam (XANAX) 0.5 MG tablet Take 1 tablet (0.5 mg total) by mouth at bedtime as needed for anxiety. 30 tablet 5  . anastrozole (ARIMIDEX) 1 MG tablet TAKE (1) TABLET BY MOUTH EVERY DAY 30 tablet 3  . fexofenadine (ALLEGRA) 180 MG tablet Take 180 mg by mouth daily. PRN    . fluticasone (FLONASE) 50 MCG/ACT nasal spray Place 2 sprays into both nostrils daily. 16 g 5  . Multiple Vitamin (MULTIVITAMIN) capsule Take 1 capsule by mouth daily.    . pantoprazole (PROTONIX) 40 MG tablet Take 1 tablet (40 mg total) by mouth 2 (two) times daily before a meal. 180  tablet 1  . phenazopyridine (PYRIDIUM) 200 MG tablet Take 1 tablet (200 mg total) by mouth 3 (three) times daily as needed for pain. 20 tablet 0  . triamcinolone cream (KENALOG) 0.1 % Apply 1 application topically 2 (two) times daily. 30 g 0  . oxyCODONE (OXY IR/ROXICODONE) 5 MG immediate release tablet Take 1 tablet (5 mg total) by mouth every 6 (six) hours as needed for severe pain. 30 tablet 0  . vitamin B-12 (CYANOCOBALAMIN) 1000 MCG tablet Take 1,000 mcg by mouth daily.     No current facility-administered medications for this visit.       Marland Kitchen  PHYSICAL EXAMINATION: ECOG PERFORMANCE STATUS: 0 - Asymptomatic Vitals:   07/23/17 1114 07/23/17 1117  BP:  (!) 147/87  Pulse:  84  Resp: 20   Temp:  98.1 F (36.7 C)   Filed Weights   07/23/17 1114  Weight: 171 lb 14.4 oz (78 kg)   Physical Exam  Constitutional: She is oriented to person, place, and time and well-developed, well-nourished, and in no distress. No distress.  HENT:  Head: Normocephalic and atraumatic.  Nose: Nose normal.  Mouth/Throat: Oropharynx is clear and moist. No oropharyngeal exudate.  Eyes: Pupils are equal, round, and reactive to light. EOM are normal. Left eye exhibits no discharge. No scleral icterus.  Neck: Normal range of motion. Neck supple. No JVD present.  Cardiovascular: Normal rate, regular rhythm and normal heart sounds. Exam reveals no friction rub.  No murmur heard. Pulmonary/Chest: Effort normal and breath sounds normal. No respiratory distress. She has no wheezes. She has no rales. She exhibits no tenderness.  Abdominal: Soft. Bowel sounds are normal. She exhibits distension. She exhibits no mass. There is no guarding.  Musculoskeletal: Normal range of motion. She exhibits no edema or tenderness.  Lymphadenopathy:    She has no cervical adenopathy.  Neurological: She is alert and oriented to person, place, and time. No cranial nerve deficit. She exhibits normal muscle tone. Coordination normal.    Skin: Skin is warm and dry. No rash noted. She is not diaphoretic. No erythema.  Psychiatric: Memory, affect and judgment normal.  Breast exam is performed in seated and lying down position. Patient is status post bilateral mastectomy.  Scars are well healed,  focal area over the sternum questionable seroma. LABORATORY DATA:  I have reviewed the data as listed Lab Results  Component Value Date   WBC 7.0 07/23/2017   HGB 12.1 07/23/2017   HCT 35.9 07/23/2017   MCV 88.8 07/23/2017  PLT 329 07/23/2017   Recent Labs    03/11/17 1148 05/13/17 1300 07/17/17 1101 07/23/17 1207  NA 139 138  --  137  K 4.2 3.7  --  4.1  CL 108 107  --  105  CO2 27 25  --  25  GLUCOSE 127* 150*  --  150*  BUN 11 12  --  12  CREATININE 0.59 0.80 0.70 0.70  CALCIUM 9.2 9.8  --  10.0  GFRNONAA >60 >60  --  >60  GFRAA >60 >60  --  >60  PROT 6.9 6.7  --  6.9  ALBUMIN 3.8 3.7  --  3.6  AST 16 16  --  15  ALT 11* 11*  --  11*  ALKPHOS 40 44  --  48  BILITOT 0.8 0.5  --  0.5    Pathology 10/01/2016 SPECIMEN SUBMITTED:  A. Breast, left, and axillary contents  B. Breast, right; simple mastectomy  C. Sentinel node 1, right  DIAGNOSIS:  A. LEFT BREAST AND AXILLARY CONTENTS; MODIFIED RADICAL MASTECTOMY:  - INVASIVE CARCINOMA WITH DUCTAL AND LOBULAR FEATURES.  - LARGEST FOCUS OF INVASIVE CARCINOMA MEASURES 10 MM.  - NINETEEN OF NINETEEN LYMPH NODES POSITIVE FOR METASTASIS (19/19).  - BIOPSY SITE CHANGES, MARKER CLIP PRESENT.  - SURGICAL MARGINS ARE NEGATIVE.  B. RIGHT BREAST; SIMPLE MASTECTOMY:  - FIBROCYSTIC CHANGE.  - NEGATIVE FOR ATYPIA AND MALIGNANCY.  C. SENTINEL LYMPH NODE 1, RIGHT; EXCISION:  - NO TUMOR SEEN IN ONE LYMPH NODE (0/1).  Surgical Pathology Cancer Case Summary   INVASIVE CARCINOMA OF THEBREAST  Procedure: modified radical mastectomy  Specimen Laterality: left  Histologic Type: invasive carcinoma with ductal and lobular features  Histologic Grade ([place] Histologic  Score)       Glandular (Acinar)/Tubular Differentiation: 3       Nuclear Pleomorphism: 1       Mitotic Rate: 1       Overall Grade: 1  Tumor Size: 10 mm  Ductal Carcinoma in Situ (DCIS): present       Nuclear Grade: 1       Extensive intraductal component: negative  Lymphovascular Invasion: present  Treatment Effect: not applicable  Margins:       Invasive carcinoma: margins negative       Distance from closest margin: 20 mm from deep margin       DCIS: margins negative         Distance from closest margin: 20 mm from deep margin  Regional Lymph nodes:    Total # lymph nodes examined: 19    # Sentinel lymph nodes examined: 0    # Lymph nodes with macrometastasis (>2.0 mm): 18    # Lymph nodes with isolated tumor cells (<0.24m): 0    # Lymph nodes with micrometastasis (> 0.2 mm and < 2.0 mm): 1    Extranodal extension: focally present  Pathologic Stage Classification (pTNM, AJCC 7th Edition)    TNM Descriptors: m (multiple foci of invasive carcinoma)    pTNM: mpT1b pN3a   Note: Two masses are identified. The 3rd grossly described nodule in the  upper outer quadrant is an intramammary node nearly replaced by tumor.  Biomarker testing was performed on a separate specimen and in summary:  Estrogen Receptor (ER) Status: POSITIVE, >90%    Average intensity of staining: Strong  Progesterone Receptor (PgR) Status: POSITIVE, >90%    Average intensity of staining: Moderate  HER2 (by immunohistochemistry): NEGATIVE (Score 1+)  Pathology 10/01/2016 SPECIMEN SUBMITTED:  A. Breast, left, 4:00, 3 CMFN, biopsy  B. Breast, left, 4:00, 6 CMFN, biopsy  C. Lymph node, left axilla, biopsy  DIAGNOSIS:  A. BREAST, LEFT, 4 O'CLOCK 3 CM FROM NIPPLE; ULTRASOUND-GUIDED CORE  BIOPSY:  - INVASIVE LOBULAR CARCINOMA, CLASSIC TYPE.  Size of invasive carcinoma: 6 mm in this sample  Histologic grade of invasive  carcinoma: Grade 1    Glandular/tubular differentiation score: 3    Nuclear pleomorphism score: 1    Mitotic rate score: 1    Total score: 5  Ductal carcinomain situ: Present, low grade  Lymphovascular invasion: Not identified  B. BREAST, LEFT, 4 O'CLOCK 6 CM FROM NIPPLE; ULTRASOUND-GUIDED CORE  BIOPSY:  - INVASIVE LOBULAR CARCINOMA, CLASSIC TYPE.  Size of invasive carcinoma: 6 mm in this sample  Histologic grade of invasive carcinoma: Grade 1    Glandular/tubular differentiation score: 3    Nuclear pleomorphism score: 1    Mitotic rate score: 1    Total score: 5  Ductal carcinoma in situ: Not identified  Lymphovascular invasion: Not identified  C. LYMPH NODE, LEFT AXILLA; ULTRASOUND-GUIDED CORE BIOPSY:  - METASTATIC LOBULAR CARCINOMA; TUMOR SPANS 7 MM.  - NO EXTRANODAL EXTENSION IN THIS SAMPLE.   ASSESSMENT & PLAN:  Cancer Staging Malignant neoplasm of lower-outer quadrant of left breast of female, estrogen receptor positive ([place]) Staging form: Breast, AJCC 8th Edition - Clinical stage from 10/22/2016: Stage IIIB (cT1b(m), cN3a, cM0, G1, ER: Positive, PR: Positive, HER2: Negative) - Signed by Earlie Server, MD on 10/22/2016 - Pathologic stage from 10/22/2016: pT1b(m), pN3a, cM0, ER: Positive, PR: Positive, HER2: Negative - Signed by Earlie Server, MD on 10/22/2016  1. Osteopenia of neck of left femur   2. Malignant neoplasm of lower-outer quadrant of left breast of female, estrogen receptor positive ([place])   3. Goals of care, counseling/discussion   4. Malignant ascites   5. Metastatic breast cancer ([place])     77 yo female with stage III B, ER/PR positive HER-2 negative invasive carcinoma with both ductal and lobular features. She has 19 out of 19 axillary lymph nodes positive on the left. S/p lumpectomy and declined both chemotherapy and radiation, persistent elevated CA 27.29, previously declined adjuvant chemotherapy and CT scan surveillance, presents for discussion  about recent CT scan which indicates disease recurrence.  She is accompanied by her daughter today.  CT scan was independently reviewed by me and discussed in details with patient and her daughter. Concerning for metastatic breast cancer with peritoneal carcinomatosis and likely lung nodules. Discussed with patient that she will need diagnostic and therapeutic ultrasound-guided paracentesis.  We will send cytology to confirm disease recurrence.  She will also get therapeutic relief for abdominal distention and discomfort. Obtain bone scan.   Discussed with patient that most likely breast cancer has recurred and her options are starting with chemotherapy agents given that she has developed peritoneal carcinomatosis and ascites.  Patient declined.  Discussed about other options of starting CDK4/5 agent.  Patient is interested. May also add Avastin in the future Goal of care was discussed with patient.  If it is confirmed that she has metastatic breast cancer, her disease is not curable.  And goal of care is with palliative intent.  Patient and her daughter voiced understanding...  Plan Starting Abemaciclib + Fulvestrant.  All questions were answered. The patient knows to call the clinic with any problems questions or concerns.  Return of visit: 1 week.     Earlie Server, MD,  PhD Hematology Oncology [facility] at [facility] Pager- [phone] 07/23/2017

## 2017-07-24 LAB — CANCER ANTIGEN 27.29: CAN 27.29: 52.8 U/mL — AB (ref 0.0–38.6)

## 2017-07-25 ENCOUNTER — Ambulatory Visit
Admission: RE | Admit: 2017-07-25 | Discharge: 2017-07-25 | Disposition: A | Discharge status: Home / Self Care | Payer: Medicare Other | Source: Ambulatory Visit | Attending: Oncology | Admitting: Oncology

## 2017-07-25 ENCOUNTER — Encounter: Payer: Self-pay | Admitting: Oncology

## 2017-07-25 ENCOUNTER — Telehealth: Payer: Self-pay | Admitting: Pharmacist

## 2017-07-25 ENCOUNTER — Other Ambulatory Visit: Payer: Self-pay | Admitting: Oncology

## 2017-07-25 DIAGNOSIS — C50512 Malignant neoplasm of lower-outer quadrant of left female breast: Secondary | ICD-10-CM | POA: Insufficient documentation

## 2017-07-25 DIAGNOSIS — C50919 Malignant neoplasm of unspecified site of unspecified female breast: Secondary | ICD-10-CM

## 2017-07-25 DIAGNOSIS — R180 Malignant ascites: Secondary | ICD-10-CM | POA: Diagnosis not present

## 2017-07-25 DIAGNOSIS — Z170 Estrogen receptor positive status [ER+]: Secondary | ICD-10-CM | POA: Insufficient documentation

## 2017-07-25 DIAGNOSIS — R188 Other ascites: Secondary | ICD-10-CM | POA: Diagnosis not present

## 2017-07-25 HISTORY — DX: Malignant neoplasm of unspecified site of unspecified female breast: C50.919

## 2017-07-25 NOTE — Telephone Encounter (Addendum)
Oral Oncology Pharmacist Encounter  Per Dr. Tasia Catchings this patient may need Verzenio (abemaciclib) for the treatment of metastatic HR+/HER2- breast cancer in conjunction with faslodex, planned duration until disease progression or unacceptable drug toxicity.  CBC/CMP from 07/23/17 assessed, no relevant lab abnormalities. Prescription dose and frequency assessed.   Current medication list in Epic reviewed, no DDIs with Verzenio identified.  Oral Oncology Clinic will continue to follow for insurance authorization, copayment issues, initial counseling and start date.  Darl Pikes, PharmD, BCPS Hematology/Oncology Clinical Pharmacist [facility] [phone]  07/25/2017 3:00 PM

## 2017-07-25 NOTE — Progress Notes (Signed)
START ON PATHWAY REGIMEN - Breast     A cycle is every 28 days:     Abemaciclib      Fulvestrant      Fulvestrant   **Always confirm dose/schedule in your pharmacy ordering system**    Patient Characteristics: Distant Metastases or Locoregional Recurrent Disease - Unresected, HER2 Negative/Unknown/Equivocal, ER Positive, Endocrine Therapy, Postmenopausal, First Line, Progression on Current Adjuvant  Endocrine Therapy Therapeutic Status: Distant Metastases BRCA Mutation Status: Did Not Order Test ER Status: Positive (+) HER2 Status: Negative (-) PR Status: Positive (+) Menopausal Status: Postmenopausal Line of Therapy: First Line Previous Therapy: Progression on Current Adjuvant Endocrine Therapy Intent of Therapy: Non-Curative / Palliative Intent, Discussed with Patient

## 2017-07-25 NOTE — Telephone Encounter (Signed)
Oral Oncology Pharmacist Encounter   Prior Authorization for Norma Johns has been approved.     PA# [id number] Effective dates: 07/25/17 through 02/25/18   Oral Oncology Clinic will continue to follow.   I will place a copy of the approval letter to be scanned into patient's chart.  Darl Pikes, PharmD, BCPS Hematology/Oncology Clinical Pharmacist [facility] [place] [facility] [phone]  07/25/2017 4:34 PM

## 2017-07-25 NOTE — Procedures (Signed)
Pre Procedural Dx: Peritoneal Carcinomatosis Post Procedural Dx: Same  Trace amount of intraabdominal ascites. No paracentesis attempted.   Ronny Bacon, MD Pager #: [phone]

## 2017-07-25 NOTE — Telephone Encounter (Signed)
Oral Oncology Pharmacist Encounter  Received notification from [employer] that prior authorization for Verzenio (abemaciclib) is required.  PA submitted on CoverMyMeds Key [id number] Status is pending  Oral Oncology Clinic will continue to follow.  Darl Pikes, PharmD, BCPS Hematology/Oncology Clinical Pharmacist [facility] [place] [facility] [phone]  07/25/2017 4:23 PM

## 2017-07-26 ENCOUNTER — Other Ambulatory Visit: Payer: Self-pay | Admitting: Oncology

## 2017-07-26 ENCOUNTER — Ambulatory Visit: Payer: Medicare Other | Admitting: Oncology

## 2017-07-26 DIAGNOSIS — Z170 Estrogen receptor positive status [ER+]: Principal | ICD-10-CM

## 2017-07-26 DIAGNOSIS — C50512 Malignant neoplasm of lower-outer quadrant of left female breast: Secondary | ICD-10-CM

## 2017-07-26 DIAGNOSIS — C762 Malignant neoplasm of abdomen: Secondary | ICD-10-CM

## 2017-07-29 ENCOUNTER — Ambulatory Visit: Payer: Medicare Other | Admitting: Oncology

## 2017-07-29 ENCOUNTER — Other Ambulatory Visit: Payer: Medicare Other

## 2017-07-30 ENCOUNTER — Emergency Department: Payer: Medicare Other

## 2017-07-30 ENCOUNTER — Ambulatory Visit
Admission: RE | Admit: 2017-07-30 | Discharge: 2017-07-30 | Disposition: A | Discharge status: Home / Self Care | Payer: Medicare Other | Source: Ambulatory Visit | Attending: Oncology | Admitting: Oncology

## 2017-07-30 ENCOUNTER — Other Ambulatory Visit: Payer: Self-pay

## 2017-07-30 ENCOUNTER — Encounter
Admission: RE | Admit: 2017-07-30 | Discharge: 2017-07-30 | Disposition: A | Discharge status: Home / Self Care | Payer: Medicare Other | Source: Ambulatory Visit | Attending: Oncology | Admitting: Oncology

## 2017-07-30 ENCOUNTER — Encounter: Payer: Self-pay | Admitting: Emergency Medicine

## 2017-07-30 ENCOUNTER — Emergency Department
Admission: EM | Admit: 2017-07-30 | Discharge: 2017-07-30 | Disposition: A | Discharge status: Home / Self Care | Payer: Medicare Other | Attending: Emergency Medicine | Admitting: Emergency Medicine

## 2017-07-30 DIAGNOSIS — E119 Type 2 diabetes mellitus without complications: Secondary | ICD-10-CM | POA: Diagnosis not present

## 2017-07-30 DIAGNOSIS — C50919 Malignant neoplasm of unspecified site of unspecified female breast: Secondary | ICD-10-CM | POA: Diagnosis not present

## 2017-07-30 DIAGNOSIS — R0602 Shortness of breath: Secondary | ICD-10-CM | POA: Insufficient documentation

## 2017-07-30 DIAGNOSIS — I10 Essential (primary) hypertension: Secondary | ICD-10-CM | POA: Insufficient documentation

## 2017-07-30 DIAGNOSIS — C50512 Malignant neoplasm of lower-outer quadrant of left female breast: Secondary | ICD-10-CM

## 2017-07-30 DIAGNOSIS — Z170 Estrogen receptor positive status [ER+]: Principal | ICD-10-CM

## 2017-07-30 DIAGNOSIS — R071 Chest pain on breathing: Secondary | ICD-10-CM | POA: Diagnosis not present

## 2017-07-30 DIAGNOSIS — R1012 Left upper quadrant pain: Secondary | ICD-10-CM | POA: Diagnosis not present

## 2017-07-30 DIAGNOSIS — R1010 Upper abdominal pain, unspecified: Secondary | ICD-10-CM | POA: Insufficient documentation

## 2017-07-30 DIAGNOSIS — R188 Other ascites: Secondary | ICD-10-CM | POA: Diagnosis not present

## 2017-07-30 DIAGNOSIS — J45909 Unspecified asthma, uncomplicated: Secondary | ICD-10-CM | POA: Insufficient documentation

## 2017-07-30 DIAGNOSIS — R180 Malignant ascites: Secondary | ICD-10-CM

## 2017-07-30 DIAGNOSIS — Z79899 Other long term (current) drug therapy: Secondary | ICD-10-CM | POA: Diagnosis not present

## 2017-07-30 LAB — URINALYSIS, COMPLETE (UACMP) WITH MICROSCOPIC
Bacteria, UA: NONE SEEN
Bilirubin Urine: NEGATIVE
Glucose, UA: NEGATIVE mg/dL
KETONES UR: NEGATIVE mg/dL
LEUKOCYTES UA: NEGATIVE
Nitrite: NEGATIVE
PH: 7 (ref 5.0–8.0)
PROTEIN: NEGATIVE mg/dL
Specific Gravity, Urine: 1.004 — ABNORMAL LOW (ref 1.005–1.030)

## 2017-07-30 LAB — COMPREHENSIVE METABOLIC PANEL
ALK PHOS: 49 U/L (ref 38–126)
ALT: 13 U/L — AB (ref 14–54)
AST: 20 U/L (ref 15–41)
Albumin: 3.6 g/dL (ref 3.5–5.0)
Anion gap: 8 (ref 5–15)
BUN: 10 mg/dL (ref 6–20)
CALCIUM: 10 mg/dL (ref 8.9–10.3)
CHLORIDE: 104 mmol/L (ref 101–111)
CO2: 24 mmol/L (ref 22–32)
CREATININE: 0.67 mg/dL (ref 0.44–1.00)
Glucose, Bld: 134 mg/dL — ABNORMAL HIGH (ref 65–99)
Potassium: 3.5 mmol/L (ref 3.5–5.1)
Sodium: 136 mmol/L (ref 135–145)
TOTAL PROTEIN: 7 g/dL (ref 6.5–8.1)
Total Bilirubin: 0.5 mg/dL (ref 0.3–1.2)

## 2017-07-30 LAB — CBC
HCT: 36.4 % (ref 35.0–47.0)
Hemoglobin: 12.3 g/dL (ref 12.0–16.0)
MCH: 30.6 pg (ref 26.0–34.0)
MCHC: 33.9 g/dL (ref 32.0–36.0)
MCV: 90.2 fL (ref 80.0–100.0)
PLATELETS: 372 10*3/uL (ref 150–440)
RBC: 4.03 MIL/uL (ref 3.80–5.20)
RDW: 13.8 % (ref 11.5–14.5)
WBC: 9.6 10*3/uL (ref 3.6–11.0)

## 2017-07-30 LAB — FIBRIN DERIVATIVES D-DIMER (ARMC ONLY): Fibrin derivatives D-dimer (ARMC): 1690.27 ng/mL (FEU) — ABNORMAL HIGH (ref 0.00–499.00)

## 2017-07-30 LAB — LIPASE, BLOOD: LIPASE: 28 U/L (ref 11–51)

## 2017-07-30 LAB — TROPONIN I: Troponin I: <0.03 ng/mL (ref <0.03)

## 2017-07-30 MED ORDER — ONDANSETRON HCL 4 MG/2ML IJ SOLN
4.0000 mg | Freq: Once | INTRAMUSCULAR | Status: AC
  Administered 2017-07-30: 4 mg via INTRAVENOUS
  Filled 2017-07-30: qty 2

## 2017-07-30 MED ORDER — MORPHINE SULFATE (PF) 4 MG/ML IV SOLN
4.0000 mg | Freq: Once | INTRAVENOUS | Status: AC
  Administered 2017-07-30: 4 mg via INTRAVENOUS
  Filled 2017-07-30: qty 1

## 2017-07-30 MED ORDER — OXYCODONE-ACETAMINOPHEN 10-325 MG PO TABS: 1.0000 | ORAL_TABLET | Freq: Four times a day (QID) | ORAL | 0 refills | Status: DC | PRN

## 2017-07-30 MED ORDER — HYDROMORPHONE HCL 1 MG/ML IJ SOLN
0.5000 mg | Freq: Once | INTRAMUSCULAR | Status: AC
  Administered 2017-07-30: 0.5 mg via INTRAVENOUS
  Filled 2017-07-30: qty 1

## 2017-07-30 MED ORDER — IOPAMIDOL (ISOVUE-370) INJECTION 76%
75.0000 mL | Freq: Once | INTRAVENOUS | Status: AC | PRN
  Administered 2017-07-30: 75 mL via INTRAVENOUS

## 2017-07-30 MED ORDER — TECHNETIUM TC 99M MEDRONATE IV KIT
21.4300 | PACK | Freq: Once | INTRAVENOUS | Status: AC | PRN
  Administered 2017-07-30: 21.43 via INTRAVENOUS

## 2017-07-30 NOTE — ED Notes (Signed)
Pt also c/o shortness of breath and is tachypneic in triage.  Pt has mask applied.

## 2017-07-30 NOTE — ED Notes (Signed)
Pt reports she near syncope and abdominal pain s/p PET scan today once she got home. Pt reports LUQ abdominal pain with 1 episode of vomiting. Hx beast CA with double mastectomy last year. Pt reports she may have mets to stomach. Pt refuses an IV at this time. CAOx4. 10/10 sharp abdominal pain. Denies CP/BOB. Pt reports constipation over last couple days. Pt reports some pressure while urinating. Pt denies fever/chills. Pt family remains at bedside. Call bell within reach.

## 2017-07-30 NOTE — ED Notes (Addendum)
Pt states "I just want to go home now, I know the pain is from my cancer. I just want some pain medications and to go home." MD [facility] aware. VSS. Call bell within reach, will continue to monitor.

## 2017-07-30 NOTE — ED Provider Notes (Signed)
[facility] Emergency Department Provider Note   ____________________________________________   First MD Initiated Contact with Patient 07/30/17 1605     (approximate)  I have reviewed the triage vital signs and the nursing notes.   HISTORY  Chief Complaint Near Syncope; Abdominal Pain; and Shortness of Breath   HPI Norma Johns is a 77 y.o. female Who has cancer with metastases. She was having a PET scan and had severe left upper quadrant left lower chest pain which made her almost passed out. She says that she has worse pain with deep breathing. She's also had some constipation for the last 4 days. On arrival here in the ER her respiratory rate was quite fast. She is not coughing up anything although she does have a dry cough.she is complaining of severe pain in the area mentioned.   Past Medical History:  Diagnosis Date  . Anemia   . Anxiety   . Arthritis   . Asthma    WELL CONTROLLED  . Breast cancer ([place]) 2018   left breast cnacer  . Cancer ([place])    Bilat Mastectomy  . Depression   . GERD (gastroesophageal reflux disease)   . Headache    H/O  . Hyperlipidemia   . Metastatic breast cancer ([place]) 07/25/2017  . Metastatic breast cancer ([place]) 07/25/2017  . Sleep apnea    DOES NOT USE CPAP-COULD NOT TOLERATE  . Stroke [facility]) 2004    Patient Active Problem List   Diagnosis Date Noted  . Metastatic breast cancer ([place]) 07/25/2017  . Goals of care, counseling/discussion 07/22/2017  . MRSA (methicillin resistant staph aureus) culture positive 04/05/2017  . Lymphedema 02/27/2017  . Shoulder weakness 02/27/2017  . Breast cancer ([place]) 10/01/2016  . Invasive lobular carcinoma of breast, stage 2, left ([place]) 09/18/2016  . Malignant neoplasm of lower-outer quadrant of left breast of female, estrogen receptor positive ([place]) 08/25/2016  . History of CVA with residual deficit 04/17/2016  . Low back pain 06/13/2015  . Anxiety 08/24/2014  . Atrophic  vaginitis 08/24/2014  . Body mass index (BMI) of 29.0-29.9 in adult 08/24/2014  . Cyst of nasal sinus 08/24/2014  . Degenerative joint disease 08/24/2014  . OP (osteoporosis) 08/24/2014  . Bulging eyes 08/24/2014  . GERD (gastroesophageal reflux disease) 08/16/2014  . History of colon polyps 09/21/2008  . Chronic recurrent sinusitis 09/17/2008  . Diabetes mellitus, type 2 ([place]) 08/03/2008  . Hypercholesteremia 06/22/2008  . Cannot sleep 03/23/2008  . Allergic rhinitis 01/08/2008  . Airway hyperreactivity 01/08/2008  . Carotid artery obstruction 01/08/2008  . Clinical depression 01/08/2008  . Benign hypertension 01/08/2008    Past Surgical History:  Procedure Laterality Date  . ABDOMINAL HYSTERECTOMY    . BLADDER REPAIR    . BREAST BIOPSY Right 03/23/1991   Fibroadenoma with intramammary lymph node. Right breast, 9:00.  Marland Kitchen CHOLECYSTECTOMY    . COLONOSCOPY  2005  . MASTECTOMY MODIFIED RADICAL Left 10/01/2016   Procedure: MASTECTOMY MODIFIED RADICAL;  Surgeon: Robert Bellow, MD;  Location: [facility] ORS;  Service: General;  Laterality: Left;  . MR MRA CAROTID  02/2010   Minimal plaque formation; no significant stenosis. Sx: Syncope  . [place] [facility]  . SENTINEL NODE BIOPSY Right 10/01/2016   Procedure: SENTINEL NODE BIOPSY;  Surgeon: Robert Bellow, MD;  Location: [facility] ORS;  Service: General;  Laterality: Right;  . SIMPLE MASTECTOMY WITH AXILLARY SENTINEL NODE BIOPSY Right 10/01/2016   Procedure: SIMPLE MASTECTOMY;  Surgeon: Robert Bellow, MD;  Location: [facility] ORS;  Service: General;  Laterality: Right;    Prior to Admission medications   Medication Sig Start Date End Date Taking? Authorizing Provider  ALPRAZolam Duanne Moron) 0.5 MG tablet Take 1 tablet (0.5 mg total) by mouth at bedtime as needed for anxiety. 03/21/17  Yes Mar Daring, PA-C  anastrozole (ARIMIDEX) 1 MG tablet TAKE (1) TABLET BY MOUTH EVERY DAY 01/31/17  Yes Earlie Server, MD  fluticasone  [facility]) 50 MCG/ACT nasal spray Place 2 sprays into both nostrils daily. 06/05/17  Yes Mar Daring, PA-C  Multiple Vitamin (MULTIVITAMIN) capsule Take 1 capsule by mouth daily.   Yes [provider]  pantoprazole (PROTONIX) 40 MG tablet Take 1 tablet (40 mg total) by mouth 2 (two) times daily before a meal. 07/03/17  Yes Burnette, Clearnce Sorrel, PA-C  vitamin B-12 (CYANOCOBALAMIN) 500 MCG tablet Take 500 mcg by mouth daily.    Yes [provider]  albuterol (PROVENTIL HFA;VENTOLIN HFA) 108 (90 Base) MCG/ACT inhaler Inhale 2 puffs into the lungs every 4 (four) hours as needed for wheezing or shortness of breath. 06/05/17   Mar Daring, PA-C  fexofenadine (ALLEGRA) 180 MG tablet Take 180 mg by mouth daily. PRN    [provider]  oxyCODONE (OXY IR/ROXICODONE) 5 MG immediate release tablet Take 1 tablet (5 mg total) by mouth every 6 (six) hours as needed for severe pain. 07/23/17   Earlie Server, MD  oxyCODONE-acetaminophen (PERCOCET) 10-325 MG tablet Take 1 tablet by mouth every 6 (six) hours as needed for pain. 07/30/17 07/30/18  Nena Polio, MD  phenazopyridine (PYRIDIUM) 200 MG tablet Take 1 tablet (200 mg total) by mouth 3 (three) times daily as needed for pain. Patient not taking: Reported on 07/30/2017 07/10/17   Mar Daring, PA-C  triamcinolone cream (KENALOG) 0.1 % Apply 1 application topically 2 (two) times daily. 06/11/17   Mar Daring, PA-C    Allergies Chocolate; Other; Sulfa antibiotics; Atorvastatin; Minocycline hcl; Naproxen; Niacin; Septra [sulfamethoxazole-trimethoprim]; Vioxx [rofecoxib]; and Penicillins  Family History  Problem Relation Age of Onset  . Cancer Father   . Prostate cancer Father   . Heart attack Mother   . Hypertension Mother   . CAD Mother   . Hypertension Son   . Diabetes Son   . Lung cancer Brother   . Leukemia Brother   . Lung cancer Brother   . Prostate cancer Brother   . Breast cancer Neg Hx     Social  History Social History   Tobacco Use  . Smoking status: Never Smoker  . Smokeless tobacco: Never Used  Substance Use Topics  . Alcohol use: No  . Drug use: No    Review of Systems  Constitutional: No fever/chills Eyes: No visual changes. ENT: No sore throat. Cardiovascular: see history of present illness Respiratory:  shortness of breath. Gastrointestinal:see history of present illness Genitourinary: Negative for dysuria. Musculoskeletal: Negative for back pain. Skin: Negative for rash. Neurological: Negative for headaches, focal weakness   ____________________________________________   PHYSICAL EXAM:  VITAL SIGNS: ED Triage Vitals [07/30/17 1521]  Enc Vitals Group     BP 124/68     Pulse Rate 72     Resp (!) 40     Temp (!) 97.5 F (36.4 C)     Temp Source Oral     SpO2 100 %     Weight 171 lb (77.6 kg)     Height 5\' 3"  (1.6  m)     Head Circumference      Peak Flow      Pain Score 10     Pain Loc      Pain Edu?      Excl. in [place]?     Constitutional: Alert and oriented. in pain Eyes: Conjunctivae are normal.  Head: Atraumatic. Nose: No congestion/rhinnorhea. Mouth/Throat: Mucous membranes are moist.  Oropharynx non-erythematous. Neck: No stridor.  Cardiovascular: Normal rate, regular rhythm. Grossly normal heart sounds.  Good peripheral circulation. Respiratory: Normal respiratory effort.  No retractions. Lungs CTAB. Gastrointestinal: Soft tender toPalpation in the left upper quadrantNo distention. No abdominal bruits. No CVA tenderness. Musculoskeletal: No lower extremity tenderness nor edema.   Neurologic:  Normal speech and language. No gross focal neurologic deficits are appreciated.  Skin:  Skin is warm, dry and intact. No rash noted. Psychiatric: Mood and affect are normal. Speech and behavior are normal.  ____________________________________________   LABS (all labs ordered are listed, but only abnormal results are displayed)  Labs Reviewed    COMPREHENSIVE METABOLIC PANEL - Abnormal; Notable for the following components:      Result Value   Glucose, Bld 134 (*)    ALT 13 (*)    All other components within normal limits  URINALYSIS, COMPLETE (UACMP) WITH MICROSCOPIC - Abnormal; Notable for the following components:   Color, Urine STRAW (*)    APPearance CLEAR (*)    Specific Gravity, Urine 1.004 (*)    Hgb urine dipstick SMALL (*)    All other components within normal limits  FIBRIN DERIVATIVES D-DIMER ([facility] ONLY) - Abnormal; Notable for the following components:   Fibrin derivatives D-dimer [facility]) 2,458.09 (*)    All other components within normal limits  LIPASE, BLOOD  CBC  TROPONIN I   ____________________________________________  EKG  EKG read and interpreted by me shows normal sinus rhythm rate of 70 normal axis no acute ST-T wave changes ____________________________________________  RADIOLOGY  ED MD interpretation: PET scan comes back now shows no evidence skeletal metastasis chest x-ray read by radiology reviewed by me shows small left pleural effusion with adjacent left lower lobe atelectasis that is unchanged.  Official radiology report(s): Dg Chest 2 View  Result Date: 07/30/2017 CLINICAL DATA:  Shortness of breath and near syncope. History of breast cancer. EXAM: CHEST - 2 VIEW COMPARISON:  CT chest dated Jul 17, 2017. Chest x-ray dated December 31, 2012. FINDINGS: The heart size and mediastinal contours are within normal limits. Mild interstitial prominence bilaterally. Small left pleural effusion with left lower lobe atelectasis. No pneumothorax. No acute osseous abnormality. IMPRESSION: 1. Unchanged small left pleural effusion with adjacent left lower lobe atelectasis. Electronically Signed   By: Titus Dubin M.D.   On: 07/30/2017 16:06   Dg Abdomen 1 View  Result Date: 07/30/2017 CLINICAL DATA:  77 year old female with near syncopal episode abdominal pain post chest CT today. EXAM: ABDOMEN - 1 VIEW  COMPARISON:  07/30/2017 chest CT.  07/17/2017 CT abdomen and pelvis. FINDINGS: Residual contrast in renal collecting system/bladder. Gas-filled top-normal size colon splenic flexure level. Nonspecific gas collection right upper quadrant. Findings similar to scout view of recent chest CT. CT detected ascites and peritoneal carcinomatosis not adequately assessed by plain film exam. IMPRESSION: Gas-filled top-normal size colon splenic flexure level. Nonspecific gas collection right upper quadrant. Findings similar to scout view of recent chest CT. CT detected ascites and peritoneal carcinomatosis not adequately assessed by plain film exam. Electronically Signed   By: Genia Del  M.D.   On: 07/30/2017 20:19   Ct Angio Chest Pe W And/or Wo Contrast  Result Date: 07/30/2017 CLINICAL DATA:  PE suspected, intermediate prob, positive D-dimer. Acute onset of shortness of breath earlier today when here for a bone scan. Abdominal pain. EXAM: CT ANGIOGRAPHY CHEST WITH CONTRAST TECHNIQUE: Multidetector CT imaging of the chest was performed using the standard protocol during bolus administration of intravenous contrast. Multiplanar CT image reconstructions and MIPs were obtained to evaluate the vascular anatomy. CONTRAST:  59mL ISOVUE-370 IOPAMIDOL (ISOVUE-370) INJECTION 76% COMPARISON:  Chest and abdominal CT 07/17/2017 FINDINGS: Cardiovascular: There are no filling defects within the pulmonary arteries to suggest pulmonary embolus. Tortuosity of the thoracic aorta without dissection. Common origin of the left common carotid and brachiocephalic artery, normal variant arch configuration. Upper normal heart size. No pericardial effusion. Mediastinum/Nodes: Increase size of right hilar lymph nodes from prior exam currently 10 and 11 mm short axis, previously 7 mm. Rounded fluid density structure adjacent to the lower esophagus likely represents loculated ascites rather than paraesophageal node. Possible 5 mm right internal  mammary node versus adjacent chondral cartilage. Small anterior right epicardial nodes are unchanged. No axillary adenopathy. Post bilateral mastectomy. Lungs/Pleura: Evaluation of pulmonary nodules seen on prior exam is suboptimal due to breathing motion artifact. Majority these nodules are small measuring approximately 4 mm, and unchanged. However a right lower lobe nodule image 49 series 6 has increased in size currently 7 mm, previously 4 mm. The diaphragmatic nodule in the left lower lobe on prior exam is currently obscured. Left pleural effusion is increased, now small to moderate. There is adjacent compressive atelectasis. Tiny right pleural effusion. Minimal patchy ground-glass opacities in the right lower and left upper lobes are likely atelectasis. Upper Abdomen: Increased upper abdominal ascites since prior abdominal CT, moderate in degree. Musculoskeletal: There are no acute or suspicious osseous abnormalities. Degenerative change in the spine. Vertebral body hemangioma in T3, not hypermetabolic and bone scan earlier today. Review of the MIP images confirms the above findings. IMPRESSION: 1. No pulmonary embolus. 2. Increased left pleural effusion development of small right pleural effusion since exam 2 weeks ago. Increased upper abdominal ascites. 3. Small pulmonary nodules, majority of which are unchanged or not as well evaluated, however definite increase size of a right lower lobe pulmonary nodule over the past 2 weeks suggesting progression of metastatic disease. 4. New borderline right hilar lymph nodes measuring up to 11 mm short axis, nonspecific in the setting for reactive versus metastatic. Electronically Signed   By: Jeb Levering M.D.   On: 07/30/2017 19:08   Nm Bone Scan Whole Body  Result Date: 07/30/2017 CLINICAL DATA:  Breast cancer.  Restaging.  Vomiting from pain. EXAM: NUCLEAR MEDICINE WHOLE BODY BONE SCAN TECHNIQUE: Whole body anterior and posterior images were obtained  approximately 3 hours after intravenous injection of radiopharmaceutical. RADIOPHARMACEUTICALS:  21.4 mCi Technetium-1m MDP IV COMPARISON:  Bone scan 10/30/2016, PET-CT scan 08/30/2016 FINDINGS: No focal uptake in the axillary or appendicular skeleton to localize skeletal metastasis. Diffuse uptake in the anterior calvarium is similar comparison exam in favored physiologic or metabolic. IMPRESSION: No scintigraphic evidence skeletal metastasis. Electronically Signed   By: Suzy Bouchard M.D.   On: 07/30/2017 16:58    ____________________________________________   PROCEDURES  Procedure(s) performed:   Procedures  Critical Care performed:   ____________________________________________   INITIAL IMPRESSION / ASSESSMENT AND PLAN / ED COURSE patient feeling better after the morphine her d-dimer is quite high will do a CT angiogram  to make sure there is no PE if that is negative plan on probably discharge her home for pain meds ----------------------------------------- 8:38 PM on 07/30/2017 -----------------------------------------  CT shows that he knows of. There is some air initially. The stomach bubble on chest x-ray now appears to possibly colon. Does not appear to be an obstruction. Patient says she feels better again she is not vomiting. I will let her go home with instructions to return if she is worse. Follow-up with her regular doctor.        ____________________________________________   FINAL CLINICAL IMPRESSION(S) / ED DIAGNOSES  Final diagnoses:  Pain of upper abdomen     ED Discharge Orders        Ordered    oxyCODONE-acetaminophen (PERCOCET) 10-325 MG tablet  Every 6 hours PRN     07/30/17 2037       Note:  This document was prepared using Dragon voice recognition software and may include unintentional dictation errors.    Nena Polio, MD 07/30/17 2038

## 2017-07-30 NOTE — Discharge Instructions (Addendum)
please take Percocet one pill 4 times a day as needed. If you need more you can take 1-1/2 pills 4 times a day. If you gets vomiting or fever with the pain is severe please return immediately.

## 2017-07-30 NOTE — ED Triage Notes (Signed)
Pt reports she was here earlier for PET Scan when she got short of breath and had near syncopal episode.  Pt states she went home after the PET scan and started having pain to left side of abdomen radiating to back.    Pt states the pain feels like a knife stabbing her and rates the pain 10/10.   Pt is currently being treated for cancer breast cancer that may be spreading to her stomach. Pt states she had mild pain in abdomen x 1.5 weeks and worse over the past 2 days.  Pt states she did have an episode of vomiting after the PET scan. Pt is alert and oriented x 4. Here with family

## 2017-07-31 ENCOUNTER — Other Ambulatory Visit: Payer: Self-pay

## 2017-07-31 ENCOUNTER — Encounter: Payer: Self-pay | Admitting: Oncology

## 2017-07-31 ENCOUNTER — Inpatient Hospital Stay: Payer: Medicare Other

## 2017-07-31 ENCOUNTER — Inpatient Hospital Stay: Payer: Medicare Other | Attending: Oncology

## 2017-07-31 ENCOUNTER — Inpatient Hospital Stay (HOSPITAL_BASED_OUTPATIENT_CLINIC_OR_DEPARTMENT_OTHER): Payer: Medicare Other | Admitting: Oncology

## 2017-07-31 VITALS — BP 127/79 | HR 75 | Temp 98.4°F | Resp 20 | Wt 171.2 lb

## 2017-07-31 DIAGNOSIS — E785 Hyperlipidemia, unspecified: Secondary | ICD-10-CM | POA: Diagnosis not present

## 2017-07-31 DIAGNOSIS — Z8042 Family history of malignant neoplasm of prostate: Secondary | ICD-10-CM

## 2017-07-31 DIAGNOSIS — Z79818 Long term (current) use of other agents affecting estrogen receptors and estrogen levels: Secondary | ICD-10-CM | POA: Diagnosis not present

## 2017-07-31 DIAGNOSIS — K219 Gastro-esophageal reflux disease without esophagitis: Secondary | ICD-10-CM | POA: Insufficient documentation

## 2017-07-31 DIAGNOSIS — F418 Other specified anxiety disorders: Secondary | ICD-10-CM | POA: Diagnosis not present

## 2017-07-31 DIAGNOSIS — E860 Dehydration: Secondary | ICD-10-CM | POA: Diagnosis not present

## 2017-07-31 DIAGNOSIS — C786 Secondary malignant neoplasm of retroperitoneum and peritoneum: Secondary | ICD-10-CM

## 2017-07-31 DIAGNOSIS — Z170 Estrogen receptor positive status [ER+]: Secondary | ICD-10-CM | POA: Insufficient documentation

## 2017-07-31 DIAGNOSIS — M85852 Other specified disorders of bone density and structure, left thigh: Secondary | ICD-10-CM

## 2017-07-31 DIAGNOSIS — C50512 Malignant neoplasm of lower-outer quadrant of left female breast: Secondary | ICD-10-CM | POA: Insufficient documentation

## 2017-07-31 DIAGNOSIS — R112 Nausea with vomiting, unspecified: Secondary | ICD-10-CM | POA: Diagnosis not present

## 2017-07-31 DIAGNOSIS — Z8673 Personal history of transient ischemic attack (TIA), and cerebral infarction without residual deficits: Secondary | ICD-10-CM

## 2017-07-31 DIAGNOSIS — G4730 Sleep apnea, unspecified: Secondary | ICD-10-CM | POA: Insufficient documentation

## 2017-07-31 DIAGNOSIS — R531 Weakness: Secondary | ICD-10-CM | POA: Insufficient documentation

## 2017-07-31 DIAGNOSIS — K5900 Constipation, unspecified: Secondary | ICD-10-CM | POA: Insufficient documentation

## 2017-07-31 DIAGNOSIS — Z79811 Long term (current) use of aromatase inhibitors: Secondary | ICD-10-CM

## 2017-07-31 DIAGNOSIS — C762 Malignant neoplasm of abdomen: Secondary | ICD-10-CM

## 2017-07-31 DIAGNOSIS — Z9012 Acquired absence of left breast and nipple: Secondary | ICD-10-CM | POA: Insufficient documentation

## 2017-07-31 DIAGNOSIS — K5903 Drug induced constipation: Secondary | ICD-10-CM

## 2017-07-31 DIAGNOSIS — G893 Neoplasm related pain (acute) (chronic): Secondary | ICD-10-CM

## 2017-07-31 DIAGNOSIS — C50919 Malignant neoplasm of unspecified site of unspecified female breast: Secondary | ICD-10-CM

## 2017-07-31 DIAGNOSIS — D0502 Lobular carcinoma in situ of left breast: Secondary | ICD-10-CM

## 2017-07-31 DIAGNOSIS — Z8639 Personal history of other endocrine, nutritional and metabolic disease: Secondary | ICD-10-CM

## 2017-07-31 LAB — COMPREHENSIVE METABOLIC PANEL
ALBUMIN: 3.5 g/dL (ref 3.5–5.0)
ALT: 44 U/L (ref 14–54)
AST: 49 U/L — ABNORMAL HIGH (ref 15–41)
Alkaline Phosphatase: 69 U/L (ref 38–126)
Anion gap: 7 (ref 5–15)
BUN: 12 mg/dL (ref 6–20)
CO2: 26 mmol/L (ref 22–32)
CREATININE: 0.77 mg/dL (ref 0.44–1.00)
Calcium: 10 mg/dL (ref 8.9–10.3)
Chloride: 103 mmol/L (ref 101–111)
GFR calc Af Amer: >60 mL/min (ref >60)
GFR calc non Af Amer: >60 mL/min (ref >60)
GLUCOSE: 140 mg/dL — AB (ref 65–99)
Potassium: 4.1 mmol/L (ref 3.5–5.1)
SODIUM: 136 mmol/L (ref 135–145)
Total Bilirubin: 0.8 mg/dL (ref 0.3–1.2)
Total Protein: 6.9 g/dL (ref 6.5–8.1)

## 2017-07-31 LAB — CBC WITH DIFFERENTIAL/PLATELET
BASOS PCT: 1 %
Basophils Absolute: 0 10*3/uL (ref 0–0.1)
EOS ABS: 0.2 10*3/uL (ref 0–0.7)
Eosinophils Relative: 2 %
HEMATOCRIT: 35.2 % (ref 35.0–47.0)
Hemoglobin: 11.8 g/dL — ABNORMAL LOW (ref 12.0–16.0)
LYMPHS ABS: 1.5 10*3/uL (ref 1.0–3.6)
Lymphocytes Relative: 16 %
MCH: 30 pg (ref 26.0–34.0)
MCHC: 33.5 g/dL (ref 32.0–36.0)
MCV: 89.6 fL (ref 80.0–100.0)
MONO ABS: 0.9 10*3/uL (ref 0.2–0.9)
MONOS PCT: 10 %
NEUTROS ABS: 6.6 10*3/uL — AB (ref 1.4–6.5)
Neutrophils Relative %: 71 %
Platelets: 379 10*3/uL (ref 150–440)
RBC: 3.93 MIL/uL (ref 3.80–5.20)
RDW: 13.6 % (ref 11.5–14.5)
WBC: 9.2 10*3/uL (ref 3.6–11.0)

## 2017-07-31 LAB — VITAMIN B12: Vitamin B-12: 586 pg/mL (ref 180–914)

## 2017-07-31 MED ORDER — OXYCODONE HCL 5 MG PO TABS: 5.0000 mg | ORAL_TABLET | ORAL | 0 refills | Status: DC | PRN

## 2017-07-31 MED ORDER — OXYCODONE HCL ER 10 MG PO T12A: 10.0000 mg | EXTENDED_RELEASE_TABLET | Freq: Two times a day (BID) | ORAL | 0 refills | Status: DC

## 2017-07-31 MED ORDER — SENNOSIDES-DOCUSATE SODIUM 8.6-50 MG PO TABS: 2.0000 | ORAL_TABLET | Freq: Every day | ORAL | 3 refills | Status: AC

## 2017-07-31 MED ORDER — FULVESTRANT 250 MG/5ML IM SOLN
500.0000 mg | Freq: Once | INTRAMUSCULAR | Status: AC
  Administered 2017-07-31: 500 mg via INTRAMUSCULAR
  Filled 2017-07-31: qty 10

## 2017-07-31 MED ORDER — OXYCODONE ER 9 MG PO C12A: 9.0000 mg | EXTENDED_RELEASE_CAPSULE | Freq: Two times a day (BID) | ORAL | 0 refills | Status: DC

## 2017-07-31 NOTE — Progress Notes (Signed)
Pt very uncomfortable with abdominal swelling, constipation and unable to eat due feeling full after a few bites of food.  Pt reports pain in abdomen radiating to left side and back.

## 2017-07-31 NOTE — Progress Notes (Signed)
Hematology/Oncology Follow Up Note  Medical Center Telephone:([phone] [phone]   Patient Care Team: Rubye Beach as PCP - General (Family Medicine) Bary Castilla, Forest Gleason, MD (General Surgery) Earlie Server, MD as Consulting Physician (Oncology)  REASON FOR VISIT Follow up for treatment of breast cancer.  HISTORY OF PRESENTING ILLNESS/PERTINENT ONCOLOGY HISTORY Norma Johns is a 77 y.o.afemale who has above oncology history reviewed by me today presented for follow up visit for management of   evaluation and management of breast cancer.   Patient went for routine mammogram screening in May. Abnormalities was found on the left breast. Patient was called back to have diagnostic mammogram done on 08/08/2016 which showed left breast mass at 4:00 3 cm from nipple and a 4:00 6 cm of the nipple. Indeterminate left axillary lymph nodes.  Patient underwent biopsy on 08/15/2016. Pathology showed invasive lobular carcinoma for both breast lesion, . DCIS present, lymph node biopsy is positive for metastatic lobular carcinoma  MRI of the breast on 09/10/2016 showed the having small no mass area of progressive enhancement, left breast 2 biopsy proven malignancy seen in the left breast were separated by 3.4 cm band of weakly enhancing tissue. An area of stippled discontinuous no masses enhancement extends superiorly in the left breast in 2 the upper outer quadrant spanning approximately 3.2 cm anterior/superior to the more anterior of the 2 biopsy proven malignancy. Recommend MRI guided core biopsy of the treated area of non-mass enhancement. Patient was seen and evaluated by Dr. Tollie Pizza and position was left modified radical mastectomy, right simple mastectomy with sentinel node biopsy. Patient underwent surgery on 10/01/2016. SHe tolerated the procedure well.  She declined systemic chemotherapy or radiation therapy. She was started on Arimidex 45m daily since  September. Denies any hotflush or joint pain.  Her wound has healed well and she follows up with surgeon for seroma. She does not complaints about pain. She noticed a small area of fluid collection.   INTERVAL HISTORY Patient presents for follow-up of breast cancer. Recent CT showed radiographic signs of recurrent breast cancer.  Peritoneum carcinomatosis: [place] was done paracentesis can not be done due to small volume. CT biopsy was scheduled but patient says she could not make it as she has to take her husband to doctor's appointment.   Neoplasm related pain: located at left lower chest and diffuse abdominal pain and bloating: dull pain, constant, worse despite taking Oxycodone 534mevery 6 hours. No aggravating factors.  Fatigue: stable.  Constipation: last BM was 3 days ago. Not taking any bowel regimen.    ROS:  Review of Systems  Constitutional: Positive for fatigue. Negative for chills.  HENT:   Negative for hearing loss, lump/mass and nosebleeds.   Eyes: Negative for eye problems and icterus.  Respiratory: Positive for shortness of breath. Negative for chest tightness, cough and hemoptysis.   Cardiovascular: Positive for chest pain. Negative for leg swelling.  Gastrointestinal: Positive for abdominal distention, abdominal pain and constipation. Negative for diarrhea.  Endocrine: Negative for hot flashes.  Genitourinary: Negative for difficulty urinating, dyspareunia, frequency and hematuria.   Musculoskeletal: Negative for arthralgias, back pain and myalgias.       Chronic back pain  Skin: Negative for itching and rash.  Neurological: Negative for dizziness, headaches and light-headedness.  Hematological: Negative.  Negative for adenopathy. Does not bruise/bleed easily.  Psychiatric/Behavioral: The patient is not nervous/anxious.     MEDICAL HISTORY:  Past Medical History:  Diagnosis Date  . Anemia   .  Anxiety   . Arthritis   . Asthma    WELL CONTROLLED  . Breast  cancer ([place]) 2018   left breast cnacer  . Cancer ([place])    Bilat Mastectomy  . Depression   . GERD (gastroesophageal reflux disease)   . Headache    H/O  . Hyperlipidemia   . Metastatic breast cancer ([place]) 07/25/2017  . Metastatic breast cancer ([place]) 07/25/2017  . Sleep apnea    DOES NOT USE CPAP-COULD NOT TOLERATE  . Stroke [facility]) 2004    SURGICAL HISTORY: Past Surgical History:  Procedure Laterality Date  . ABDOMINAL HYSTERECTOMY    . BLADDER REPAIR    . BREAST BIOPSY Right 03/23/1991   Fibroadenoma with intramammary lymph node. Right breast, 9:00.  Marland Kitchen CHOLECYSTECTOMY    . COLONOSCOPY  2005  . MASTECTOMY MODIFIED RADICAL Left 10/01/2016   Procedure: MASTECTOMY MODIFIED RADICAL;  Surgeon: Robert Bellow, MD;  Location: [facility] ORS;  Service: General;  Laterality: Left;  . MR MRA CAROTID  02/2010   Minimal plaque formation; no significant stenosis. Sx: Syncope  . [place] [facility]  . SENTINEL NODE BIOPSY Right 10/01/2016   Procedure: SENTINEL NODE BIOPSY;  Surgeon: Robert Bellow, MD;  Location: [facility] ORS;  Service: General;  Laterality: Right;  . SIMPLE MASTECTOMY WITH AXILLARY SENTINEL NODE BIOPSY Right 10/01/2016   Procedure: SIMPLE MASTECTOMY;  Surgeon: Robert Bellow, MD;  Location: [facility] ORS;  Service: General;  Laterality: Right;    SOCIAL HISTORY: Social History   Socioeconomic History  . Marital status: Married    Spouse name: Not on file  . Number of children: 2  . Years of education: Not on file  . Highest education level: 12th grade  Occupational History  . Occupation: retired  Scientific laboratory technician  . Financial resource strain: Very hard  . Food insecurity:    Worry: Never true    Inability: Never true  . Transportation needs:    Medical: No    Non-medical: No  Tobacco Use  . Smoking status: Never Smoker  . Smokeless tobacco: Never Used  Substance and Sexual Activity  . Alcohol use: No  . Drug use: No  . Sexual activity: Never    Lifestyle  . Physical activity:    Days per week: Not on file    Minutes per session: Not on file  . Stress: Very much  Relationships  . Social connections:    Talks on phone: Not on file    Gets together: Not on file    Attends religious service: Not on file    Active member of club or organization: Not on file    Attends meetings of clubs or organizations: Not on file    Relationship status: Not on file  . Intimate partner violence:    Fear of current or ex partner: No    Emotionally abused: No    Physically abused: No    Forced sexual activity: No  Other Topics Concern  . Not on file  Social History Narrative  . Not on file    FAMILY HISTORY: Family History  Problem Relation Age of Onset  . Cancer Father   . Prostate cancer Father   . Heart attack Mother   . Hypertension Mother   . CAD Mother   . Hypertension Son   . Diabetes Son   . Lung cancer Brother   . Leukemia Brother   . Lung cancer Brother   .  Prostate cancer Brother   . Breast cancer Neg Hx     ALLERGIES:  is allergic to chocolate; other; sulfa antibiotics; atorvastatin; minocycline hcl; naproxen; niacin; septra [sulfamethoxazole-trimethoprim]; vioxx [rofecoxib]; and penicillins.  MEDICATIONS:  Current Outpatient Medications  Medication Sig Dispense Refill  . albuterol (PROVENTIL HFA;VENTOLIN HFA) 108 (90 Base) MCG/ACT inhaler Inhale 2 puffs into the lungs every 4 (four) hours as needed for wheezing or shortness of breath. 1 Inhaler 11  . ALPRAZolam (XANAX) 0.5 MG tablet Take 1 tablet (0.5 mg total) by mouth at bedtime as needed for anxiety. 30 tablet 5  . fexofenadine (ALLEGRA) 180 MG tablet Take 180 mg by mouth daily. PRN    . fluticasone (FLONASE) 50 MCG/ACT nasal spray Place 2 sprays into both nostrils daily. 16 g 5  . Multiple Vitamin (MULTIVITAMIN) capsule Take 1 capsule by mouth daily.    Marland Kitchen oxyCODONE (OXY IR/ROXICODONE) 5 MG immediate release tablet Take 1 tablet (5 mg total) by mouth every 4  (four) hours as needed for severe pain. 60 tablet 0  . pantoprazole (PROTONIX) 40 MG tablet Take 1 tablet (40 mg total) by mouth 2 (two) times daily before a meal. 180 tablet 1  . vitamin B-12 (CYANOCOBALAMIN) 500 MCG tablet Take 500 mcg by mouth daily.     Marland Kitchen oxyCODONE ER (XTAMPZA ER) 9 MG C12A Take 9 mg by mouth every 12 (twelve) hours. 28 each 0  . senna-docusate (SENNA S) 8.6-50 MG tablet Take 2 tablets by mouth daily. 60 tablet 3  . triamcinolone cream (KENALOG) 0.1 % Apply 1 application topically 2 (two) times daily. (Patient not taking: Reported on 07/31/2017) 30 g 0   No current facility-administered medications for this visit.       Marland Kitchen  PHYSICAL EXAMINATION: ECOG PERFORMANCE STATUS: 0 - Asymptomatic Vitals:   07/31/17 1435  BP: 127/79  Pulse: 75  Resp: 20  Temp: 98.4 F (36.9 C)   Filed Weights   07/31/17 1435  Weight: 171 lb 3 oz (77.7 kg)   Physical Exam  Constitutional: She is oriented to person, place, and time and well-developed, well-nourished, and in no distress. No distress.  HENT:  Head: Normocephalic and atraumatic.  Nose: Nose normal.  Mouth/Throat: Oropharynx is clear and moist. No oropharyngeal exudate.  Eyes: Pupils are equal, round, and reactive to light. EOM are normal. Left eye exhibits no discharge. No scleral icterus.  Neck: Normal range of motion. Neck supple. No JVD present.  Cardiovascular: Normal rate, regular rhythm and normal heart sounds. Exam reveals no friction rub.  No murmur heard. Pulmonary/Chest: Effort normal and breath sounds normal. No respiratory distress. She has no wheezes. She has no rales. She exhibits no tenderness.  Left rib cage pain.  Abdominal: Soft. Bowel sounds are normal. She exhibits distension. She exhibits no mass. There is no tenderness. There is no rebound and no guarding.  Musculoskeletal: Normal range of motion. She exhibits no edema or tenderness.  Lymphadenopathy:    She has no cervical adenopathy.  Neurological:  She is alert and oriented to person, place, and time. No cranial nerve deficit. She exhibits normal muscle tone. Coordination normal.  Skin: Skin is warm and dry. No rash noted. She is not diaphoretic. No erythema.  Psychiatric: Memory, affect and judgment normal.  Breast exam is performed in seated and lying down position. Patient is status post bilateral mastectomy.  Scars are well healed,  focal area over the sternum questionable seroma. LABORATORY DATA:  I have reviewed the data as  listed Lab Results  Component Value Date   WBC 9.2 07/31/2017   HGB 11.8 (L) 07/31/2017   HCT 35.2 07/31/2017   MCV 89.6 07/31/2017   PLT 379 07/31/2017   Recent Labs    07/23/17 1207 07/30/17 1526 07/31/17 1406  NA 137 136 136  K 4.1 3.5 4.1  CL 105 104 103  CO2 _0 GLUCOSE 150* 134* 140*  BUN _1 CREATININE 0.70 0.67 0.77  CALCIUM 10.0 10.0 10.0  GFRNONAA >60 >60 >60  GFRAA >60 >60 >60  PROT 6.9 7.0 6.9  ALBUMIN 3.6 3.6 3.5  AST 15 20 49*  ALT 11* 13* 44  ALKPHOS 48 49 69  BILITOT 0.5 0.5 0.8    Pathology 10/01/2016 SPECIMEN SUBMITTED:  A. Breast, left, and axillary contents  B. Breast, right; simple mastectomy  C. Sentinel node 1, right  DIAGNOSIS:  A. LEFT BREAST AND AXILLARY CONTENTS; MODIFIED RADICAL MASTECTOMY:  - INVASIVE CARCINOMA WITH DUCTAL AND LOBULAR FEATURES.  - LARGEST FOCUS OF INVASIVE CARCINOMA MEASURES 10 MM.  - NINETEEN OF NINETEEN LYMPH NODES POSITIVE FOR METASTASIS (19/19).  - BIOPSY SITE CHANGES, MARKER CLIP PRESENT.  - SURGICAL MARGINS ARE NEGATIVE.  B. RIGHT BREAST; SIMPLE MASTECTOMY:  - FIBROCYSTIC CHANGE.  - NEGATIVE FOR ATYPIA AND MALIGNANCY.  C. SENTINEL LYMPH NODE 1, RIGHT; EXCISION:  - NO TUMOR SEEN IN ONE LYMPH NODE (0/1).  Surgical Pathology Cancer Case Summary   INVASIVE CARCINOMA OF THEBREAST  Procedure: modified radical mastectomy  Specimen Laterality: left  Histologic Type: invasive carcinoma with ductal and lobular  features  Histologic Grade (Nottingham Histologic Score)       Glandular (Acinar)/Tubular Differentiation: 3       Nuclear Pleomorphism: 1       Mitotic Rate: 1       Overall Grade: 1  Tumor Size: 10 mm  Ductal Carcinoma in Situ (DCIS): present       Nuclear Grade: 1       Extensive intraductal component: negative  Lymphovascular Invasion: present  Treatment Effect: not applicable  Margins:       Invasive carcinoma: margins negative       Distance from closest margin: 20 mm from deep margin       DCIS: margins negative         Distance from closest margin: 20 mm from deep margin  Regional Lymph nodes:    Total # lymph nodes examined: 19    # Sentinel lymph nodes examined: 0    # Lymph nodes with macrometastasis (>2.0 mm): 18    # Lymph nodes with isolated tumor cells (<0.58m): 0    # Lymph nodes with micrometastasis (> 0.2 mm and < 2.0 mm): 1    Extranodal extension: focally present  Pathologic Stage Classification (pTNM, AJCC 7th Edition)    TNM Descriptors: m (multiple foci of invasive carcinoma)    pTNM: mpT1b pN3a   Note: Two masses are identified. The 3rd grossly described nodule in the  upper outer quadrant is an intramammary node nearly replaced by tumor.  Biomarker testing was performed on a separate specimen and in summary:  Estrogen Receptor (ER) Status: POSITIVE, >90%    Average intensity of staining: Strong  Progesterone Receptor (PgR) Status: POSITIVE, >90%    Average intensity of staining: Moderate  HER2 (by immunohistochemistry): NEGATIVE (Score 1+)    Pathology 10/01/2016 SPECIMEN SUBMITTED:  A. Breast, left, 4:00, 3 CMFN, biopsy  B. Breast, left, 4:00, 6  CMFN, biopsy  C. Lymph node, left axilla, biopsy  DIAGNOSIS:  A. BREAST, LEFT, 4 O'CLOCK 3 CM FROM NIPPLE; ULTRASOUND-GUIDED CORE  BIOPSY:  - INVASIVE LOBULAR CARCINOMA, CLASSIC TYPE.  Size of invasive carcinoma: 6 mm  in this sample  Histologic grade of invasive carcinoma: Grade 1    Glandular/tubular differentiation score: 3    Nuclear pleomorphism score: 1    Mitotic rate score: 1    Total score: 5  Ductal carcinomain situ: Present, low grade  Lymphovascular invasion: Not identified  B. BREAST, LEFT, 4 O'CLOCK 6 CM FROM NIPPLE; ULTRASOUND-GUIDED CORE  BIOPSY:  - INVASIVE LOBULAR CARCINOMA, CLASSIC TYPE.  Size of invasive carcinoma: 6 mm in this sample  Histologic grade of invasive carcinoma: Grade 1    Glandular/tubular differentiation score: 3    Nuclear pleomorphism score: 1    Mitotic rate score: 1    Total score: 5  Ductal carcinoma in situ: Not identified  Lymphovascular invasion: Not identified  C. LYMPH NODE, LEFT AXILLA; ULTRASOUND-GUIDED CORE BIOPSY:  - METASTATIC LOBULAR CARCINOMA; TUMOR SPANS 7 MM.  - NO EXTRANODAL EXTENSION IN THIS SAMPLE.   ASSESSMENT & PLAN:  77 yo female with stage III B, ER/PR positive HER-2 negative invasive carcinoma with both ductal and lobular features. She has 19 out of 19 axillary lymph nodes positive on the left. S/p lumpectomy and declined both chemotherapy and radiation, persistent elevated CA 27.29, previously declined adjuvant chemotherapy and CT scan surveillance, presents for discussion about recent CT scan which indicates disease recurrence.  Cancer Staging Malignant neoplasm of lower-outer quadrant of left breast of female, estrogen receptor positive ([place]) Staging form: Breast, AJCC 8th Edition - Clinical stage from 10/22/2016: Stage IIIB (cT1b(m), cN3a, cM0, G1, ER: Positive, PR: Positive, HER2: Negative) - Signed by Earlie Server, MD on 10/22/2016 - Pathologic stage from 10/22/2016: pT1b(m), pN3a, cM0, ER: Positive, PR: Positive, HER2: Negative - Signed by Earlie Server, MD on 10/22/2016  1. Abdominal carcinomatosis ([place])   2. Metastatic breast cancer ([place])   3. Neoplasm related pain   4. Drug-induced constipation    # Metastatic  breast cancer:  [place] was independently reviewed and discussed with patient. Due to small volume, paracentesis cannot be performed.  Plan CT guided biopsy of peritoneum to establish distant metastatic disease diagnosis.  Emphasize the imporance to getting biopsy done as soon as possible as without pathological diagnosis, can not start CDK 4/5 inhibitors.  Stop Arimidex.  Start Faslodex, first dose today.   # Neoplasm related pain:  Bone scan was independantly reviewed and discussed with patient that she does not have metastatic bone disease.  Abdominal pain due to carcinomatosis. She got a Rx for percocet which I advise her not to use multiple short acting. She tells me that she has not filled this prescription and will discard this Rx.  Refilled Oxycodone 53m Q4h as needed for pain, also plan to add long acting. Insurance only covers Xtampza [id number]. Rx sent to pharmacy and pharmacy says they don't have this medication. Will need to locate another pharmacy where this is available.   # Goal of care was discussed with patient, palliative intent. . # Constipation: due to narcotics. Send [place] to Pharmacy.  All questions were answered. The patient knows to call the clinic with any problems questions or concerns.  Return of visit: 2 weeks for 2nd dose of Faslodex and discussion of biopsy pathology.      ZhEarlie ServerMD, PhD Hematology Oncology [facility]  [facility] Pager- [phone] 07/31/2017

## 2017-08-01 ENCOUNTER — Other Ambulatory Visit: Payer: Self-pay | Admitting: *Deleted

## 2017-08-01 ENCOUNTER — Ambulatory Visit: Admission: RE | Admit: 2017-08-01 | Payer: Medicare Other | Source: Ambulatory Visit

## 2017-08-01 ENCOUNTER — Ambulatory Visit: Payer: Medicare Other

## 2017-08-01 MED ORDER — OXYCODONE ER 9 MG PO C12A: 9.0000 mg | EXTENDED_RELEASE_CAPSULE | Freq: Two times a day (BID) | ORAL | 0 refills | Status: DC

## 2017-08-01 NOTE — Telephone Encounter (Signed)
[facility] does not have Xtampza and will not get due to price. I checked with [facility] and they have it in stock so a new prescription needs to e sent to them.  I had spoken to patient and she told me that she is fine with it being sent to [facility] or [facility] since she lives close to both of them.

## 2017-08-06 ENCOUNTER — Other Ambulatory Visit: Payer: Self-pay | Admitting: Radiology

## 2017-08-07 ENCOUNTER — Ambulatory Visit
Admission: RE | Admit: 2017-08-07 | Discharge: 2017-08-07 | Disposition: A | Discharge status: Home / Self Care | Payer: Medicare Other | Source: Ambulatory Visit | Attending: Oncology | Admitting: Oncology

## 2017-08-07 DIAGNOSIS — F329 Major depressive disorder, single episode, unspecified: Secondary | ICD-10-CM | POA: Diagnosis not present

## 2017-08-07 DIAGNOSIS — Z880 Allergy status to penicillin: Secondary | ICD-10-CM | POA: Diagnosis not present

## 2017-08-07 DIAGNOSIS — C786 Secondary malignant neoplasm of retroperitoneum and peritoneum: Secondary | ICD-10-CM | POA: Insufficient documentation

## 2017-08-07 DIAGNOSIS — E785 Hyperlipidemia, unspecified: Secondary | ICD-10-CM | POA: Diagnosis not present

## 2017-08-07 DIAGNOSIS — F419 Anxiety disorder, unspecified: Secondary | ICD-10-CM | POA: Diagnosis not present

## 2017-08-07 DIAGNOSIS — Z882 Allergy status to sulfonamides status: Secondary | ICD-10-CM | POA: Diagnosis not present

## 2017-08-07 DIAGNOSIS — G4730 Sleep apnea, unspecified: Secondary | ICD-10-CM | POA: Insufficient documentation

## 2017-08-07 DIAGNOSIS — Z8673 Personal history of transient ischemic attack (TIA), and cerebral infarction without residual deficits: Secondary | ICD-10-CM | POA: Diagnosis not present

## 2017-08-07 DIAGNOSIS — Z170 Estrogen receptor positive status [ER+]: Secondary | ICD-10-CM

## 2017-08-07 DIAGNOSIS — Z7951 Long term (current) use of inhaled steroids: Secondary | ICD-10-CM | POA: Diagnosis not present

## 2017-08-07 DIAGNOSIS — M1990 Unspecified osteoarthritis, unspecified site: Secondary | ICD-10-CM | POA: Diagnosis not present

## 2017-08-07 DIAGNOSIS — Z8249 Family history of ischemic heart disease and other diseases of the circulatory system: Secondary | ICD-10-CM | POA: Insufficient documentation

## 2017-08-07 DIAGNOSIS — C50912 Malignant neoplasm of unspecified site of left female breast: Secondary | ICD-10-CM | POA: Diagnosis not present

## 2017-08-07 DIAGNOSIS — J45909 Unspecified asthma, uncomplicated: Secondary | ICD-10-CM | POA: Diagnosis not present

## 2017-08-07 DIAGNOSIS — R188 Other ascites: Secondary | ICD-10-CM | POA: Diagnosis not present

## 2017-08-07 DIAGNOSIS — K219 Gastro-esophageal reflux disease without esophagitis: Secondary | ICD-10-CM | POA: Diagnosis not present

## 2017-08-07 DIAGNOSIS — C762 Malignant neoplasm of abdomen: Secondary | ICD-10-CM

## 2017-08-07 DIAGNOSIS — C50512 Malignant neoplasm of lower-outer quadrant of left female breast: Secondary | ICD-10-CM

## 2017-08-07 DIAGNOSIS — C50919 Malignant neoplasm of unspecified site of unspecified female breast: Secondary | ICD-10-CM | POA: Diagnosis not present

## 2017-08-07 LAB — CBC
HEMATOCRIT: 38.4 % (ref 35.0–47.0)
Hemoglobin: 13 g/dL (ref 12.0–16.0)
MCH: 30.2 pg (ref 26.0–34.0)
MCHC: 33.9 g/dL (ref 32.0–36.0)
MCV: 89 fL (ref 80.0–100.0)
Platelets: 406 10*3/uL (ref 150–440)
RBC: 4.32 MIL/uL (ref 3.80–5.20)
RDW: 13.7 % (ref 11.5–14.5)
WBC: 8.5 10*3/uL (ref 3.6–11.0)

## 2017-08-07 LAB — PROTIME-INR
INR: 1.01
PROTHROMBIN TIME: 13.2 s (ref 11.4–15.2)

## 2017-08-07 MED ORDER — LIDOCAINE HCL (PF) 1 % IJ SOLN
INTRAMUSCULAR | Status: AC | PRN
  Administered 2017-08-07: 9 mL

## 2017-08-07 MED ORDER — MIDAZOLAM HCL 5 MG/5ML IJ SOLN
INTRAMUSCULAR | Status: AC | PRN
  Administered 2017-08-07: 1 mg via INTRAVENOUS
  Administered 2017-08-07: 0.5 mg via INTRAVENOUS

## 2017-08-07 MED ORDER — FENTANYL CITRATE (PF) 100 MCG/2ML IJ SOLN
INTRAMUSCULAR | Status: AC | PRN
  Administered 2017-08-07: 50 ug via INTRAVENOUS

## 2017-08-07 MED ORDER — FENTANYL CITRATE (PF) 100 MCG/2ML IJ SOLN
INTRAMUSCULAR | Status: AC
  Filled 2017-08-07: qty 4

## 2017-08-07 MED ORDER — MIDAZOLAM HCL 5 MG/5ML IJ SOLN
INTRAMUSCULAR | Status: AC
  Filled 2017-08-07: qty 5

## 2017-08-07 MED ORDER — SODIUM CHLORIDE 0.9 % IV SOLN: INTRAVENOUS | Status: DC

## 2017-08-07 NOTE — H&P (Signed)
Chief Complaint:   Here for CT bx peritoneal disease  Referring Physician(s): Yu,Zhou   History of Present Illness: Norma Johns is a 77 y.o. female with h/o breast ca.  CT imaging shows ascites and peritoneal carcinomatosis, new since the prior exam.  Here for Ct peritoneal bx. C/o mild diffuse abd distention and pain. O/w stable  Past Medical History:  Diagnosis Date  . Anemia   . Anxiety   . Arthritis   . Asthma    WELL CONTROLLED  . Breast cancer ([place]) 2018   left breast cnacer  . Cancer ([place])    Bilat Mastectomy  . Depression   . GERD (gastroesophageal reflux disease)   . Headache    H/O  . Hyperlipidemia   . Metastatic breast cancer ([place]) 07/25/2017  . Metastatic breast cancer ([place]) 07/25/2017  . Sleep apnea    DOES NOT USE CPAP-COULD NOT TOLERATE  . Stroke [facility]) 2004    Past Surgical History:  Procedure Laterality Date  . ABDOMINAL HYSTERECTOMY    . BLADDER REPAIR    . BREAST BIOPSY Right 03/23/1991   Fibroadenoma with intramammary lymph node. Right breast, 9:00.  Marland Kitchen CHOLECYSTECTOMY    . COLONOSCOPY  2005  . MASTECTOMY MODIFIED RADICAL Left 10/01/2016   Procedure: MASTECTOMY MODIFIED RADICAL;  Surgeon: Robert Bellow, MD;  Location: [facility] ORS;  Service: General;  Laterality: Left;  . MR MRA CAROTID  02/2010   Minimal plaque formation; no significant stenosis. Sx: Syncope  . [facility]  . SENTINEL NODE BIOPSY Right 10/01/2016   Procedure: SENTINEL NODE BIOPSY;  Surgeon: Robert Bellow, MD;  Location: [facility] ORS;  Service: General;  Laterality: Right;  . SIMPLE MASTECTOMY WITH AXILLARY SENTINEL NODE BIOPSY Right 10/01/2016   Procedure: SIMPLE MASTECTOMY;  Surgeon: Robert Bellow, MD;  Location: [facility] ORS;  Service: General;  Laterality: Right;    Allergies: Chocolate; Other; Sulfa antibiotics; Atorvastatin; Minocycline hcl; Naproxen; Niacin; Septra [sulfamethoxazole-trimethoprim]; Vioxx [rofecoxib]; and  Penicillins  Medications: Prior to Admission medications   Medication Sig Start Date End Date Taking? Authorizing Provider  albuterol (PROVENTIL HFA;VENTOLIN HFA) 108 (90 Base) MCG/ACT inhaler Inhale 2 puffs into the lungs every 4 (four) hours as needed for wheezing or shortness of breath. 06/05/17   Mar Daring, PA-C  ALPRAZolam Duanne Moron) 0.5 MG tablet Take 1 tablet (0.5 mg total) by mouth at bedtime as needed for anxiety. 03/21/17   Mar Daring, PA-C  fexofenadine (ALLEGRA) 180 MG tablet Take 180 mg by mouth daily. PRN    [provider]  fluticasone (FLONASE) 50 MCG/ACT nasal spray Place 2 sprays into both nostrils daily. 06/05/17   Mar Daring, PA-C  Multiple Vitamin (MULTIVITAMIN) capsule Take 1 capsule by mouth daily.    [provider]  oxyCODONE (OXY IR/ROXICODONE) 5 MG immediate release tablet Take 1 tablet (5 mg total) by mouth every 4 (four) hours as needed for severe pain. 07/31/17   Earlie Server, MD  oxyCODONE ER [facility]) 9 MG C12A Take 9 mg by mouth every 12 (twelve) hours. 08/01/17   Earlie Server, MD  pantoprazole (PROTONIX) 40 MG tablet Take 1 tablet (40 mg total) by mouth 2 (two) times daily before a meal. 07/03/17   Burnette, Clearnce Sorrel, PA-C  senna-docusate (SENNA S) 8.6-50 MG tablet Take 2 tablets by mouth daily. 07/31/17   Earlie Server, MD  triamcinolone cream (KENALOG) 0.1 % Apply 1 application topically  2 (two) times daily. Patient not taking: Reported on 07/31/2017 06/11/17   Mar Daring, PA-C  vitamin B-12 (CYANOCOBALAMIN) 500 MCG tablet Take 500 mcg by mouth daily.     [provider]     Family History  Problem Relation Age of Onset  . Cancer Father   . Prostate cancer Father   . Heart attack Mother   . Hypertension Mother   . CAD Mother   . Hypertension Son   . Diabetes Son   . Lung cancer Brother   . Leukemia Brother   . Lung cancer Brother   . Prostate cancer Brother   . Breast cancer Neg Hx     Social History    Socioeconomic History  . Marital status: Married    Spouse name: Not on file  . Number of children: 2  . Years of education: Not on file  . Highest education level: 12th grade  Occupational History  . Occupation: retired  Scientific laboratory technician  . Financial resource strain: Very hard  . Food insecurity:    Worry: Never true    Inability: Never true  . Transportation needs:    Medical: No    Non-medical: No  Tobacco Use  . Smoking status: Never Smoker  . Smokeless tobacco: Never Used  Substance and Sexual Activity  . Alcohol use: No  . Drug use: No  . Sexual activity: Never  Lifestyle  . Physical activity:    Days per week: Not on file    Minutes per session: Not on file  . Stress: Very much  Relationships  . Social connections:    Talks on phone: Not on file    Gets together: Not on file    Attends religious service: Not on file    Active member of club or organization: Not on file    Attends meetings of clubs or organizations: Not on file    Relationship status: Not on file  Other Topics Concern  . Not on file  Social History Narrative  . Not on file    ECOG Status: 2 - Symptomatic, <50% confined to bed  Review of Systems: A 12 point ROS discussed and pertinent positives are indicated in the HPI above.  All other systems are negative.  Review of Systems  Vital Signs: BP (!) 150/88   Pulse 81   Temp 98.1 F (36.7 C) (Oral)   Resp 14   SpO2 98%   Physical Exam  Constitutional: She is oriented to person, place, and time. She appears well-developed and well-nourished. No distress.  Eyes: Conjunctivae are normal. No scleral icterus.  Cardiovascular: Normal rate and regular rhythm.  Pulmonary/Chest: Effort normal and breath sounds normal.  Abdominal: Soft. Bowel sounds are normal. She exhibits distension. There is tenderness. There is no rebound.  Neurological: She is alert and oriented to person, place, and time.  Skin: She is not diaphoretic.  Psychiatric: She  has a normal mood and affect.    Imaging: Dg Chest 2 View  Result Date: 07/30/2017 CLINICAL DATA:  Shortness of breath and near syncope. History of breast cancer. EXAM: CHEST - 2 VIEW COMPARISON:  CT chest dated Jul 17, 2017. Chest x-ray dated December 31, 2012. FINDINGS: The heart size and mediastinal contours are within normal limits. Mild interstitial prominence bilaterally. Small left pleural effusion with left lower lobe atelectasis. No pneumothorax. No acute osseous abnormality. IMPRESSION: 1. Unchanged small left pleural effusion with adjacent left lower lobe atelectasis. Electronically Signed   By:  Titus Dubin M.D.   On: 07/30/2017 16:06   Dg Abdomen 1 View  Result Date: 07/30/2017 CLINICAL DATA:  77 year old female with near syncopal episode abdominal pain post chest CT today. EXAM: ABDOMEN - 1 VIEW COMPARISON:  07/30/2017 chest CT.  07/17/2017 CT abdomen and pelvis. FINDINGS: Residual contrast in renal collecting system/bladder. Gas-filled top-normal size colon splenic flexure level. Nonspecific gas collection right upper quadrant. Findings similar to scout view of recent chest CT. CT detected ascites and peritoneal carcinomatosis not adequately assessed by plain film exam. IMPRESSION: Gas-filled top-normal size colon splenic flexure level. Nonspecific gas collection right upper quadrant. Findings similar to scout view of recent chest CT. CT detected ascites and peritoneal carcinomatosis not adequately assessed by plain film exam. Electronically Signed   By: Genia Del M.D.   On: 07/30/2017 20:19   Ct Chest W Contrast  Result Date: 07/17/2017 CLINICAL DATA:  Left breast cancer follow-up. EXAM: CT CHEST, ABDOMEN, AND PELVIS WITH CONTRAST TECHNIQUE: Multidetector CT imaging of the chest, abdomen and pelvis was performed following the standard protocol during bolus administration of intravenous contrast. CONTRAST:  12mL ISOVUE-300 IOPAMIDOL (ISOVUE-300) INJECTION 61% COMPARISON:  None.  10/30/2016 FINDINGS: CT CHEST FINDINGS Cardiovascular: No significant vascular findings. Normal heart size. No pericardial effusion. Mediastinum/Nodes: Normal appearance of the thyroid gland. The trachea appears patent and is midline. Normal appearance of the esophagus. No enlarged mediastinal or hilar lymph nodes. No supraclavicular or axillary adenopathy. Lungs/Pleura: There are small bilateral pleural effusions, left greater than right. Index nodule in the left base is unchanged measuring 7 mm, image 85/4. There is a new irregular nodule within the left upper lobe which measures 5 mm, image 39/4. Two subpleural nodules in the right middle lobe are unchanged measuring 3 mm, image 75/4 and image 74/4. 4 mm lung nodule in the left base is new from previous exam, 91/4. Also new is a right upper lobe lung nodule measuring 4 mm, image 43/7. New anterior left upper lobe lung nodule measures 4 mm, image 30/4. Musculoskeletal: Spondylosis identified within the thoracic spine. No suspicious bone lesions. Status post bilateral mastectomy. CT ABDOMEN PELVIS FINDINGS Hepatobiliary: No focal liver abnormality is seen. Status post cholecystectomy. No biliary dilatation. Pancreas: Unremarkable. No pancreatic ductal dilatation or surrounding inflammatory changes. Spleen: Normal in size without focal abnormality. Adrenals/Urinary Tract: Normal appearance of the adrenal glands. 3 mm stone within the inferior pole of the left kidney is identified, image 63/2. No mass or hydronephrosis identified. The urinary bladder appears normal. Stomach/Bowel: The stomach and small bowel loops have a normal course and caliber. There is no pathologic dilatation of the colon. Vascular/Lymphatic: Normal appearance of the abdominal aorta. No abdominal or pelvic adenopathy. Reproductive: Uterus appears surgically absent.  No adnexal mass. Other: Interval development of moderate volume of ascites within the abdomen and pelvis. Peritoneal carcinomatosis  with omental caking is identified, image 81/2. The omental cake has a thickness of approximately 2.3 cm. Peritoneal implant within the upper abdomen slightly left of the midline measures 3.5 cm, image 56/2. Musculoskeletal: Degenerative disc disease identified within the lumbar spine. No suspicious bone lesions. IMPRESSION: 1. Interval development of peritoneal carcinomatosis. 2. Scattered new small pulmonary nodules are noted in both lungs. Cannot rule out foci of metastatic disease. 3. New bilateral pleural effusions. 4. Nonobstructing left renal calculus. Electronically Signed   By: Kerby Moors M.D.   On: 07/17/2017 11:38   Ct Angio Chest Pe W And/or Wo Contrast  Result Date: 07/30/2017 CLINICAL DATA:  PE  suspected, intermediate prob, positive D-dimer. Acute onset of shortness of breath earlier today when here for a bone scan. Abdominal pain. EXAM: CT ANGIOGRAPHY CHEST WITH CONTRAST TECHNIQUE: Multidetector CT imaging of the chest was performed using the standard protocol during bolus administration of intravenous contrast. Multiplanar CT image reconstructions and MIPs were obtained to evaluate the vascular anatomy. CONTRAST:  32mL ISOVUE-370 IOPAMIDOL (ISOVUE-370) INJECTION 76% COMPARISON:  Chest and abdominal CT 07/17/2017 FINDINGS: Cardiovascular: There are no filling defects within the pulmonary arteries to suggest pulmonary embolus. Tortuosity of the thoracic aorta without dissection. Common origin of the left common carotid and brachiocephalic artery, normal variant arch configuration. Upper normal heart size. No pericardial effusion. Mediastinum/Nodes: Increase size of right hilar lymph nodes from prior exam currently 10 and 11 mm short axis, previously 7 mm. Rounded fluid density structure adjacent to the lower esophagus likely represents loculated ascites rather than paraesophageal node. Possible 5 mm right internal mammary node versus adjacent chondral cartilage. Small anterior right epicardial  nodes are unchanged. No axillary adenopathy. Post bilateral mastectomy. Lungs/Pleura: Evaluation of pulmonary nodules seen on prior exam is suboptimal due to breathing motion artifact. Majority these nodules are small measuring approximately 4 mm, and unchanged. However a right lower lobe nodule image 49 series 6 has increased in size currently 7 mm, previously 4 mm. The diaphragmatic nodule in the left lower lobe on prior exam is currently obscured. Left pleural effusion is increased, now small to moderate. There is adjacent compressive atelectasis. Tiny right pleural effusion. Minimal patchy ground-glass opacities in the right lower and left upper lobes are likely atelectasis. Upper Abdomen: Increased upper abdominal ascites since prior abdominal CT, moderate in degree. Musculoskeletal: There are no acute or suspicious osseous abnormalities. Degenerative change in the spine. Vertebral body hemangioma in T3, not hypermetabolic and bone scan earlier today. Review of the MIP images confirms the above findings. IMPRESSION: 1. No pulmonary embolus. 2. Increased left pleural effusion development of small right pleural effusion since exam 2 weeks ago. Increased upper abdominal ascites. 3. Small pulmonary nodules, majority of which are unchanged or not as well evaluated, however definite increase size of a right lower lobe pulmonary nodule over the past 2 weeks suggesting progression of metastatic disease. 4. New borderline right hilar lymph nodes measuring up to 11 mm short axis, nonspecific in the setting for reactive versus metastatic. Electronically Signed   By: Jeb Levering M.D.   On: 07/30/2017 19:08   Nm Bone Scan Whole Body  Result Date: 07/30/2017 CLINICAL DATA:  Breast cancer.  Restaging.  Vomiting from pain. EXAM: NUCLEAR MEDICINE WHOLE BODY BONE SCAN TECHNIQUE: Whole body anterior and posterior images were obtained approximately 3 hours after intravenous injection of radiopharmaceutical.  RADIOPHARMACEUTICALS:  21.4 mCi Technetium-55m MDP IV COMPARISON:  Bone scan 10/30/2016, PET-CT scan 08/30/2016 FINDINGS: No focal uptake in the axillary or appendicular skeleton to localize skeletal metastasis. Diffuse uptake in the anterior calvarium is similar comparison exam in favored physiologic or metabolic. IMPRESSION: No scintigraphic evidence skeletal metastasis. Electronically Signed   By: Suzy Bouchard M.D.   On: 07/30/2017 16:58   Ct Abdomen Pelvis W Contrast  Result Date: 07/17/2017 CLINICAL DATA:  Left breast cancer follow-up. EXAM: CT CHEST, ABDOMEN, AND PELVIS WITH CONTRAST TECHNIQUE: Multidetector CT imaging of the chest, abdomen and pelvis was performed following the standard protocol during bolus administration of intravenous contrast. CONTRAST:  125mL ISOVUE-300 IOPAMIDOL (ISOVUE-300) INJECTION 61% COMPARISON:  None. 10/30/2016 FINDINGS: CT CHEST FINDINGS Cardiovascular: No significant vascular findings. Normal heart  size. No pericardial effusion. Mediastinum/Nodes: Normal appearance of the thyroid gland. The trachea appears patent and is midline. Normal appearance of the esophagus. No enlarged mediastinal or hilar lymph nodes. No supraclavicular or axillary adenopathy. Lungs/Pleura: There are small bilateral pleural effusions, left greater than right. Index nodule in the left base is unchanged measuring 7 mm, image 85/4. There is a new irregular nodule within the left upper lobe which measures 5 mm, image 39/4. Two subpleural nodules in the right middle lobe are unchanged measuring 3 mm, image 75/4 and image 74/4. 4 mm lung nodule in the left base is new from previous exam, 91/4. Also new is a right upper lobe lung nodule measuring 4 mm, image 43/7. New anterior left upper lobe lung nodule measures 4 mm, image 30/4. Musculoskeletal: Spondylosis identified within the thoracic spine. No suspicious bone lesions. Status post bilateral mastectomy. CT ABDOMEN PELVIS FINDINGS Hepatobiliary: No  focal liver abnormality is seen. Status post cholecystectomy. No biliary dilatation. Pancreas: Unremarkable. No pancreatic ductal dilatation or surrounding inflammatory changes. Spleen: Normal in size without focal abnormality. Adrenals/Urinary Tract: Normal appearance of the adrenal glands. 3 mm stone within the inferior pole of the left kidney is identified, image 63/2. No mass or hydronephrosis identified. The urinary bladder appears normal. Stomach/Bowel: The stomach and small bowel loops have a normal course and caliber. There is no pathologic dilatation of the colon. Vascular/Lymphatic: Normal appearance of the abdominal aorta. No abdominal or pelvic adenopathy. Reproductive: Uterus appears surgically absent.  No adnexal mass. Other: Interval development of moderate volume of ascites within the abdomen and pelvis. Peritoneal carcinomatosis with omental caking is identified, image 81/2. The omental cake has a thickness of approximately 2.3 cm. Peritoneal implant within the upper abdomen slightly left of the midline measures 3.5 cm, image 56/2. Musculoskeletal: Degenerative disc disease identified within the lumbar spine. No suspicious bone lesions. IMPRESSION: 1. Interval development of peritoneal carcinomatosis. 2. Scattered new small pulmonary nodules are noted in both lungs. Cannot rule out foci of metastatic disease. 3. New bilateral pleural effusions. 4. Nonobstructing left renal calculus. Electronically Signed   By: Kerby Moors M.D.   On: 07/17/2017 11:38   [place] Ascites (abdomen Limited)  Result Date: 07/25/2017 CLINICAL DATA:  History of breast cancer, now with peritoneal carcinomatosis. Please perform evaluate abdomen for malignant ascites and perform ultrasound-guided paracentesis as indicated. EXAM: LIMITED ABDOMEN ULTRASOUND FOR ASCITES TECHNIQUE: Limited ultrasound survey for ascites was performed in all four abdominal quadrants. COMPARISON:  CT of the chest, abdomen and pelvis-07/17/2017  FINDINGS: Sonographic evaluation performed by the dictating interventional radiologist demonstrates only a trace amount of intra-abdominal ascites located within left upper abdominal quadrant (image 5). IMPRESSION: Trace amount of intra-abdominal ascites located within the left upper abdominal quadrant, too small to allow for safe ultrasound-guided paracentesis. Electronically Signed   By: Sandi Mariscal M.D.   On: 07/25/2017 14:06    Labs:  CBC: Recent Labs    07/23/17 1207 07/30/17 1526 07/31/17 1406 08/07/17 0909  WBC 7.0 9.6 9.2 8.5  HGB 12.1 12.3 11.8* 13.0  HCT 35.9 36.4 35.2 38.4  PLT 329 372 379 406    COAGS: Recent Labs    08/07/17 0909  INR 1.01    BMP: Recent Labs    05/13/17 1300 07/17/17 1101 07/23/17 1207 07/30/17 1526 07/31/17 1406  NA 138  --  137 136 136  K 3.7  --  4.1 3.5 4.1  CL 107  --  105 104 103  CO2 25  --  25 24 26   GLUCOSE 150*  --  150* 134* 140*  BUN 12  --  12 10 12   CALCIUM 9.8  --  10.0 10.0 10.0  CREATININE 0.80 0.70 0.70 0.67 0.77  GFRNONAA >60  --  >60 >60 >60  GFRAA >60  --  >60 >60 >60    LIVER FUNCTION TESTS: Recent Labs    05/13/17 1300 07/23/17 1207 07/30/17 1526 07/31/17 1406  BILITOT 0.5 0.5 0.5 0.8  AST 16 15 20  49*  ALT 11* 11* 13* 44  ALKPHOS 44 48 49 69  PROT 6.7 6.9 7.0 6.9  ALBUMIN 3.7 3.6 3.6 3.5    TUMOR MARKERS: No results for input(s): AFPTM, CEA, CA199, CHROMGRNA in the last 8760 hours.  Assessment and Plan:  Breast cancer pt with new peritoneal carcinomatosis and ascites.  plan for omental bx with CT today.  Risks and benefits discussed with the patient including, but not limited to bleeding, infection, damage to adjacent structures or low yield requiring additional tests.  All of the patient's questions were answered, patient is agreeable to proceed. Consent signed and in chart.    Thank you for this interesting consult.  I greatly enjoyed meeting Norma Johns and look forward to  participating in their care.  A copy of this report was sent to the requesting provider on this date.  Electronically Signed: Greggory Keen, MD 08/07/2017, 9:33 AM   I spent a total of  15 Minutes   in face to face in clinical consultation, greater than 50% of which was counseling/coordinating care for this pt with breast ca.

## 2017-08-07 NOTE — Procedures (Signed)
Breast ca  Peritoneal carcinomatosis  S/p CT omental core bx  No comp Stable Path pending Full report in pacs

## 2017-08-09 ENCOUNTER — Ambulatory Visit: Payer: Medicare Other

## 2017-08-09 ENCOUNTER — Other Ambulatory Visit: Payer: Self-pay | Admitting: Oncology

## 2017-08-09 DIAGNOSIS — C786 Secondary malignant neoplasm of retroperitoneum and peritoneum: Secondary | ICD-10-CM

## 2017-08-09 DIAGNOSIS — C801 Malignant (primary) neoplasm, unspecified: Principal | ICD-10-CM

## 2017-08-11 ENCOUNTER — Other Ambulatory Visit: Payer: Self-pay

## 2017-08-11 ENCOUNTER — Emergency Department
Admission: EM | Admit: 2017-08-11 | Discharge: 2017-08-11 | Disposition: A | Discharge status: Home / Self Care | Payer: Medicare Other | Attending: Student in an Organized Health Care Education/Training Program | Admitting: Student in an Organized Health Care Education/Training Program

## 2017-08-11 ENCOUNTER — Emergency Department: Payer: Medicare Other

## 2017-08-11 ENCOUNTER — Encounter: Payer: Self-pay | Admitting: Emergency Medicine

## 2017-08-11 DIAGNOSIS — J45909 Unspecified asthma, uncomplicated: Secondary | ICD-10-CM | POA: Diagnosis not present

## 2017-08-11 DIAGNOSIS — R112 Nausea with vomiting, unspecified: Secondary | ICD-10-CM

## 2017-08-11 DIAGNOSIS — E860 Dehydration: Secondary | ICD-10-CM | POA: Diagnosis not present

## 2017-08-11 DIAGNOSIS — Z79899 Other long term (current) drug therapy: Secondary | ICD-10-CM | POA: Diagnosis not present

## 2017-08-11 DIAGNOSIS — R1013 Epigastric pain: Secondary | ICD-10-CM | POA: Insufficient documentation

## 2017-08-11 DIAGNOSIS — C786 Secondary malignant neoplasm of retroperitoneum and peritoneum: Secondary | ICD-10-CM | POA: Diagnosis not present

## 2017-08-11 DIAGNOSIS — E119 Type 2 diabetes mellitus without complications: Secondary | ICD-10-CM | POA: Insufficient documentation

## 2017-08-11 DIAGNOSIS — I10 Essential (primary) hypertension: Secondary | ICD-10-CM | POA: Diagnosis not present

## 2017-08-11 LAB — COMPREHENSIVE METABOLIC PANEL
ALT: 13 U/L — ABNORMAL LOW (ref 14–54)
AST: 20 U/L (ref 15–41)
Albumin: 3.8 g/dL (ref 3.5–5.0)
Alkaline Phosphatase: 50 U/L (ref 38–126)
Anion gap: 8 (ref 5–15)
BUN: 8 mg/dL (ref 6–20)
CO2: 24 mmol/L (ref 22–32)
Calcium: 9.4 mg/dL (ref 8.9–10.3)
Chloride: 101 mmol/L (ref 101–111)
Creatinine, Ser: 0.5 mg/dL (ref 0.44–1.00)
GFR calc Af Amer: >60 mL/min (ref >60)
GFR calc non Af Amer: >60 mL/min (ref >60)
Glucose, Bld: 131 mg/dL — ABNORMAL HIGH (ref 65–99)
Potassium: 3.9 mmol/L (ref 3.5–5.1)
Sodium: 133 mmol/L — ABNORMAL LOW (ref 135–145)
Total Bilirubin: 0.7 mg/dL (ref 0.3–1.2)
Total Protein: 7.5 g/dL (ref 6.5–8.1)

## 2017-08-11 LAB — CBC
HCT: 37.9 % (ref 35.0–47.0)
Hemoglobin: 12.8 g/dL (ref 12.0–16.0)
MCH: 29.8 pg (ref 26.0–34.0)
MCHC: 33.8 g/dL (ref 32.0–36.0)
MCV: 88.1 fL (ref 80.0–100.0)
Platelets: 396 10*3/uL (ref 150–440)
RBC: 4.3 MIL/uL (ref 3.80–5.20)
RDW: 13.3 % (ref 11.5–14.5)
WBC: 11.2 10*3/uL — ABNORMAL HIGH (ref 3.6–11.0)

## 2017-08-11 LAB — URINALYSIS, COMPLETE (UACMP) WITH MICROSCOPIC
Bacteria, UA: NONE SEEN
Bilirubin Urine: NEGATIVE
Glucose, UA: NEGATIVE mg/dL
KETONES UR: NEGATIVE mg/dL
Nitrite: NEGATIVE
PH: 6 (ref 5.0–8.0)
PROTEIN: NEGATIVE mg/dL
Specific Gravity, Urine: >1.046 — ABNORMAL HIGH (ref 1.005–1.030)

## 2017-08-11 LAB — LIPASE, BLOOD: LIPASE: 26 U/L (ref 11–51)

## 2017-08-11 MED ORDER — METOCLOPRAMIDE HCL 5 MG/ML IJ SOLN
10.0000 mg | Freq: Once | INTRAMUSCULAR | Status: AC
  Administered 2017-08-11: 10 mg via INTRAVENOUS
  Filled 2017-08-11: qty 2

## 2017-08-11 MED ORDER — SODIUM CHLORIDE 0.9 % IV BOLUS
1000.0000 mL | Freq: Once | INTRAVENOUS | Status: AC
Start: 2017-08-11 — End: 2017-08-11
  Administered 2017-08-11: 1000 mL via INTRAVENOUS

## 2017-08-11 MED ORDER — ONDANSETRON HCL 4 MG/2ML IJ SOLN
INTRAMUSCULAR | Status: AC
  Filled 2017-08-11: qty 2

## 2017-08-11 MED ORDER — ONDANSETRON HCL 4 MG/2ML IJ SOLN
4.0000 mg | Freq: Once | INTRAMUSCULAR | Status: AC | PRN
  Administered 2017-08-11: 4 mg via INTRAVENOUS

## 2017-08-11 MED ORDER — MORPHINE SULFATE (PF) 4 MG/ML IV SOLN
4.0000 mg | INTRAVENOUS | Status: DC | PRN
  Administered 2017-08-11: 4 mg via INTRAVENOUS
  Filled 2017-08-11: qty 1

## 2017-08-11 MED ORDER — IOPAMIDOL (ISOVUE-300) INJECTION 61%
100.0000 mL | Freq: Once | INTRAVENOUS | Status: AC | PRN
  Administered 2017-08-11: 100 mL via INTRAVENOUS

## 2017-08-11 MED ORDER — ONDANSETRON 4 MG PO TBDP: 4.0000 mg | ORAL_TABLET | Freq: Three times a day (TID) | ORAL | 0 refills | Status: DC | PRN

## 2017-08-11 NOTE — Discharge Instructions (Signed)

## 2017-08-11 NOTE — ED Notes (Signed)
Pt ambulated approximately 12ft with the use of a walker (pt reports using a walker at home at baseline). Pt denies any reports of lightheadedness or dizziness, no c/o SHOB or CP. Pt noted to ambulate with a slow, but steady and even gait. When asked, pt states she she feels safe to be d/c'd home at this time.

## 2017-08-11 NOTE — ED Triage Notes (Signed)
Pt arrived via POV with reports of increased weakness and poor appetite for the past few days.  Pt c/o vomiting x 3 today.  Pt denies any diarrhea.  Pt has cancer that has spread to her stomach. Pt also c/o right side back pain that started today, pt unable to sit still in triage. Pt describes the feeling as a "catch"

## 2017-08-11 NOTE — ED Provider Notes (Signed)
[facility] Emergency Department Provider Note    First MD Initiated Contact with Patient 08/11/17 1658     (approximate)  I have reviewed the triage vital signs and the nursing notes.   HISTORY  Chief Complaint Emesis and Generalized Body Aches    HPI Norma Johns is a 77 y.o. female history of breast cancer with mets to her stomach status post recent stomach biopsy presents to the ER for nausea epigastric discomfort and vomiting.  Feels very dehydrated and weak.  Is unable to keep any food down her water.  She feels that she is passing gas and ending her bowels.  Denies any chest pain or shortness of breath.  No recent fevers.  Was recently on antibiotic for UTI but culture was reportedly negative so she is not on any antibiotics at this time.  States the pain is nonradiating and describes it is mild.    Past Medical History:  Diagnosis Date  . Anemia   . Anxiety   . Arthritis   . Asthma    WELL CONTROLLED  . Breast cancer ([place]) 2018   left breast cnacer  . Cancer ([place])    Bilat Mastectomy  . Depression   . GERD (gastroesophageal reflux disease)   . Headache    H/O  . Hyperlipidemia   . Metastatic breast cancer ([place]) 07/25/2017  . Metastatic breast cancer ([place]) 07/25/2017  . Sleep apnea    DOES NOT USE CPAP-COULD NOT TOLERATE  . Stroke [facility]) 2004   Family History  Problem Relation Age of Onset  . Cancer Father   . Prostate cancer Father   . Heart attack Mother   . Hypertension Mother   . CAD Mother   . Hypertension Son   . Diabetes Son   . Lung cancer Brother   . Leukemia Brother   . Lung cancer Brother   . Prostate cancer Brother   . Breast cancer Neg Hx    Past Surgical History:  Procedure Laterality Date  . ABDOMINAL HYSTERECTOMY    . BLADDER REPAIR    . BREAST BIOPSY Right 03/23/1991   Fibroadenoma with intramammary lymph node. Right breast, 9:00.  Marland Kitchen CHOLECYSTECTOMY    . COLONOSCOPY  2005  . MASTECTOMY MODIFIED RADICAL  Left 10/01/2016   Procedure: MASTECTOMY MODIFIED RADICAL;  Surgeon: Robert Bellow, MD;  Location: [facility] ORS;  Service: General;  Laterality: Left;  . MR MRA CAROTID  02/2010   Minimal plaque formation; no significant stenosis. Sx: Syncope  . [place] [facility]  . SENTINEL NODE BIOPSY Right 10/01/2016   Procedure: SENTINEL NODE BIOPSY;  Surgeon: Robert Bellow, MD;  Location: [facility] ORS;  Service: General;  Laterality: Right;  . SIMPLE MASTECTOMY WITH AXILLARY SENTINEL NODE BIOPSY Right 10/01/2016   Procedure: SIMPLE MASTECTOMY;  Surgeon: Robert Bellow, MD;  Location: [facility] ORS;  Service: General;  Laterality: Right;   Patient Active Problem List   Diagnosis Date Noted  . Metastatic breast cancer ([place]) 07/25/2017  . Goals of care, counseling/discussion 07/22/2017  . MRSA (methicillin resistant staph aureus) culture positive 04/05/2017  . Lymphedema 02/27/2017  . Shoulder weakness 02/27/2017  . Breast cancer ([place]) 10/01/2016  . Invasive lobular carcinoma of breast, stage 2, left ([place]) 09/18/2016  . Malignant neoplasm of lower-outer quadrant of left breast of female, estrogen receptor positive ([place]) 08/25/2016  . History of CVA with residual deficit 04/17/2016  . Low back pain 06/13/2015  .  Anxiety 08/24/2014  . Atrophic vaginitis 08/24/2014  . Body mass index (BMI) of 29.0-29.9 in adult 08/24/2014  . Cyst of nasal sinus 08/24/2014  . Degenerative joint disease 08/24/2014  . OP (osteoporosis) 08/24/2014  . Bulging eyes 08/24/2014  . GERD (gastroesophageal reflux disease) 08/16/2014  . History of colon polyps 09/21/2008  . Chronic recurrent sinusitis 09/17/2008  . Diabetes mellitus, type 2 ([place]) 08/03/2008  . Hypercholesteremia 06/22/2008  . Cannot sleep 03/23/2008  . Allergic rhinitis 01/08/2008  . Airway hyperreactivity 01/08/2008  . Carotid artery obstruction 01/08/2008  . Clinical depression 01/08/2008  . Benign hypertension 01/08/2008      Prior to  Admission medications   Medication Sig Start Date End Date Taking? Authorizing Provider  albuterol (PROVENTIL HFA;VENTOLIN HFA) 108 (90 Base) MCG/ACT inhaler Inhale 2 puffs into the lungs every 4 (four) hours as needed for wheezing or shortness of breath. 06/05/17   Mar Daring, PA-C  ALPRAZolam Duanne Moron) 0.5 MG tablet Take 1 tablet (0.5 mg total) by mouth at bedtime as needed for anxiety. 03/21/17   Mar Daring, PA-C  fexofenadine (ALLEGRA) 180 MG tablet Take 180 mg by mouth daily. PRN    [provider]  fluticasone (FLONASE) 50 MCG/ACT nasal spray Place 2 sprays into both nostrils daily. 06/05/17   Mar Daring, PA-C  Multiple Vitamin (MULTIVITAMIN) capsule Take 1 capsule by mouth daily.    [provider]  ondansetron (ZOFRAN ODT) 4 MG disintegrating tablet Take 1 tablet (4 mg total) by mouth every 8 (eight) hours as needed for nausea or vomiting. 08/11/17   Merlyn Lot, MD  oxyCODONE (OXY IR/ROXICODONE) 5 MG immediate release tablet Take 1 tablet (5 mg total) by mouth every 4 (four) hours as needed for severe pain. 07/31/17   Earlie Server, MD  oxyCODONE ER [facility] ER) 9 MG C12A Take 9 mg by mouth every 12 (twelve) hours. 08/01/17   Earlie Server, MD  pantoprazole (PROTONIX) 40 MG tablet Take 1 tablet (40 mg total) by mouth 2 (two) times daily before a meal. 07/03/17   Burnette, Clearnce Sorrel, PA-C  senna-docusate (SENNA S) 8.6-50 MG tablet Take 2 tablets by mouth daily. 07/31/17   Earlie Server, MD  triamcinolone cream (KENALOG) 0.1 % Apply 1 application topically 2 (two) times daily. Patient not taking: Reported on 07/31/2017 06/11/17   Mar Daring, PA-C  vitamin B-12 (CYANOCOBALAMIN) 500 MCG tablet Take 500 mcg by mouth daily.     [provider]    Allergies Chocolate; Other; Sulfa antibiotics; Atorvastatin; Minocycline hcl; Naproxen; Niacin; Septra [sulfamethoxazole-trimethoprim]; Vioxx [rofecoxib]; and Penicillins    Social History Social History    Tobacco Use  . Smoking status: Never Smoker  . Smokeless tobacco: Never Used  Substance Use Topics  . Alcohol use: No  . Drug use: No    Review of Systems Patient denies headaches, rhinorrhea, blurry vision, numbness, shortness of breath, chest pain, edema, cough, abdominal pain, nausea, vomiting, diarrhea, dysuria, fevers, rashes or hallucinations unless otherwise stated above in HPI. ____________________________________________   PHYSICAL EXAM:  VITAL SIGNS: Vitals:   08/11/17 2100 08/11/17 2200  BP: (!) 142/70 129/79  Pulse: 87   Resp: 17   Temp:    SpO2: 94%     Constitutional: Alert and oriented.  Eyes: Conjunctivae are normal.  Head: Atraumatic. Nose: No congestion/rhinnorhea. Mouth/Throat: Mucous membranes are moist.   Neck: No stridor. Painless ROM.  Cardiovascular: Normal rate, regular rhythm. Grossly normal heart sounds.  Good peripheral circulation. Respiratory: Normal  respiratory effort.  No retractions. Lungs CTAB. Gastrointestinal: Soft and nontender. No distention. No abdominal bruits. No CVA tenderness. Genitourinary: deferred Musculoskeletal: No lower extremity tenderness nor edema.  No joint effusions. Neurologic:  Normal speech and language. No gross focal neurologic deficits are appreciated. No facial droop Skin:  Skin is warm, dry and intact. No rash noted. Psychiatric: Mood and affect are normal. Speech and behavior are normal.  ____________________________________________   LABS (all labs ordered are listed, but only abnormal results are displayed)  Results for orders placed or performed during the hospital encounter of 08/11/17 (from the past 24 hour(s))  Lipase, blood     Status: None   Collection Time: 08/11/17  4:46 PM  Result Value Ref Range   Lipase 26 11 - 51 U/L  Comprehensive metabolic panel     Status: Abnormal   Collection Time: 08/11/17  4:46 PM  Result Value Ref Range   Sodium 133 (L) 135 - 145 mmol/L   Potassium 3.9 3.5 -  5.1 mmol/L   Chloride 101 101 - 111 mmol/L   CO2 24 22 - 32 mmol/L   Glucose, Bld 131 (H) 65 - 99 mg/dL   BUN 8 6 - 20 mg/dL   Creatinine, Ser 0.50 0.44 - 1.00 mg/dL   Calcium 9.4 8.9 - 10.3 mg/dL   Total Protein 7.5 6.5 - 8.1 g/dL   Albumin 3.8 3.5 - 5.0 g/dL   AST 20 15 - 41 U/L   ALT 13 (L) 14 - 54 U/L   Alkaline Phosphatase 50 38 - 126 U/L   Total Bilirubin 0.7 0.3 - 1.2 mg/dL   GFR calc non Af Amer >60 >60 mL/min   GFR calc Af Amer >60 >60 mL/min   Anion gap 8 5 - 15  CBC     Status: Abnormal   Collection Time: 08/11/17  4:46 PM  Result Value Ref Range   WBC 11.2 (H) 3.6 - 11.0 K/uL   RBC 4.30 3.80 - 5.20 MIL/uL   Hemoglobin 12.8 12.0 - 16.0 g/dL   HCT 37.9 35.0 - 47.0 %   MCV 88.1 80.0 - 100.0 fL   MCH 29.8 26.0 - 34.0 pg   MCHC 33.8 32.0 - 36.0 g/dL   RDW 13.3 11.5 - 14.5 %   Platelets 396 150 - 440 K/uL  Urinalysis, Complete w Microscopic     Status: Abnormal   Collection Time: 08/11/17  9:33 PM  Result Value Ref Range   Color, Urine YELLOW (A) YELLOW   APPearance CLEAR (A) CLEAR   Specific Gravity, Urine >1.046 (H) 1.005 - 1.030   pH 6.0 5.0 - 8.0   Glucose, UA NEGATIVE NEGATIVE mg/dL   Hgb urine dipstick SMALL (A) NEGATIVE   Bilirubin Urine NEGATIVE NEGATIVE   Ketones, ur NEGATIVE NEGATIVE mg/dL   Protein, ur NEGATIVE NEGATIVE mg/dL   Nitrite NEGATIVE NEGATIVE   Leukocytes, UA TRACE (A) NEGATIVE   RBC / HPF 0-5 0 - 5 RBC/hpf   WBC, UA 6-10 0 - 5 WBC/hpf   Bacteria, UA NONE SEEN NONE SEEN   Squamous Epithelial / LPF 0-5 0 - 5   ____________________________________________  EKG My review and personal interpretation at Time: 17:05   Indication: weakness, N/v  Rate: 90  Rhythm: sinus Axis: normal Other: normal intervals, no stemi ____________________________________________  RADIOLOGY  I personally reviewed all radiographic images ordered to evaluate for the above acute complaints and reviewed radiology reports and findings.  These findings were  personally discussed with  the patient.  Please see medical record for radiology report.  ____________________________________________   PROCEDURES  Procedure(s) performed:  Procedures    Critical Care performed: no ____________________________________________   INITIAL IMPRESSION / ASSESSMENT AND PLAN / ED COURSE  Pertinent labs & imaging results that were available during my care of the patient were reviewed by me and considered in my medical decision making (see chart for details).   DDX: dehydration, sbo, mass, colitis, perf, uti, electrolyte metabolic abn  Norma Johns is a 77 y.o. who presents to the ED with symptoms as described above.  Presentation is comp located by patient's known metastatic breast cancer with carcinomatosis.  Her abdominal exam does have some generalized tenderness.  CT imaging ordered to evaluate for obstructive pattern worsening malignancy or infectious process.  CT imaging fortunately shows known carcinomatosis without any obstructive pathology.  No evidence of inflammatory changes.  Will provide IV fluids as well as IV pain medication and IV Zofran.  May simply be dehydration.  Clinical Course as of Aug 12 2311  Sun Aug 11, 2017  1916 Patient reassessed and feels much improved after IV fluids and IV pain medication she is tolerating oral hydration.   [PR]  2043 Patient tolerating oral hydration.  Discussed option for additional IV fluids versus pushing oral fluids and follow-up with PCP tomorrow for recheck.  At this point I do not believe that additional diagnostic testing clinically indicated.   [PR]  2215 Patient reassessed.  Repeat abdominal exam is soft and benign.  Discussed options of admission the hospital for additional IV fluids and symptom medic management patient is currently tolerating oral hydration is drink several cups of fluid without any emesis.  Able to ambulate with steady gait.  Blood work is otherwise reassuring.  At this point do  believe she stable and appropriate for trial of outpatient management.  Will provide prescription for additional antiemetic.  Have discussed with the patient and available family all diagnostics and treatments performed thus far and all questions were answered to the best of my ability. The patient demonstrates understanding and agreement with plan.    [PR]    Clinical Course User Index [PR] Merlyn Lot, MD     As part of my medical decision making, I reviewed the following data within the [facility] notes reviewed and incorporated, Labs reviewed, notes from prior ED visits and [place] Controlled Substance Database   ____________________________________________   FINAL CLINICAL IMPRESSION(S) / ED DIAGNOSES  Final diagnoses:  Non-intractable vomiting with nausea, unspecified vomiting type  Dehydration      NEW MEDICATIONS STARTED DURING THIS VISIT:  New Prescriptions   ONDANSETRON (ZOFRAN ODT) 4 MG DISINTEGRATING TABLET    Take 1 tablet (4 mg total) by mouth every 8 (eight) hours as needed for nausea or vomiting.     Note:  This document was prepared using Dragon voice recognition software and may include unintentional dictation errors.    Merlyn Lot, MD 08/11/17 [phone]

## 2017-08-12 ENCOUNTER — Ambulatory Visit: Payer: Medicare Other | Admitting: Oncology

## 2017-08-12 ENCOUNTER — Telehealth: Payer: Self-pay | Admitting: *Deleted

## 2017-08-12 ENCOUNTER — Ambulatory Visit: Payer: Medicare Other

## 2017-08-12 ENCOUNTER — Other Ambulatory Visit: Payer: Self-pay

## 2017-08-12 ENCOUNTER — Inpatient Hospital Stay (HOSPITAL_BASED_OUTPATIENT_CLINIC_OR_DEPARTMENT_OTHER): Payer: Medicare Other | Admitting: Oncology

## 2017-08-12 ENCOUNTER — Other Ambulatory Visit: Payer: Self-pay | Admitting: *Deleted

## 2017-08-12 ENCOUNTER — Encounter: Payer: Self-pay | Admitting: Oncology

## 2017-08-12 ENCOUNTER — Other Ambulatory Visit: Payer: Medicare Other

## 2017-08-12 ENCOUNTER — Inpatient Hospital Stay: Payer: Medicare Other

## 2017-08-12 VITALS — BP 126/75 | HR 95 | Temp 99.3°F | Resp 16 | Ht 63.0 in | Wt 170.2 lb

## 2017-08-12 DIAGNOSIS — F418 Other specified anxiety disorders: Secondary | ICD-10-CM

## 2017-08-12 DIAGNOSIS — G4730 Sleep apnea, unspecified: Secondary | ICD-10-CM | POA: Diagnosis not present

## 2017-08-12 DIAGNOSIS — E860 Dehydration: Secondary | ICD-10-CM | POA: Diagnosis not present

## 2017-08-12 DIAGNOSIS — K5900 Constipation, unspecified: Secondary | ICD-10-CM

## 2017-08-12 DIAGNOSIS — C801 Malignant (primary) neoplasm, unspecified: Secondary | ICD-10-CM

## 2017-08-12 DIAGNOSIS — C50512 Malignant neoplasm of lower-outer quadrant of left female breast: Secondary | ICD-10-CM | POA: Diagnosis not present

## 2017-08-12 DIAGNOSIS — R531 Weakness: Secondary | ICD-10-CM | POA: Diagnosis not present

## 2017-08-12 DIAGNOSIS — Z170 Estrogen receptor positive status [ER+]: Secondary | ICD-10-CM

## 2017-08-12 DIAGNOSIS — J9621 Acute and chronic respiratory failure with hypoxia: Secondary | ICD-10-CM | POA: Diagnosis not present

## 2017-08-12 DIAGNOSIS — C762 Malignant neoplasm of abdomen: Secondary | ICD-10-CM

## 2017-08-12 DIAGNOSIS — Z79818 Long term (current) use of other agents affecting estrogen receptors and estrogen levels: Secondary | ICD-10-CM | POA: Diagnosis not present

## 2017-08-12 DIAGNOSIS — K219 Gastro-esophageal reflux disease without esophagitis: Secondary | ICD-10-CM | POA: Diagnosis not present

## 2017-08-12 DIAGNOSIS — Z8673 Personal history of transient ischemic attack (TIA), and cerebral infarction without residual deficits: Secondary | ICD-10-CM

## 2017-08-12 DIAGNOSIS — R112 Nausea with vomiting, unspecified: Secondary | ICD-10-CM | POA: Diagnosis not present

## 2017-08-12 DIAGNOSIS — E785 Hyperlipidemia, unspecified: Secondary | ICD-10-CM | POA: Diagnosis not present

## 2017-08-12 DIAGNOSIS — J9601 Acute respiratory failure with hypoxia: Secondary | ICD-10-CM | POA: Diagnosis not present

## 2017-08-12 DIAGNOSIS — C786 Secondary malignant neoplasm of retroperitoneum and peritoneum: Secondary | ICD-10-CM

## 2017-08-12 DIAGNOSIS — Z79811 Long term (current) use of aromatase inhibitors: Secondary | ICD-10-CM

## 2017-08-12 DIAGNOSIS — R1033 Periumbilical pain: Secondary | ICD-10-CM

## 2017-08-12 DIAGNOSIS — G893 Neoplasm related pain (acute) (chronic): Secondary | ICD-10-CM | POA: Diagnosis not present

## 2017-08-12 DIAGNOSIS — Z9012 Acquired absence of left breast and nipple: Secondary | ICD-10-CM | POA: Diagnosis not present

## 2017-08-12 DIAGNOSIS — Z8042 Family history of malignant neoplasm of prostate: Secondary | ICD-10-CM

## 2017-08-12 MED ORDER — FENTANYL 25 MCG/HR TD PT72: 25.0000 ug | MEDICATED_PATCH | TRANSDERMAL | 0 refills | Status: DC

## 2017-08-12 MED ORDER — PROCHLORPERAZINE MALEATE 10 MG PO TABS: 10.0000 mg | ORAL_TABLET | Freq: Four times a day (QID) | ORAL | 0 refills | Status: DC | PRN

## 2017-08-12 NOTE — Progress Notes (Signed)
Hematology/Oncology Follow Up Note [facility] Telephone:([phone] [phone]   Patient Care Team: Rubye Beach as PCP - General (Family Medicine) Bary Castilla, Forest Gleason, MD (General Surgery) Earlie Server, MD as Consulting Physician (Oncology)  REASON FOR VISIT Follow up for treatment of breast cancer.  HISTORY OF PRESENTING ILLNESS/PERTINENT ONCOLOGY HISTORY Patient was last seen by primary medical oncologist Dr. Tasia Catchings on 07/31/2017 where she presented for follow-up of recurrent breast cancer with mets to stomach (peritoneum carcinomatosis) with recent ultrasound of abdomen for possible paracentesis which was not completed due to small volume.  Had CT biopsy scheduled but patient canceled.  Complained of  neoplasm related pain with bloating located at left lower chest and diffuse abdominal pain.  Recent bone scan did not reveal metastatic bone disease.  She was started on oxycodone 5 mg every 6 hours.  Spoke at length about the importance of getting biopsy completed.  She was taken off of her Arimidex and started on Faslodex.  She was started on a bowel regimen with Senokot..  In the interim she was seen in the emergency room on 08/11/2017 for emesis and generalized body aches.  CT imaging not revealing any obstructive pathology.  No evidence of inflammatory changes.  Was given IV fluids, IV pain medicine and antiemetics.  She was discharged home.  Oncology History   Patient went for routine mammogram screening in May. Abnormalities was found on the left breast. Patient was called back to have diagnostic mammogram done on 08/08/2016 which showed left breast mass at 4:00 3 cm from nipple and a 4:00 6 cm of the nipple. Indeterminate left axillary lymph nodes.  Patient underwent biopsy on 08/15/2016. Pathology showed invasive lobular carcinoma for both breast lesion, . DCIS present, lymph node biopsy is positive for metastatic lobular carcinoma  MRI of the  breast on 09/10/2016 showed the having small no mass area of progressive enhancement, left breast 2 biopsy proven malignancy seen in the left breast were separated by 3.4 cm band of weakly enhancing tissue. An area of stippled discontinuous no masses enhancement extends superiorly in the left breast in 2 the upper outer quadrant spanning approximately 3.2 cm anterior/superior to the more anterior of the 2 biopsy proven malignancy. Recommend MRI guided core biopsy of the treated area of non-mass enhancement. Patient was seen and evaluated by Dr. Tollie Pizza and position was left modified radical mastectomy, right simple mastectomy with sentinel node biopsy. Patient underwent surgery on 10/01/2016. SHe tolerated the procedure well.  She declined systemic chemotherapy or radiation therapy. She was started on Arimidex '1mg'$  daily since September. Denies any hotflush or joint pain.  Her wound has healed well and she follows up with surgeon for seroma. She does not complaints about pain. She noticed a small area of fluid collection.       Metastatic breast cancer ([place])   07/25/2017 Initial Diagnosis    Metastatic breast cancer ([place])      07/30/2017 -  Chemotherapy    The patient had fulvestrant (FASLODEX) injection 500 mg, 500 mg, Intramuscular,  Once, 1 of 4 cycles Administration: 500 mg (07/31/2017)  for chemotherapy treatment.        INTERVAL HISTORY  Today she presents for follow-up and lab draw (CEA, Ca 125 and CA 19-9).   Today patient complains of worsening abdominal pain, nausea, vomiting and weakness.  Was seen yesterday in the emergency room and received 1 L NaCl, IV pain medicine and antiemetics.  Was given a prescription for  Zofran.  Pain in her abdomen is diffuse beginning the epigastric area and has been slowly worsening.  Unable to take pain medication due to nausea/vomiting. had a bowel movement in the past 24 hours.  Has very little appetite.  Has been trying to drink 3-4 boost/ensures daily.   States "cannot drink anymore of those".  Denies any new  or suspicious foods triggered.   ROS:  Review of Systems  Constitutional: Positive for appetite change and fatigue. Negative for fever and unexpected weight change.  HENT:   Negative for nosebleeds, sore throat and trouble swallowing.   Eyes: Negative.   Respiratory: Negative.  Negative for cough, shortness of breath and wheezing.   Cardiovascular: Negative.  Negative for chest pain and leg swelling.  Gastrointestinal: Positive for abdominal pain, nausea and vomiting. Negative for abdominal distention, blood in stool, constipation and diarrhea.  Endocrine: Negative.   Genitourinary: Positive for pelvic pain. Negative for bladder incontinence, dysuria, frequency, hematuria and nocturia.   Musculoskeletal: Negative.  Negative for back pain and flank pain.  Skin: Negative.   Neurological: Negative.  Negative for dizziness, headaches, light-headedness and numbness.  Hematological: Negative.   Psychiatric/Behavioral: Negative.  Negative for confusion. The patient is not nervous/anxious.     MEDICAL HISTORY:  Past Medical History:  Diagnosis Date  . Anemia   . Anxiety   . Arthritis   . Asthma    WELL CONTROLLED  . Breast cancer ([place]) 2018   left breast cnacer  . Cancer ([place])    Bilat Mastectomy  . Depression   . GERD (gastroesophageal reflux disease)   . Headache    H/O  . Hyperlipidemia   . Metastatic breast cancer ([place]) 07/25/2017  . Metastatic breast cancer ([place]) 07/25/2017  . Sleep apnea    DOES NOT USE CPAP-COULD NOT TOLERATE  . Stroke [facility]) 2004    SURGICAL HISTORY: Past Surgical History:  Procedure Laterality Date  . ABDOMINAL HYSTERECTOMY    . BLADDER REPAIR    . BREAST BIOPSY Right 03/23/1991   Fibroadenoma with intramammary lymph node. Right breast, 9:00.  Marland Kitchen CHOLECYSTECTOMY    . COLONOSCOPY  2005  . MASTECTOMY MODIFIED RADICAL Left 10/01/2016   Procedure: MASTECTOMY MODIFIED RADICAL;  Surgeon: Robert Bellow, MD;  Location: [facility] ORS;  Service: General;  Laterality: Left;  . MR MRA CAROTID  02/2010   Minimal plaque formation; no significant stenosis. Sx: Syncope  . [facility]  . SENTINEL NODE BIOPSY Right 10/01/2016   Procedure: SENTINEL NODE BIOPSY;  Surgeon: Robert Bellow, MD;  Location: [facility] ORS;  Service: General;  Laterality: Right;  . SIMPLE MASTECTOMY WITH AXILLARY SENTINEL NODE BIOPSY Right 10/01/2016   Procedure: SIMPLE MASTECTOMY;  Surgeon: Robert Bellow, MD;  Location: [facility] ORS;  Service: General;  Laterality: Right;    SOCIAL HISTORY: Social History   Socioeconomic History  . Marital status: Married    Spouse name: Not on file  . Number of children: 2  . Years of education: Not on file  . Highest education level: 12th grade  Occupational History  . Occupation: retired  Scientific laboratory technician  . Financial resource strain: Very hard  . Food insecurity:    Worry: Never true    Inability: Never true  . Transportation needs:    Medical: No    Non-medical: No  Tobacco Use  . Smoking status: Never Smoker  . Smokeless tobacco: Never Used  Substance and Sexual Activity  .  Alcohol use: No  . Drug use: No  . Sexual activity: Never  Lifestyle  . Physical activity:    Days per week: Not on file    Minutes per session: Not on file  . Stress: Very much  Relationships  . Social connections:    Talks on phone: Not on file    Gets together: Not on file    Attends religious service: Not on file    Active member of club or organization: Not on file    Attends meetings of clubs or organizations: Not on file    Relationship status: Not on file  . Intimate partner violence:    Fear of current or ex partner: No    Emotionally abused: No    Physically abused: No    Forced sexual activity: No  Other Topics Concern  . Not on file  Social History Narrative  . Not on file    FAMILY HISTORY: Family History  Problem Relation Age of Onset  .  Cancer Father   . Prostate cancer Father   . Heart attack Mother   . Hypertension Mother   . CAD Mother   . Hypertension Son   . Diabetes Son   . Lung cancer Brother   . Leukemia Brother   . Lung cancer Brother   . Prostate cancer Brother   . Breast cancer Neg Hx     ALLERGIES:  is allergic to chocolate; other; sulfa antibiotics; atorvastatin; minocycline hcl; naproxen; niacin; septra [sulfamethoxazole-trimethoprim]; vioxx [rofecoxib]; and penicillins.  MEDICATIONS:  Current Outpatient Medications  Medication Sig Dispense Refill  . albuterol (PROVENTIL HFA;VENTOLIN HFA) 108 (90 Base) MCG/ACT inhaler Inhale 2 puffs into the lungs every 4 (four) hours as needed for wheezing or shortness of breath. 1 Inhaler 11  . ALPRAZolam (XANAX) 0.5 MG tablet Take 1 tablet (0.5 mg total) by mouth at bedtime as needed for anxiety. 30 tablet 5  . fexofenadine (ALLEGRA) 180 MG tablet Take 180 mg by mouth daily. PRN    . fluticasone (FLONASE) 50 MCG/ACT nasal spray Place 2 sprays into both nostrils daily. 16 g 5  . Multiple Vitamin (MULTIVITAMIN) capsule Take 1 capsule by mouth daily.    . ondansetron (ZOFRAN ODT) 4 MG disintegrating tablet Take 1 tablet (4 mg total) by mouth every 8 (eight) hours as needed for nausea or vomiting. 20 tablet 0  . oxyCODONE (OXY IR/ROXICODONE) 5 MG immediate release tablet Take 1 tablet (5 mg total) by mouth every 4 (four) hours as needed for severe pain. 60 tablet 0  . oxyCODONE ER (XTAMPZA ER) 9 MG C12A Take 9 mg by mouth every 12 (twelve) hours. 28 each 0  . pantoprazole (PROTONIX) 40 MG tablet Take 1 tablet (40 mg total) by mouth 2 (two) times daily before a meal. 180 tablet 1  . senna-docusate (SENNA S) 8.6-50 MG tablet Take 2 tablets by mouth daily. 60 tablet 3  . triamcinolone cream (KENALOG) 0.1 % Apply 1 application topically 2 (two) times daily. 30 g 0  . vitamin B-12 (CYANOCOBALAMIN) 500 MCG tablet Take 500 mcg by mouth daily.      No current  facility-administered medications for this visit.       Marland Kitchen  PHYSICAL EXAMINATION: ECOG PERFORMANCE STATUS: 0 - Asymptomatic Vitals:   08/12/17 1416 08/12/17 1424  BP:  126/75  Pulse:  95  Resp: 16   Temp:  99.3 F (37.4 C)   Filed Weights   08/12/17 1416  Weight: 170 lb 3.2  oz (77.2 kg)   Physical Exam  Constitutional: She is oriented to person, place, and time and well-developed, well-nourished, and in no distress. Vital signs are normal.  HENT:  Head: Normocephalic and atraumatic.  Eyes: Pupils are equal, round, and reactive to light.  Neck: Normal range of motion.  Cardiovascular: Normal rate, regular rhythm and normal heart sounds.  No murmur heard. Pulmonary/Chest: Effort normal and breath sounds normal. She has no wheezes.  Abdominal: Soft. Normal appearance and bowel sounds are normal. She exhibits distension. There is tenderness.  Musculoskeletal: Normal range of motion. She exhibits no edema.  Neurological: She is alert and oriented to person, place, and time. Gait normal.  Skin: Skin is warm and dry. No rash noted.  Psychiatric: Mood, memory, affect and judgment normal.    LABORATORY DATA:  I have reviewed the data as listed Lab Results  Component Value Date   WBC 11.2 (H) 08/11/2017   HGB 12.8 08/11/2017   HCT 37.9 08/11/2017   MCV 88.1 08/11/2017   PLT 396 08/11/2017   Recent Labs    07/30/17 1526 07/31/17 1406 08/11/17 1646  NA 136 136 133*  K 3.5 4.1 3.9  CL 104 103 101  CO2 '24 26 24  '$ GLUCOSE 134* 140* 131*  BUN '10 12 8  '$ CREATININE 0.67 0.77 0.50  CALCIUM 10.0 10.0 9.4  GFRNONAA >60 >60 >60  GFRAA >60 >60 >60  PROT 7.0 6.9 7.5  ALBUMIN 3.6 3.5 3.8  AST 20 49* 20  ALT 13* 44 13*  ALKPHOS 49 69 50  BILITOT 0.5 0.8 0.7    Pathology 10/01/2016 SPECIMEN SUBMITTED:  A. Breast, left, and axillary contents  B. Breast, right; simple mastectomy  C. Sentinel node 1, right  DIAGNOSIS:  A. LEFT BREAST AND AXILLARY CONTENTS; MODIFIED RADICAL  MASTECTOMY:  - INVASIVE CARCINOMA WITH DUCTAL AND LOBULAR FEATURES.  - LARGEST FOCUS OF INVASIVE CARCINOMA MEASURES 10 MM.  - NINETEEN OF NINETEEN LYMPH NODES POSITIVE FOR METASTASIS (19/19).  - BIOPSY SITE CHANGES, MARKER CLIP PRESENT.  - SURGICAL MARGINS ARE NEGATIVE.  B. RIGHT BREAST; SIMPLE MASTECTOMY:  - FIBROCYSTIC CHANGE.  - NEGATIVE FOR ATYPIA AND MALIGNANCY.  C. SENTINEL LYMPH NODE 1, RIGHT; EXCISION:  - NO TUMOR SEEN IN ONE LYMPH NODE (0/1).  Surgical Pathology Cancer Case Summary   INVASIVE CARCINOMA OF THEBREAST  Procedure: modified radical mastectomy  Specimen Laterality: left  Histologic Type: invasive carcinoma with ductal and lobular features  Histologic Grade (Nottingham Histologic Score)       Glandular (Acinar)/Tubular Differentiation: 3       Nuclear Pleomorphism: 1       Mitotic Rate: 1       Overall Grade: 1  Tumor Size: 10 mm  Ductal Carcinoma in Situ (DCIS): present       Nuclear Grade: 1       Extensive intraductal component: negative  Lymphovascular Invasion: present  Treatment Effect: not applicable  Margins:       Invasive carcinoma: margins negative       Distance from closest margin: 20 mm from deep margin       DCIS: margins negative         Distance from closest margin: 20 mm from deep margin  Regional Lymph nodes:    Total # lymph nodes examined: 19    # Sentinel lymph nodes examined: 0    # Lymph nodes with macrometastasis (>2.0 mm): 18    # Lymph nodes with isolated tumor cells (<  0.32m): 0    # Lymph nodes with micrometastasis (> 0.2 mm and < 2.0 mm): 1    Extranodal extension: focally present  Pathologic Stage Classification (pTNM, AJCC 7th Edition)    TNM Descriptors: m (multiple foci of invasive carcinoma)    pTNM: mpT1b pN3a   Note: Two masses are identified. The 3rd grossly described nodule in the  upper outer quadrant is an intramammary node  nearly replaced by tumor.  Biomarker testing was performed on a separate specimen and in summary:  Estrogen Receptor (ER) Status: POSITIVE, >90%    Average intensity of staining: Strong  Progesterone Receptor (PgR) Status: POSITIVE, >90%    Average intensity of staining: Moderate  HER2 (by immunohistochemistry): NEGATIVE (Score 1+)    Pathology 10/01/2016 SPECIMEN SUBMITTED:  A. Breast, left, 4:00, 3 CMFN, biopsy  B. Breast, left, 4:00, 6 CMFN, biopsy  C. Lymph node, left axilla, biopsy  DIAGNOSIS:  A. BREAST, LEFT, 4 O'CLOCK 3 CM FROM NIPPLE; ULTRASOUND-GUIDED CORE  BIOPSY:  - INVASIVE LOBULAR CARCINOMA, CLASSIC TYPE.  Size of invasive carcinoma: 6 mm in this sample  Histologic grade of invasive carcinoma: Grade 1    Glandular/tubular differentiation score: 3    Nuclear pleomorphism score: 1    Mitotic rate score: 1    Total score: 5  Ductal carcinomain situ: Present, low grade  Lymphovascular invasion: Not identified  B. BREAST, LEFT, 4 O'CLOCK 6 CM FROM NIPPLE; ULTRASOUND-GUIDED CORE  BIOPSY:  - INVASIVE LOBULAR CARCINOMA, CLASSIC TYPE.  Size of invasive carcinoma: 6 mm in this sample  Histologic grade of invasive carcinoma: Grade 1    Glandular/tubular differentiation score: 3    Nuclear pleomorphism score: 1    Mitotic rate score: 1    Total score: 5  Ductal carcinoma in situ: Not identified  Lymphovascular invasion: Not identified  C. LYMPH NODE, LEFT AXILLA; ULTRASOUND-GUIDED CORE BIOPSY:  - METASTATIC LOBULAR CARCINOMA; TUMOR SPANS 7 MM.  - NO EXTRANODAL EXTENSION IN THIS SAMPLE.   ASSESSMENT & PLAN:  Patient arrives in wheelchair with her daughter.  She appears to be in pain.  On examination, patient tender in epigastric left lower quadrant extending along her pelvis.  No rebound tenderness.  Has been unable to keep pain medication down due to severe nausea and episodes of vomiting.  No labs were collected today except for tumor  markers. Labs were collected yesterday in the emergency room.  Dehydration: She was found to be dehydrated and was given 1 L NaCl.  Sodium level was 133.  Today BP  And HR are stable.   Worsening abdominal pain: CT scan was performed to rule out any new findings and it was negative for inflammatory process or obstruction.  Pain is likely due to known abdominal metastasis.  Belly appears distended, and after speaking with Dr. YTasia Catchingswill arrange for ultrasound guided paracentesis if needed.   Nausea/Vomiting: She was given a prescription for Zofran she appears to be helping.  We will add Compazine.  She has been unable to keep pain medications down due to nausea/vomiting.  We will try fentanyl patch 25 mcg/h to see if this can help.  Patient knows this is a long-acting pain medication and she may take her oxycodone if she needs for breakthrough pain.  Poor venous access: Patient states she has to be stuck several times to get labs.  Patient is interested in port placement.  Spoke with Dr. YTasia Catchingsand once known origin of abdominal metastasis is complete, patient will likely  receive systemic chemotherapy and will need a Port-A-Cath.  We will get this scheduled for her ASAP.  Patient would prefer Dr. Marlou Starks if possible.  Cancer Staging Malignant neoplasm of lower-outer quadrant of left breast of female, estrogen receptor positive ([place]) Staging form: Breast, AJCC 8th Edition - Clinical stage from 10/22/2016: Stage IIIB (cT1b(m), cN3a, cM0, G1, ER: Positive, PR: Positive, HER2: Negative) - Signed by Earlie Server, MD on 10/22/2016 - Pathologic stage from 10/22/2016: pT1b(m), pN3a, cM0, ER: Positive, PR: Positive, HER2: Negative - Signed by Earlie Server, MD on 10/22/2016  No diagnosis found.  Greater than 50% was spent in counseling and coordination of care with this patient including but not limited to discussion of the relevant topics above (See A&P) including, but not limited to diagnosis and management of acute and chronic  medical conditions.   Faythe Casa, NP 08/13/2017 10:08 AM

## 2017-08-12 NOTE — Progress Notes (Signed)
Patient here for symptom management. She reports feeling nausous for the past week and had several bouts of emesis yesterday. None today. Her lower abdomen is very painful. She has been unable to take pain medications because she is not eating.

## 2017-08-12 NOTE — Telephone Encounter (Signed)
Dr Tasia Catchings has no available appts today, you can offer [facility] if she feels she needs to be seen.  Dr Tasia Catchings would like for patient to come in for some labs today if she is willing to.

## 2017-08-12 NOTE — Telephone Encounter (Signed)
Patient went to ER yesterday with N/V dehydration, Asking if she needs to be seen today in clinic. She is not vomiting any longer and is drinking well, but unable to eat food. She does have nausea, but the oral prescription for nausea  medicine given by ER physician is helping that. She has an appointment Wed but asking if she can be seen by Dr Tasia Catchings today. Please advise

## 2017-08-12 NOTE — Telephone Encounter (Signed)
Patient accepts 2 pm lab appointment followed by seeing SCM NP this afternoon.

## 2017-08-13 ENCOUNTER — Telehealth: Payer: Self-pay | Admitting: *Deleted

## 2017-08-13 ENCOUNTER — Other Ambulatory Visit: Payer: Self-pay | Admitting: Oncology

## 2017-08-13 DIAGNOSIS — C762 Malignant neoplasm of abdomen: Secondary | ICD-10-CM

## 2017-08-13 LAB — CA 125: CANCER ANTIGEN (CA) 125: 91.9 U/mL — AB (ref 0.0–38.1)

## 2017-08-13 LAB — CANCER ANTIGEN 19-9: CA 19-9: 36 U/mL — ABNORMAL HIGH (ref 0–35)

## 2017-08-13 LAB — CEA: CEA1: 40 ng/mL — AB (ref 0.0–4.7)

## 2017-08-13 NOTE — Telephone Encounter (Signed)
Called patient to let her know that appointment was scheduled with Dr. Marlou Starks, at [facility] on June 24th @ 2:20 to consult for port a cath placement. Patient verbalized understanding.      dhs

## 2017-08-13 NOTE — Telephone Encounter (Signed)
Called patient to inform her of appointment for paracentesis, which is scheduled for this Friday, June 22 @ 2:30 pm, at [facility]. Patient is to arrive at 2:00 pm to medical mall. Patient verbalized understanding.     dhs

## 2017-08-13 NOTE — Discharge Instructions (Signed)
Paracentesis, Care After °Refer to this sheet in the next few weeks. These instructions provide you with information about caring for yourself after your procedure. Your health care provider may also give you more specific instructions. Your treatment has been planned according to current medical practices, but problems sometimes occur. Call your health care provider if you have any problems or questions after your procedure. °What can I expect after the procedure? °After your procedure, it is common to have a small amount of clear fluid coming from the puncture site. °Follow these instructions at home: °· Return to your normal activities as told by your health care provider. Ask your health care provider what activities are safe for you. °· Take over-the-counter and prescription medicines only as told by your health care provider. °· Do not take baths, swim, or use a hot tub until your health care provider approves. °· Follow instructions from your health care provider about: °? How to take care of your puncture site. °? When and how you should change your bandage (dressing). °? When you should remove your dressing. °· Check your puncture area every day signs of infection. Watch for: °? Redness, swelling, or pain. °? Fluid, blood, or pus. °· Keep all follow-up visits as told by your health care provider. This is important. °Contact a health care provider if: °· You have redness, swelling, or pain at your puncture site. °· You start to have more clear fluid coming from your puncture site. °· You have blood or pus coming from your puncture site. °· You have chills. °· You have a fever. °Get help right away if: °· You develop chest pain or shortness of breath. °· You develop increasing pain, discomfort, or swelling in your abdomen. °· You feel dizzy or light-headed or you pass out. °This information is not intended to replace advice given to you by your health care provider. Make sure you discuss any questions you  have with your health care provider. °Document Released: 06/29/2014 Document Revised: 07/21/2015 Document Reviewed: 04/27/2014 °Elsevier Interactive Patient Education © 2018 [employer] ° °

## 2017-08-14 ENCOUNTER — Inpatient Hospital Stay: Payer: Medicare Other

## 2017-08-14 ENCOUNTER — Other Ambulatory Visit: Payer: Self-pay

## 2017-08-14 ENCOUNTER — Encounter: Payer: Self-pay | Admitting: Oncology

## 2017-08-14 ENCOUNTER — Inpatient Hospital Stay (HOSPITAL_BASED_OUTPATIENT_CLINIC_OR_DEPARTMENT_OTHER): Payer: Medicare Other | Admitting: Oncology

## 2017-08-14 VITALS — BP 134/81 | HR 81 | Temp 98.5°F | Resp 18 | Wt 170.7 lb

## 2017-08-14 DIAGNOSIS — Z79811 Long term (current) use of aromatase inhibitors: Secondary | ICD-10-CM

## 2017-08-14 DIAGNOSIS — F418 Other specified anxiety disorders: Secondary | ICD-10-CM

## 2017-08-14 DIAGNOSIS — C786 Secondary malignant neoplasm of retroperitoneum and peritoneum: Secondary | ICD-10-CM | POA: Diagnosis not present

## 2017-08-14 DIAGNOSIS — C50512 Malignant neoplasm of lower-outer quadrant of left female breast: Secondary | ICD-10-CM

## 2017-08-14 DIAGNOSIS — G893 Neoplasm related pain (acute) (chronic): Secondary | ICD-10-CM | POA: Diagnosis not present

## 2017-08-14 DIAGNOSIS — R531 Weakness: Secondary | ICD-10-CM | POA: Diagnosis not present

## 2017-08-14 DIAGNOSIS — K219 Gastro-esophageal reflux disease without esophagitis: Secondary | ICD-10-CM

## 2017-08-14 DIAGNOSIS — G4730 Sleep apnea, unspecified: Secondary | ICD-10-CM | POA: Diagnosis not present

## 2017-08-14 DIAGNOSIS — C801 Malignant (primary) neoplasm, unspecified: Principal | ICD-10-CM

## 2017-08-14 DIAGNOSIS — Z9012 Acquired absence of left breast and nipple: Secondary | ICD-10-CM

## 2017-08-14 DIAGNOSIS — E785 Hyperlipidemia, unspecified: Secondary | ICD-10-CM | POA: Diagnosis not present

## 2017-08-14 DIAGNOSIS — R112 Nausea with vomiting, unspecified: Secondary | ICD-10-CM | POA: Diagnosis not present

## 2017-08-14 DIAGNOSIS — C50919 Malignant neoplasm of unspecified site of unspecified female breast: Secondary | ICD-10-CM

## 2017-08-14 DIAGNOSIS — Z170 Estrogen receptor positive status [ER+]: Secondary | ICD-10-CM

## 2017-08-14 DIAGNOSIS — E860 Dehydration: Secondary | ICD-10-CM | POA: Diagnosis not present

## 2017-08-14 DIAGNOSIS — Z7189 Other specified counseling: Secondary | ICD-10-CM

## 2017-08-14 DIAGNOSIS — K5900 Constipation, unspecified: Secondary | ICD-10-CM

## 2017-08-14 DIAGNOSIS — Z79818 Long term (current) use of other agents affecting estrogen receptors and estrogen levels: Secondary | ICD-10-CM

## 2017-08-14 DIAGNOSIS — D0502 Lobular carcinoma in situ of left breast: Secondary | ICD-10-CM

## 2017-08-14 DIAGNOSIS — Z8673 Personal history of transient ischemic attack (TIA), and cerebral infarction without residual deficits: Secondary | ICD-10-CM | POA: Diagnosis not present

## 2017-08-14 DIAGNOSIS — Z8042 Family history of malignant neoplasm of prostate: Secondary | ICD-10-CM

## 2017-08-14 DIAGNOSIS — R110 Nausea: Secondary | ICD-10-CM

## 2017-08-14 LAB — CBC WITH DIFFERENTIAL/PLATELET
Basophils Absolute: 0.1 10*3/uL (ref 0–0.1)
Basophils Relative: 1 %
EOS PCT: 3 %
Eosinophils Absolute: 0.3 10*3/uL (ref 0–0.7)
HCT: 35.2 % (ref 35.0–47.0)
Hemoglobin: 11.9 g/dL — ABNORMAL LOW (ref 12.0–16.0)
Lymphocytes Relative: 13 %
Lymphs Abs: 1.4 10*3/uL (ref 1.0–3.6)
MCH: 30.1 pg (ref 26.0–34.0)
MCHC: 33.7 g/dL (ref 32.0–36.0)
MCV: 89.3 fL (ref 80.0–100.0)
MONO ABS: 0.9 10*3/uL (ref 0.2–0.9)
MONOS PCT: 9 %
NEUTROS ABS: 8 10*3/uL — AB (ref 1.4–6.5)
Neutrophils Relative %: 74 %
PLATELETS: 407 10*3/uL (ref 150–440)
RBC: 3.94 MIL/uL (ref 3.80–5.20)
RDW: 13.5 % (ref 11.5–14.5)
WBC: 10.7 10*3/uL (ref 3.6–11.0)

## 2017-08-14 LAB — COMPREHENSIVE METABOLIC PANEL
ALBUMIN: 3.2 g/dL — AB (ref 3.5–5.0)
ALK PHOS: 59 U/L (ref 38–126)
ALT: 24 U/L (ref 14–54)
ANION GAP: 7 (ref 5–15)
AST: 19 U/L (ref 15–41)
BILIRUBIN TOTAL: 0.7 mg/dL (ref 0.3–1.2)
BUN: 7 mg/dL (ref 6–20)
CALCIUM: 9.5 mg/dL (ref 8.9–10.3)
CO2: 24 mmol/L (ref 22–32)
Chloride: 102 mmol/L (ref 101–111)
Creatinine, Ser: 0.65 mg/dL (ref 0.44–1.00)
GFR calc Af Amer: >60 mL/min (ref >60)
GFR calc non Af Amer: >60 mL/min (ref >60)
GLUCOSE: 157 mg/dL — AB (ref 65–99)
Potassium: 3.7 mmol/L (ref 3.5–5.1)
SODIUM: 133 mmol/L — AB (ref 135–145)
Total Protein: 6.9 g/dL (ref 6.5–8.1)

## 2017-08-14 MED ORDER — FULVESTRANT 250 MG/5ML IM SOLN
500.0000 mg | Freq: Once | INTRAMUSCULAR | Status: AC
  Administered 2017-08-14: 500 mg via INTRAMUSCULAR
  Filled 2017-08-14: qty 10

## 2017-08-14 NOTE — Progress Notes (Signed)
Patient here for follow up. Complains of pressure to lower part of abdomen. Has not had a BM since Sunday and urinating is painful.

## 2017-08-15 ENCOUNTER — Inpatient Hospital Stay: Payer: Medicare Other

## 2017-08-15 ENCOUNTER — Inpatient Hospital Stay
Admission: EM | Admit: 2017-08-15 | Discharge: 2017-08-20 | DRG: 189 | Disposition: A | Discharge status: Hospice | Payer: Medicare Other | Attending: Internal Medicine | Admitting: Internal Medicine

## 2017-08-15 ENCOUNTER — Emergency Department: Payer: Medicare Other

## 2017-08-15 ENCOUNTER — Other Ambulatory Visit (HOSPITAL_COMMUNITY): Payer: Self-pay

## 2017-08-15 ENCOUNTER — Ambulatory Visit: Payer: Self-pay | Admitting: General Surgery

## 2017-08-15 ENCOUNTER — Other Ambulatory Visit: Payer: Self-pay

## 2017-08-15 ENCOUNTER — Encounter: Payer: Self-pay | Admitting: Internal Medicine

## 2017-08-15 ENCOUNTER — Telehealth: Payer: Self-pay | Admitting: Genetic Counselor

## 2017-08-15 DIAGNOSIS — R0600 Dyspnea, unspecified: Secondary | ICD-10-CM | POA: Diagnosis not present

## 2017-08-15 DIAGNOSIS — J9621 Acute and chronic respiratory failure with hypoxia: Secondary | ICD-10-CM | POA: Diagnosis not present

## 2017-08-15 DIAGNOSIS — J90 Pleural effusion, not elsewhere classified: Secondary | ICD-10-CM | POA: Diagnosis not present

## 2017-08-15 DIAGNOSIS — Z170 Estrogen receptor positive status [ER+]: Secondary | ICD-10-CM

## 2017-08-15 DIAGNOSIS — D72829 Elevated white blood cell count, unspecified: Secondary | ICD-10-CM | POA: Diagnosis not present

## 2017-08-15 DIAGNOSIS — G4730 Sleep apnea, unspecified: Secondary | ICD-10-CM | POA: Diagnosis present

## 2017-08-15 DIAGNOSIS — J45909 Unspecified asthma, uncomplicated: Secondary | ICD-10-CM | POA: Diagnosis present

## 2017-08-15 DIAGNOSIS — D473 Essential (hemorrhagic) thrombocythemia: Secondary | ICD-10-CM | POA: Diagnosis not present

## 2017-08-15 DIAGNOSIS — Z886 Allergy status to analgesic agent status: Secondary | ICD-10-CM

## 2017-08-15 DIAGNOSIS — Z806 Family history of leukemia: Secondary | ICD-10-CM

## 2017-08-15 DIAGNOSIS — C50512 Malignant neoplasm of lower-outer quadrant of left female breast: Secondary | ICD-10-CM

## 2017-08-15 DIAGNOSIS — J9600 Acute respiratory failure, unspecified whether with hypoxia or hypercapnia: Secondary | ICD-10-CM | POA: Diagnosis not present

## 2017-08-15 DIAGNOSIS — E860 Dehydration: Secondary | ICD-10-CM | POA: Diagnosis present

## 2017-08-15 DIAGNOSIS — Z9013 Acquired absence of bilateral breasts and nipples: Secondary | ICD-10-CM

## 2017-08-15 DIAGNOSIS — C7989 Secondary malignant neoplasm of other specified sites: Secondary | ICD-10-CM | POA: Diagnosis present

## 2017-08-15 DIAGNOSIS — C7800 Secondary malignant neoplasm of unspecified lung: Secondary | ICD-10-CM | POA: Diagnosis not present

## 2017-08-15 DIAGNOSIS — C786 Secondary malignant neoplasm of retroperitoneum and peritoneum: Secondary | ICD-10-CM | POA: Diagnosis not present

## 2017-08-15 DIAGNOSIS — Z91018 Allergy to other foods: Secondary | ICD-10-CM

## 2017-08-15 DIAGNOSIS — R6510 Systemic inflammatory response syndrome (SIRS) of non-infectious origin without acute organ dysfunction: Secondary | ICD-10-CM | POA: Diagnosis present

## 2017-08-15 DIAGNOSIS — J9601 Acute respiratory failure with hypoxia: Secondary | ICD-10-CM | POA: Diagnosis present

## 2017-08-15 DIAGNOSIS — J9611 Chronic respiratory failure with hypoxia: Secondary | ICD-10-CM | POA: Diagnosis not present

## 2017-08-15 DIAGNOSIS — K219 Gastro-esophageal reflux disease without esophagitis: Secondary | ICD-10-CM | POA: Diagnosis not present

## 2017-08-15 DIAGNOSIS — R188 Other ascites: Secondary | ICD-10-CM | POA: Diagnosis not present

## 2017-08-15 DIAGNOSIS — E785 Hyperlipidemia, unspecified: Secondary | ICD-10-CM | POA: Diagnosis present

## 2017-08-15 DIAGNOSIS — R0602 Shortness of breath: Secondary | ICD-10-CM | POA: Diagnosis not present

## 2017-08-15 DIAGNOSIS — R531 Weakness: Secondary | ICD-10-CM | POA: Diagnosis not present

## 2017-08-15 DIAGNOSIS — R918 Other nonspecific abnormal finding of lung field: Secondary | ICD-10-CM

## 2017-08-15 DIAGNOSIS — Z801 Family history of malignant neoplasm of trachea, bronchus and lung: Secondary | ICD-10-CM

## 2017-08-15 DIAGNOSIS — C800 Disseminated malignant neoplasm, unspecified: Secondary | ICD-10-CM | POA: Diagnosis not present

## 2017-08-15 DIAGNOSIS — E871 Hypo-osmolality and hyponatremia: Secondary | ICD-10-CM | POA: Diagnosis not present

## 2017-08-15 DIAGNOSIS — Z79899 Other long term (current) drug therapy: Secondary | ICD-10-CM

## 2017-08-15 DIAGNOSIS — Z888 Allergy status to other drugs, medicaments and biological substances status: Secondary | ICD-10-CM

## 2017-08-15 DIAGNOSIS — Z880 Allergy status to penicillin: Secondary | ICD-10-CM

## 2017-08-15 DIAGNOSIS — R5383 Other fatigue: Secondary | ICD-10-CM

## 2017-08-15 DIAGNOSIS — R739 Hyperglycemia, unspecified: Secondary | ICD-10-CM | POA: Diagnosis not present

## 2017-08-15 DIAGNOSIS — M1990 Unspecified osteoarthritis, unspecified site: Secondary | ICD-10-CM | POA: Diagnosis present

## 2017-08-15 DIAGNOSIS — Z515 Encounter for palliative care: Secondary | ICD-10-CM | POA: Diagnosis not present

## 2017-08-15 DIAGNOSIS — R180 Malignant ascites: Secondary | ICD-10-CM

## 2017-08-15 DIAGNOSIS — Z882 Allergy status to sulfonamides status: Secondary | ICD-10-CM

## 2017-08-15 DIAGNOSIS — Z66 Do not resuscitate: Secondary | ICD-10-CM | POA: Diagnosis present

## 2017-08-15 DIAGNOSIS — Z853 Personal history of malignant neoplasm of breast: Secondary | ICD-10-CM

## 2017-08-15 DIAGNOSIS — R630 Anorexia: Secondary | ICD-10-CM | POA: Diagnosis not present

## 2017-08-15 DIAGNOSIS — C482 Malignant neoplasm of peritoneum, unspecified: Secondary | ICD-10-CM | POA: Diagnosis not present

## 2017-08-15 DIAGNOSIS — Z8673 Personal history of transient ischemic attack (TIA), and cerebral infarction without residual deficits: Secondary | ICD-10-CM

## 2017-08-15 DIAGNOSIS — R140 Abdominal distension (gaseous): Secondary | ICD-10-CM | POA: Diagnosis not present

## 2017-08-15 DIAGNOSIS — R112 Nausea with vomiting, unspecified: Secondary | ICD-10-CM

## 2017-08-15 DIAGNOSIS — G893 Neoplasm related pain (acute) (chronic): Secondary | ICD-10-CM | POA: Diagnosis present

## 2017-08-15 DIAGNOSIS — R109 Unspecified abdominal pain: Secondary | ICD-10-CM | POA: Diagnosis not present

## 2017-08-15 DIAGNOSIS — E878 Other disorders of electrolyte and fluid balance, not elsewhere classified: Secondary | ICD-10-CM | POA: Diagnosis not present

## 2017-08-15 DIAGNOSIS — Z8042 Family history of malignant neoplasm of prostate: Secondary | ICD-10-CM

## 2017-08-15 DIAGNOSIS — F329 Major depressive disorder, single episode, unspecified: Secondary | ICD-10-CM | POA: Diagnosis present

## 2017-08-15 DIAGNOSIS — R079 Chest pain, unspecified: Secondary | ICD-10-CM | POA: Diagnosis not present

## 2017-08-15 DIAGNOSIS — K5900 Constipation, unspecified: Secondary | ICD-10-CM | POA: Diagnosis present

## 2017-08-15 DIAGNOSIS — F419 Anxiety disorder, unspecified: Secondary | ICD-10-CM | POA: Diagnosis present

## 2017-08-15 DIAGNOSIS — Z8614 Personal history of Methicillin resistant Staphylococcus aureus infection: Secondary | ICD-10-CM

## 2017-08-15 DIAGNOSIS — D630 Anemia in neoplastic disease: Secondary | ICD-10-CM | POA: Diagnosis present

## 2017-08-15 DIAGNOSIS — Z9889 Other specified postprocedural states: Secondary | ICD-10-CM

## 2017-08-15 DIAGNOSIS — C762 Malignant neoplasm of abdomen: Secondary | ICD-10-CM

## 2017-08-15 DIAGNOSIS — C801 Malignant (primary) neoplasm, unspecified: Secondary | ICD-10-CM

## 2017-08-15 DIAGNOSIS — Z91041 Radiographic dye allergy status: Secondary | ICD-10-CM

## 2017-08-15 DIAGNOSIS — R978 Other abnormal tumor markers: Secondary | ICD-10-CM | POA: Diagnosis present

## 2017-08-15 DIAGNOSIS — Z79891 Long term (current) use of opiate analgesic: Secondary | ICD-10-CM

## 2017-08-15 DIAGNOSIS — Z7189 Other specified counseling: Secondary | ICD-10-CM

## 2017-08-15 DIAGNOSIS — J910 Malignant pleural effusion: Secondary | ICD-10-CM | POA: Diagnosis not present

## 2017-08-15 DIAGNOSIS — D649 Anemia, unspecified: Secondary | ICD-10-CM

## 2017-08-15 DIAGNOSIS — F418 Other specified anxiety disorders: Secondary | ICD-10-CM

## 2017-08-15 LAB — COMPREHENSIVE METABOLIC PANEL
ALBUMIN: 3.5 g/dL (ref 3.5–5.0)
ALK PHOS: 66 U/L (ref 38–126)
ALT: 23 U/L (ref 14–54)
ANION GAP: 7 (ref 5–15)
AST: 21 U/L (ref 15–41)
BUN: 10 mg/dL (ref 6–20)
CALCIUM: 10 mg/dL (ref 8.9–10.3)
CHLORIDE: 98 mmol/L — AB (ref 101–111)
CO2: 25 mmol/L (ref 22–32)
Creatinine, Ser: 0.66 mg/dL (ref 0.44–1.00)
GFR calc Af Amer: >60 mL/min (ref >60)
GFR calc non Af Amer: >60 mL/min (ref >60)
Glucose, Bld: 165 mg/dL — ABNORMAL HIGH (ref 65–99)
POTASSIUM: 3.7 mmol/L (ref 3.5–5.1)
SODIUM: 130 mmol/L — AB (ref 135–145)
Total Bilirubin: 0.5 mg/dL (ref 0.3–1.2)
Total Protein: 7.6 g/dL (ref 6.5–8.1)

## 2017-08-15 LAB — LACTATE DEHYDROGENASE, PLEURAL OR PERITONEAL FLUID: LD FL: 190 U/L — AB (ref 3–23)

## 2017-08-15 LAB — GLUCOSE, PLEURAL OR PERITONEAL FLUID: Glucose, Fluid: 127 mg/dL

## 2017-08-15 LAB — GRAM STAIN

## 2017-08-15 LAB — CBC
HCT: 36.7 % (ref 35.0–47.0)
HEMOGLOBIN: 12.3 g/dL (ref 12.0–16.0)
MCH: 29.7 pg (ref 26.0–34.0)
MCHC: 33.4 g/dL (ref 32.0–36.0)
MCV: 88.8 fL (ref 80.0–100.0)
Platelets: 451 10*3/uL — ABNORMAL HIGH (ref 150–440)
RBC: 4.13 MIL/uL (ref 3.80–5.20)
RDW: 13.4 % (ref 11.5–14.5)
WBC: 12.3 10*3/uL — ABNORMAL HIGH (ref 3.6–11.0)

## 2017-08-15 LAB — BODY FLUID CELL COUNT WITH DIFFERENTIAL
EOS FL: 20 %
LYMPHS FL: 13 %
MONOCYTE-MACROPHAGE-SEROUS FLUID: 47 %
NEUTROPHIL FLUID: 19 %
OTHER CELLS FL: 1 %
Total Nucleated Cell Count, Fluid: 1275 cu mm

## 2017-08-15 LAB — PROTEIN, PLEURAL OR PERITONEAL FLUID: TOTAL PROTEIN, FLUID: 4.6 g/dL

## 2017-08-15 MED ORDER — ONDANSETRON HCL 4 MG/2ML IJ SOLN
4.0000 mg | Freq: Once | INTRAMUSCULAR | Status: AC
  Administered 2017-08-15: 4 mg via INTRAVENOUS
  Filled 2017-08-15: qty 2

## 2017-08-15 MED ORDER — ALPRAZOLAM 0.5 MG PO TABS
0.5000 mg | ORAL_TABLET | Freq: Every evening | ORAL | Status: DC | PRN
  Administered 2017-08-16: 13:00:00 0.5 mg via ORAL
  Filled 2017-08-15: qty 1

## 2017-08-15 MED ORDER — IOPAMIDOL (ISOVUE-370) INJECTION 76%
100.0000 mL | Freq: Once | INTRAVENOUS | Status: AC | PRN
  Administered 2017-08-15: 100 mL via INTRAVENOUS

## 2017-08-15 MED ORDER — SODIUM CHLORIDE 0.9 % IV SOLN
INTRAVENOUS | Status: DC
  Administered 2017-08-15 – 2017-08-16 (×3): via INTRAVENOUS

## 2017-08-15 MED ORDER — CYANOCOBALAMIN 500 MCG PO TABS
500.0000 ug | ORAL_TABLET | Freq: Every day | ORAL | Status: DC
  Administered 2017-08-15 – 2017-08-20 (×6): 500 ug via ORAL
  Filled 2017-08-15 (×7): qty 1

## 2017-08-15 MED ORDER — FENTANYL 25 MCG/HR TD PT72
25.0000 ug | MEDICATED_PATCH | TRANSDERMAL | Status: DC
Start: 2017-08-15 — End: 2017-08-16
  Administered 2017-08-15: 09:00:00 25 ug via TRANSDERMAL
  Filled 2017-08-15 (×2): qty 1

## 2017-08-15 MED ORDER — BISACODYL 5 MG PO TBEC
5.0000 mg | DELAYED_RELEASE_TABLET | Freq: Every day | ORAL | Status: DC | PRN
  Administered 2017-08-16: 5 mg via ORAL
  Filled 2017-08-15: qty 1

## 2017-08-15 MED ORDER — HYDROMORPHONE HCL 1 MG/ML IJ SOLN: 2.0000 mg | INTRAMUSCULAR | Status: DC | PRN

## 2017-08-15 MED ORDER — ONDANSETRON HCL 4 MG/2ML IJ SOLN
4.0000 mg | Freq: Once | INTRAMUSCULAR | Status: AC
  Administered 2017-08-15: 4 mg via INTRAVENOUS

## 2017-08-15 MED ORDER — ADULT MULTIVITAMIN W/MINERALS CH
1.0000 | ORAL_TABLET | Freq: Every day | ORAL | Status: DC
  Administered 2017-08-15 – 2017-08-19 (×5): 1 via ORAL
  Filled 2017-08-15 (×5): qty 1

## 2017-08-15 MED ORDER — MULTIVITAMINS PO CAPS: 1.0000 | ORAL_CAPSULE | Freq: Every day | ORAL | Status: DC

## 2017-08-15 MED ORDER — OXYCODONE HCL ER 10 MG PO T12A
10.0000 mg | EXTENDED_RELEASE_TABLET | Freq: Two times a day (BID) | ORAL | Status: DC
  Administered 2017-08-15 – 2017-08-16 (×3): 10 mg via ORAL
  Filled 2017-08-15 (×4): qty 1

## 2017-08-15 MED ORDER — ACETAMINOPHEN 325 MG PO TABS
650.0000 mg | ORAL_TABLET | Freq: Four times a day (QID) | ORAL | Status: DC | PRN
  Administered 2017-08-20: 03:00:00 650 mg via ORAL
  Filled 2017-08-15 (×2): qty 2

## 2017-08-15 MED ORDER — ENOXAPARIN SODIUM 40 MG/0.4ML ~~LOC~~ SOLN
40.0000 mg | SUBCUTANEOUS | Status: DC
  Administered 2017-08-16 – 2017-08-20 (×4): 40 mg via SUBCUTANEOUS
  Filled 2017-08-15 (×4): qty 0.4

## 2017-08-15 MED ORDER — SENNOSIDES-DOCUSATE SODIUM 8.6-50 MG PO TABS
1.0000 | ORAL_TABLET | Freq: Every day | ORAL | Status: DC
  Administered 2017-08-15: 1 via ORAL
  Filled 2017-08-15: qty 1

## 2017-08-15 MED ORDER — PANTOPRAZOLE SODIUM 40 MG PO TBEC
40.0000 mg | DELAYED_RELEASE_TABLET | Freq: Two times a day (BID) | ORAL | Status: DC
  Administered 2017-08-15 – 2017-08-20 (×11): 40 mg via ORAL
  Filled 2017-08-15 (×12): qty 1

## 2017-08-15 MED ORDER — MORPHINE SULFATE (PF) 2 MG/ML IV SOLN
2.0000 mg | Freq: Once | INTRAVENOUS | Status: AC
  Administered 2017-08-15: 2 mg via INTRAVENOUS
  Filled 2017-08-15: qty 1

## 2017-08-15 MED ORDER — MORPHINE SULFATE (PF) 2 MG/ML IV SOLN: 2.0000 mg | INTRAVENOUS | Status: DC | PRN

## 2017-08-15 MED ORDER — PROCHLORPERAZINE EDISYLATE 10 MG/2ML IJ SOLN
10.0000 mg | Freq: Four times a day (QID) | INTRAMUSCULAR | Status: DC | PRN
  Filled 2017-08-15: qty 2

## 2017-08-15 MED ORDER — ACETAMINOPHEN 650 MG RE SUPP: 650.0000 mg | Freq: Four times a day (QID) | RECTAL | Status: DC | PRN

## 2017-08-15 MED ORDER — SENNOSIDES-DOCUSATE SODIUM 8.6-50 MG PO TABS: 1.0000 | ORAL_TABLET | Freq: Every evening | ORAL | Status: DC | PRN

## 2017-08-15 MED ORDER — MORPHINE SULFATE (PF) 4 MG/ML IV SOLN: 4.0000 mg | INTRAVENOUS | Status: DC | PRN

## 2017-08-15 MED ORDER — PROMETHAZINE HCL 25 MG/ML IJ SOLN
12.5000 mg | Freq: Four times a day (QID) | INTRAMUSCULAR | Status: DC | PRN
  Administered 2017-08-15: 12.5 mg via INTRAVENOUS
  Filled 2017-08-15: qty 1

## 2017-08-15 MED ORDER — ONDANSETRON HCL 4 MG/2ML IJ SOLN
INTRAMUSCULAR | Status: AC
  Filled 2017-08-15: qty 2

## 2017-08-15 MED ORDER — ENSURE ENLIVE PO LIQD
237.0000 mL | Freq: Two times a day (BID) | ORAL | Status: DC
  Administered 2017-08-15 – 2017-08-20 (×6): 237 mL via ORAL

## 2017-08-15 MED ORDER — ALBUTEROL SULFATE (2.5 MG/3ML) 0.083% IN NEBU
2.5000 mg | INHALATION_SOLUTION | Freq: Four times a day (QID) | RESPIRATORY_TRACT | Status: DC | PRN
  Administered 2017-08-15 – 2017-08-16 (×3): 2.5 mg via RESPIRATORY_TRACT
  Filled 2017-08-15 (×3): qty 3

## 2017-08-15 MED ORDER — ENOXAPARIN SODIUM 40 MG/0.4ML ~~LOC~~ SOLN: 40.0000 mg | SUBCUTANEOUS | Status: DC

## 2017-08-15 MED ORDER — HYDROMORPHONE HCL 1 MG/ML IJ SOLN
1.0000 mg | INTRAMUSCULAR | Status: DC | PRN
  Administered 2017-08-15 – 2017-08-16 (×5): 1 mg via INTRAVENOUS
  Filled 2017-08-15 (×5): qty 1

## 2017-08-15 MED ORDER — SODIUM CHLORIDE 0.9 % IV BOLUS
1000.0000 mL | Freq: Once | INTRAVENOUS | Status: AC
  Administered 2017-08-15: 1000 mL via INTRAVENOUS

## 2017-08-15 MED ORDER — ONDANSETRON HCL 4 MG/2ML IJ SOLN
4.0000 mg | Freq: Four times a day (QID) | INTRAMUSCULAR | Status: DC | PRN
  Administered 2017-08-15 – 2017-08-17 (×3): 4 mg via INTRAVENOUS
  Filled 2017-08-15 (×3): qty 2

## 2017-08-15 NOTE — Progress Notes (Signed)
Hematology/Oncology Follow Up Note [facility] Telephone:([phone] [phone]   Patient Care Team: Rubye Beach as PCP - General (Family Medicine) Bary Castilla, Forest Gleason, MD (General Surgery) Earlie Server, MD as Consulting Physician (Oncology)  REASON FOR VISIT Follow up for treatment of breast cancer.  HISTORY OF PRESENTING ILLNESS/PERTINENT ONCOLOGY HISTORY Norma Johns is a 77 y.o.afemale who has above oncology history reviewed by me today presented for follow up visit for management of   evaluation and management of breast cancer.   Patient went for routine mammogram screening in May. Abnormalities was found on the left breast. Patient was called back to have diagnostic mammogram done on 08/08/2016 which showed left breast mass at 4:00 3 cm from nipple and a 4:00 6 cm of the nipple. Indeterminate left axillary lymph nodes.  Patient underwent biopsy on 08/15/2016. Pathology showed invasive lobular carcinoma for both breast lesion, . DCIS present, lymph node biopsy is positive for metastatic lobular carcinoma  MRI of the breast on 09/10/2016 showed the having small no mass area of progressive enhancement, left breast 2 biopsy proven malignancy seen in the left breast were separated by 3.4 cm band of weakly enhancing tissue. An area of stippled discontinuous no masses enhancement extends superiorly in the left breast in 2 the upper outer quadrant spanning approximately 3.2 cm anterior/superior to the more anterior of the 2 biopsy proven malignancy. Recommend MRI guided core biopsy of the treated area of non-mass enhancement. Patient was seen and evaluated by Dr. Tollie Pizza and position was left modified radical mastectomy, right simple mastectomy with sentinel node biopsy. Patient underwent surgery on 10/01/2016. SHe tolerated the procedure well.  She declined systemic chemotherapy or radiation therapy. She was started on Arimidex '1mg'$  daily since  September. Denies any hotflush or joint pain.  Her wound has healed well and she follows up with surgeon for seroma. She does not complaints about pain. She noticed a small area of fluid collection.   INTERVAL HISTORY Patient presents for follow-up.   Peritoneum carcinomatosis: US abdomen was done paracentesis can not be done due to small volume. CT biopsy was scheduled but patient says she could not make it as she has to take her husband to doctor's appointment.   Neoplasm related pain: located at left lower chest and diffuse abdominal pain and bloating: dull pain, constant, worse despite taking Oxycodone '5mg'$  every 6 hours. No aggravating factors.  Fatigue: stable.  Constipation: last BM was 3 days ago. Not taking any bowel regimen.  Nausea;   ROS:  Review of Systems  Constitutional: Positive for fatigue. Negative for chills.  HENT:   Negative for hearing loss, lump/mass and nosebleeds.   Eyes: Negative for eye problems and icterus.  Respiratory: Positive for shortness of breath. Negative for chest tightness, cough and hemoptysis.   Cardiovascular: Positive for chest pain. Negative for leg swelling.  Gastrointestinal: Positive for abdominal distention, abdominal pain and constipation. Negative for diarrhea.  Endocrine: Negative for hot flashes.  Genitourinary: Negative for difficulty urinating, dyspareunia, frequency and hematuria.   Musculoskeletal: Negative for arthralgias, back pain and myalgias.       Chronic back pain  Skin: Negative for itching and rash.  Neurological: Negative for dizziness, headaches and light-headedness.  Hematological: Negative.  Negative for adenopathy. Does not bruise/bleed easily.  Psychiatric/Behavioral: The patient is not nervous/anxious.     MEDICAL HISTORY:  Past Medical History:  Diagnosis Date  . Anemia   . Anxiety   . Arthritis   .  Asthma    WELL CONTROLLED  . Breast cancer ([place]) 2018   left breast cnacer  . Cancer ([place])    Bilat  Mastectomy  . Depression   . GERD (gastroesophageal reflux disease)   . Headache    H/O  . Hyperlipidemia   . Metastatic breast cancer ([place]) 07/25/2017  . Metastatic breast cancer ([place]) 07/25/2017  . Sleep apnea    DOES NOT USE CPAP-COULD NOT TOLERATE  . Stroke [facility]) 2004    SURGICAL HISTORY: Past Surgical History:  Procedure Laterality Date  . ABDOMINAL HYSTERECTOMY    . BLADDER REPAIR    . BREAST BIOPSY Right 03/23/1991   Fibroadenoma with intramammary lymph node. Right breast, 9:00.  Marland Kitchen CHOLECYSTECTOMY    . COLONOSCOPY  2005  . MASTECTOMY MODIFIED RADICAL Left 10/01/2016   Procedure: MASTECTOMY MODIFIED RADICAL;  Surgeon: Robert Bellow, MD;  Location: [facility] ORS;  Service: General;  Laterality: Left;  . MR MRA CAROTID  02/2010   Minimal plaque formation; no significant stenosis. Sx: Syncope  . [place] [facility]  . SENTINEL NODE BIOPSY Right 10/01/2016   Procedure: SENTINEL NODE BIOPSY;  Surgeon: Robert Bellow, MD;  Location: [facility] ORS;  Service: General;  Laterality: Right;  . SIMPLE MASTECTOMY WITH AXILLARY SENTINEL NODE BIOPSY Right 10/01/2016   Procedure: SIMPLE MASTECTOMY;  Surgeon: Robert Bellow, MD;  Location: [facility] ORS;  Service: General;  Laterality: Right;    SOCIAL HISTORY: Social History   Socioeconomic History  . Marital status: Married    Spouse name: Not on file  . Number of children: 2  . Years of education: Not on file  . Highest education level: 12th grade  Occupational History  . Occupation: retired  Scientific laboratory technician  . Financial resource strain: Very hard  . Food insecurity:    Worry: Never true    Inability: Never true  . Transportation needs:    Medical: No    Non-medical: No  Tobacco Use  . Smoking status: Never Smoker  . Smokeless tobacco: Never Used  Substance and Sexual Activity  . Alcohol use: No  . Drug use: No  . Sexual activity: Never  Lifestyle  . Physical activity:    Days per week: Not on file     Minutes per session: Not on file  . Stress: Very much  Relationships  . Social connections:    Talks on phone: Not on file    Gets together: Not on file    Attends religious service: Not on file    Active member of club or organization: Not on file    Attends meetings of clubs or organizations: Not on file    Relationship status: Not on file  . Intimate partner violence:    Fear of current or ex partner: No    Emotionally abused: No    Physically abused: No    Forced sexual activity: No  Other Topics Concern  . Not on file  Social History Narrative  . Not on file    FAMILY HISTORY: Family History  Problem Relation Age of Onset  . Cancer Father   . Prostate cancer Father   . Heart attack Mother   . Hypertension Mother   . CAD Mother   . Hypertension Son   . Diabetes Son   . Lung cancer Brother   . Leukemia Brother   . Lung cancer Brother   . Prostate cancer Brother   . Breast cancer Neg  Hx     ALLERGIES:  is allergic to chocolate; other; sulfa antibiotics; atorvastatin; minocycline hcl; naproxen; niacin; septra [sulfamethoxazole-trimethoprim]; vioxx [rofecoxib]; and penicillins.  MEDICATIONS:  No current facility-administered medications for this visit.    No current outpatient medications on file.   Facility-Administered Medications Ordered in Other Visits  Medication Dose Route Frequency Provider Last Rate Last Dose  . 0.9 %  sodium chloride infusion   Intravenous Continuous Arta Silence, MD 75 mL/hr at 08/15/17 0411    . acetaminophen (TYLENOL) tablet 650 mg  650 mg Oral Q6H PRN Arta Silence, MD       Or  . acetaminophen (TYLENOL) suppository 650 mg  650 mg Rectal Q6H PRN Arta Silence, MD      . albuterol (PROVENTIL) (2.5 MG/3ML) 0.083% nebulizer solution 2.5 mg  2.5 mg Nebulization Q6H PRN Arta Silence, MD      . ALPRAZolam Duanne Moron) tablet 0.5 mg  0.5 mg Oral QHS PRN Arta Silence, MD      . bisacodyl (DULCOLAX) EC tablet 5 mg   5 mg Oral Daily PRN Arta Silence, MD      . Derrill Memo ON 08/16/2017] enoxaparin (LOVENOX) injection 40 mg  40 mg Subcutaneous Q24H Sridharan, Prasanna, MD      . fentaNYL ([place] - dosed mcg/hr) patch 25 mcg  25 mcg Transdermal Q72H Arta Silence, MD   25 mcg at 08/15/17 0905  . HYDROmorphone (DILAUDID) injection 1 mg  1 mg Intravenous Q3H PRN Arta Silence, MD   1 mg at 08/15/17 0329   Or  . HYDROmorphone (DILAUDID) injection 2 mg  2 mg Intravenous Q3H PRN Arta Silence, MD      . multivitamin with minerals tablet 1 tablet  1 tablet Oral Daily Arta Silence, MD   1 tablet at 08/15/17 0903  . ondansetron (ZOFRAN) injection 4 mg  4 mg Intravenous Q6H PRN Arta Silence, MD   4 mg at 08/15/17 0916  . pantoprazole (PROTONIX) EC tablet 40 mg  40 mg Oral BID AC Arta Silence, MD   40 mg at 08/15/17 0903  . promethazine (PHENERGAN) injection 12.5 mg  12.5 mg Intravenous Q6H PRN Arta Silence, MD   12.5 mg at 08/15/17 0358  . senna-docusate (Senokot-S) tablet 1 tablet  1 tablet Oral QHS PRN Arta Silence, MD      . vitamin B-12 (CYANOCOBALAMIN) tablet 500 mcg  500 mcg Oral Daily Arta Silence, MD   500 mcg at 08/15/17 0903      .  PHYSICAL EXAMINATION: ECOG PERFORMANCE STATUS: 0 - Asymptomatic Vitals:   08/14/17 1012  BP: 134/81  Pulse: 81  Resp: 18  Temp: 98.5 F (36.9 C)   Filed Weights   08/14/17 1012  Weight: 170 lb 11.2 oz (77.4 kg)   Physical Exam  Constitutional: She is oriented to person, place, and time and well-developed, well-nourished, and in no distress. No distress.  HENT:  Head: Normocephalic and atraumatic.  Nose: Nose normal.  Mouth/Throat: Oropharynx is clear and moist. No oropharyngeal exudate.  Eyes: Pupils are equal, round, and reactive to light. EOM are normal. Left eye exhibits no discharge. No scleral icterus.  Neck: Normal range of motion. Neck supple. No JVD present.  Cardiovascular: Normal rate,  regular rhythm and normal heart sounds. Exam reveals no friction rub.  No murmur heard. Pulmonary/Chest: Effort normal and breath sounds normal. No respiratory distress. She has no wheezes. She has no rales. She exhibits no tenderness.  Left rib cage pain.  Abdominal: Soft. Bowel  sounds are normal. She exhibits distension. She exhibits no mass. There is no tenderness. There is no rebound and no guarding.  Musculoskeletal: Normal range of motion. She exhibits no edema or tenderness.  Lymphadenopathy:    She has no cervical adenopathy.  Neurological: She is alert and oriented to person, place, and time. No cranial nerve deficit. She exhibits normal muscle tone. Coordination normal.  Skin: Skin is warm and dry. No rash noted. She is not diaphoretic. No erythema.  Psychiatric: Memory, affect and judgment normal.  Breast exam is performed in seated and lying down position. Patient is status post bilateral mastectomy.  Scars are well healed,  focal area over the sternum questionable seroma. LABORATORY DATA:  I have reviewed the data as listed Lab Results  Component Value Date   WBC 12.3 (H) 08/15/2017   HGB 12.3 08/15/2017   HCT 36.7 08/15/2017   MCV 88.8 08/15/2017   PLT 451 (H) 08/15/2017   Recent Labs    08/11/17 1646 08/14/17 0941 08/15/17 0043  NA 133* 133* 130*  K 3.9 3.7 3.7  CL 101 102 98*  CO2 '24 24 25  '$ GLUCOSE 131* 157* 165*  BUN '8 7 10  '$ CREATININE 0.50 0.65 0.66  CALCIUM 9.4 9.5 10.0  GFRNONAA >60 >60 >60  GFRAA >60 >60 >60  PROT 7.5 6.9 7.6  ALBUMIN 3.8 3.2* 3.5  AST '20 19 21  '$ ALT 13* 24 23  ALKPHOS 50 59 66  BILITOT 0.7 0.7 0.5    Pathology 10/01/2016 SPECIMEN SUBMITTED:  A. Breast, left, and axillary contents  B. Breast, right; simple mastectomy  C. Sentinel node 1, right  DIAGNOSIS:  A. LEFT BREAST AND AXILLARY CONTENTS; MODIFIED RADICAL MASTECTOMY:  - INVASIVE CARCINOMA WITH DUCTAL AND LOBULAR FEATURES.  - LARGEST FOCUS OF INVASIVE CARCINOMA MEASURES 10  MM.  - NINETEEN OF NINETEEN LYMPH NODES POSITIVE FOR METASTASIS (19/19).  - BIOPSY SITE CHANGES, MARKER CLIP PRESENT.  - SURGICAL MARGINS ARE NEGATIVE.  B. RIGHT BREAST; SIMPLE MASTECTOMY:  - FIBROCYSTIC CHANGE.  - NEGATIVE FOR ATYPIA AND MALIGNANCY.  C. SENTINEL LYMPH NODE 1, RIGHT; EXCISION:  - NO TUMOR SEEN IN ONE LYMPH NODE (0/1).  Surgical Pathology Cancer Case Summary   INVASIVE CARCINOMA OF THEBREAST  Procedure: modified radical mastectomy  Specimen Laterality: left  Histologic Type: invasive carcinoma with ductal and lobular features  Histologic Grade (Nottingham Histologic Score)       Glandular ([place])/Tubular Differentiation: 3       Nuclear Pleomorphism: 1       Mitotic Rate: 1       Overall Grade: 1  Tumor Size: 10 mm  Ductal Carcinoma in Situ (DCIS): present       Nuclear Grade: 1       Extensive intraductal component: negative  Lymphovascular Invasion: present  Treatment Effect: not applicable  Margins:       Invasive carcinoma: margins negative       Distance from closest margin: 20 mm from deep margin       DCIS: margins negative         Distance from closest margin: 20 mm from deep margin  Regional Lymph nodes:    Total # lymph nodes examined: 19    # Sentinel lymph nodes examined: 0    # Lymph nodes with macrometastasis (>2.0 mm): 18    # Lymph nodes with isolated tumor cells (<0.28m): 0    # Lymph nodes with micrometastasis (> 0.2 mm and < 2.0 mm):  1    Extranodal extension: focally present  Pathologic Stage Classification (pTNM, AJCC 7th Edition)    TNM Descriptors: m (multiple foci of invasive carcinoma)    pTNM: mpT1b pN3a   Note: Two masses are identified. The 3rd grossly described nodule in the  upper outer quadrant is an intramammary node nearly replaced by tumor.  Biomarker testing was performed on a separate specimen and in summary:  Estrogen Receptor  (ER) Status: POSITIVE, >90%    Average intensity of staining: Strong  Progesterone Receptor (PgR) Status: POSITIVE, >90%    Average intensity of staining: Moderate  HER2 (by immunohistochemistry): NEGATIVE (Score 1+)    Pathology 10/01/2016 SPECIMEN SUBMITTED:  A. Breast, left, 4:00, 3 CMFN, biopsy  B. Breast, left, 4:00, 6 CMFN, biopsy  C. Lymph node, left axilla, biopsy  DIAGNOSIS:  A. BREAST, LEFT, 4 O'CLOCK 3 CM FROM NIPPLE; ULTRASOUND-GUIDED CORE  BIOPSY:  - INVASIVE LOBULAR CARCINOMA, CLASSIC TYPE.  Size of invasive carcinoma: 6 mm in this sample  Histologic grade of invasive carcinoma: Grade 1    Glandular/tubular differentiation score: 3    Nuclear pleomorphism score: 1    Mitotic rate score: 1    Total score: 5  Ductal carcinomain situ: Present, low grade  Lymphovascular invasion: Not identified  B. BREAST, LEFT, 4 O'CLOCK 6 CM FROM NIPPLE; ULTRASOUND-GUIDED CORE  BIOPSY:  - INVASIVE LOBULAR CARCINOMA, CLASSIC TYPE.  Size of invasive carcinoma: 6 mm in this sample  Histologic grade of invasive carcinoma: Grade 1    Glandular/tubular differentiation score: 3    Nuclear pleomorphism score: 1    Mitotic rate score: 1    Total score: 5  Ductal carcinoma in situ: Not identified  Lymphovascular invasion: Not identified  C. LYMPH NODE, LEFT AXILLA; ULTRASOUND-GUIDED CORE BIOPSY:  - METASTATIC LOBULAR CARCINOMA; TUMOR SPANS 7 MM.  - NO EXTRANODAL EXTENSION IN THIS SAMPLE.   Bone scan was independantly reviewed and discussed with patient that she does not have metastatic bone disease.   ASSESSMENT & PLAN:  77 yo female with stage III B, ER/PR positive HER-2 negative invasive carcinoma with both ductal and lobular features. She has 19 out of 19 axillary lymph nodes positive on the left. S/p lumpectomy and declined both chemotherapy and radiation, persistent elevated CA 27.29, previously declined adjuvant chemotherapy and CT scan surveillance,  presents for discussion about recent CT scan which indicates disease recurrence.  Cancer Staging Malignant neoplasm of lower-outer quadrant of left breast of female, estrogen receptor positive ([place]) Staging form: Breast, AJCC 8th Edition - Clinical stage from 10/22/2016: Stage IIIB (cT1b(m), cN3a, cM0, G1, ER: Positive, PR: Positive, HER2: Negative) - Signed by Earlie Server, MD on 10/22/2016 - Pathologic stage from 10/22/2016: pT1b(m), pN3a, cM0, ER: Positive, PR: Positive, HER2: Negative - Signed by Earlie Server, MD on 10/22/2016  1. Peritoneal carcinomatosis ([place])   2. Neoplasm related pain   3. Goals of care, counseling/discussion   4. Nausea without vomiting    # Reviewed biopsy result with patient.  Surprisingly, omental mass biopsy showed adenocarcinoma, however, histology is not similar to her previous breast cancer.  Positive for CDX2, negative for GATA3, and PAX3, suspect adenocarcinoma of GI tract/pancreatobiliary / ovary.  Tumor markers CEA 40, CA 19.9 36, CA125 91.9.  I have discussed with pathologist and send tissue of origin tests. No enough tissue to also send NGS.   Continue  Faslodex for adjuvant hormone treatment for breast cancer.   Plan Abdominal and pelvis MRI.  Patient previously declined  chemotherapy. We had a lengthy discussion about rationale and side effects of chemotherapy. She agrees now for palliative chemotherapy.   If no primary was found, plan start palliative chemotherapy with carboplatin and Gemcitabine to cover GI/ovary origin. Refer to Chemotherapy class Medi port placement schedule with Dr.Toth.   # Nausea; continue ODT Zofran # Follow up with Dr.Brahmanday or covering physician next week for assessment prior to starting cycle 1 palliative chemotherapy.   # Family history of prostate cancer, personal history of breast cancer, now carcinomatosis with unknown origin. Send genetic testing.   # goal of care discussed with patient. She is aware that her disease is  not curable.  Treatment is with palliative intent.   Total face to face encounter time for this patient visit was 40 min. >50% of the time was  spent in counseling and coordination of care.  All questions were answered. The patient knows to call the clinic with any problems questions or concerns.  Earlie Server, MD, PhD Hematology Oncology [facility] at [facility] Pager- [phone] 08/15/2017

## 2017-08-15 NOTE — Consult Note (Signed)
Hematology/Oncology Consult note [facility] [phone] [phone]  Patient Care Team: Rubye Beach as PCP - General (Family Medicine) Bary Castilla, Forest Gleason, MD (General Surgery) Earlie Server, MD as Consulting Physician (Oncology)   Name of the patient: Norma Johns  [id number]  1941-01-11   Date of visit: 08/15/17 REASON FOR COSULTATION:  Metastatic cancer History of presenting illness-  77 y.o. female with history of stage IIIB [mpT1b pN3a] breast cancer [ER/PR+ HER2-] status post mastectomy, declined adjuvant chemotherapy, recent finding of peritoneum carcinomatosis presented to the emergency room complaining intractable chest and right-sided abdominal pain, progressive shortness of breath, nausea and vomiting.  CT of chest showed increased moderate bilateral pleural effusion, hazy groundglass opacities of the lungs, subcentimeter pulmonary nodules.  Currently admitted for further management. Status post left side thoracentesis with 534m fluid drained. Patient reports feeling fatigue and weak, lack of appetite.  Abdominal pain and nausea.     Review of Systems  Constitutional: Positive for malaise/fatigue. Negative for chills, fever and weight loss.  HENT: Negative for congestion, ear discharge, ear pain, nosebleeds, sinus pain and sore throat.   Eyes: Negative for double vision, photophobia and pain.  Respiratory: Positive for shortness of breath. Negative for cough, hemoptysis, sputum production and wheezing.   Cardiovascular: Negative for chest pain, palpitations and orthopnea.  Gastrointestinal: Positive for abdominal pain and nausea. Negative for blood in stool, constipation, diarrhea, heartburn, melena and vomiting.  Genitourinary: Negative for dysuria, flank pain, frequency and hematuria.  Musculoskeletal: Negative for back pain, myalgias and neck pain.  Skin: Negative for itching and rash.  Neurological: Positive for  weakness. Negative for dizziness and headaches.  Endo/Heme/Allergies: Negative for environmental allergies. Does not bruise/bleed easily.  Psychiatric/Behavioral: Negative for depression and hallucinations. The patient is not nervous/anxious.     Allergies  Allergen Reactions  . Chocolate Shortness Of Breath  . Other Shortness Of Breath and Swelling    PT STATES ALLERGY [facility]  SWELLING TO ARM AND SOB.  PT CANNOT REMEMBER WHAT THE DYE WAS USED FOR.  . Sulfa Antibiotics Other (See Comments)    unknown  . Atorvastatin Other (See Comments)    Leg pain   . Minocycline Hcl Itching  . Naproxen Other (See Comments)    Indigestion  . Niacin Other (See Comments)    Felt like she was on fire  . Septra [Sulfamethoxazole-Trimethoprim] Other (See Comments)    unknown  . Vioxx [Rofecoxib] Other (See Comments)    unknown  . Penicillins Itching and Rash    Has patient had a PCN reaction causing immediate rash, facial/tongue/throat swelling, SOB or lightheadedness with hypotension: Unknown Has patient had a PCN reaction causing severe rash involving mucus membranes or skin necrosis: Unknown Has patient had a PCN reaction that required hospitalization: No Has patient had a PCN reaction occurring within the last 10 years: No If all of the above answers are "NO", then may proceed with Cephalosporin use.     Patient Active Problem List   Diagnosis Date Noted  . Acute hypoxemic respiratory failure ([facility] 08/15/2017  . Peritoneal carcinomatosis ([facility] 08/14/2017  . Neoplasm related pain 08/14/2017  . Metastatic breast cancer ([facility] 07/25/2017  . Goals of care, counseling/discussion 07/22/2017  . MRSA (methicillin resistant staph aureus) culture positive 04/05/2017  . Lymphedema 02/27/2017  . Shoulder weakness 02/27/2017  . Breast cancer ([facility] 10/01/2016  . Invasive lobular carcinoma of breast, stage 2, left ([facility]  09/18/2016  . Malignant neoplasm of  lower-outer quadrant of left breast of female, estrogen receptor positive ([place]) 08/25/2016  . History of CVA with residual deficit 04/17/2016  . Low back pain 06/13/2015  . Anxiety 08/24/2014  . Atrophic vaginitis 08/24/2014  . Body mass index (BMI) of 29.0-29.9 in adult 08/24/2014  . Cyst of nasal sinus 08/24/2014  . Degenerative joint disease 08/24/2014  . OP (osteoporosis) 08/24/2014  . Bulging eyes 08/24/2014  . GERD (gastroesophageal reflux disease) 08/16/2014  . History of colon polyps 09/21/2008  . Chronic recurrent sinusitis 09/17/2008  . Diabetes mellitus, type 2 ([place]) 08/03/2008  . Hypercholesteremia 06/22/2008  . Cannot sleep 03/23/2008  . Allergic rhinitis 01/08/2008  . Airway hyperreactivity 01/08/2008  . Carotid artery obstruction 01/08/2008  . Clinical depression 01/08/2008  . Benign hypertension 01/08/2008     Past Medical History:  Diagnosis Date  . Anemia   . Anxiety   . Arthritis   . Asthma    WELL CONTROLLED  . Breast cancer ([place]) 2018   left breast cnacer  . Cancer ([place])    Bilat Mastectomy  . Depression   . GERD (gastroesophageal reflux disease)   . Headache    H/O  . Hyperlipidemia   . Metastatic breast cancer ([place]) 07/25/2017  . Metastatic breast cancer ([place]) 07/25/2017  . Sleep apnea    DOES NOT USE CPAP-COULD NOT TOLERATE  . Stroke [facility]) 2004     Past Surgical History:  Procedure Laterality Date  . ABDOMINAL HYSTERECTOMY    . BLADDER REPAIR    . BREAST BIOPSY Right 03/23/1991   Fibroadenoma with intramammary lymph node. Right breast, 9:00.  Marland Kitchen CHOLECYSTECTOMY    . COLONOSCOPY  2005  . MASTECTOMY MODIFIED RADICAL Left 10/01/2016   Procedure: MASTECTOMY MODIFIED RADICAL;  Surgeon: Robert Bellow, MD;  Location: [facility] ORS;  Service: General;  Laterality: Left;  . MR MRA CAROTID  02/2010   Minimal plaque formation; no significant stenosis. Sx: Syncope  . [facility]  . SENTINEL NODE BIOPSY Right 10/01/2016    Procedure: SENTINEL NODE BIOPSY;  Surgeon: Robert Bellow, MD;  Location: [facility] ORS;  Service: General;  Laterality: Right;  . SIMPLE MASTECTOMY WITH AXILLARY SENTINEL NODE BIOPSY Right 10/01/2016   Procedure: SIMPLE MASTECTOMY;  Surgeon: Robert Bellow, MD;  Location: [facility] ORS;  Service: General;  Laterality: Right;    Social History   Socioeconomic History  . Marital status: Married    Spouse name: Not on file  . Number of children: 2  . Years of education: Not on file  . Highest education level: 12th grade  Occupational History  . Occupation: retired  Scientific laboratory technician  . Financial resource strain: Very hard  . Food insecurity:    Worry: Never true    Inability: Never true  . Transportation needs:    Medical: No    Non-medical: No  Tobacco Use  . Smoking status: Never Smoker  . Smokeless tobacco: Never Used  Substance and Sexual Activity  . Alcohol use: No  . Drug use: No  . Sexual activity: Never  Lifestyle  . Physical activity:    Days per week: Not on file    Minutes per session: Not on file  . Stress: Very much  Relationships  . Social connections:    Talks on phone: Not on file    Gets together: Not on file    Attends religious service: Not on file  Active member of club or organization: Not on file    Attends meetings of clubs or organizations: Not on file    Relationship status: Not on file  . Intimate partner violence:    Fear of current or ex partner: No    Emotionally abused: No    Physically abused: No    Forced sexual activity: No  Other Topics Concern  . Not on file  Social History Narrative  . Not on file     Family History  Problem Relation Age of Onset  . Cancer Father   . Prostate cancer Father   . Heart attack Mother   . Hypertension Mother   . CAD Mother   . Hypertension Son   . Diabetes Son   . Lung cancer Brother   . Leukemia Brother   . Lung cancer Brother   . Prostate cancer Brother   . Breast cancer Neg Hx       Current Facility-Administered Medications:  .  0.9 %  sodium chloride infusion, , Intravenous, Continuous, Sridharan, Prasanna, MD, Last Rate: 75 mL/hr at 08/15/17 1908 .  acetaminophen (TYLENOL) tablet 650 mg, 650 mg, Oral, Q6H PRN **OR** acetaminophen (TYLENOL) suppository 650 mg, 650 mg, Rectal, Q6H PRN, Jodell Cipro, Prasanna, MD .  albuterol (PROVENTIL) (2.5 MG/3ML) 0.083% nebulizer solution 2.5 mg, 2.5 mg, Nebulization, Q6H PRN, Jodell Cipro, Prasanna, MD .  ALPRAZolam Duanne Moron) tablet 0.5 mg, 0.5 mg, Oral, QHS PRN, Jodell Cipro, Prasanna, MD .  bisacodyl (DULCOLAX) EC tablet 5 mg, 5 mg, Oral, Daily PRN, Arta Silence, MD .  Derrill Memo ON 08/16/2017] enoxaparin (LOVENOX) injection 40 mg, 40 mg, Subcutaneous, Q24H, Sridharan, Prasanna, MD .  feeding supplement (ENSURE ENLIVE) (ENSURE ENLIVE) liquid 237 mL, 237 mL, Oral, BID BM, Gladstone Lighter, MD, 237 mL at 08/15/17 1622 .  fentaNYL (DURAGESIC - dosed mcg/hr) patch 25 mcg, 25 mcg, Transdermal, Q72H, Arta Silence, MD, 25 mcg at 08/15/17 0905 .  HYDROmorphone (DILAUDID) injection 1 mg, 1 mg, Intravenous, Q3H PRN, 1 mg at 08/15/17 1903 **OR** HYDROmorphone (DILAUDID) injection 2 mg, 2 mg, Intravenous, Q3H PRN, Arta Silence, MD .  multivitamin with minerals tablet 1 tablet, 1 tablet, Oral, Daily, Arta Silence, MD, 1 tablet at 08/15/17 0903 .  ondansetron (ZOFRAN) injection 4 mg, 4 mg, Intravenous, Q6H PRN, Arta Silence, MD, 4 mg at 08/15/17 0916 .  oxyCODONE (OXYCONTIN) 12 hr tablet 10 mg, 10 mg, Oral, Q12H, Kalisetti, Radhika, MD, 10 mg at 08/15/17 1621 .  pantoprazole (PROTONIX) EC tablet 40 mg, 40 mg, Oral, BID AC, Jodell Cipro, Prasanna, MD, 40 mg at 08/15/17 1622 .  promethazine (PHENERGAN) injection 12.5 mg, 12.5 mg, Intravenous, Q6H PRN, Jodell Cipro, Prasanna, MD, 12.5 mg at 08/15/17 0358 .  senna-docusate (Senokot-S) tablet 1 tablet, 1 tablet, Oral, QHS PRN, Jodell Cipro, Prasanna, MD .  senna-docusate (Senokot-S)  tablet 1 tablet, 1 tablet, Oral, QHS, Kalisetti, Radhika, MD .  vitamin B-12 (CYANOCOBALAMIN) tablet 500 mcg, 500 mcg, Oral, Daily, Jodell Cipro, Prasanna, MD, 500 mcg at 08/15/17 0903   Physical exam: ECOG  Vitals:   08/15/17 1417 08/15/17 1559 08/15/17 1934 08/15/17 1936  BP:  116/73  140/86  Pulse:  79 88 85  Resp:  18    Temp:  98.5 F (36.9 C)  99.3 F (37.4 C)  TempSrc:  Oral  Oral  SpO2:  96% 93% 93%  Weight: 175 lb 4.3 oz (79.5 kg)     Height:       Physical Exam  Constitutional: She is oriented to person, place,  and time.  HENT:  Head: Normocephalic and atraumatic.  Nose: Nose normal.  Mouth/Throat: Oropharynx is clear and moist. No oropharyngeal exudate.  Eyes: Pupils are equal, round, and reactive to light. EOM are normal. No scleral icterus.  Neck: Normal range of motion. Neck supple.  Cardiovascular: Normal rate and regular rhythm.  Pulmonary/Chest: Effort normal. No respiratory distress. She has no wheezes.    Decreased breath sounds bilaterally.  Abdominal: Soft. She exhibits distension. There is no tenderness. There is no rebound.  Musculoskeletal: Normal range of motion. She exhibits no edema.  Lymphadenopathy:    She has no cervical adenopathy.  Neurological: She is alert and oriented to person, place, and time. No cranial nerve deficit.  Skin: Skin is warm and dry. She is not diaphoretic. No erythema.  Psychiatric: Affect and judgment normal.        CMP Latest Ref Rng & Units 08/15/2017  Glucose 65 - 99 mg/dL 165(H)  BUN 6 - 20 mg/dL 10  Creatinine 0.44 - 1.00 mg/dL 0.66  Sodium 135 - 145 mmol/L 130(L)  Potassium 3.5 - 5.1 mmol/L 3.7  Chloride 101 - 111 mmol/L 98(L)  CO2 22 - 32 mmol/L 25  Calcium 8.9 - 10.3 mg/dL 10.0  Total Protein 6.5 - 8.1 g/dL 7.6  Total Bilirubin 0.3 - 1.2 mg/dL 0.5  Alkaline Phos 38 - 126 U/L 66  AST 15 - 41 U/L 21  ALT 14 - 54 U/L 23   CBC Latest Ref Rng & Units 08/15/2017  WBC 3.6 - 11.0 K/uL 12.3(H)  Hemoglobin 12.0 -  16.0 g/dL 12.3  Hematocrit 35.0 - 47.0 % 36.7  Platelets 150 - 440 K/uL 451(H)   RADIOGRAPHIC STUDIES: I have personally reviewed the radiological images as listed and agreed with the findings in the report. Dg Chest 2 View  Result Date: 07/30/2017 CLINICAL DATA:  Shortness of breath and near syncope. History of breast cancer. EXAM: CHEST - 2 VIEW COMPARISON:  CT chest dated Jul 17, 2017. Chest x-ray dated December 31, 2012. FINDINGS: The heart size and mediastinal contours are within normal limits. Mild interstitial prominence bilaterally. Small left pleural effusion with left lower lobe atelectasis. No pneumothorax. No acute osseous abnormality. IMPRESSION: 1. Unchanged small left pleural effusion with adjacent left lower lobe atelectasis. Electronically Signed   By: Titus Dubin M.D.   On: 07/30/2017 16:06   Dg Abdomen 1 View  Result Date: 07/30/2017 CLINICAL DATA:  76 year old female with near syncopal episode abdominal pain post chest CT today. EXAM: ABDOMEN - 1 VIEW COMPARISON:  07/30/2017 chest CT.  07/17/2017 CT abdomen and pelvis. FINDINGS: Residual contrast in renal collecting system/bladder. Gas-filled top-normal size colon splenic flexure level. Nonspecific gas collection right upper quadrant. Findings similar to scout view of recent chest CT. CT detected ascites and peritoneal carcinomatosis not adequately assessed by plain film exam. IMPRESSION: Gas-filled top-normal size colon splenic flexure level. Nonspecific gas collection right upper quadrant. Findings similar to scout view of recent chest CT. CT detected ascites and peritoneal carcinomatosis not adequately assessed by plain film exam. Electronically Signed   By: Genia Del M.D.   On: 07/30/2017 20:19   Ct Chest W Contrast  Result Date: 08/15/2017 CLINICAL DATA:  77 y/o F; history of breast cancer with metastasis to the stomach presenting with right-sided abdominal pain. EXAM: CT CHEST WITH CONTRAST TECHNIQUE: Multidetector CT  imaging of the chest was performed during intravenous contrast administration. CONTRAST:  164m ISOVUE-370 IOPAMIDOL (ISOVUE-370) INJECTION 76% COMPARISON:  07/30/2017 CT of  the chest. 08/11/2017 CT abdomen and pelvis. FINDINGS: Cardiovascular: No significant vascular findings. Normal heart size. No pericardial effusion. Mediastinum/Nodes: No lymphadenopathy by imaging criteria. Thyroid gland, trachea, and esophagus demonstrate no significant findings. Lungs/Pleura: There are several stable subcentimeter pulmonary nodules at the periphery of the right upper lobe and middle lobe. Interval development of consolidations within the lower lobes bilaterally, hazy ground-glass opacities of the upper lobes, and interlobular septal thickening. Increased moderate bilateral pleural effusions. Upper Abdomen: Small volume of ascites. Prominent gastrohepatic lymph nodes better characterized on prior CT of abdomen and pelvis. Cholecystectomy. Musculoskeletal: No chest wall abnormality. No acute or significant osseous findings. IMPRESSION: 1. Increased moderate bilateral pleural effusions, hazy ground-glass opacities of the lungs, and smooth interlobular septal thickening. Findings probably represent pulmonary edema. 2. Dependent lower lobe consolidations probably represent atelectasis. Underlying pneumonia is possible. 3. Several stable subcentimeter pulmonary nodules at the periphery of the right upper lobe and middle lobe. Lower lobe nodules are obscured by consolidation. Consider reassessment for pulmonary metastatic disease after resolution of acute airspace disease and effusions. Electronically Signed   By: Kristine Garbe M.D.   On: 08/15/2017 03:07   Ct Chest W Contrast  Result Date: 07/17/2017 CLINICAL DATA:  Left breast cancer follow-up. EXAM: CT CHEST, ABDOMEN, AND PELVIS WITH CONTRAST TECHNIQUE: Multidetector CT imaging of the chest, abdomen and pelvis was performed following the standard protocol during  bolus administration of intravenous contrast. CONTRAST:  18m ISOVUE-300 IOPAMIDOL (ISOVUE-300) INJECTION 61% COMPARISON:  None. 10/30/2016 FINDINGS: CT CHEST FINDINGS Cardiovascular: No significant vascular findings. Normal heart size. No pericardial effusion. Mediastinum/Nodes: Normal appearance of the thyroid gland. The trachea appears patent and is midline. Normal appearance of the esophagus. No enlarged mediastinal or hilar lymph nodes. No supraclavicular or axillary adenopathy. Lungs/Pleura: There are small bilateral pleural effusions, left greater than right. Index nodule in the left base is unchanged measuring 7 mm, image 85/4. There is a new irregular nodule within the left upper lobe which measures 5 mm, image 39/4. Two subpleural nodules in the right middle lobe are unchanged measuring 3 mm, image 75/4 and image 74/4. 4 mm lung nodule in the left base is new from previous exam, 91/4. Also new is a right upper lobe lung nodule measuring 4 mm, image 43/7. New anterior left upper lobe lung nodule measures 4 mm, image 30/4. Musculoskeletal: Spondylosis identified within the thoracic spine. No suspicious bone lesions. Status post bilateral mastectomy. CT ABDOMEN PELVIS FINDINGS Hepatobiliary: No focal liver abnormality is seen. Status post cholecystectomy. No biliary dilatation. Pancreas: Unremarkable. No pancreatic ductal dilatation or surrounding inflammatory changes. Spleen: Normal in size without focal abnormality. Adrenals/Urinary Tract: Normal appearance of the adrenal glands. 3 mm stone within the inferior pole of the left kidney is identified, image 63/2. No mass or hydronephrosis identified. The urinary bladder appears normal. Stomach/Bowel: The stomach and small bowel loops have a normal course and caliber. There is no pathologic dilatation of the colon. Vascular/Lymphatic: Normal appearance of the abdominal aorta. No abdominal or pelvic adenopathy. Reproductive: Uterus appears surgically absent.   No adnexal mass. Other: Interval development of moderate volume of ascites within the abdomen and pelvis. Peritoneal carcinomatosis with omental caking is identified, image 81/2. The omental cake has a thickness of approximately 2.3 cm. Peritoneal implant within the upper abdomen slightly left of the midline measures 3.5 cm, image 56/2. Musculoskeletal: Degenerative disc disease identified within the lumbar spine. No suspicious bone lesions. IMPRESSION: 1. Interval development of peritoneal carcinomatosis. 2. Scattered new small pulmonary nodules  are noted in both lungs. Cannot rule out foci of metastatic disease. 3. New bilateral pleural effusions. 4. Nonobstructing left renal calculus. Electronically Signed   By: Kerby Moors M.D.   On: 07/17/2017 11:38   Ct Angio Chest Pe W And/or Wo Contrast  Result Date: 07/30/2017 CLINICAL DATA:  PE suspected, intermediate prob, positive D-dimer. Acute onset of shortness of breath earlier today when here for a bone scan. Abdominal pain. EXAM: CT ANGIOGRAPHY CHEST WITH CONTRAST TECHNIQUE: Multidetector CT imaging of the chest was performed using the standard protocol during bolus administration of intravenous contrast. Multiplanar CT image reconstructions and MIPs were obtained to evaluate the vascular anatomy. CONTRAST:  43m ISOVUE-370 IOPAMIDOL (ISOVUE-370) INJECTION 76% COMPARISON:  Chest and abdominal CT 07/17/2017 FINDINGS: Cardiovascular: There are no filling defects within the pulmonary arteries to suggest pulmonary embolus. Tortuosity of the thoracic aorta without dissection. Common origin of the left common carotid and brachiocephalic artery, normal variant arch configuration. Upper normal heart size. No pericardial effusion. Mediastinum/Nodes: Increase size of right hilar lymph nodes from prior exam currently 10 and 11 mm short axis, previously 7 mm. Rounded fluid density structure adjacent to the lower esophagus likely represents loculated ascites rather than  paraesophageal node. Possible 5 mm right internal mammary node versus adjacent chondral cartilage. Small anterior right epicardial nodes are unchanged. No axillary adenopathy. Post bilateral mastectomy. Lungs/Pleura: Evaluation of pulmonary nodules seen on prior exam is suboptimal due to breathing motion artifact. Majority these nodules are small measuring approximately 4 mm, and unchanged. However a right lower lobe nodule image 49 series 6 has increased in size currently 7 mm, previously 4 mm. The diaphragmatic nodule in the left lower lobe on prior exam is currently obscured. Left pleural effusion is increased, now small to moderate. There is adjacent compressive atelectasis. Tiny right pleural effusion. Minimal patchy ground-glass opacities in the right lower and left upper lobes are likely atelectasis. Upper Abdomen: Increased upper abdominal ascites since prior abdominal CT, moderate in degree. Musculoskeletal: There are no acute or suspicious osseous abnormalities. Degenerative change in the spine. Vertebral body hemangioma in T3, not hypermetabolic and bone scan earlier today. Review of the MIP images confirms the above findings. IMPRESSION: 1. No pulmonary embolus. 2. Increased left pleural effusion development of small right pleural effusion since exam 2 weeks ago. Increased upper abdominal ascites. 3. Small pulmonary nodules, majority of which are unchanged or not as well evaluated, however definite increase size of a right lower lobe pulmonary nodule over the past 2 weeks suggesting progression of metastatic disease. 4. New borderline right hilar lymph nodes measuring up to 11 mm short axis, nonspecific in the setting for reactive versus metastatic. Electronically Signed   By: MJeb LeveringM.D.   On: 07/30/2017 19:08   Nm Bone Scan Whole Body  Result Date: 07/30/2017 CLINICAL DATA:  Breast cancer.  Restaging.  Vomiting from pain. EXAM: NUCLEAR MEDICINE WHOLE BODY BONE SCAN TECHNIQUE: Whole body  anterior and posterior images were obtained approximately 3 hours after intravenous injection of radiopharmaceutical. RADIOPHARMACEUTICALS:  21.4 mCi Technetium-959mDP IV COMPARISON:  Bone scan 10/30/2016, PET-CT scan 08/30/2016 FINDINGS: No focal uptake in the axillary or appendicular skeleton to localize skeletal metastasis. Diffuse uptake in the anterior calvarium is similar comparison exam in favored physiologic or metabolic. IMPRESSION: No scintigraphic evidence skeletal metastasis. Electronically Signed   By: StSuzy Bouchard.D.   On: 07/30/2017 16:58   Ct Abdomen Pelvis W Contrast  Result Date: 08/11/2017 CLINICAL DATA:  Increase weakness and  poor appetite. Vomiting for 3 days. Metastatic breast cancer. EXAM: CT ABDOMEN AND PELVIS WITH CONTRAST TECHNIQUE: Multidetector CT imaging of the abdomen and pelvis was performed using the standard protocol following bolus administration of intravenous contrast. CONTRAST:  1101m ISOVUE-300 IOPAMIDOL (ISOVUE-300) INJECTION 61% COMPARISON:  08/07/2017 FINDINGS: Lower chest: There are moderate bilateral pleural effusions, left greater than right. Increased from previous exam. Passive atelectasis within the lower lobes noted. Small pulmonary nodules are identified bilaterally compatible with metastatic disease. Hepatobiliary: There is been previous cholecystectomy. No suspicious liver abnormality identified. Pancreas: Pancreas normal. Spleen: The spleen appears normal. Adrenals/Urinary Tract: Normal adrenal glands. No kidney mass or hydronephrosis identified bilaterally. Urinary bladder is unremarkable. Stomach/Bowel: The stomach appears nondistended. The small bowel loops have a normal caliber without obstruction. No pathologic dilatation of the colon. Distal colonic diverticulosis is identified without acute diverticulitis. Vascular/Lymphatic: Normal appearance of the abdominal aorta. Progressive adenopathy within the gastrohepatic and porta hepatis region. Index  lymph node measures 1 cm, image 26/2. Previously 0.7 cm. No retroperitoneal adenopathy. No pelvic or inguinal adenopathy. Reproductive: Status post hysterectomy.  No adnexal mass. Other: There is a moderate volume of ascites within the abdomen and pelvis which has increased from 07/17/2017. Peritoneal carcinomatosis with omental caking is identified. Peritoneal lesion in the left upper quadrant of the abdomen measures 3.6 cm, image 34/2. Previously 3.5 cm the omental cake has a maximum thickness of 2.7 cm, image 58/2. Previously 2.3 cm. Musculoskeletal: Degenerative disc disease within the lower thoracic and lumbar spine. No suspicious bone lesions identified. IMPRESSION: 1. No acute findings identified within the abdomen or pelvis. 2. Peritoneal carcinomatosis with increase in volume of ascites with mild increase in size of peritoneal lesions and omental caking. 3. Bilateral pleural effusions are increased in volume from previous exam. Small pulmonary nodules within the lungs compatible with metastatic disease. Electronically Signed   By: TKerby MoorsM.D.   On: 08/11/2017 18:08   Ct Abdomen Pelvis W Contrast  Result Date: 07/17/2017 CLINICAL DATA:  Left breast cancer follow-up. EXAM: CT CHEST, ABDOMEN, AND PELVIS WITH CONTRAST TECHNIQUE: Multidetector CT imaging of the chest, abdomen and pelvis was performed following the standard protocol during bolus administration of intravenous contrast. CONTRAST:  1032mISOVUE-300 IOPAMIDOL (ISOVUE-300) INJECTION 61% COMPARISON:  None. 10/30/2016 FINDINGS: CT CHEST FINDINGS Cardiovascular: No significant vascular findings. Normal heart size. No pericardial effusion. Mediastinum/Nodes: Normal appearance of the thyroid gland. The trachea appears patent and is midline. Normal appearance of the esophagus. No enlarged mediastinal or hilar lymph nodes. No supraclavicular or axillary adenopathy. Lungs/Pleura: There are small bilateral pleural effusions, left greater than  right. Index nodule in the left base is unchanged measuring 7 mm, image 85/4. There is a new irregular nodule within the left upper lobe which measures 5 mm, image 39/4. Two subpleural nodules in the right middle lobe are unchanged measuring 3 mm, image 75/4 and image 74/4. 4 mm lung nodule in the left base is new from previous exam, 91/4. Also new is a right upper lobe lung nodule measuring 4 mm, image 43/7. New anterior left upper lobe lung nodule measures 4 mm, image 30/4. Musculoskeletal: Spondylosis identified within the thoracic spine. No suspicious bone lesions. Status post bilateral mastectomy. CT ABDOMEN PELVIS FINDINGS Hepatobiliary: No focal liver abnormality is seen. Status post cholecystectomy. No biliary dilatation. Pancreas: Unremarkable. No pancreatic ductal dilatation or surrounding inflammatory changes. Spleen: Normal in size without focal abnormality. Adrenals/Urinary Tract: Normal appearance of the adrenal glands. 3 mm stone within the inferior pole  of the left kidney is identified, image 63/2. No mass or hydronephrosis identified. The urinary bladder appears normal. Stomach/Bowel: The stomach and small bowel loops have a normal course and caliber. There is no pathologic dilatation of the colon. Vascular/Lymphatic: Normal appearance of the abdominal aorta. No abdominal or pelvic adenopathy. Reproductive: Uterus appears surgically absent.  No adnexal mass. Other: Interval development of moderate volume of ascites within the abdomen and pelvis. Peritoneal carcinomatosis with omental caking is identified, image 81/2. The omental cake has a thickness of approximately 2.3 cm. Peritoneal implant within the upper abdomen slightly left of the midline measures 3.5 cm, image 56/2. Musculoskeletal: Degenerative disc disease identified within the lumbar spine. No suspicious bone lesions. IMPRESSION: 1. Interval development of peritoneal carcinomatosis. 2. Scattered new small pulmonary nodules are noted in  both lungs. Cannot rule out foci of metastatic disease. 3. New bilateral pleural effusions. 4. Nonobstructing left renal calculus. Electronically Signed   By: Kerby Moors M.D.   On: 07/17/2017 11:38   US Abdomen Limited  Result Date: 08/15/2017 CLINICAL DATA:  Metastatic breast carcinoma. Abdominal carcinomatosis. Bilateral pleural effusions and ascites noted on recent CT chest. Paracentesis requested. EXAM: LIMITED ABDOMEN ULTRASOUND FOR ASCITES TECHNIQUE: Limited ultrasound survey for ascites was performed in all four abdominal quadrants. COMPARISON:  CT 08/15/2017 FINDINGS: There is a small amount of scattered ascites, predominantly in the low pelvis. No large pocket to allow safe paracentesis. IMPRESSION: 1. Small amount of scattered pelvic ascites. Findings discussed with patient. Paracentesis deferred. Electronically Signed   By: Lucrezia Europe M.D.   On: 08/15/2017 10:46   Ct Biopsy  Result Date: 08/07/2017 INDICATION: Breast cancer, peritoneal carcinomatosis, ascites, concern for metastatic disease EXAM: CT-GUIDED BIOPSY ANTERIOR OMENTAL CARCINOMATOSIS MEDICATIONS: 1% lidocaine local ANESTHESIA/SEDATION: 1.5 mg IV Versed; 50 mcg IV Fentanyl Moderate Sedation Time:  13 minutes The patient was continuously monitored during the procedure by the interventional radiology nurse under my direct supervision. PROCEDURE: The procedure, risks, benefits, and alternatives were explained to the patient. Questions regarding the procedure were encouraged and answered. The patient understands and consents to the procedure. Previous imaging reviewed. Patient positioned supine. Noncontrast localization CT performed. The lower anterior omental carcinomatosis was localized. Overlying skin marked for a anterior right lateral approach. Under sterile conditions and local anesthesia, CT guidance was utilized to advance a 17 gauge 11.8 cm lesion to the omental carcinomatosis. Needle position confirmed with CT. 18 gauge core  biopsies obtained and placed in formalin. Needle removed. Postprocedure imaging demonstrates no hemorrhage or hematoma. Patient tolerated the procedure well without complication. Vital sign monitoring by nursing staff during the procedure will continue as patient is in the special procedures unit for post procedure observation. FINDINGS: The images document guide needle placement within the anterior omental mass. Post biopsy images demonstrate no hemorrhage or hematoma. COMPLICATIONS: None immediate. IMPRESSION: Successful CT-guided core biopsy of the anterior omental mass. Electronically Signed   By: Jerilynn Mages.  Shick M.D.   On: 08/07/2017 11:40   Dg Chest Port 1 View  Result Date: 08/15/2017 CLINICAL DATA:  S/p Thoracentesis- left side EXAM: PORTABLE CHEST - 1 VIEW COMPARISON:  the previous day's study FINDINGS: No pneumothorax. Small bilateral pleural effusions, decreased on the left since prior study. Improved aeration at the left lung base. Heart size and mediastinal contours are within normal limits. Low lung volumes with prominent perihilar bronchovascular markings. Some linear scarring or subsegmental atelectasis in both lung bases. Visualized bones unremarkable.  Cholecystectomy clips. IMPRESSION: 1. No pneumothorax post left thoracentesis,  with improvement in left lower lung consolidation/atelectasis. 2. Small residual bilateral pleural effusions. Electronically Signed   By: Lucrezia Europe M.D.   On: 08/15/2017 10:48   Dg Chest Portable 1 View  Result Date: 08/15/2017 CLINICAL DATA:  Breast cancer with gastric metastases. Right-sided abdominal pain. Shortness of breath. EXAM: PORTABLE CHEST 1 VIEW COMPARISON:  07/30/2017 FINDINGS: Shallow inspiration. Cardiac enlargement. Pulmonary vascularity is normal. There is increasing left and developing right pleural effusion with basilar atelectasis or consolidation. Interstitial pattern to the lungs could reflect interstitial edema or interstitial metastasis. No  pneumothorax. Mediastinal contours appear intact. IMPRESSION: Cardiac enlargement. Increasing left and developing right pleural effusion with basilar atelectasis or consolidation. Interstitial pattern to the lungs may indicate interstitial edema or metastasis. Electronically Signed   By: Lucienne Capers M.D.   On: 08/15/2017 01:26   [place] Ascites (abdomen Limited)  Result Date: 07/25/2017 CLINICAL DATA:  History of breast cancer, now with peritoneal carcinomatosis. Please perform evaluate abdomen for malignant ascites and perform ultrasound-guided paracentesis as indicated. EXAM: LIMITED ABDOMEN ULTRASOUND FOR ASCITES TECHNIQUE: Limited ultrasound survey for ascites was performed in all four abdominal quadrants. COMPARISON:  CT of the chest, abdomen and pelvis-07/17/2017 FINDINGS: Sonographic evaluation performed by the dictating interventional radiologist demonstrates only a trace amount of intra-abdominal ascites located within left upper abdominal quadrant (image 5). IMPRESSION: Trace amount of intra-abdominal ascites located within the left upper abdominal quadrant, too small to allow for safe ultrasound-guided paracentesis. Electronically Signed   By: Sandi Mariscal M.D.   On: 07/25/2017 14:06   US Thoracentesis Asp Pleural Space W/img Guide  Result Date: 08/15/2017 INDICATION: Bilateral pleural effusions.  Metastatic breast carcinoma. EXAM: ULTRASOUND GUIDED LEFT THORACENTESIS MEDICATIONS: LIDOCAINE 1% SUBCUTANEOUS COMPLICATIONS: None immediate. PROCEDURE: An ultrasound guided thoracentesis was thoroughly discussed with the patient and questions answered. The benefits, risks, alternatives and complications were also discussed. The patient understands and wishes to proceed with the procedure. Written consent was obtained. Survey ultrasound demonstrated bilateral pleural effusions, left greater than right. Ultrasound was performed to localize and mark an adequate pocket of fluid in the left chest. The area  was then prepped and draped in the normal sterile fashion. 1% Lidocaine was used for local anesthesia. Under ultrasound guidance a Safe-T-Centesis catheter was introduced. Thoracentesis was performed. The catheter was removed and a dressing applied. FINDINGS: A total of approximately 500 mL of clear amber fluid was removed. Samples were sent to the laboratory as requested by the clinical team. IMPRESSION: Successful ultrasound guided left thoracentesis yielding 500 mL of pleural fluid. Follow-up chest radiograph shows no pneumothorax. Electronically Signed   By: Lucrezia Europe M.D.   On: 08/15/2017 10:45    Assessment and plan- Patient is a 77 y.o. female with history of stage IIIb breast cancer, status post mastectomy and axillary lymph node dissection, recently discovered peritoneal carcinomatosis presented to emergency room complaining shortness of breath, intractable nausea vomiting, general weakness and abdominal pain.  #Peritoneal carcinomatosis, status post omental biopsy confirming metastatic adenocarcinoma. However per pathology report, this adenocarcinoma is histologically different than her breast cancer histology. Most likely GI or pancreatic origin.  Less likely ovary. Tumor markers including CEA, CA-19-9, C A1 25 nonconclusive, mildly elevated Her case was discussed on tumor board today.  Tissue of origin testing pending  I recommend obtaining MRI abdomen and the pelvis with and without contrast to look for occult primary. If no primary is discovered, plan treat with palliative chemotherapy with carboplatin and gemcitabine or Taxol empirically. Patient previously adamantly  refused this any chemotherapy option.  Yesterday when I talked to patient and her daughter, she changed her mind and open for chemotherapy options.  She was supposed to have Mediport placed by Dr. Marlou Starks next Monday.   #Pleural effusion, status post left thoracentesis.  #Neoplasm related pain: Currently on fentanyl 25 MCG  Q72h, OxyContin 10 mg twice daily, Dilaudid 1 to 2 mg every 12 as needed. Recommend using one long-acting narcotics either fentanyl or OxyContin, plus short acting narcotics.  #Nausea vomiting/dehydration: Zofran and Compazine as needed.  Gentle hydration.   Thank you for allowing me to participate in the care of this patient.  Above plan discussed with patient and primary team Dr. Tressia Miners. Total face to face encounter time for this patient visit was70 min. >50% of the time was  spent in counseling and coordination of care.    Earlie Server, MD, PhD Hematology Oncology [facility] Pager- [phone] 08/15/2017

## 2017-08-15 NOTE — Progress Notes (Signed)
Initial Nutrition Assessment  DOCUMENTATION CODES:   Obesity unspecified  INTERVENTION:   - Recommend bowel regimen per MD as pt has not had a bowel movement since 08/06/17  - Continue Ensure Enlive po BID, each supplement provides 350 kcal and 20 grams of protein  - Magic cup BID with meals, each supplement provides 290 kcal and 9 grams of protein  - Continue MVI with minerals daily  NUTRITION DIAGNOSIS:   Increased nutrient needs related to cancer and cancer related treatments(metastatic breast cancer with omental biopsy confirming adenocarcinoma) as evidenced by estimated needs.  GOAL:   Patient will meet greater than or equal to 90% of their needs  MONITOR:   PO intake, Supplement acceptance, I & O's, Weight trends, Labs  REASON FOR ASSESSMENT:   Malnutrition Screening Tool, Consult Assessment of nutrition requirement/status  ASSESSMENT:   77 year old female who presented to the ED with abdominal pain. PMH significant for metastatic breast cancer, GERD, and hyperlipidemia.  Pt is s/p thoracentesis this AM with 500 ml fluid drained. Attempted paracentesis but not enough fluid.  Omental biopsy confirmed metastatic adenocarcinoma.  Spoke with pt and family members at bedside. Pt reports that she hasn't been hungry for over a month. Pt believes this is due to swelling in her abdomen causing her to feel full quickly. Pt also endorses nausea and vomiting after eating.  Pt states that she did not eat any food yesterday but had 1 Equate and 1 Boost oral nutrition supplement. Pt reports that she drinks at least 2 oral nutrition supplements daily. If pt does eat solid food, she consumes popsicles, crackers, and 1/2 of a sandwich. Pt is agreeable to Ensure Enlive BID (prefers vanilla) and Magic Cup BID.  Pt states that her UBW is 157-162 lbs. Pt reports weight fluctuations related to fluid shifts. RD obtained bed weight at time of visit: 79.5 kg. RD recorded weight in  chart.  Medications reviewed and include: MVI with minerals daily, 40 mg Protonix BID, 500 mcg vitamin B-12 daily, PRN Zofran and Phenergan  Labs reviewed: sodium 130 (L), chloride 98 (L), WBC 12.3 (H), platelets 451 (H)  NUTRITION - FOCUSED PHYSICAL EXAM:    Most Recent Value  Orbital Region  Mild depletion  Upper Arm Region  No depletion  Thoracic and Lumbar Region  Unable to assess [ascites masking thoracic and lumbar region]  Buccal Region  No depletion  Temple Region  No depletion  Clavicle Bone Region  No depletion  Clavicle and Acromion Bone Region  Mild depletion  Scapular Bone Region  No depletion  Dorsal Hand  No depletion  Patellar Region  No depletion  Anterior Thigh Region  Mild depletion  Posterior Calf Region  Mild depletion  Edema (RD Assessment)  Severe [ascites]  Hair  Reviewed  Eyes  Reviewed  Mouth  Reviewed  Skin  Reviewed  Nails  Reviewed       Diet Order:   Diet Order           Diet regular Room service appropriate? Yes; Fluid consistency: Thin  Diet effective now          EDUCATION NEEDS:   No education needs have been identified at this time  Skin:  Skin Assessment: Reviewed RN Assessment  Last BM:  unknown/PTA  Height:   Ht Readings from Last 1 Encounters:  08/15/17 5\' 3"  (1.6 m)    Weight:   Wt Readings from Last 1 Encounters:  08/15/17 175 lb 4.3 oz (79.5 kg)  Ideal Body Weight:  52.3 kg  BMI:  Body mass index is 31.05 kg/m.  Estimated Nutritional Needs:   Kcal:  1650-1850 kcal/day  Protein:  95-110 grams/day  Fluid:  1.7-1.9 L/day    Gaynell Face, MS, RD, LDN Pager: [phone] Weekend/After Hours: [phone]

## 2017-08-15 NOTE — Progress Notes (Signed)
[facility] NAME: Norma Johns    MR#:  [id number]  DATE OF BIRTH:  10/10/1940  SUBJECTIVE:  CHIEF COMPLAINT:   Chief Complaint  Patient presents with  . Abdominal Pain   - admitted with severe abdominal pain, still has abd pain - attempted paracentesis but not enough fluid, left thoracentesis with 529ml drained  REVIEW OF SYSTEMS:  Review of Systems  Constitutional: Positive for malaise/fatigue. Negative for chills and fever.       Loss of appetite  HENT: Negative for congestion, ear discharge, hearing loss and nosebleeds.   Eyes: Negative for blurred vision and double vision.  Respiratory: Negative for cough, shortness of breath and wheezing.   Cardiovascular: Negative for chest pain and palpitations.  Gastrointestinal: Positive for abdominal pain and nausea. Negative for constipation, diarrhea and vomiting.  Genitourinary: Negative for dysuria.  Musculoskeletal: Negative for myalgias.  Neurological: Negative for dizziness, focal weakness, seizures, weakness and headaches.  Psychiatric/Behavioral: Negative for depression.    DRUG ALLERGIES:   Allergies  Allergen Reactions  . Chocolate Shortness Of Breath  . Other Shortness Of Breath and Swelling    PT STATES ALLERGY TO "SOME TYPE OF DYE THAT WAS USED AT [facility]."  SWELLING TO ARM AND SOB.  PT CANNOT REMEMBER WHAT THE DYE WAS USED FOR.  . Sulfa Antibiotics Other (See Comments)    unknown  . Atorvastatin Other (See Comments)    Leg pain   . Minocycline Hcl Itching  . Naproxen Other (See Comments)    Indigestion  . Niacin Other (See Comments)    Felt like she was on fire  . Septra [Sulfamethoxazole-Trimethoprim] Other (See Comments)    unknown  . Vioxx [Rofecoxib] Other (See Comments)    unknown  . Penicillins Itching and Rash    Has patient had a PCN reaction causing immediate rash, facial/tongue/throat swelling, SOB or lightheadedness with  hypotension: Unknown Has patient had a PCN reaction causing severe rash involving mucus membranes or skin necrosis: Unknown Has patient had a PCN reaction that required hospitalization: No Has patient had a PCN reaction occurring within the last 10 years: No If all of the above answers are "NO", then may proceed with Cephalosporin use.     VITALS:  Blood pressure (!) 142/73, pulse 85, temperature 99.7 F (37.6 C), temperature source Oral, resp. rate 16, height 5\' 3"  (1.6 m), weight 77.1 kg (170 lb), SpO2 92 %.  PHYSICAL EXAMINATION:  Physical Exam  GENERAL:  77 y.o.-year-old patient lying in the bed, in distress due to pain. EYES: Pupils equal, round, reactive to light and accommodation. No scleral icterus. Extraocular muscles intact.  HEENT: Head atraumatic, normocephalic. Oropharynx and nasopharynx clear.  NECK:  Supple, no jugular venous distention. No thyroid enlargement, no tenderness.  LUNGS: Normal breath sounds bilaterally, no wheezing, rales,rhonchi or crepitation. No use of accessory muscles of respiration. Significantly decreased bibasilar breath sounds CARDIOVASCULAR: S1, S2 normal. No rubs, or gallops. 2/6 systolic murmur present ABDOMEN: distended, firm, tender abdomen. Bowel sounds present. No organomegaly or mass.  EXTREMITIES: No pedal edema, cyanosis, or clubbing.  NEUROLOGIC: Cranial nerves II through XII are intact. Muscle strength 5/5 in all extremities. Sensation intact. Gait not checked.  PSYCHIATRIC: The patient is alert and oriented x 3.  SKIN: No obvious rash, lesion, or ulcer.    LABORATORY PANEL:   CBC Recent Labs  Lab 08/15/17 0043  WBC 12.3*  HGB 12.3  HCT 36.7  PLT  451*   ------------------------------------------------------------------------------------------------------------------  Chemistries  Recent Labs  Lab 08/15/17 0043  NA 130*  K 3.7  CL 98*  CO2 25  GLUCOSE 165*  BUN 10  CREATININE 0.66  CALCIUM 10.0  AST 21  ALT 23    ALKPHOS 66  BILITOT 0.5   ------------------------------------------------------------------------------------------------------------------  Cardiac Enzymes No results for input(s): TROPONINI in the last 168 hours. ------------------------------------------------------------------------------------------------------------------  RADIOLOGY:  Ct Chest W Contrast  Result Date: 08/15/2017 CLINICAL DATA:  77 y/o F; history of breast cancer with metastasis to the stomach presenting with right-sided abdominal pain. EXAM: CT CHEST WITH CONTRAST TECHNIQUE: Multidetector CT imaging of the chest was performed during intravenous contrast administration. CONTRAST:  116mL ISOVUE-370 IOPAMIDOL (ISOVUE-370) INJECTION 76% COMPARISON:  07/30/2017 CT of the chest. 08/11/2017 CT abdomen and pelvis. FINDINGS: Cardiovascular: No significant vascular findings. Normal heart size. No pericardial effusion. Mediastinum/Nodes: No lymphadenopathy by imaging criteria. Thyroid gland, trachea, and esophagus demonstrate no significant findings. Lungs/Pleura: There are several stable subcentimeter pulmonary nodules at the periphery of the right upper lobe and middle lobe. Interval development of consolidations within the lower lobes bilaterally, hazy ground-glass opacities of the upper lobes, and interlobular septal thickening. Increased moderate bilateral pleural effusions. Upper Abdomen: Small volume of ascites. Prominent gastrohepatic lymph nodes better characterized on prior CT of abdomen and pelvis. Cholecystectomy. Musculoskeletal: No chest wall abnormality. No acute or significant osseous findings. IMPRESSION: 1. Increased moderate bilateral pleural effusions, hazy ground-glass opacities of the lungs, and smooth interlobular septal thickening. Findings probably represent pulmonary edema. 2. Dependent lower lobe consolidations probably represent atelectasis. Underlying pneumonia is possible. 3. Several stable subcentimeter  pulmonary nodules at the periphery of the right upper lobe and middle lobe. Lower lobe nodules are obscured by consolidation. Consider reassessment for pulmonary metastatic disease after resolution of acute airspace disease and effusions. Electronically Signed   By: Kristine Garbe M.D.   On: 08/15/2017 03:07   US Abdomen Limited  Result Date: 08/15/2017 CLINICAL DATA:  Metastatic breast carcinoma. Abdominal carcinomatosis. Bilateral pleural effusions and ascites noted on recent CT chest. Paracentesis requested. EXAM: LIMITED ABDOMEN ULTRASOUND FOR ASCITES TECHNIQUE: Limited ultrasound survey for ascites was performed in all four abdominal quadrants. COMPARISON:  CT 08/15/2017 FINDINGS: There is a small amount of scattered ascites, predominantly in the low pelvis. No large pocket to allow safe paracentesis. IMPRESSION: 1. Small amount of scattered pelvic ascites. Findings discussed with patient. Paracentesis deferred. Electronically Signed   By: Lucrezia Europe M.D.   On: 08/15/2017 10:46   Dg Chest Port 1 View  Result Date: 08/15/2017 CLINICAL DATA:  S/p Thoracentesis- left side EXAM: PORTABLE CHEST - 1 VIEW COMPARISON:  the previous day's study FINDINGS: No pneumothorax. Small bilateral pleural effusions, decreased on the left since prior study. Improved aeration at the left lung base. Heart size and mediastinal contours are within normal limits. Low lung volumes with prominent perihilar bronchovascular markings. Some linear scarring or subsegmental atelectasis in both lung bases. Visualized bones unremarkable.  Cholecystectomy clips. IMPRESSION: 1. No pneumothorax post left thoracentesis, with improvement in left lower lung consolidation/atelectasis. 2. Small residual bilateral pleural effusions. Electronically Signed   By: Lucrezia Europe M.D.   On: 08/15/2017 10:48   Dg Chest Portable 1 View  Result Date: 08/15/2017 CLINICAL DATA:  Breast cancer with gastric metastases. Right-sided abdominal pain.  Shortness of breath. EXAM: PORTABLE CHEST 1 VIEW COMPARISON:  07/30/2017 FINDINGS: Shallow inspiration. Cardiac enlargement. Pulmonary vascularity is normal. There is increasing left and developing right pleural effusion with basilar  atelectasis or consolidation. Interstitial pattern to the lungs could reflect interstitial edema or interstitial metastasis. No pneumothorax. Mediastinal contours appear intact. IMPRESSION: Cardiac enlargement. Increasing left and developing right pleural effusion with basilar atelectasis or consolidation. Interstitial pattern to the lungs may indicate interstitial edema or metastasis. Electronically Signed   By: Lucienne Capers M.D.   On: 08/15/2017 01:26   US Thoracentesis Asp Pleural Space W/img Guide  Result Date: 08/15/2017 INDICATION: Bilateral pleural effusions.  Metastatic breast carcinoma. EXAM: ULTRASOUND GUIDED LEFT THORACENTESIS MEDICATIONS: LIDOCAINE 1% SUBCUTANEOUS COMPLICATIONS: None immediate. PROCEDURE: An ultrasound guided thoracentesis was thoroughly discussed with the patient and questions answered. The benefits, risks, alternatives and complications were also discussed. The patient understands and wishes to proceed with the procedure. Written consent was obtained. Survey ultrasound demonstrated bilateral pleural effusions, left greater than right. Ultrasound was performed to localize and mark an adequate pocket of fluid in the left chest. The area was then prepped and draped in the normal sterile fashion. 1% Lidocaine was used for local anesthesia. Under ultrasound guidance a Safe-T-Centesis catheter was introduced. Thoracentesis was performed. The catheter was removed and a dressing applied. FINDINGS: A total of approximately 500 mL of clear amber fluid was removed. Samples were sent to the laboratory as requested by the clinical team. IMPRESSION: Successful ultrasound guided left thoracentesis yielding 500 mL of pleural fluid. Follow-up chest radiograph shows  no pneumothorax. Electronically Signed   By: Lucrezia Europe M.D.   On: 08/15/2017 10:45    EKG:   Orders placed or performed in visit on 08/15/17  . EKG 12-Lead    ASSESSMENT AND PLAN:   77 year old female with past medical history significant for metastatic breast cancer diagnosed in June 2018, status post bilateral mastectomy #2 start chemotherapy with omental carcinomatosis, GERD, depression presents to hospital secondary to worsening abdominal pain and nausea and poor appetite.  1. Abdominal pain- likely secondary to omental carcinomatosis. -Recent CT-guided omental biopsy confirming metastatic adenocarcinoma. -History of breast cancer diagnosed last year. -Oncology consulted. -Paracentesis attempted but unable to drain more fluid. -Pain control advised. -Started on fentanyl patch as outpatient recently.  Added OxyContin today.  Continue Dilaudid IV for severe pain  2.  Metastatic breast cancer-diagnosed in June 2018, status post bilateral mastectomy.  Patient has refused chemo and radiation at the time. -Now with omental carcinomatosis and elevated tumor markers -Omental biopsy confirming adenocarcinoma. -We will need genetic testing as outpatient -Oncology consulted.  About to start chemotherapy next week -left sided thoracentesis with 500 mL fluid removed  3.  Hyponatremia-receiving IV fluids.  Continue to monitor  4.  Poor oral intake-likely secondary to underlying cancer.  Dietitian consulted  5. GERD- protonix  6. DVT Prophylaxis- lovenox  Updated daughter at bedside Overall poor prognosis   All the records are reviewed and case discussed with Care Management/Social Workerr. Management plans discussed with the patient, family and they are in agreement.  CODE STATUS: Full Code  TOTAL TIME TAKING CARE OF THIS PATIENT: 37 minutes.   POSSIBLE D/C IN 2 DAYS, DEPENDING ON CLINICAL CONDITION.   Nykerria Macconnell M.D on 08/15/2017 at 1:36 PM  Between 7am to 6pm -  Pager - [phone]  After 6pm go to [email or web address] - password [email or web address] [facility]  Office  [phone]  CC: Primary care physician; Mar Daring, PA-C

## 2017-08-15 NOTE — ED Notes (Signed)
Patient to CT and will bring patient to floor when back from CT

## 2017-08-15 NOTE — ED Triage Notes (Addendum)
Pt has breast cancer with mets to stomach and today started having right sided abd pain. Pt going through testing now to determine where it has metastasized to. Pt states pain starts under right breast and radiates to right abd with shob.

## 2017-08-15 NOTE — Patient Instructions (Signed)

## 2017-08-15 NOTE — ED Provider Notes (Signed)
[facility] Emergency Department Provider Note    First MD Initiated Contact with Patient 08/15/17 [phone]     (approximate)  I have reviewed the triage vital signs and the nursing notes.   HISTORY  Chief Complaint Abdominal Pain    HPI Norma Johns is a 77 y.o. female below list of chronic medical conditions including metastatic cancer of presumed breast etiology density emergency department with 10 out of 10 right chest and right-sided abdominal pain unrelieved with oxycodone at home.  Patient also admits to dyspnea, nausea vomiting with inability to tolerate p.o.  Patient denies any fever.  Patient denies any diarrhea or constipation.  Past Medical History:  Diagnosis Date  . Anemia   . Anxiety   . Arthritis   . Asthma    WELL CONTROLLED  . Breast cancer ([place]) 2018   left breast cnacer  . Cancer ([place])    Bilat Mastectomy  . Depression   . GERD (gastroesophageal reflux disease)   . Headache    H/O  . Hyperlipidemia   . Metastatic breast cancer ([place]) 07/25/2017  . Metastatic breast cancer ([place]) 07/25/2017  . Sleep apnea    DOES NOT USE CPAP-COULD NOT TOLERATE  . Stroke [facility]) 2004    Patient Active Problem List   Diagnosis Date Noted  . Acute hypoxemic respiratory failure ([place]) 08/15/2017  . Peritoneal carcinomatosis ([place]) 08/14/2017  . Neoplasm related pain 08/14/2017  . Metastatic breast cancer ([place]) 07/25/2017  . Goals of care, counseling/discussion 07/22/2017  . MRSA (methicillin resistant staph aureus) culture positive 04/05/2017  . Lymphedema 02/27/2017  . Shoulder weakness 02/27/2017  . Breast cancer ([place]) 10/01/2016  . Invasive lobular carcinoma of breast, stage 2, left ([place]) 09/18/2016  . Malignant neoplasm of lower-outer quadrant of left breast of female, estrogen receptor positive ([place]) 08/25/2016  . History of CVA with residual deficit 04/17/2016  . Low back pain 06/13/2015  . Anxiety 08/24/2014  . Atrophic vaginitis  08/24/2014  . Body mass index (BMI) of 29.0-29.9 in adult 08/24/2014  . Cyst of nasal sinus 08/24/2014  . Degenerative joint disease 08/24/2014  . OP (osteoporosis) 08/24/2014  . Bulging eyes 08/24/2014  . GERD (gastroesophageal reflux disease) 08/16/2014  . History of colon polyps 09/21/2008  . Chronic recurrent sinusitis 09/17/2008  . Diabetes mellitus, type 2 ([place]) 08/03/2008  . Hypercholesteremia 06/22/2008  . Cannot sleep 03/23/2008  . Allergic rhinitis 01/08/2008  . Airway hyperreactivity 01/08/2008  . Carotid artery obstruction 01/08/2008  . Clinical depression 01/08/2008  . Benign hypertension 01/08/2008    Past Surgical History:  Procedure Laterality Date  . ABDOMINAL HYSTERECTOMY    . BLADDER REPAIR    . BREAST BIOPSY Right 03/23/1991   Fibroadenoma with intramammary lymph node. Right breast, 9:00.  Marland Kitchen CHOLECYSTECTOMY    . COLONOSCOPY  2005  . MASTECTOMY MODIFIED RADICAL Left 10/01/2016   Procedure: MASTECTOMY MODIFIED RADICAL;  Surgeon: Robert Bellow, MD;  Location: [facility] ORS;  Service: General;  Laterality: Left;  . MR MRA CAROTID  02/2010   Minimal plaque formation; no significant stenosis. Sx: Syncope  . [place] [facility]  . SENTINEL NODE BIOPSY Right 10/01/2016   Procedure: SENTINEL NODE BIOPSY;  Surgeon: Robert Bellow, MD;  Location: [facility] ORS;  Service: General;  Laterality: Right;  . SIMPLE MASTECTOMY WITH AXILLARY SENTINEL NODE BIOPSY Right 10/01/2016   Procedure: SIMPLE MASTECTOMY;  Surgeon: Robert Bellow, MD;  Location: [facility] ORS;  Service: General;  Laterality: Right;    Prior to Admission medications   Medication Sig Start Date End Date Taking? Authorizing Provider  albuterol (PROVENTIL HFA;VENTOLIN HFA) 108 (90 Base) MCG/ACT inhaler Inhale 2 puffs into the lungs every 4 (four) hours as needed for wheezing or shortness of breath. 06/05/17  Yes Mar Daring, PA-C  ALPRAZolam Duanne Moron) 0.5 MG tablet Take 1 tablet (0.5 mg  total) by mouth at bedtime as needed for anxiety. 03/21/17  Yes Mar Daring, PA-C  fentaNYL (DURAGESIC - DOSED MCG/HR) 25 MCG/HR patch Place 1 patch (25 mcg total) onto the skin every 3 (three) days. 08/12/17  Yes Burns, Wandra Feinstein, NP  fexofenadine (ALLEGRA) 180 MG tablet Take 180 mg by mouth daily. PRN   Yes [provider]  fluticasone (FLONASE) 50 MCG/ACT nasal spray Place 2 sprays into both nostrils daily. Patient taking differently: Place 2 sprays into both nostrils daily as needed for allergies.  06/05/17  Yes Mar Daring, PA-C  Multiple Vitamin (MULTIVITAMIN) capsule Take 1 capsule by mouth daily.   Yes [provider]  ondansetron (ZOFRAN ODT) 4 MG disintegrating tablet Take 1 tablet (4 mg total) by mouth every 8 (eight) hours as needed for nausea or vomiting. 08/11/17  Yes Merlyn Lot, MD  oxyCODONE (OXY IR/ROXICODONE) 5 MG immediate release tablet Take 1 tablet (5 mg total) by mouth every 4 (four) hours as needed for severe pain. 07/31/17  Yes Earlie Server, MD  pantoprazole (PROTONIX) 40 MG tablet Take 1 tablet (40 mg total) by mouth 2 (two) times daily before a meal. 07/03/17  Yes Burnette, Anderson [place] M, PA-C  senna-docusate (SENNA S) 8.6-50 MG tablet Take 2 tablets by mouth daily. 07/31/17  Yes Earlie Server, MD  vitamin B-12 (CYANOCOBALAMIN) 500 MCG tablet Take 500 mcg by mouth daily.    Yes [provider]  oxyCODONE ER (XTAMPZA ER) 9 MG C12A Take 9 mg by mouth every 12 (twelve) hours. Patient not taking: Reported on 08/14/2017 08/01/17   Earlie Server, MD  prochlorperazine (COMPAZINE) 10 MG tablet Take 1 tablet (10 mg total) by mouth every 6 (six) hours as needed for nausea or vomiting. Patient not taking: Reported on 08/15/2017 08/12/17   Jacquelin Hawking, NP  triamcinolone cream (KENALOG) 0.1 % Apply 1 application topically 2 (two) times daily. Patient not taking: Reported on 08/14/2017 06/11/17   Mar Daring, PA-C    Allergies Chocolate; Other; Sulfa  antibiotics; Atorvastatin; Minocycline hcl; Naproxen; Niacin; Septra [sulfamethoxazole-trimethoprim]; Vioxx [rofecoxib]; and Penicillins  Family History  Problem Relation Age of Onset  . Cancer Father   . Prostate cancer Father   . Heart attack Mother   . Hypertension Mother   . CAD Mother   . Hypertension Son   . Diabetes Son   . Lung cancer Brother   . Leukemia Brother   . Lung cancer Brother   . Prostate cancer Brother   . Breast cancer Neg Hx     Social History Social History   Tobacco Use  . Smoking status: Never Smoker  . Smokeless tobacco: Never Used  Substance Use Topics  . Alcohol use: No  . Drug use: No    Review of Systems Constitutional: No fever/chills Eyes: No visual changes. ENT: No sore throat. Cardiovascular: Positive for chest pain. Respiratory: Positive for shortness of breath. Gastrointestinal: No abdominal pain.  No nausea, no vomiting.  No diarrhea.  No constipation. Genitourinary: Negative for dysuria. Musculoskeletal: Negative for neck pain.  Negative for back pain. Integumentary: Negative  for rash. Neurological: Negative for headaches, focal weakness or numbness.  ____________________________________________   PHYSICAL EXAM:  VITAL SIGNS: ED Triage Vitals  Enc Vitals Group     BP 08/15/17 0027 (!) 130/54     Pulse Rate 08/15/17 0027 99     Resp 08/15/17 0027 18     Temp 08/15/17 0027 99.3 F (37.4 C)     Temp Source 08/15/17 0027 Oral     SpO2 08/15/17 0027 (!) 86 %     Weight 08/15/17 0028 77.1 kg (170 lb)     Height 08/15/17 0028 1.6 m (5\' 3" )     Head Circumference --      Peak Flow --      Pain Score 08/15/17 0028 10     Pain Loc --      Pain Edu? --      Excl. in [place]? --     Constitutional: Alert and oriented.  Apparent discomfort  eyes: Conjunctivae are normal.  Head: Atraumatic. Mouth/Throat: Mucous membranes are moist.  Oropharynx non-erythematous. Neck: No stridor.  Cardiovascular: Normal rate, regular rhythm.  Good peripheral circulation. Grossly normal heart sounds. Respiratory: Normal respiratory effort.  No retractions. Lungs CTAB. Gastrointestinal: Soft and nontender. No distention.  Musculoskeletal: No lower extremity tenderness nor edema. No gross deformities of extremities. Neurologic:  Normal speech and language. No gross focal neurologic deficits are appreciated.  Skin:  Skin is warm, dry and intact. No rash noted. Psychiatric: Mood and affect are normal. Speech and behavior are normal.  ____________________________________________   LABS (all labs ordered are listed, but only abnormal results are displayed)  Labs Reviewed  CBC - Abnormal; Notable for the following components:      Result Value   WBC 12.3 (*)    Platelets 451 (*)    All other components within normal limits  COMPREHENSIVE METABOLIC PANEL - Abnormal; Notable for the following components:   Sodium 130 (*)    Chloride 98 (*)    Glucose, Bld 165 (*)    All other components within normal limits   ____________________________________________  EKG  ED ECG REPORT I, [place] N Jimi Schappert, the attending physician, personally viewed and interpreted this ECG.   Date: 08/15/2017  EKG Time: 12:43 AM  Rate: 89  Rhythm: Normal sinus rhythm  Axis: Normal  Intervals: Normal  ST&T Change: None  ____________________________________________  RADIOLOGY I, [place] N Keeli Roberg, personally viewed and evaluated these images (plain radiographs) as part of my medical decision making, as well as reviewing the written report by the radiologist.  ED MD interpretation: Bilateral pleural effusion noted on chest x-ray  Official radiology report(s): Ct Chest W Contrast  Result Date: 08/15/2017 CLINICAL DATA:  77 y/o F; history of breast cancer with metastasis to the stomach presenting with right-sided abdominal pain. EXAM: CT CHEST WITH CONTRAST TECHNIQUE: Multidetector CT imaging of the chest was performed during intravenous contrast  administration. CONTRAST:  173mL ISOVUE-370 IOPAMIDOL (ISOVUE-370) INJECTION 76% COMPARISON:  07/30/2017 CT of the chest. 08/11/2017 CT abdomen and pelvis. FINDINGS: Cardiovascular: No significant vascular findings. Normal heart size. No pericardial effusion. Mediastinum/Nodes: No lymphadenopathy by imaging criteria. Thyroid gland, trachea, and esophagus demonstrate no significant findings. Lungs/Pleura: There are several stable subcentimeter pulmonary nodules at the periphery of the right upper lobe and middle lobe. Interval development of consolidations within the lower lobes bilaterally, hazy ground-glass opacities of the upper lobes, and interlobular septal thickening. Increased moderate bilateral pleural effusions. Upper Abdomen: Small volume of ascites. Prominent gastrohepatic lymph nodes better  characterized on prior CT of abdomen and pelvis. Cholecystectomy. Musculoskeletal: No chest wall abnormality. No acute or significant osseous findings. IMPRESSION: 1. Increased moderate bilateral pleural effusions, hazy ground-glass opacities of the lungs, and smooth interlobular septal thickening. Findings probably represent pulmonary edema. 2. Dependent lower lobe consolidations probably represent atelectasis. Underlying pneumonia is possible. 3. Several stable subcentimeter pulmonary nodules at the periphery of the right upper lobe and middle lobe. Lower lobe nodules are obscured by consolidation. Consider reassessment for pulmonary metastatic disease after resolution of acute airspace disease and effusions. Electronically Signed   By: Kristine Garbe M.D.   On: 08/15/2017 03:07   Dg Chest Portable 1 View  Result Date: 08/15/2017 CLINICAL DATA:  Breast cancer with gastric metastases. Right-sided abdominal pain. Shortness of breath. EXAM: PORTABLE CHEST 1 VIEW COMPARISON:  07/30/2017 FINDINGS: Shallow inspiration. Cardiac enlargement. Pulmonary vascularity is normal. There is increasing left and  developing right pleural effusion with basilar atelectasis or consolidation. Interstitial pattern to the lungs could reflect interstitial edema or interstitial metastasis. No pneumothorax. Mediastinal contours appear intact. IMPRESSION: Cardiac enlargement. Increasing left and developing right pleural effusion with basilar atelectasis or consolidation. Interstitial pattern to the lungs may indicate interstitial edema or metastasis. Electronically Signed   By: Lucienne Capers M.D.   On: 08/15/2017 01:26    ____________________________________________    Procedures   ____________________________________________   INITIAL IMPRESSION / ASSESSMENT AND PLAN / ED COURSE  As part of my medical decision making, I reviewed the following data within the electronic MEDICAL RECORD NUMBER   77 year old female presented with above stated history and physical exam secondary to right-sided abdominal and chest pain.  Review of the patient's chart revealed recent CT scan that revealed peritoneal carcinomatosis with omental caking as well as bilateral pleural effusions.  Concern for possible malignant effusion with diaphragmatic irritation causing the patient's discomfort given hypoxia and increased work of breathing.  Patient's oxygen saturation 95% on my arrival to the room.  Patient was given 2 doses of IV morphine 2 mg increments with improvement of pain with current pain score 4 out of 10.  I spoke with the patient's colleges Dr. Tasia Catchings who agreed with plan for admission for possible therapeutic thoracentesis.  Patient discussed with Dr. Jodell Cipro for hospital admission for further evaluation and management ____________________________________________  FINAL CLINICAL IMPRESSION(S) / ED DIAGNOSES  Final diagnoses:  Pleural effusion  Abdominal carcinomatosis ([place])  Ascites     MEDICATIONS GIVEN DURING THIS VISIT:  Medications  ondansetron (ZOFRAN) injection 4 mg (has no administration in time range)    senna-docusate (Senokot-S) tablet 1 tablet (has no administration in time range)  bisacodyl (DULCOLAX) EC tablet 5 mg (has no administration in time range)  acetaminophen (TYLENOL) tablet 650 mg (has no administration in time range)    Or  acetaminophen (TYLENOL) suppository 650 mg (has no administration in time range)  ALPRAZolam (XANAX) tablet 0.5 mg (has no administration in time range)  fentaNYL (DURAGESIC - dosed mcg/hr) patch 25 mcg (has no administration in time range)  pantoprazole (PROTONIX) EC tablet 40 mg (has no administration in time range)  vitamin B-12 (CYANOCOBALAMIN) tablet 500 mcg (has no administration in time range)  albuterol (PROVENTIL) (2.5 MG/3ML) 0.083% nebulizer solution 2.5 mg (has no administration in time range)  0.9 %  sodium chloride infusion ( Intravenous New Bag/Given 08/15/17 0411)  HYDROmorphone (DILAUDID) injection 1 mg (1 mg Intravenous Given 08/15/17 0329)    Or  HYDROmorphone (DILAUDID) injection 2 mg ( Intravenous See Alternative 08/15/17  0329)  enoxaparin (LOVENOX) injection 40 mg (has no administration in time range)  multivitamin with minerals tablet 1 tablet (has no administration in time range)  promethazine (PHENERGAN) injection 12.5 mg (12.5 mg Intravenous Given 08/15/17 0358)  ondansetron (ZOFRAN) injection 4 mg (4 mg Intravenous Given 08/15/17 0047)  morphine 2 MG/ML injection 2 mg (2 mg Intravenous Given 08/15/17 0048)  ondansetron (ZOFRAN) injection 4 mg ( Intravenous Not Given 08/15/17 0333)  sodium chloride 0.9 % bolus 1,000 mL (0 mLs Intravenous Stopped 08/15/17 0212)  morphine 2 MG/ML injection 2 mg (2 mg Intravenous Given 08/15/17 0204)  iopamidol (ISOVUE-370) 76 % injection 100 mL (100 mLs Intravenous Contrast Given 08/15/17 0234)     ED Discharge Orders    None       Note:  This document was prepared using Dragon voice recognition software and may include unintentional dictation errors.    Gregor Hams, MD 08/15/17 [phone]

## 2017-08-15 NOTE — Procedures (Signed)
  Procedure: [place] L thora   EBL:   minimal Complications:  none immediate  See full dictation in [facility].  Dillard Cannon MD Main # [phone] Pager  [phone]

## 2017-08-15 NOTE — Telephone Encounter (Signed)
Dr. Tasia Catchings is referring Ms. Stampley for genetic counseling due to a personal and family history of cancer.  I tried reaching Ms. Lutzke today, but she is currently in the hospital. Her VM is not set up on the number listed in Epic so no message was left. I did leave a message on her daughter's cell number listed in the system. Her blood was already drawn for genetic testing and sent to [facility]. Her sample will be held there until we can speak.    Steele Berg, [place], [place] Genetic Counselor Phone: [phone]

## 2017-08-15 NOTE — ED Notes (Signed)
Patient placed on 2L [place] due to sats in high 80's and sating 97% with 2L

## 2017-08-15 NOTE — Plan of Care (Signed)

## 2017-08-15 NOTE — H&P (Addendum)
[facility] [place] NAME: Norma Johns    MR#:  [id number]  DATE OF BIRTH:  24-Sep-1940  DATE OF ADMISSION:  08/15/2017  PRIMARY CARE PHYSICIAN: Mar Daring, PA-C   REQUESTING/REFERRING PHYSICIAN: Gregor Hams, MD  CHIEF COMPLAINT:   Chief Complaint  Patient presents with  . Abdominal Pain    HISTORY OF PRESENT ILLNESS:  Norma Johns  is a 77 y.o. female with a known history of peritoneal carcinomatosis p/w CP + SOB + AP x2-4wks. Hx from pt and family. Pt w/ Hx of breast Ca in 2018. Pt apparently first developed CP and AP ~4wks ago. AP is RUQ + diffuse, CP is diffuse R-sided, radiating to R axilla/R back. Pain is described as "flashes of light", unable to characterize further. Pains occur randomly, are non-positional, non-pleuritic, non-exertional, non-reproducible. SOB started mild, and has progressively worsened x1-2wks. Pt also endorses decreased appetite, gas/bloating, decreased PO intake to food and fluids and progressive abdominal distension. She denies nausea/vomiting. She states she has been evaluated with CT-guided biopsy and imaging, and was told that she has "spots on the lung" as well as intraabdominal metastasis/carcinomatosis. She says it is not clear if the primary is breast, GI or ovarian (pt w/ Hx of breast Ca). She was scheduled for a outpt paracentesis on Friday 06/21. 06/16 CT A/P (+) ascites, moderate B/L pleural effusions (L > R). CXR on present admission (+) B/L pleural effusions. Hypoxemic to 85% on ED arrival, improved to 95% w/ 2L [place]. Pt w/ mild distress 2/2 pain on exam, not in respiratory distress. Exam (+) bibasilar crackles, abdominal distension.  Pathology (from 08/07/2017 CT-guided core Bx of anterior omental mass): " DIAGNOSIS:  A. OMENTUM, ANTERIOR; CT-GUIDED CORE BIOPSY:  - METASTATIC ADENOCARCINOMA WITH EXTRACELLULAR MUCIN PRODUCTION.   Comment:  Moderately differentiated adenocarcinoma is  present, with gland formation and deposits of extracellular mucin. Immunohistochemistry (IHC) was performed for further characterization. The neoplastic cells are positive for CDX2, with about 60% of cells showing moderate to strong nuclear staining. The cells are negative for GATA3 and PAX8. The clinical history, histomorphology, and IHC profile are most consistent with adenocarcinoma of gastrointestinal or pancreatobiliary origin. Breast origin is unlikely since the omental metastasis is GATA3 negative, and it has a different histologic appearance from the patient's previous mammary carcinoma, which was reviewed [phone], 10/01/2016). Mucinous carcinoma of gynecologic origin is not excluded by the IHC profile, but seems less likely given the previous hysterectomy and absence of adnexal mass. "  Oncologist is Dr. Earlie Server.  PAST MEDICAL HISTORY:   Past Medical History:  Diagnosis Date  . Anemia   . Anxiety   . Arthritis   . Asthma    WELL CONTROLLED  . Breast cancer ([place]) 2018   left breast cnacer  . Cancer ([place])    Bilat Mastectomy  . Depression   . GERD (gastroesophageal reflux disease)   . Headache    H/O  . Hyperlipidemia   . Metastatic breast cancer ([place]) 07/25/2017  . Metastatic breast cancer ([place]) 07/25/2017  . Sleep apnea    DOES NOT USE CPAP-COULD NOT TOLERATE  . Stroke [facility]) 2004    PAST SURGICAL HISTORY:   Past Surgical History:  Procedure Laterality Date  . ABDOMINAL HYSTERECTOMY    . BLADDER REPAIR    . BREAST BIOPSY Right 03/23/1991   Fibroadenoma with intramammary lymph node. Right breast, 9:00.  [facility] CHOLECYSTECTOMY    . COLONOSCOPY  2005  .  MASTECTOMY MODIFIED RADICAL Left 10/01/2016   Procedure: MASTECTOMY MODIFIED RADICAL;  Surgeon: Robert Bellow, MD;  Location: [facility] ORS;  Service: General;  Laterality: Left;  . MR MRA CAROTID  02/2010   Minimal plaque formation; no significant stenosis. Sx: Syncope  . [place] [facility]  . SENTINEL  NODE BIOPSY Right 10/01/2016   Procedure: SENTINEL NODE BIOPSY;  Surgeon: Robert Bellow, MD;  Location: [facility] ORS;  Service: General;  Laterality: Right;  . SIMPLE MASTECTOMY WITH AXILLARY SENTINEL NODE BIOPSY Right 10/01/2016   Procedure: SIMPLE MASTECTOMY;  Surgeon: Robert Bellow, MD;  Location: [facility] ORS;  Service: General;  Laterality: Right;    SOCIAL HISTORY:   Social History   Tobacco Use  . Smoking status: Never Smoker  . Smokeless tobacco: Never Used  Substance Use Topics  . Alcohol use: No    FAMILY HISTORY:   Family History  Problem Relation Age of Onset  . Cancer Father   . Prostate cancer Father   . Heart attack Mother   . Hypertension Mother   . CAD Mother   . Hypertension Son   . Diabetes Son   . Lung cancer Brother   . Leukemia Brother   . Lung cancer Brother   . Prostate cancer Brother   . Breast cancer Neg Hx     DRUG ALLERGIES:   Allergies  Allergen Reactions  . Chocolate Shortness Of Breath  . Other Shortness Of Breath and Swelling    PT STATES ALLERGY TO "SOME TYPE OF DYE THAT WAS USED AT [facility]."  SWELLING TO ARM AND SOB.  PT CANNOT REMEMBER WHAT THE DYE WAS USED FOR.  . Sulfa Antibiotics Other (See Comments)    unknown  . Atorvastatin Other (See Comments)    Leg pain   . Minocycline Hcl Itching  . Naproxen Other (See Comments)    Indigestion  . Niacin Other (See Comments)    Felt like she was on fire  . Septra [Sulfamethoxazole-Trimethoprim] Other (See Comments)    unknown  . Vioxx [Rofecoxib] Other (See Comments)    unknown  . Penicillins Itching and Rash    Has patient had a PCN reaction causing immediate rash, facial/tongue/throat swelling, SOB or lightheadedness with hypotension: Unknown Has patient had a PCN reaction causing severe rash involving mucus membranes or skin necrosis: Unknown Has patient had a PCN reaction that required hospitalization: No Has patient had a PCN reaction occurring within the last 10  years: No If all of the above answers are "NO", then may proceed with Cephalosporin use.     REVIEW OF SYSTEMS:   Review of Systems  Constitutional: Negative for chills, diaphoresis, fever, malaise/fatigue and weight loss.  HENT: Negative for congestion, ear pain, hearing loss, nosebleeds, sinus pain, sore throat and tinnitus.   Eyes: Negative for blurred vision, double vision and photophobia.  Respiratory: Positive for shortness of breath. Negative for cough, hemoptysis, sputum production and wheezing.   Cardiovascular: Positive for chest pain. Negative for palpitations, orthopnea, claudication, leg swelling and PND.  Gastrointestinal: Positive for abdominal pain. Negative for blood in stool, constipation, diarrhea, heartburn, melena, nausea and vomiting.  Genitourinary: Negative for dysuria, frequency, hematuria and urgency.  Musculoskeletal: Positive for back pain. Negative for joint pain, myalgias and neck pain.  Skin: Negative for itching and rash.  Neurological: Negative for dizziness, tingling, tremors, sensory change, speech change, focal weakness, seizures, loss of consciousness, weakness and headaches.  Psychiatric/Behavioral: Negative for memory  loss. The patient does not have insomnia.    MEDICATIONS AT HOME:   Prior to Admission medications   Medication Sig Start Date End Date Taking? Authorizing Provider  albuterol (PROVENTIL HFA;VENTOLIN HFA) 108 (90 Base) MCG/ACT inhaler Inhale 2 puffs into the lungs every 4 (four) hours as needed for wheezing or shortness of breath. 06/05/17  Yes Mar Daring, PA-C  ALPRAZolam Duanne Moron) 0.5 MG tablet Take 1 tablet (0.5 mg total) by mouth at bedtime as needed for anxiety. 03/21/17  Yes Mar Daring, PA-C  fentaNYL (DURAGESIC - DOSED MCG/HR) 25 MCG/HR patch Place 1 patch (25 mcg total) onto the skin every 3 (three) days. 08/12/17  Yes Burns, Wandra Feinstein, NP  fexofenadine (ALLEGRA) 180 MG tablet Take 180 mg by mouth daily. PRN   Yes  [provider]  fluticasone (FLONASE) 50 MCG/ACT nasal spray Place 2 sprays into both nostrils daily. Patient taking differently: Place 2 sprays into both nostrils daily as needed for allergies.  06/05/17  Yes Mar Daring, PA-C  Multiple Vitamin (MULTIVITAMIN) capsule Take 1 capsule by mouth daily.   Yes [provider]  ondansetron (ZOFRAN ODT) 4 MG disintegrating tablet Take 1 tablet (4 mg total) by mouth every 8 (eight) hours as needed for nausea or vomiting. 08/11/17  Yes Merlyn Lot, MD  oxyCODONE (OXY IR/ROXICODONE) 5 MG immediate release tablet Take 1 tablet (5 mg total) by mouth every 4 (four) hours as needed for severe pain. 07/31/17  Yes Earlie Server, MD  pantoprazole (PROTONIX) 40 MG tablet Take 1 tablet (40 mg total) by mouth 2 (two) times daily before a meal. 07/03/17  Yes Burnette, Anderson Malta M, PA-C  senna-docusate (SENNA S) 8.6-50 MG tablet Take 2 tablets by mouth daily. 07/31/17  Yes Earlie Server, MD  vitamin B-12 (CYANOCOBALAMIN) 500 MCG tablet Take 500 mcg by mouth daily.    Yes [provider]  oxyCODONE ER (XTAMPZA ER) 9 MG C12A Take 9 mg by mouth every 12 (twelve) hours. Patient not taking: Reported on 08/14/2017 08/01/17   Earlie Server, MD  prochlorperazine (COMPAZINE) 10 MG tablet Take 1 tablet (10 mg total) by mouth every 6 (six) hours as needed for nausea or vomiting. Patient not taking: Reported on 08/15/2017 08/12/17   Jacquelin Hawking, NP  triamcinolone cream (KENALOG) 0.1 % Apply 1 application topically 2 (two) times daily. Patient not taking: Reported on 08/14/2017 06/11/17   Mar Daring, PA-C      VITAL SIGNS:  Blood pressure (!) 126/91, pulse 79, temperature 99.3 F (37.4 C), temperature source Oral, resp. rate 18, height 5\' 3"  (1.6 m), weight 77.1 kg (170 lb), SpO2 100 %.  PHYSICAL EXAMINATION:  Physical Exam  Constitutional: She is oriented to person, place, and time. She appears well-developed and well-nourished. She is active and  cooperative.  Non-toxic appearance. She does not have a sickly appearance. She does not appear ill. She appears distressed (mild distress 2/2 pain). Nasal cannula in place.  HENT:  Head: Normocephalic and atraumatic.  Mouth/Throat: Oropharynx is clear and moist. No oropharyngeal exudate.  Eyes: Conjunctivae, EOM and lids are normal. No scleral icterus.  Neck: Neck supple. No JVD present. No thyromegaly present.  Cardiovascular: Normal rate, regular rhythm, S1 normal, S2 normal and normal heart sounds.  No extrasystoles are present. Exam reveals no gallop, no S3, no S4, no distant heart sounds and no friction rub.  No murmur heard. Pulmonary/Chest: Effort normal. No stridor. No respiratory distress. She has decreased breath sounds in  the right lower field and the left lower field. She has no wheezes. She has no rhonchi. She has rales in the right middle field, the right lower field, the left middle field and the left lower field.  Abdominal: Soft. Bowel sounds are normal. She exhibits distension. There is no tenderness. There is no rebound and no guarding.  Musculoskeletal: Normal range of motion. She exhibits no edema or tenderness.  Lymphadenopathy:    She has no cervical adenopathy.  Neurological: She is alert and oriented to person, place, and time. She is not disoriented.  Skin: Skin is warm, dry and intact. No rash noted. She is not diaphoretic. No erythema.  Psychiatric: She has a normal mood and affect. Her speech is normal and behavior is normal. Judgment and thought content normal. Cognition and memory are normal.   LABORATORY PANEL:   CBC Recent Labs  Lab 08/15/17 0043  WBC 12.3*  HGB 12.3  HCT 36.7  PLT 451*   ------------------------------------------------------------------------------------------------------------------  Chemistries  Recent Labs  Lab 08/15/17 0043  NA 130*  K 3.7  CL 98*  CO2 25  GLUCOSE 165*  BUN 10  CREATININE 0.66  CALCIUM 10.0  AST 21  ALT  23  ALKPHOS 66  BILITOT 0.5   ------------------------------------------------------------------------------------------------------------------  Cardiac Enzymes No results for input(s): TROPONINI in the last 168 hours. ------------------------------------------------------------------------------------------------------------------  RADIOLOGY:  Dg Chest Portable 1 View  Result Date: 08/15/2017 CLINICAL DATA:  Breast cancer with gastric metastases. Right-sided abdominal pain. Shortness of breath. EXAM: PORTABLE CHEST 1 VIEW COMPARISON:  07/30/2017 FINDINGS: Shallow inspiration. Cardiac enlargement. Pulmonary vascularity is normal. There is increasing left and developing right pleural effusion with basilar atelectasis or consolidation. Interstitial pattern to the lungs could reflect interstitial edema or interstitial metastasis. No pneumothorax. Mediastinal contours appear intact. IMPRESSION: Cardiac enlargement. Increasing left and developing right pleural effusion with basilar atelectasis or consolidation. Interstitial pattern to the lungs may indicate interstitial edema or metastasis. Electronically Signed   By: Lucienne Capers M.D.   On: 08/15/2017 01:26   IMPRESSION AND PLAN:   A/P: 34F CP/SOB, AP, ascites, B/L pleural effusions, acute hypoxemic respiratory failure. Suspect malignant etiology. Also w/ dehydration, hyponatremia, hyperglycemia, leukocytosis, thrombocytosis. -Exam (+) bibasilar crackles, abdominal distension -CXR (+) B/L pleural effusions -Prior CT A/P (08/11/2017) (+) ascites, moderate B/L pleural effusions (L > R) -CT chest pending -Symptoms most likely 2/2 pleural effusions + ascites -Technically SIRS (+) (hypoxia, leukocytosis), no clear source of infxn -Continuous pulse ox -Albumin WNL, Cr WNL, no known Hx heart disease -Pt ordered for IR paracentesis R chest (as pt symptomatic on R side), may also need L chest done -Paracentesis labwork/diagnostics ordered -U/S  abdomen ordered to evaluate ascites -Was to have outpt paracentesis on Friday, may be prudent to also complete paracentesis during inpt hospitalization -Symptomatic mgmt, pain ctrl, antiemetics -Symptoms likely to improve w/ thoracentesis/paracentesis -States she is scheduled for infusion port placement and chemo training -Dehydration w/ hypochloremic hyponatremia, IVF NS -Hyperglycemia, leukocytosis and thrombocytosis likely 2/2 malignancy + dehydration/hemoconcentration (most recent CBC indices increased compared to prior labs) -c/w home meds -Regular diet -Lovenox -Full code -Admission, > 2 midnights   All the records are reviewed and case discussed with ED provider. Management plans discussed with the patient, family and they are in agreement.  CODE STATUS: Full code.  TOTAL TIME TAKING CARE OF THIS PATIENT: 90 minutes.    Arta Silence M.D on 08/15/2017 at 2:28 AM  Between 7am to 6pm - Pager - [phone]  After 6pm  go to [email or web address] - password [email or web address] [facility]  [facility]  Office  [phone]  CC: Primary care physician; Mar Daring, PA-C   Note: This dictation was prepared with Dragon dictation along with smaller phrase technology. Any transcriptional errors that result from this process are unintentional.

## 2017-08-15 NOTE — Care Management Note (Signed)
Case Management Note  Patient Details  Name: Norma Johns MRN: [id number] Date of Birth: 01-Oct-1940  Subjective/Objective:  Admitted to [facility] with the diagnosis of acute hypoxemia. Lives with husband, Norma Johns. Daughter is Norma Johns [phone]). Last seen Fenton Malling about a month ago. Prescriptions are filled at [facility] on [address]. No home health. No skilled facility. No home oxygen. Rolling walker in the home. Self drinks, daughter helps with dressing, baths, and errands. No falls. Decreased intake and weight loss. Breast Cancer suppose to start treatment next week.                  Action/Plan: Received referral for help with medications. Discussed with daughter since her mother has a drug plan we could probably not help with medications. If she needs help with cancer related medications Norma Johns at the [facility] could help Will continue to follow for plans   Expected Discharge Date:                  Expected Discharge Plan:     In-House Referral:   yes  Discharge planning Services    yes Post Acute Care Choice:    Choice offered to:     DME Arranged:    DME Agency:     HH Arranged:    [place] Agency:     Status of Service:     If discussed at [employer] [facility] [employer], dates discussed:    Additional Comments:  Shelbie Ammons, RN MSN CCM Care Management [phone] 08/15/2017, 10:06 AM

## 2017-08-16 ENCOUNTER — Inpatient Hospital Stay: Payer: Medicare Other

## 2017-08-16 ENCOUNTER — Ambulatory Visit
Admission: RE | Admit: 2017-08-16 | Discharge: 2017-08-16 | Disposition: A | Discharge status: Home / Self Care | Payer: Medicare Other | Source: Ambulatory Visit | Attending: Oncology | Admitting: Oncology

## 2017-08-16 LAB — COMPREHENSIVE METABOLIC PANEL
ALK PHOS: 52 U/L (ref 38–126)
ALT: 16 U/L (ref 14–54)
AST: 15 U/L (ref 15–41)
Albumin: 2.8 g/dL — ABNORMAL LOW (ref 3.5–5.0)
Anion gap: 7 (ref 5–15)
BUN: 9 mg/dL (ref 6–20)
CO2: 24 mmol/L (ref 22–32)
Calcium: 8.8 mg/dL — ABNORMAL LOW (ref 8.9–10.3)
Chloride: 103 mmol/L (ref 101–111)
Creatinine, Ser: 0.58 mg/dL (ref 0.44–1.00)
Glucose, Bld: 136 mg/dL — ABNORMAL HIGH (ref 65–99)
Potassium: 4 mmol/L (ref 3.5–5.1)
SODIUM: 134 mmol/L — AB (ref 135–145)
Total Bilirubin: 0.6 mg/dL (ref 0.3–1.2)
Total Protein: 6.2 g/dL — ABNORMAL LOW (ref 6.5–8.1)

## 2017-08-16 LAB — MRSA PCR SCREENING: MRSA by PCR: NEGATIVE

## 2017-08-16 LAB — PH, BODY FLUID: pH, Body Fluid: 7.7

## 2017-08-16 MED ORDER — ALBUTEROL SULFATE (2.5 MG/3ML) 0.083% IN NEBU: 2.5000 mg | INHALATION_SOLUTION | Freq: Four times a day (QID) | RESPIRATORY_TRACT | Status: DC

## 2017-08-16 MED ORDER — GADOBENATE DIMEGLUMINE 529 MG/ML IV SOLN
15.0000 mL | Freq: Once | INTRAVENOUS | Status: AC | PRN
  Administered 2017-08-16: 15:00:00 15 mL via INTRAVENOUS

## 2017-08-16 MED ORDER — POLYETHYLENE GLYCOL 3350 17 G PO PACK
17.0000 g | PACK | Freq: Every day | ORAL | Status: DC
  Administered 2017-08-16 – 2017-08-19 (×4): 17 g via ORAL
  Filled 2017-08-16 (×5): qty 1

## 2017-08-16 MED ORDER — OXYCODONE HCL 5 MG PO TABS
10.0000 mg | ORAL_TABLET | ORAL | Status: DC | PRN
  Administered 2017-08-19 (×3): 10 mg via ORAL
  Filled 2017-08-16 (×3): qty 2

## 2017-08-16 MED ORDER — FENTANYL 25 MCG/HR TD PT72
37.5000 ug | MEDICATED_PATCH | TRANSDERMAL | Status: DC
  Administered 2017-08-16 – 2017-08-19 (×2): 37.5 ug via TRANSDERMAL
  Filled 2017-08-16 (×2): qty 1

## 2017-08-16 MED ORDER — ALBUTEROL SULFATE (2.5 MG/3ML) 0.083% IN NEBU
2.5000 mg | INHALATION_SOLUTION | RESPIRATORY_TRACT | Status: DC | PRN
  Administered 2017-08-19: 2.5 mg via RESPIRATORY_TRACT
  Filled 2017-08-16 (×2): qty 3

## 2017-08-16 MED ORDER — HYDROMORPHONE HCL 1 MG/ML IJ SOLN
1.0000 mg | INTRAMUSCULAR | Status: AC
  Administered 2017-08-16: 15:00:00 1 mg via INTRAVENOUS
  Filled 2017-08-16: qty 1

## 2017-08-16 MED ORDER — SENNOSIDES-DOCUSATE SODIUM 8.6-50 MG PO TABS
2.0000 | ORAL_TABLET | Freq: Every day | ORAL | Status: DC
  Administered 2017-08-17 – 2017-08-19 (×2): 2 via ORAL
  Filled 2017-08-16 (×4): qty 2

## 2017-08-16 MED ORDER — IPRATROPIUM-ALBUTEROL 0.5-2.5 (3) MG/3ML IN SOLN
3.0000 mL | Freq: Four times a day (QID) | RESPIRATORY_TRACT | Status: DC
  Administered 2017-08-17 – 2017-08-20 (×13): 3 mL via RESPIRATORY_TRACT
  Filled 2017-08-16 (×16): qty 3

## 2017-08-16 NOTE — Progress Notes (Signed)
[place] [facility] NAME: Norma Johns    MR#:  [id number]  DATE OF BIRTH:  1940/10/24  SUBJECTIVE:  CHIEF COMPLAINT:   Chief Complaint  Patient presents with  . Abdominal Pain   -Last fentanyl patch palpating today.  Now complains of left-sided chest pain. -For MRI of her abdomen today  REVIEW OF SYSTEMS:  Review of Systems  Constitutional: Positive for malaise/fatigue. Negative for chills and fever.       Loss of appetite  HENT: Negative for congestion, ear discharge, hearing loss and nosebleeds.   Eyes: Negative for blurred vision and double vision.  Respiratory: Negative for cough, shortness of breath and wheezing.   Cardiovascular: Negative for chest pain and palpitations.  Gastrointestinal: Positive for abdominal pain and nausea. Negative for constipation, diarrhea and vomiting.  Genitourinary: Negative for dysuria.  Musculoskeletal: Negative for myalgias.  Neurological: Negative for dizziness, focal weakness, seizures, weakness and headaches.  Psychiatric/Behavioral: Negative for depression.    DRUG ALLERGIES:   Allergies  Allergen Reactions  . Chocolate Shortness Of Breath  . Other Shortness Of Breath and Swelling    PT STATES ALLERGY TO "SOME TYPE OF DYE THAT WAS USED AT [facility]."  SWELLING TO ARM AND SOB.  PT CANNOT REMEMBER WHAT THE DYE WAS USED FOR.  . Sulfa Antibiotics Other (See Comments)    unknown  . Atorvastatin Other (See Comments)    Leg pain   . Minocycline Hcl Itching  . Naproxen Other (See Comments)    Indigestion  . Niacin Other (See Comments)    Felt like she was on fire  . Septra [Sulfamethoxazole-Trimethoprim] Other (See Comments)    unknown  . Vioxx [Rofecoxib] Other (See Comments)    unknown  . Penicillins Itching and Rash    Has patient had a PCN reaction causing immediate rash, facial/tongue/throat swelling, SOB or lightheadedness with hypotension: Unknown Has patient had a PCN  reaction causing severe rash involving mucus membranes or skin necrosis: Unknown Has patient had a PCN reaction that required hospitalization: No Has patient had a PCN reaction occurring within the last 10 years: No If all of the above answers are "NO", then may proceed with Cephalosporin use.     VITALS:  Blood pressure (!) 162/82, pulse 98, temperature 99.8 F (37.7 C), temperature source Oral, resp. rate 18, height 5\' 3"  (1.6 m), weight 79.5 kg (175 lb 4.3 oz), SpO2 90 %.  PHYSICAL EXAMINATION:  Physical Exam  GENERAL:  77 y.o.-year-old patient lying in the bed, in distress due to pain. EYES: Pupils equal, round, reactive to light and accommodation. No scleral icterus. Extraocular muscles intact.  HEENT: Head atraumatic, normocephalic. Oropharynx and nasopharynx clear.  NECK:  Supple, no jugular venous distention. No thyroid enlargement, no tenderness.  LUNGS: Normal breath sounds bilaterally, no wheezing, rales,rhonchi or crepitation. No use of accessory muscles of respiration. Significantly decreased bibasilar breath sounds CARDIOVASCULAR: S1, S2 normal. No rubs, or gallops. 2/6 systolic murmur present ABDOMEN: distended, firm, tender abdomen. Bowel sounds present. No organomegaly or mass.  EXTREMITIES: No pedal edema, cyanosis, or clubbing.  NEUROLOGIC: Cranial nerves II through XII are intact. Muscle strength 5/5 in all extremities. Sensation intact. Gait not checked.  PSYCHIATRIC: The patient is alert and oriented x 3.  SKIN: No obvious rash, lesion, or ulcer.    LABORATORY PANEL:   CBC Recent Labs  Lab 08/15/17 0043  WBC 12.3*  HGB 12.3  HCT 36.7  PLT 451*   ------------------------------------------------------------------------------------------------------------------  Chemistries  Recent Labs  Lab 08/16/17 0317  NA 134*  K 4.0  CL 103  CO2 24  GLUCOSE 136*  BUN 9  CREATININE 0.58  CALCIUM 8.8*  AST 15  ALT 16  ALKPHOS 52  BILITOT 0.6    ------------------------------------------------------------------------------------------------------------------  Cardiac Enzymes No results for input(s): TROPONINI in the last 168 hours. ------------------------------------------------------------------------------------------------------------------  RADIOLOGY:  Ct Chest W Contrast  Result Date: 08/15/2017 CLINICAL DATA:  77 y/o F; history of breast cancer with metastasis to the stomach presenting with right-sided abdominal pain. EXAM: CT CHEST WITH CONTRAST TECHNIQUE: Multidetector CT imaging of the chest was performed during intravenous contrast administration. CONTRAST:  158mL ISOVUE-370 IOPAMIDOL (ISOVUE-370) INJECTION 76% COMPARISON:  07/30/2017 CT of the chest. 08/11/2017 CT abdomen and pelvis. FINDINGS: Cardiovascular: No significant vascular findings. Normal heart size. No pericardial effusion. Mediastinum/Nodes: No lymphadenopathy by imaging criteria. Thyroid gland, trachea, and esophagus demonstrate no significant findings. Lungs/Pleura: There are several stable subcentimeter pulmonary nodules at the periphery of the right upper lobe and middle lobe. Interval development of consolidations within the lower lobes bilaterally, hazy ground-glass opacities of the upper lobes, and interlobular septal thickening. Increased moderate bilateral pleural effusions. Upper Abdomen: Small volume of ascites. Prominent gastrohepatic lymph nodes better characterized on prior CT of abdomen and pelvis. Cholecystectomy. Musculoskeletal: No chest wall abnormality. No acute or significant osseous findings. IMPRESSION: 1. Increased moderate bilateral pleural effusions, hazy ground-glass opacities of the lungs, and smooth interlobular septal thickening. Findings probably represent pulmonary edema. 2. Dependent lower lobe consolidations probably represent atelectasis. Underlying pneumonia is possible. 3. Several stable subcentimeter pulmonary nodules at the  periphery of the right upper lobe and middle lobe. Lower lobe nodules are obscured by consolidation. Consider reassessment for pulmonary metastatic disease after resolution of acute airspace disease and effusions. Electronically Signed   By: Kristine Garbe M.D.   On: 08/15/2017 03:07   US Abdomen Limited  Result Date: 08/15/2017 CLINICAL DATA:  Metastatic breast carcinoma. Abdominal carcinomatosis. Bilateral pleural effusions and ascites noted on recent CT chest. Paracentesis requested. EXAM: LIMITED ABDOMEN ULTRASOUND FOR ASCITES TECHNIQUE: Limited ultrasound survey for ascites was performed in all four abdominal quadrants. COMPARISON:  CT 08/15/2017 FINDINGS: There is a small amount of scattered ascites, predominantly in the low pelvis. No large pocket to allow safe paracentesis. IMPRESSION: 1. Small amount of scattered pelvic ascites. Findings discussed with patient. Paracentesis deferred. Electronically Signed   By: Lucrezia Europe M.D.   On: 08/15/2017 10:46   Dg Chest Port 1 View  Result Date: 08/15/2017 CLINICAL DATA:  S/p Thoracentesis- left side EXAM: PORTABLE CHEST - 1 VIEW COMPARISON:  the previous day's study FINDINGS: No pneumothorax. Small bilateral pleural effusions, decreased on the left since prior study. Improved aeration at the left lung base. Heart size and mediastinal contours are within normal limits. Low lung volumes with prominent perihilar bronchovascular markings. Some linear scarring or subsegmental atelectasis in both lung bases. Visualized bones unremarkable.  Cholecystectomy clips. IMPRESSION: 1. No pneumothorax post left thoracentesis, with improvement in left lower lung consolidation/atelectasis. 2. Small residual bilateral pleural effusions. Electronically Signed   By: Lucrezia Europe M.D.   On: 08/15/2017 10:48   Dg Chest Portable 1 View  Result Date: 08/15/2017 CLINICAL DATA:  Breast cancer with gastric metastases. Right-sided abdominal pain. Shortness of breath. EXAM:  PORTABLE CHEST 1 VIEW COMPARISON:  07/30/2017 FINDINGS: Shallow inspiration. Cardiac enlargement. Pulmonary vascularity is normal. There is increasing left and developing right pleural effusion with basilar atelectasis or consolidation. Interstitial pattern to  the lungs could reflect interstitial edema or interstitial metastasis. No pneumothorax. Mediastinal contours appear intact. IMPRESSION: Cardiac enlargement. Increasing left and developing right pleural effusion with basilar atelectasis or consolidation. Interstitial pattern to the lungs may indicate interstitial edema or metastasis. Electronically Signed   By: Lucienne Capers M.D.   On: 08/15/2017 01:26   US Thoracentesis Asp Pleural Space W/img Guide  Result Date: 08/15/2017 INDICATION: Bilateral pleural effusions.  Metastatic breast carcinoma. EXAM: ULTRASOUND GUIDED LEFT THORACENTESIS MEDICATIONS: LIDOCAINE 1% SUBCUTANEOUS COMPLICATIONS: None immediate. PROCEDURE: An ultrasound guided thoracentesis was thoroughly discussed with the patient and questions answered. The benefits, risks, alternatives and complications were also discussed. The patient understands and wishes to proceed with the procedure. Written consent was obtained. Survey ultrasound demonstrated bilateral pleural effusions, left greater than right. Ultrasound was performed to localize and mark an adequate pocket of fluid in the left chest. The area was then prepped and draped in the normal sterile fashion. 1% Lidocaine was used for local anesthesia. Under ultrasound guidance a Safe-T-Centesis catheter was introduced. Thoracentesis was performed. The catheter was removed and a dressing applied. FINDINGS: A total of approximately 500 mL of clear amber fluid was removed. Samples were sent to the laboratory as requested by the clinical team. IMPRESSION: Successful ultrasound guided left thoracentesis yielding 500 mL of pleural fluid. Follow-up chest radiograph shows no pneumothorax.  Electronically Signed   By: Lucrezia Europe M.D.   On: 08/15/2017 10:45    EKG:   Orders placed or performed in visit on 08/15/17  . EKG 12-Lead    ASSESSMENT AND PLAN:   77 year old female with past medical history significant for metastatic breast cancer diagnosed in June 2018, status post bilateral mastectomy #2 start chemotherapy with omental carcinomatosis, GERD, depression presents to hospital secondary to worsening abdominal pain and nausea and poor appetite.  1. Abdominal pain- likely secondary to omental carcinomatosis. -Recent CT-guided omental biopsy confirming metastatic adenocarcinoma.  Unknown primary -History of breast cancer diagnosed last year. -Oncology consulted. -Paracentesis attempted but unable to drain more fluid. -Pain control advised. -Started on fentanyl patch as outpatient recently.  Added OxyContin.  Continue Dilaudid IV for severe pain  2.  Metastatic breast cancer-diagnosed in June 2018, status post bilateral mastectomy.  Patient has refused chemo and radiation at the time. -Now with omental carcinomatosis and elevated tumor markers -Omental biopsy confirming metastatic adenocarcinoma.  Unknown primary -MRI of the abdomen ordered to see if the primary tumor can be diagnosed -Oncology consulted.  Plan to start chemotherapy next week-oncology input advised -left sided thoracentesis with 500 mL fluid removed  3.  Hyponatremia-received IV fluids.  Continue to monitor  4.  Poor oral intake-likely secondary to underlying cancer.  Dietitian consulted  5. GERD- protonix  6. DVT Prophylaxis- lovenox  Updated daughter at bedside Overall poor prognosis   All the records are reviewed and case discussed with Care Management/Social Workerr. Management plans discussed with the patient, family and they are in agreement.  CODE STATUS: Full Code  TOTAL TIME TAKING CARE OF THIS PATIENT: 38 minutes.   POSSIBLE D/C IN 2 DAYS, DEPENDING ON CLINICAL  CONDITION.   Gladstone Lighter M.D on 08/16/2017 at 2:15 PM  Between 7am to 6pm - Pager - [phone]  After 6pm go to [email or web address] - password [email or web address] [place] [facility]  Office  [phone]  CC: Primary care physician; Mar Daring, PA-C

## 2017-08-16 NOTE — Plan of Care (Signed)
  Problem: Education: Goal: Knowledge of General Education information will improve Outcome: Progressing   Problem: Health Behavior/Discharge Planning: Goal: Ability to manage health-related needs will improve Outcome: Progressing   Problem: Clinical Measurements: Goal: Ability to maintain clinical measurements within normal limits will improve Outcome: Progressing Goal: Will remain free from infection Outcome: Progressing Goal: Diagnostic test results will improve Outcome: Progressing Goal: Respiratory complications will improve Outcome: Progressing Goal: Cardiovascular complication will be avoided Outcome: Progressing   Problem: Activity: Goal: Risk for activity intolerance will decrease Outcome: Progressing   Problem: Nutrition: Goal: Adequate nutrition will be maintained Outcome: Progressing   Problem: Coping: Goal: Level of anxiety will decrease Outcome: Progressing   Problem: Elimination: Goal: Will not experience complications related to bowel motility Outcome: Progressing Goal: Will not experience complications related to urinary retention Outcome: Progressing   Problem: Pain Managment: Goal: General experience of comfort will improve Outcome: Progressing   Problem: Safety: Goal: Ability to remain free from injury will improve Outcome: Progressing   Problem: Skin Integrity: Goal: Risk for impaired skin integrity will decrease Outcome: Progressing   Problem: Education: Goal: Knowledge of the prescribed therapeutic regimen will improve Outcome: Progressing   Problem: Activity: Goal: Ability to implement measures to reduce episodes of fatigue will improve Outcome: Progressing   Problem: Bowel/Gastric: Goal: Will not experience complications related to bowel motility Outcome: Progressing   Problem: Coping: Goal: Ability to identify and develop effective coping behavior will improve Outcome: Progressing   Problem: Nutritional: Goal: Maintenance of  adequate nutrition will improve Outcome: Progressing

## 2017-08-16 NOTE — Progress Notes (Signed)
Hematology/Oncology Progress Note [facility] Telephone:([phone] [phone]  Patient Care Team: Rubye Beach as PCP - General (Family Medicine) Bary Castilla Forest Gleason, MD (General Surgery) Earlie Server, MD as Consulting Physician (Oncology)   Name of the patient: Norma Johns  [id number]  10/13/1940  Date of visit: 08/16/17   INTERVAL HISTORY-  Patient was seen and examined.  Family members at bedside. Reports having a good night sleep.  Pain is better controlled.   Review of systems- Review of Systems  Constitutional: Positive for malaise/fatigue. Negative for fever.  HENT: Negative for hearing loss and tinnitus.   Eyes: Negative for photophobia and pain.  Respiratory: Positive for shortness of breath. Negative for wheezing.   Cardiovascular: Negative for orthopnea.  Gastrointestinal: Positive for abdominal pain. Negative for nausea and vomiting.  Genitourinary: Negative for dysuria.  Musculoskeletal: Negative for myalgias.  Neurological: Negative for dizziness.  Endo/Heme/Allergies: Does not bruise/bleed easily.  Psychiatric/Behavioral: Negative for depression.    Allergies  Allergen Reactions  . Chocolate Shortness Of Breath  . Other Shortness Of Breath and Swelling    PT STATES ALLERGY TO "SOME TYPE OF DYE THAT WAS USED AT [facility]  SWELLING TO ARM AND SOB.  PT CANNOT REMEMBER WHAT THE DYE WAS USED FOR.  . Sulfa Antibiotics Other (See Comments)    unknown  . Atorvastatin Other (See Comments)    Leg pain   . Minocycline Hcl Itching  . Naproxen Other (See Comments)    Indigestion  . Niacin Other (See Comments)    Felt like she was on fire  . Septra [Sulfamethoxazole-Trimethoprim] Other (See Comments)    unknown  . Vioxx [Rofecoxib] Other (See Comments)    unknown  . Penicillins Itching and Rash    Has patient had a PCN reaction causing immediate rash, facial/tongue/throat swelling, SOB or lightheadedness  with hypotension: Unknown Has patient had a PCN reaction causing severe rash involving mucus membranes or skin necrosis: Unknown Has patient had a PCN reaction that required hospitalization: No Has patient had a PCN reaction occurring within the last 10 years: No If all of the above answers are "NO", then may proceed with Cephalosporin use.     Patient Active Problem List   Diagnosis Date Noted  . Acute hypoxemic respiratory failure ([place]) 08/15/2017  . Pleural effusion   . Abdominal carcinomatosis ([place])   . Malignant ascites   . Status post thoracentesis   . Peritoneal carcinomatosis ([place]) 08/14/2017  . Neoplasm related pain 08/14/2017  . Metastatic breast cancer ([place]) 07/25/2017  . Goals of care, counseling/discussion 07/22/2017  . MRSA (methicillin resistant staph aureus) culture positive 04/05/2017  . Lymphedema 02/27/2017  . Shoulder weakness 02/27/2017  . Breast cancer ([place]) 10/01/2016  . Invasive lobular carcinoma of breast, stage 2, left ([place]) 09/18/2016  . Malignant neoplasm of lower-outer quadrant of left breast of female, estrogen receptor positive ([place]) 08/25/2016  . History of CVA with residual deficit 04/17/2016  . Low back pain 06/13/2015  . Anxiety 08/24/2014  . Atrophic vaginitis 08/24/2014  . Body mass index (BMI) of 29.0-29.9 in adult 08/24/2014  . Cyst of nasal sinus 08/24/2014  . Degenerative joint disease 08/24/2014  . OP (osteoporosis) 08/24/2014  . Bulging eyes 08/24/2014  . GERD (gastroesophageal reflux disease) 08/16/2014  . History of colon polyps 09/21/2008  . Chronic recurrent sinusitis 09/17/2008  . Diabetes mellitus, type 2 ([place]) 08/03/2008  . Hypercholesteremia 06/22/2008  . Cannot sleep 03/23/2008  . Allergic rhinitis 01/08/2008  .  Airway hyperreactivity 01/08/2008  . Carotid artery obstruction 01/08/2008  . Clinical depression 01/08/2008  . Benign hypertension 01/08/2008     Past Medical History:  Diagnosis Date  . Anemia   . Anxiety    . Arthritis   . Asthma    WELL CONTROLLED  . Breast cancer ([place]) 2018   left breast cnacer  . Cancer ([place])    Bilat Mastectomy  . Depression   . GERD (gastroesophageal reflux disease)   . Headache    H/O  . Hyperlipidemia   . Metastatic breast cancer ([place]) 07/25/2017  . Metastatic breast cancer ([place]) 07/25/2017  . Sleep apnea    DOES NOT USE CPAP-COULD NOT TOLERATE  . Stroke [facility]) 2004     Past Surgical History:  Procedure Laterality Date  . ABDOMINAL HYSTERECTOMY    . BLADDER REPAIR    . BREAST BIOPSY Right 03/23/1991   Fibroadenoma with intramammary lymph node. Right breast, 9:00.  Marland Kitchen CHOLECYSTECTOMY    . COLONOSCOPY  2005  . MASTECTOMY MODIFIED RADICAL Left 10/01/2016   Procedure: MASTECTOMY MODIFIED RADICAL;  Surgeon: Robert Bellow, MD;  Location: [facility] ORS;  Service: General;  Laterality: Left;  . MR MRA CAROTID  02/2010   Minimal plaque formation; no significant stenosis. Sx: Syncope  . [place] [facility]  . SENTINEL NODE BIOPSY Right 10/01/2016   Procedure: SENTINEL NODE BIOPSY;  Surgeon: Robert Bellow, MD;  Location: [facility] ORS;  Service: General;  Laterality: Right;  . SIMPLE MASTECTOMY WITH AXILLARY SENTINEL NODE BIOPSY Right 10/01/2016   Procedure: SIMPLE MASTECTOMY;  Surgeon: Robert Bellow, MD;  Location: [facility] ORS;  Service: General;  Laterality: Right;    Social History   Socioeconomic History  . Marital status: Married    Spouse name: Not on file  . Number of children: 2  . Years of education: Not on file  . Highest education level: 12th grade  Occupational History  . Occupation: retired  Scientific laboratory technician  . Financial resource strain: Very hard  . Food insecurity:    Worry: Never true    Inability: Never true  . Transportation needs:    Medical: No    Non-medical: No  Tobacco Use  . Smoking status: Never Smoker  . Smokeless tobacco: Never Used  Substance and Sexual Activity  . Alcohol use: No  . Drug use: No  . Sexual  activity: Never  Lifestyle  . Physical activity:    Days per week: Not on file    Minutes per session: Not on file  . Stress: Very much  Relationships  . Social connections:    Talks on phone: Not on file    Gets together: Not on file    Attends religious service: Not on file    Active member of club or organization: Not on file    Attends meetings of clubs or organizations: Not on file    Relationship status: Not on file  . Intimate partner violence:    Fear of current or ex partner: No    Emotionally abused: No    Physically abused: No    Forced sexual activity: No  Other Topics Concern  . Not on file  Social History Narrative  . Not on file     Family History  Problem Relation Age of Onset  . Cancer Father   . Prostate cancer Father   . Heart attack Mother   . Hypertension Mother   . CAD Mother   .  Hypertension Son   . Diabetes Son   . Lung cancer Brother   . Leukemia Brother   . Lung cancer Brother   . Prostate cancer Brother   . Breast cancer Neg Hx      Current Facility-Administered Medications:  .  0.9 %  sodium chloride infusion, , Intravenous, Continuous, Sridharan, Prasanna, MD, Last Rate: 75 mL/hr at 08/16/17 0758 .  acetaminophen (TYLENOL) tablet 650 mg, 650 mg, Oral, Q6H PRN **OR** acetaminophen (TYLENOL) suppository 650 mg, 650 mg, Rectal, Q6H PRN, Jodell Cipro, Prasanna, MD .  albuterol (PROVENTIL) (2.5 MG/3ML) 0.083% nebulizer solution 2.5 mg, 2.5 mg, Nebulization, Q6H PRN, Jodell Cipro, Prasanna, MD, 2.5 mg at 08/16/17 0715 .  ALPRAZolam (XANAX) tablet 0.5 mg, 0.5 mg, Oral, QHS PRN, Arta Silence, MD, 0.5 mg at 08/16/17 1259 .  bisacodyl (DULCOLAX) EC tablet 5 mg, 5 mg, Oral, Daily PRN, Arta Silence, MD, 5 mg at 08/16/17 0943 .  enoxaparin (LOVENOX) injection 40 mg, 40 mg, Subcutaneous, Q24H, Jodell Cipro, Prasanna, MD, 40 mg at 08/16/17 0644 .  feeding supplement (ENSURE ENLIVE) (ENSURE ENLIVE) liquid 237 mL, 237 mL, Oral, BID BM, Gladstone Lighter, MD, 237 mL at 08/16/17 0944 .  fentaNYL (DURAGESIC - dosed mcg/hr) patch 25 mcg, 25 mcg, Transdermal, Q72H, Arta Silence, MD, 25 mcg at 08/15/17 0905 .  HYDROmorphone (DILAUDID) injection 1 mg, 1 mg, Intravenous, Q3H PRN, 1 mg at 08/16/17 1307 **OR** HYDROmorphone (DILAUDID) injection 2 mg, 2 mg, Intravenous, Q3H PRN, Arta Silence, MD .  multivitamin with minerals tablet 1 tablet, 1 tablet, Oral, Daily, Arta Silence, MD, 1 tablet at 08/16/17 0944 .  ondansetron (ZOFRAN) injection 4 mg, 4 mg, Intravenous, Q6H PRN, Arta Silence, MD, 4 mg at 08/16/17 0344 .  oxyCODONE (OXYCONTIN) 12 hr tablet 10 mg, 10 mg, Oral, Q12H, Kalisetti, Radhika, MD, 10 mg at 08/16/17 0943 .  pantoprazole (PROTONIX) EC tablet 40 mg, 40 mg, Oral, BID AC, Arta Silence, MD, 40 mg at 08/16/17 0944 .  promethazine (PHENERGAN) injection 12.5 mg, 12.5 mg, Intravenous, Q6H PRN, Jodell Cipro, Prasanna, MD, 12.5 mg at 08/15/17 0358 .  senna-docusate (Senokot-S) tablet 1 tablet, 1 tablet, Oral, QHS PRN, Arta Silence, MD .  senna-docusate (Senokot-S) tablet 1 tablet, 1 tablet, Oral, QHS, Gladstone Lighter, MD, 1 tablet at 08/15/17 2217 .  vitamin B-12 (CYANOCOBALAMIN) tablet 500 mcg, 500 mcg, Oral, Daily, Arta Silence, MD, 500 mcg at 08/16/17 0943   Physical exam:  Vitals:   08/15/17 1934 08/15/17 1936 08/15/17 2317 08/16/17 0337  BP:  140/86  (!) 162/82  Pulse: 88 85 90 98  Resp:   18 18  Temp:  99.3 F (37.4 C)  99.8 F (37.7 C)  TempSrc:  Oral  Oral  SpO2: 93% 93% 94% 90%  Weight:      Height:       Physical Exam  Constitutional: She is oriented to person, place, and time and well-developed, well-nourished, and in no distress.  HENT:  Head: Normocephalic and atraumatic.  Mouth/Throat: No oropharyngeal exudate.  Eyes: Pupils are equal, round, and reactive to light. EOM are normal. No scleral icterus.  Neck: Normal range of motion. Neck supple.  Cardiovascular:  Normal rate, regular rhythm and normal heart sounds.  No murmur heard. Pulmonary/Chest: Effort normal and breath sounds normal. No respiratory distress. She has no wheezes.  Abdominal: Soft. She exhibits distension. There is no tenderness. There is no rebound.  Musculoskeletal: Normal range of motion. She exhibits no edema.  Lymphadenopathy:    She has no  cervical adenopathy.  Neurological: She is alert and oriented to person, place, and time. No cranial nerve deficit. Coordination normal.  Skin: Skin is warm and dry. No rash noted. She is not diaphoretic. No erythema.  Psychiatric: Affect normal.       CMP Latest Ref Rng & Units 08/16/2017  Glucose 65 - 99 mg/dL 136(H)  BUN 6 - 20 mg/dL 9  Creatinine 0.44 - 1.00 mg/dL 0.58  Sodium 135 - 145 mmol/L 134(L)  Potassium 3.5 - 5.1 mmol/L 4.0  Chloride 101 - 111 mmol/L 103  CO2 22 - 32 mmol/L 24  Calcium 8.9 - 10.3 mg/dL 8.8(L)  Total Protein 6.5 - 8.1 g/dL 6.2(L)  Total Bilirubin 0.3 - 1.2 mg/dL 0.6  Alkaline Phos 38 - 126 U/L 52  AST 15 - 41 U/L 15  ALT 14 - 54 U/L 16   CBC Latest Ref Rng & Units 08/15/2017  WBC 3.6 - 11.0 K/uL 12.3(H)  Hemoglobin 12.0 - 16.0 g/dL 12.3  Hematocrit 35.0 - 47.0 % 36.7  Platelets 150 - 440 K/uL 451(H)   Dg Chest 2 View  Result Date: 07/30/2017 CLINICAL DATA:  Shortness of breath and near syncope. History of breast cancer. EXAM: CHEST - 2 VIEW COMPARISON:  CT chest dated Jul 17, 2017. Chest x-ray dated December 31, 2012. FINDINGS: The heart size and mediastinal contours are within normal limits. Mild interstitial prominence bilaterally. Small left pleural effusion with left lower lobe atelectasis. No pneumothorax. No acute osseous abnormality. IMPRESSION: 1. Unchanged small left pleural effusion with adjacent left lower lobe atelectasis. Electronically Signed   By: Titus Dubin M.D.   On: 07/30/2017 16:06   Dg Abdomen 1 View  Result Date: 07/30/2017 CLINICAL DATA:  77 year old female with near syncopal  episode abdominal pain post chest CT today. EXAM: ABDOMEN - 1 VIEW COMPARISON:  07/30/2017 chest CT.  07/17/2017 CT abdomen and pelvis. FINDINGS: Residual contrast in renal collecting system/bladder. Gas-filled top-normal size colon splenic flexure level. Nonspecific gas collection right upper quadrant. Findings similar to scout view of recent chest CT. CT detected ascites and peritoneal carcinomatosis not adequately assessed by plain film exam. IMPRESSION: Gas-filled top-normal size colon splenic flexure level. Nonspecific gas collection right upper quadrant. Findings similar to scout view of recent chest CT. CT detected ascites and peritoneal carcinomatosis not adequately assessed by plain film exam. Electronically Signed   By: Genia Del M.D.   On: 07/30/2017 20:19   Ct Chest W Contrast  Result Date: 08/15/2017 CLINICAL DATA:  77 y/o F; history of breast cancer with metastasis to the stomach presenting with right-sided abdominal pain. EXAM: CT CHEST WITH CONTRAST TECHNIQUE: Multidetector CT imaging of the chest was performed during intravenous contrast administration. CONTRAST:  168mL ISOVUE-370 IOPAMIDOL (ISOVUE-370) INJECTION 76% COMPARISON:  07/30/2017 CT of the chest. 08/11/2017 CT abdomen and pelvis. FINDINGS: Cardiovascular: No significant vascular findings. Normal heart size. No pericardial effusion. Mediastinum/Nodes: No lymphadenopathy by imaging criteria. Thyroid gland, trachea, and esophagus demonstrate no significant findings. Lungs/Pleura: There are several stable subcentimeter pulmonary nodules at the periphery of the right upper lobe and middle lobe. Interval development of consolidations within the lower lobes bilaterally, hazy ground-glass opacities of the upper lobes, and interlobular septal thickening. Increased moderate bilateral pleural effusions. Upper Abdomen: Small volume of ascites. Prominent gastrohepatic lymph nodes better characterized on prior CT of abdomen and pelvis.  Cholecystectomy. Musculoskeletal: No chest wall abnormality. No acute or significant osseous findings. IMPRESSION: 1. Increased moderate bilateral pleural effusions, hazy ground-glass opacities of the  lungs, and smooth interlobular septal thickening. Findings probably represent pulmonary edema. 2. Dependent lower lobe consolidations probably represent atelectasis. Underlying pneumonia is possible. 3. Several stable subcentimeter pulmonary nodules at the periphery of the right upper lobe and middle lobe. Lower lobe nodules are obscured by consolidation. Consider reassessment for pulmonary metastatic disease after resolution of acute airspace disease and effusions. Electronically Signed   By: Kristine Garbe M.D.   On: 08/15/2017 03:07   Ct Angio Chest Pe W And/or Wo Contrast  Result Date: 07/30/2017 CLINICAL DATA:  PE suspected, intermediate prob, positive D-dimer. Acute onset of shortness of breath earlier today when here for a bone scan. Abdominal pain. EXAM: CT ANGIOGRAPHY CHEST WITH CONTRAST TECHNIQUE: Multidetector CT imaging of the chest was performed using the standard protocol during bolus administration of intravenous contrast. Multiplanar CT image reconstructions and MIPs were obtained to evaluate the vascular anatomy. CONTRAST:  67mL ISOVUE-370 IOPAMIDOL (ISOVUE-370) INJECTION 76% COMPARISON:  Chest and abdominal CT 07/17/2017 FINDINGS: Cardiovascular: There are no filling defects within the pulmonary arteries to suggest pulmonary embolus. Tortuosity of the thoracic aorta without dissection. Common origin of the left common carotid and brachiocephalic artery, normal variant arch configuration. Upper normal heart size. No pericardial effusion. Mediastinum/Nodes: Increase size of right hilar lymph nodes from prior exam currently 10 and 11 mm short axis, previously 7 mm. Rounded fluid density structure adjacent to the lower esophagus likely represents loculated ascites rather than paraesophageal  node. Possible 5 mm right internal mammary node versus adjacent chondral cartilage. Small anterior right epicardial nodes are unchanged. No axillary adenopathy. Post bilateral mastectomy. Lungs/Pleura: Evaluation of pulmonary nodules seen on prior exam is suboptimal due to breathing motion artifact. Majority these nodules are small measuring approximately 4 mm, and unchanged. However a right lower lobe nodule image 49 series 6 has increased in size currently 7 mm, previously 4 mm. The diaphragmatic nodule in the left lower lobe on prior exam is currently obscured. Left pleural effusion is increased, now small to moderate. There is adjacent compressive atelectasis. Tiny right pleural effusion. Minimal patchy ground-glass opacities in the right lower and left upper lobes are likely atelectasis. Upper Abdomen: Increased upper abdominal ascites since prior abdominal CT, moderate in degree. Musculoskeletal: There are no acute or suspicious osseous abnormalities. Degenerative change in the spine. Vertebral body hemangioma in T3, not hypermetabolic and bone scan earlier today. Review of the MIP images confirms the above findings. IMPRESSION: 1. No pulmonary embolus. 2. Increased left pleural effusion development of small right pleural effusion since exam 2 weeks ago. Increased upper abdominal ascites. 3. Small pulmonary nodules, majority of which are unchanged or not as well evaluated, however definite increase size of a right lower lobe pulmonary nodule over the past 2 weeks suggesting progression of metastatic disease. 4. New borderline right hilar lymph nodes measuring up to 11 mm short axis, nonspecific in the setting for reactive versus metastatic. Electronically Signed   By: Jeb Levering M.D.   On: 07/30/2017 19:08   Nm Bone Scan Whole Body  Result Date: 07/30/2017 CLINICAL DATA:  Breast cancer.  Restaging.  Vomiting from pain. EXAM: NUCLEAR MEDICINE WHOLE BODY BONE SCAN TECHNIQUE: Whole body anterior and  posterior images were obtained approximately 3 hours after intravenous injection of radiopharmaceutical. RADIOPHARMACEUTICALS:  21.4 mCi Technetium-39m MDP IV COMPARISON:  Bone scan 10/30/2016, PET-CT scan 08/30/2016 FINDINGS: No focal uptake in the axillary or appendicular skeleton to localize skeletal metastasis. Diffuse uptake in the anterior calvarium is similar comparison exam in favored physiologic  or metabolic. IMPRESSION: No scintigraphic evidence skeletal metastasis. Electronically Signed   By: Suzy Bouchard M.D.   On: 07/30/2017 16:58   Ct Abdomen Pelvis W Contrast  Result Date: 08/11/2017 CLINICAL DATA:  Increase weakness and poor appetite. Vomiting for 3 days. Metastatic breast cancer. EXAM: CT ABDOMEN AND PELVIS WITH CONTRAST TECHNIQUE: Multidetector CT imaging of the abdomen and pelvis was performed using the standard protocol following bolus administration of intravenous contrast. CONTRAST:  139mL ISOVUE-300 IOPAMIDOL (ISOVUE-300) INJECTION 61% COMPARISON:  08/07/2017 FINDINGS: Lower chest: There are moderate bilateral pleural effusions, left greater than right. Increased from previous exam. Passive atelectasis within the lower lobes noted. Small pulmonary nodules are identified bilaterally compatible with metastatic disease. Hepatobiliary: There is been previous cholecystectomy. No suspicious liver abnormality identified. Pancreas: Pancreas normal. Spleen: The spleen appears normal. Adrenals/Urinary Tract: Normal adrenal glands. No kidney mass or hydronephrosis identified bilaterally. Urinary bladder is unremarkable. Stomach/Bowel: The stomach appears nondistended. The small bowel loops have a normal caliber without obstruction. No pathologic dilatation of the colon. Distal colonic diverticulosis is identified without acute diverticulitis. Vascular/Lymphatic: Normal appearance of the abdominal aorta. Progressive adenopathy within the gastrohepatic and porta hepatis region. Index lymph node  measures 1 cm, image 26/2. Previously 0.7 cm. No retroperitoneal adenopathy. No pelvic or inguinal adenopathy. Reproductive: Status post hysterectomy.  No adnexal mass. Other: There is a moderate volume of ascites within the abdomen and pelvis which has increased from 07/17/2017. Peritoneal carcinomatosis with omental caking is identified. Peritoneal lesion in the left upper quadrant of the abdomen measures 3.6 cm, image 34/2. Previously 3.5 cm the omental cake has a maximum thickness of 2.7 cm, image 58/2. Previously 2.3 cm. Musculoskeletal: Degenerative disc disease within the lower thoracic and lumbar spine. No suspicious bone lesions identified. IMPRESSION: 1. No acute findings identified within the abdomen or pelvis. 2. Peritoneal carcinomatosis with increase in volume of ascites with mild increase in size of peritoneal lesions and omental caking. 3. Bilateral pleural effusions are increased in volume from previous exam. Small pulmonary nodules within the lungs compatible with metastatic disease. Electronically Signed   By: Kerby Moors M.D.   On: 08/11/2017 18:08   US Abdomen Limited  Result Date: 08/15/2017 CLINICAL DATA:  Metastatic breast carcinoma. Abdominal carcinomatosis. Bilateral pleural effusions and ascites noted on recent CT chest. Paracentesis requested. EXAM: LIMITED ABDOMEN ULTRASOUND FOR ASCITES TECHNIQUE: Limited ultrasound survey for ascites was performed in all four abdominal quadrants. COMPARISON:  CT 08/15/2017 FINDINGS: There is a small amount of scattered ascites, predominantly in the low pelvis. No large pocket to allow safe paracentesis. IMPRESSION: 1. Small amount of scattered pelvic ascites. Findings discussed with patient. Paracentesis deferred. Electronically Signed   By: Lucrezia Europe M.D.   On: 08/15/2017 10:46   Ct Biopsy  Result Date: 08/07/2017 INDICATION: Breast cancer, peritoneal carcinomatosis, ascites, concern for metastatic disease EXAM: CT-GUIDED BIOPSY ANTERIOR  OMENTAL CARCINOMATOSIS MEDICATIONS: 1% lidocaine local ANESTHESIA/SEDATION: 1.5 mg IV Versed; 50 mcg IV Fentanyl Moderate Sedation Time:  13 minutes The patient was continuously monitored during the procedure by the interventional radiology nurse under my direct supervision. PROCEDURE: The procedure, risks, benefits, and alternatives were explained to the patient. Questions regarding the procedure were encouraged and answered. The patient understands and consents to the procedure. Previous imaging reviewed. Patient positioned supine. Noncontrast localization CT performed. The lower anterior omental carcinomatosis was localized. Overlying skin marked for a anterior right lateral approach. Under sterile conditions and local anesthesia, CT guidance was utilized to advance a 17 gauge  11.8 cm lesion to the omental carcinomatosis. Needle position confirmed with CT. 18 gauge core biopsies obtained and placed in formalin. Needle removed. Postprocedure imaging demonstrates no hemorrhage or hematoma. Patient tolerated the procedure well without complication. Vital sign monitoring by nursing staff during the procedure will continue as patient is in the special procedures unit for post procedure observation. FINDINGS: The images document guide needle placement within the anterior omental mass. Post biopsy images demonstrate no hemorrhage or hematoma. COMPLICATIONS: None immediate. IMPRESSION: Successful CT-guided core biopsy of the anterior omental mass. Electronically Signed   By: Jerilynn Mages.  Shick M.D.   On: 08/07/2017 11:40   Dg Chest Port 1 View  Result Date: 08/15/2017 CLINICAL DATA:  S/p Thoracentesis- left side EXAM: PORTABLE CHEST - 1 VIEW COMPARISON:  the previous day's study FINDINGS: No pneumothorax. Small bilateral pleural effusions, decreased on the left since prior study. Improved aeration at the left lung base. Heart size and mediastinal contours are within normal limits. Low lung volumes with prominent perihilar  bronchovascular markings. Some linear scarring or subsegmental atelectasis in both lung bases. Visualized bones unremarkable.  Cholecystectomy clips. IMPRESSION: 1. No pneumothorax post left thoracentesis, with improvement in left lower lung consolidation/atelectasis. 2. Small residual bilateral pleural effusions. Electronically Signed   By: Lucrezia Europe M.D.   On: 08/15/2017 10:48   Dg Chest Portable 1 View  Result Date: 08/15/2017 CLINICAL DATA:  Breast cancer with gastric metastases. Right-sided abdominal pain. Shortness of breath. EXAM: PORTABLE CHEST 1 VIEW COMPARISON:  07/30/2017 FINDINGS: Shallow inspiration. Cardiac enlargement. Pulmonary vascularity is normal. There is increasing left and developing right pleural effusion with basilar atelectasis or consolidation. Interstitial pattern to the lungs could reflect interstitial edema or interstitial metastasis. No pneumothorax. Mediastinal contours appear intact. IMPRESSION: Cardiac enlargement. Increasing left and developing right pleural effusion with basilar atelectasis or consolidation. Interstitial pattern to the lungs may indicate interstitial edema or metastasis. Electronically Signed   By: Lucienne Capers M.D.   On: 08/15/2017 01:26   [place] Ascites (abdomen Limited)  Result Date: 07/25/2017 CLINICAL DATA:  History of breast cancer, now with peritoneal carcinomatosis. Please perform evaluate abdomen for malignant ascites and perform ultrasound-guided paracentesis as indicated. EXAM: LIMITED ABDOMEN ULTRASOUND FOR ASCITES TECHNIQUE: Limited ultrasound survey for ascites was performed in all four abdominal quadrants. COMPARISON:  CT of the chest, abdomen and pelvis-07/17/2017 FINDINGS: Sonographic evaluation performed by the dictating interventional radiologist demonstrates only a trace amount of intra-abdominal ascites located within left upper abdominal quadrant (image 5). IMPRESSION: Trace amount of intra-abdominal ascites located within the left  upper abdominal quadrant, too small to allow for safe ultrasound-guided paracentesis. Electronically Signed   By: Sandi Mariscal M.D.   On: 07/25/2017 14:06   US Thoracentesis Asp Pleural Space W/img Guide  Result Date: 08/15/2017 INDICATION: Bilateral pleural effusions.  Metastatic breast carcinoma. EXAM: ULTRASOUND GUIDED LEFT THORACENTESIS MEDICATIONS: LIDOCAINE 1% SUBCUTANEOUS COMPLICATIONS: None immediate. PROCEDURE: An ultrasound guided thoracentesis was thoroughly discussed with the patient and questions answered. The benefits, risks, alternatives and complications were also discussed. The patient understands and wishes to proceed with the procedure. Written consent was obtained. Survey ultrasound demonstrated bilateral pleural effusions, left greater than right. Ultrasound was performed to localize and mark an adequate pocket of fluid in the left chest. The area was then prepped and draped in the normal sterile fashion. 1% Lidocaine was used for local anesthesia. Under ultrasound guidance a Safe-T-Centesis catheter was introduced. Thoracentesis was performed. The catheter was removed and a dressing applied. FINDINGS: A total  of approximately 500 mL of clear amber fluid was removed. Samples were sent to the laboratory as requested by the clinical team. IMPRESSION: Successful ultrasound guided left thoracentesis yielding 500 mL of pleural fluid. Follow-up chest radiograph shows no pneumothorax. Electronically Signed   By: Lucrezia Europe M.D.   On: 08/15/2017 10:45    Assessment and plan-  Patient is a 77 y.o. female with history of stage IIIb breast cancer, status post mastectomy and axillary lymph node dissection, recently discovered peritoneal carcinomatosis presented to emergency room complaining shortness of breath, intractable nausea vomiting, general weakness and abdominal pain.  #Peritoneal carcinomatosis, status post omental biopsy confirming metastatic adenocarcinoma. However this is a different  histology than her breast cancer histology. Most likely GI or pancreatic origin.  Her pathology less likely ovary. Tumor markers including CEA, CA-19-9, Ca1 25 nonconclusive.  All mildly elevated.  Case was discussed on tumor board.  Tissue of origin testing pending.  I recommend obtaining an MRI abdomen and pelvis with and without contrast to look for occult primary. If no primary is discovered, will treat with palliative chemotherapy for peritoneal carcinomatosis of unknown origin. Plan use carboplatin and gemcitabine empirically.  Other option carbo and Taxol Patient previously adamantly refused any chemotherapy.  Most recently agreed to chemotherapy treatment.  She was supposed to have Mediport placed by Dr. Marlou Starks next Monday.  Given the hospitalization, Mediport appointment most likely will be delayed. Further plan depending on MRI findings.   #Pleural effusion, status post left thoracentesis #Neoplasm related pain, currently on fentanyl 25 MCG, OxyContin 10 mg twice daily, Dilaudid as needed. On two long acting narcotics.  Fentanyl dose increased to 37.5mg  today. Will continue her on oxycontin 10mg  tonight, dc morning oxycontin dose.  Add oxycondone 10mg  Q4h PRN. Can further adjust fentanyl dose.   Thank you for allowing me to participate in the care of this patient.  Total face to face encounter time for this patient visit was 25 min. >50% of the time was  spent in counseling and coordination of care.   Earlie Server, MD, PhD Hematology Oncology [employer] at [facility] Pager- [phone] 08/16/2017

## 2017-08-17 ENCOUNTER — Other Ambulatory Visit: Payer: Self-pay | Admitting: Oncology

## 2017-08-17 DIAGNOSIS — C801 Malignant (primary) neoplasm, unspecified: Secondary | ICD-10-CM

## 2017-08-17 DIAGNOSIS — C50919 Malignant neoplasm of unspecified site of unspecified female breast: Secondary | ICD-10-CM

## 2017-08-17 LAB — COMPREHENSIVE METABOLIC PANEL
ALT: 14 U/L (ref 14–54)
ANION GAP: 7 (ref 5–15)
AST: 18 U/L (ref 15–41)
Albumin: 2.7 g/dL — ABNORMAL LOW (ref 3.5–5.0)
Alkaline Phosphatase: 51 U/L (ref 38–126)
BUN: 9 mg/dL (ref 6–20)
CHLORIDE: 100 mmol/L — AB (ref 101–111)
CO2: 26 mmol/L (ref 22–32)
Calcium: 9.1 mg/dL (ref 8.9–10.3)
Creatinine, Ser: 0.55 mg/dL (ref 0.44–1.00)
GFR calc non Af Amer: >60 mL/min (ref >60)
Glucose, Bld: 127 mg/dL — ABNORMAL HIGH (ref 65–99)
Potassium: 4.1 mmol/L (ref 3.5–5.1)
SODIUM: 133 mmol/L — AB (ref 135–145)
Total Bilirubin: 0.6 mg/dL (ref 0.3–1.2)
Total Protein: 6.3 g/dL — ABNORMAL LOW (ref 6.5–8.1)

## 2017-08-17 LAB — CBC
HCT: 34.2 % — ABNORMAL LOW (ref 35.0–47.0)
Hemoglobin: 11.4 g/dL — ABNORMAL LOW (ref 12.0–16.0)
MCH: 29.9 pg (ref 26.0–34.0)
MCHC: 33.4 g/dL (ref 32.0–36.0)
MCV: 89.5 fL (ref 80.0–100.0)
PLATELETS: 343 10*3/uL (ref 150–440)
RBC: 3.83 MIL/uL (ref 3.80–5.20)
RDW: 13.2 % (ref 11.5–14.5)
WBC: 9.6 10*3/uL (ref 3.6–11.0)

## 2017-08-17 MED ORDER — FLEET ENEMA 7-19 GM/118ML RE ENEM
1.0000 | ENEMA | Freq: Once | RECTAL | Status: AC
  Administered 2017-08-17: 17:00:00 1 via RECTAL

## 2017-08-17 MED ORDER — LACTULOSE 10 GM/15ML PO SOLN
20.0000 g | Freq: Two times a day (BID) | ORAL | Status: DC | PRN
  Administered 2017-08-17: 12:00:00 20 g via ORAL
  Filled 2017-08-17: qty 30

## 2017-08-17 MED ORDER — BISACODYL 5 MG PO TBEC: 10.0000 mg | DELAYED_RELEASE_TABLET | Freq: Every day | ORAL | Status: DC | PRN

## 2017-08-17 MED ORDER — DEXAMETHASONE 4 MG PO TABS: 8.0000 mg | ORAL_TABLET | Freq: Every day | ORAL | 1 refills | Status: DC

## 2017-08-17 MED ORDER — HYDROMORPHONE HCL 1 MG/ML IJ SOLN: 1.0000 mg | INTRAMUSCULAR | Status: DC | PRN

## 2017-08-17 MED ORDER — ONDANSETRON HCL 8 MG PO TABS: 8.0000 mg | ORAL_TABLET | Freq: Two times a day (BID) | ORAL | 1 refills | Status: DC | PRN

## 2017-08-17 MED ORDER — LIDOCAINE-PRILOCAINE 2.5-2.5 % EX CREA: TOPICAL_CREAM | CUTANEOUS | 3 refills | Status: DC

## 2017-08-17 MED ORDER — WITCH HAZEL-GLYCERIN EX PADS
MEDICATED_PAD | CUTANEOUS | Status: DC | PRN
  Filled 2017-08-17: qty 100

## 2017-08-17 NOTE — Progress Notes (Addendum)
ALERT: A disease instance has been permanently removed from this patient's pathway record and replaced with a new disease instance. Information on the new disease instance will be transmitted in a separate message.  Disease Being Removed: Breast  Reason for Removal: Previous diagnosis has morphed into a different diagnosis 

## 2017-08-17 NOTE — Progress Notes (Signed)
Oxycontin was not given overnight due to level of consciousness. Pnt had multiple changes in pain medication during day shift 6/21 resulting in increased drowsiness overnight. Pnt alert to voice but only able to keep alert for 10-15 seconds before pnt fell back to sleep. Asked pnt to take a sip of water before attempting pills and pnt began to cough. It was not safe to continue to keep pnt alert enough to take her medication.  Upon entering pnts room this am after the nurse tech got pnt up to bedside commode, pnt, still very drowsy, said she still has "no pain" and doesn't want anything else until she "can wakes up". Told her I would let MD know.

## 2017-08-17 NOTE — Progress Notes (Signed)
[place] [facility] [place] NAME: Norma Johns    MR#:  [id number]  DATE OF BIRTH:  11/27/1940  SUBJECTIVE:  CHIEF COMPLAINT:   Chief Complaint  Patient presents with  . Abdominal Pain   -Drowsy last night and slept better.  Alert and comfortable in bed.  Still complains of pain on deep inspiration. -Constipated  REVIEW OF SYSTEMS:  Review of Systems  Constitutional: Positive for malaise/fatigue. Negative for chills and fever.       Loss of appetite  HENT: Negative for congestion, ear discharge, hearing loss and nosebleeds.   Eyes: Negative for blurred vision and double vision.  Respiratory: Negative for cough, shortness of breath and wheezing.   Cardiovascular: Negative for chest pain and palpitations.  Gastrointestinal: Positive for abdominal pain, constipation and nausea. Negative for diarrhea and vomiting.  Genitourinary: Negative for dysuria.  Musculoskeletal: Negative for myalgias.  Neurological: Negative for dizziness, focal weakness, seizures, weakness and headaches.  Psychiatric/Behavioral: Negative for depression.    DRUG ALLERGIES:   Allergies  Allergen Reactions  . Chocolate Shortness Of Breath  . Other Shortness Of Breath and Swelling    PT STATES ALLERGY TO "SOME TYPE OF DYE THAT WAS USED AT [facility]."  SWELLING TO ARM AND SOB.  PT CANNOT REMEMBER WHAT THE DYE WAS USED FOR.  . Sulfa Antibiotics Other (See Comments)    unknown  . Atorvastatin Other (See Comments)    Leg pain   . Minocycline Hcl Itching  . Naproxen Other (See Comments)    Indigestion  . Niacin Other (See Comments)    Felt like she was on fire  . Septra [Sulfamethoxazole-Trimethoprim] Other (See Comments)    unknown  . Vioxx [Rofecoxib] Other (See Comments)    unknown  . Penicillins Itching and Rash    Has patient had a PCN reaction causing immediate rash, facial/tongue/throat swelling, SOB or lightheadedness with hypotension: Unknown Has  patient had a PCN reaction causing severe rash involving mucus membranes or skin necrosis: Unknown Has patient had a PCN reaction that required hospitalization: No Has patient had a PCN reaction occurring within the last 10 years: No If all of the above answers are "NO", then may proceed with Cephalosporin use.     VITALS:  Blood pressure 126/78, pulse 84, temperature 98.4 F (36.9 C), temperature source Oral, resp. rate 17, height 5\' 3"  (1.6 m), weight 79.5 kg (175 lb 4.3 oz), SpO2 92 %.  PHYSICAL EXAMINATION:  Physical Exam  GENERAL:  77 y.o.-year-old patient lying in the bed, in distress due to pain. EYES: Pupils equal, round, reactive to light and accommodation. No scleral icterus. Extraocular muscles intact.  HEENT: Head atraumatic, normocephalic. Oropharynx and nasopharynx clear.  NECK:  Supple, no jugular venous distention. No thyroid enlargement, no tenderness.  LUNGS: Normal breath sounds bilaterally, no wheezing, rales,rhonchi or crepitation. No use of accessory muscles of respiration. Significantly decreased bibasilar breath sounds CARDIOVASCULAR: S1, S2 normal. No rubs, or gallops. 2/6 systolic murmur present ABDOMEN: distended, firm, tender abdomen. Bowel sounds present. No organomegaly or mass.  EXTREMITIES: No pedal edema, cyanosis, or clubbing.  NEUROLOGIC: Cranial nerves II through XII are intact. Muscle strength 5/5 in all extremities. Sensation intact. Gait not checked.  PSYCHIATRIC: The patient is alert and oriented x 3.  SKIN: No obvious rash, lesion, or ulcer.    LABORATORY PANEL:   CBC Recent Labs  Lab 08/17/17 0453  WBC 9.6  HGB 11.4*  HCT 34.2*  PLT  343   ------------------------------------------------------------------------------------------------------------------  Chemistries  Recent Labs  Lab 08/17/17 0453  NA 133*  K 4.1  CL 100*  CO2 26  GLUCOSE 127*  BUN 9  CREATININE 0.55  CALCIUM 9.1  AST 18  ALT 14  ALKPHOS 51  BILITOT 0.6    ------------------------------------------------------------------------------------------------------------------  Cardiac Enzymes No results for input(s): TROPONINI in the last 168 hours. ------------------------------------------------------------------------------------------------------------------  RADIOLOGY:  Mr Pelvis W Wo Contrast  Result Date: 08/16/2017 CLINICAL DATA:  History of breast carcinoma. No adjuvant chemotherapy. Patient presents with peritoneal carcinomatosis within the abdomen on CT 08/11/2017. Evaluate for source of carcinomatosis EXAM: MRI ABDOMEN AND PELVIS WITHOUT AND WITH CONTRAST TECHNIQUE: Multiplanar multisequence MR imaging of the abdomen and pelvis was performed both before and after the administration of intravenous contrast. CONTRAST:  61mL MULTIHANCE GADOBENATE DIMEGLUMINE 529 MG/ML IV SOLN COMPARISON:  CT 08/11/2017 FINDINGS: COMBINED FINDINGS FOR BOTH MR ABDOMEN AND PELVIS Lower chest: Bilateral large pleural effusion and passive atelectasis. Hepatobiliary: No focal hepatic lesion. Postcontrast imaging is degraded by respiratory motion. Moderate volume of free fluid along the margin of the liver. Pancreas: The pancreas is grossly normal. Motion degradation. No mass lesion identified Spleen:  Normal spleen Adrenals/Urinary Tract: Adrenal glands and kidneys are normal. No obstruction. Normal bladder Stomach/Bowel: Stomach common duodenum normal. Bowel is unremarkable. MRI is not well suited to evaluate the bowel. Vascular/Lymphatic: Abdominal aorta normal caliber. No retroperitoneal periportal lymphadenopathy. Reproductive: Post hysterectomy anatomy Other: Peritoneal carcinomatosis again demonstrated. There is nodular thickening in the greater omentum. The nodularity in the pelvic sidewalls is better depicted on CT imaging. The some nodular noted on image 66/8. Musculoskeletal: No aggressive osseous lesion. IMPRESSION: 1. Peritoneal carcinomatosis better imaged on  comparison CT. 2. No primary source of carcinomatosis identified. Exam is degraded by respiratory motion. 3. Moderate intraperitoneal free fluid and large bilateral pleural effusions. Electronically Signed   By: Suzy Bouchard M.D.   On: 08/16/2017 16:08   Mr Abdomen W Wo Contrast  Result Date: 08/16/2017 CLINICAL DATA:  History of breast carcinoma. No adjuvant chemotherapy. Patient presents with peritoneal carcinomatosis within the abdomen on CT 08/11/2017. Evaluate for source of carcinomatosis EXAM: MRI ABDOMEN AND PELVIS WITHOUT AND WITH CONTRAST TECHNIQUE: Multiplanar multisequence MR imaging of the abdomen and pelvis was performed both before and after the administration of intravenous contrast. CONTRAST:  47mL MULTIHANCE GADOBENATE DIMEGLUMINE 529 MG/ML IV SOLN COMPARISON:  CT 08/11/2017 FINDINGS: COMBINED FINDINGS FOR BOTH MR ABDOMEN AND PELVIS Lower chest: Bilateral large pleural effusion and passive atelectasis. Hepatobiliary: No focal hepatic lesion. Postcontrast imaging is degraded by respiratory motion. Moderate volume of free fluid along the margin of the liver. Pancreas: The pancreas is grossly normal. Motion degradation. No mass lesion identified Spleen:  Normal spleen Adrenals/Urinary Tract: Adrenal glands and kidneys are normal. No obstruction. Normal bladder Stomach/Bowel: Stomach common duodenum normal. Bowel is unremarkable. MRI is not well suited to evaluate the bowel. Vascular/Lymphatic: Abdominal aorta normal caliber. No retroperitoneal periportal lymphadenopathy. Reproductive: Post hysterectomy anatomy Other: Peritoneal carcinomatosis again demonstrated. There is nodular thickening in the greater omentum. The nodularity in the pelvic sidewalls is better depicted on CT imaging. The some nodular noted on image 66/8. Musculoskeletal: No aggressive osseous lesion. IMPRESSION: 1. Peritoneal carcinomatosis better imaged on comparison CT. 2. No primary source of carcinomatosis identified. Exam  is degraded by respiratory motion. 3. Moderate intraperitoneal free fluid and large bilateral pleural effusions. Electronically Signed   By: Suzy Bouchard M.D.   On: 08/16/2017 16:08   US Abdomen  Limited  Result Date: 08/15/2017 CLINICAL DATA:  Metastatic breast carcinoma. Abdominal carcinomatosis. Bilateral pleural effusions and ascites noted on recent CT chest. Paracentesis requested. EXAM: LIMITED ABDOMEN ULTRASOUND FOR ASCITES TECHNIQUE: Limited ultrasound survey for ascites was performed in all four abdominal quadrants. COMPARISON:  CT 08/15/2017 FINDINGS: There is a small amount of scattered ascites, predominantly in the low pelvis. No large pocket to allow safe paracentesis. IMPRESSION: 1. Small amount of scattered pelvic ascites. Findings discussed with patient. Paracentesis deferred. Electronically Signed   By: Lucrezia Europe M.D.   On: 08/15/2017 10:46   Dg Chest Port 1 View  Result Date: 08/15/2017 CLINICAL DATA:  S/p Thoracentesis- left side EXAM: PORTABLE CHEST - 1 VIEW COMPARISON:  the previous day's study FINDINGS: No pneumothorax. Small bilateral pleural effusions, decreased on the left since prior study. Improved aeration at the left lung base. Heart size and mediastinal contours are within normal limits. Low lung volumes with prominent perihilar bronchovascular markings. Some linear scarring or subsegmental atelectasis in both lung bases. Visualized bones unremarkable.  Cholecystectomy clips. IMPRESSION: 1. No pneumothorax post left thoracentesis, with improvement in left lower lung consolidation/atelectasis. 2. Small residual bilateral pleural effusions. Electronically Signed   By: Lucrezia Europe M.D.   On: 08/15/2017 10:48   US Thoracentesis Asp Pleural Space W/img Guide  Result Date: 08/15/2017 INDICATION: Bilateral pleural effusions.  Metastatic breast carcinoma. EXAM: ULTRASOUND GUIDED LEFT THORACENTESIS MEDICATIONS: LIDOCAINE 1% SUBCUTANEOUS COMPLICATIONS: None immediate. PROCEDURE: An  ultrasound guided thoracentesis was thoroughly discussed with the patient and questions answered. The benefits, risks, alternatives and complications were also discussed. The patient understands and wishes to proceed with the procedure. Written consent was obtained. Survey ultrasound demonstrated bilateral pleural effusions, left greater than right. Ultrasound was performed to localize and mark an adequate pocket of fluid in the left chest. The area was then prepped and draped in the normal sterile fashion. 1% Lidocaine was used for local anesthesia. Under ultrasound guidance a Safe-T-Centesis catheter was introduced. Thoracentesis was performed. The catheter was removed and a dressing applied. FINDINGS: A total of approximately 500 mL of clear amber fluid was removed. Samples were sent to the laboratory as requested by the clinical team. IMPRESSION: Successful ultrasound guided left thoracentesis yielding 500 mL of pleural fluid. Follow-up chest radiograph shows no pneumothorax. Electronically Signed   By: Lucrezia Europe M.D.   On: 08/15/2017 10:45    EKG:   Orders placed or performed in visit on 08/15/17  . EKG 12-Lead    ASSESSMENT AND PLAN:   77 year old female with past medical history significant for metastatic breast cancer diagnosed in June 2018, status post bilateral mastectomy #2 start chemotherapy with omental carcinomatosis, GERD, depression presents to hospital secondary to worsening abdominal pain and nausea and poor appetite.  1. Abdominal pain- likely secondary to omental carcinomatosis. -Recent CT-guided omental biopsy confirming metastatic adenocarcinoma.  Unknown primary -History of breast cancer diagnosed last year. -Oncology consulted. -Paracentesis attempted but unable to drain more fluid. -Pain control advised. -Started on fentanyl patch as outpatient recently.  Prn oxycodone for moderate pain and  Continue Dilaudid IV for severe pain  2.  Metastatic breast cancer-diagnosed in  June 2018, status post bilateral mastectomy.  Patient has refused chemo and radiation at the time. -Now with omental carcinomatosis and elevated tumor markers -Omental biopsy confirming metastatic adenocarcinoma.  Unknown primary -MRI of the abdomen and pelvis with no source identified. -Oncology consulted.  Plan to start chemotherapy next week-oncology input advised -left sided thoracentesis with 500 mL  fluid removed  3.  Hyponatremia-received IV fluids.  Continue to monitor  4.  Poor oral intake-likely secondary to underlying cancer.  Dietitian consulted  5. GERD- protonix  6. DVT Prophylaxis- lovenox  7. Constipation- secondary to pain meds- laxatives added  PT consulted for today  Updated family at bedside Overall poor prognosis   All the records are reviewed and case discussed with Care Management/Social Workerr. Management plans discussed with the patient, family and they are in agreement.  CODE STATUS: Full Code  TOTAL TIME TAKING CARE OF THIS PATIENT: 36 minutes.   POSSIBLE D/C IN 2 DAYS, DEPENDING ON CLINICAL CONDITION.   Gladstone Lighter M.D on 08/17/2017 at 9:27 AM  Between 7am to 6pm - Pager - [phone]  After 6pm go to [email or web address] - password [email or web address] [facility]  Office  [phone]  CC: Primary care physician; Mar Daring, PA-C

## 2017-08-17 NOTE — Plan of Care (Signed)
  Problem: Education: Goal: Knowledge of General Education information will improve Outcome: Progressing   Problem: Health Behavior/Discharge Planning: Goal: Ability to manage health-related needs will improve Outcome: Progressing   Problem: Clinical Measurements: Goal: Ability to maintain clinical measurements within normal limits will improve Outcome: Progressing Goal: Will remain free from infection Outcome: Progressing Goal: Diagnostic test results will improve Outcome: Progressing Goal: Respiratory complications will improve Outcome: Progressing Goal: Cardiovascular complication will be avoided Outcome: Progressing   Problem: Activity: Goal: Risk for activity intolerance will decrease Outcome: Progressing   Problem: Nutrition: Goal: Adequate nutrition will be maintained Outcome: Progressing   Problem: Coping: Goal: Level of anxiety will decrease Outcome: Progressing   Problem: Elimination: Goal: Will not experience complications related to bowel motility Outcome: Progressing Goal: Will not experience complications related to urinary retention Outcome: Progressing   Problem: Pain Managment: Goal: General experience of comfort will improve Outcome: Progressing   Problem: Safety: Goal: Ability to remain free from injury will improve Outcome: Progressing   Problem: Skin Integrity: Goal: Risk for impaired skin integrity will decrease Outcome: Progressing   Problem: Education: Goal: Knowledge of the prescribed therapeutic regimen will improve Outcome: Progressing   Problem: Activity: Goal: Ability to implement measures to reduce episodes of fatigue will improve Outcome: Progressing   Problem: Bowel/Gastric: Goal: Will not experience complications related to bowel motility Outcome: Progressing   Problem: Coping: Goal: Ability to identify and develop effective coping behavior will improve Outcome: Progressing   Problem: Nutritional: Goal: Maintenance of  adequate nutrition will improve Outcome: Progressing

## 2017-08-17 NOTE — Evaluation (Addendum)
Physical Therapy Evaluation Patient Details Name: Norma Johns MRN: [id number] DOB: Aug 07, 1940 Today's Date: 08/17/2017   History of Present Illness  pt is a 77 y.o F admitted on 08/15/2017 with CC of RUQ pain and progression of SOB over the course of 1-2 weeks and dx of acute hypoxemic respiratory failure. hx of breast cancer with bil mastectomy and recent dx of breast cancer metastacic breast cancer.   Clinical Impression  Pt laying in bed with grandson present for evaluation. She is A&O x 4 and reports no pain. At rest prior to activity O2 was 89%. She is able to perform bed mobility MOD I with report of pain during activity using bed rails for rolling. She demonstrates limited strength in bil LE while sitting EOB. Once instructed on breathing technique O2 remained between 90-93% throughout session. She was able to perform sit to stand transition MOD I with verbal cues for proper hand placement with RW and ambulated ~61ft reporting 5/10 pain in the RUQ. She did report feeling dizzy intermittent requiring intermittent breaks. She returned to the recliner and performed light exercise reporting she did feel better and pain in the chest had resolved. She would benefit from continued physical therapy to promote strength, endurance, and bed mobility, maximize her function. Pt reports she has family assisting her once she returns home and would benefit from continued physical therapy via HHPT.    Follow Up Recommendations Home health PT    Equipment Recommendations  Rolling walker with 5" wheels(pt has rollator)    Recommendations for Other Services       Precautions / Restrictions Precautions Precautions: None Restrictions Weight Bearing Restrictions: No      Mobility  Bed Mobility Overal bed mobility: Modified Independent             General bed mobility comments: pt required verbal cues for log rolling technique. was able to perform mod I with use of hands on bed railing to  assist with rolling(reported caused LUQ pain during activity)  Transfers Overall transfer level: Modified independent Equipment used: Rolling walker (2 wheeled)             General transfer comment: verbal cues given to push with one hand from bed and other on walker for safety  Ambulation/Gait Ambulation/Gait assistance: Supervision Gait Distance (Feet): 60 Feet Assistive device: Rolling walker (2 wheeled) Gait Pattern/deviations: Step-through pattern;Antalgic     General Gait Details: pt ambulated slowly reporting minimal dizziniess intermittent that would cease with proper breathing technique(throughout session O2 remained between 90-93%)  Stairs            Wheelchair Mobility    Modified Rankin (Stroke Patients Only)       Balance                                             Pertinent Vitals/Pain Pain Assessment: 0-10 Pain Score: 0-No pain(progressed to 5/10 during walking) Pain Location: RUQ Pain Descriptors / Indicators: Aching;Sore Pain Intervention(s): Monitored during session    Home Living Family/patient expects to be discharged to:: Private residence(living of [facility] @ [facility]) Living Arrangements: Spouse/significant other Available Help at Discharge: Family(husband had event while visiting and had to be taken to ED) Type of Home: Apartment Home Access: Level entry     Home Layout: One level Home Equipment: Probation officer)      Prior Function  Level of Independence: Independent with assistive device(s)         Comments: rollator for ambulation per pt     Hand Dominance   Dominant Hand: Right    Extremity/Trunk Assessment   Upper Extremity Assessment Upper Extremity Assessment: Overall WFL for tasks assessed    Lower Extremity Assessment Lower Extremity Assessment: RLE deficits/detail;LLE deficits/detail RLE Deficits / Details: 4-/5 knee/ hip, 4/5 for ankle LLE Deficits / Details: 4-/5 knee/  hip, 4/5 for ankle       Communication   Communication: No difficulties  Cognition Arousal/Alertness: Awake/alert Behavior During Therapy: WFL for tasks assessed/performed Overall Cognitive Status: Within Functional Limits for tasks assessed                                        General Comments      Exercises Other Exercises Other Exercises: glute sets 2 x 10 holding 3 set Other Exercises: ankle pumps 2 x 10 bil   Assessment/Plan    PT Assessment Patient needs continued PT services  PT Problem List Decreased strength;Decreased activity tolerance;Decreased balance;Decreased mobility;Pain       PT Treatment Interventions Therapeutic exercise;Therapeutic activities;Gait training;DME instruction    PT Goals (Current goals can be found in the Care Plan section)  Acute Rehab PT Goals Patient Stated Goal: to get well enough to go home PT Goal Formulation: With patient Time For Goal Achievement: 08/31/17 Potential to Achieve Goals: Good    Frequency Min 2X/week   Barriers to discharge Decreased caregiver support(husband was taken to ED due to collapsing while visiting) but pt reports she has family assist her    Co-evaluation               AM-PAC PT "6 Clicks" Daily Activity  Outcome Measure Difficulty turning over in bed (including adjusting bedclothes, sheets and blankets)?: A Lot Difficulty moving from lying on back to sitting on the side of the bed? : A Lot Difficulty sitting down on and standing up from a chair with arms (e.g., wheelchair, bedside commode, etc,.)?: A Lot Help needed moving to and from a bed to chair (including a wheelchair)?: A Lot Help needed walking in hospital room?: A Little Help needed climbing 3-5 steps with a railing? : Total 6 Click Score: 12    End of Session Equipment Utilized During Treatment: Gait belt;Oxygen Activity Tolerance: Patient tolerated treatment well;Patient limited by fatigue;Patient limited by  pain Patient left: in chair;with call bell/phone within reach;with family/visitor present Nurse Communication: Mobility status(pt's response throughout session) PT Visit Diagnosis: Unsteadiness on feet (R26.81);Pain;Muscle weakness (generalized) (M62.81);Difficulty in walking, not elsewhere classified (R26.2) Pain - Right/Left: Right Pain - part of body: (abdominal region)    Time: 9794-8016 PT Time Calculation (min) (ACUTE ONLY): 33 min   Charges:   PT Evaluation $PT Eval Low Complexity: 1 Low PT Treatments $Gait Training: 8-22 mins $Therapeutic Exercise: 8-22 mins   PT G Codes:        Martice Doty PT, DPT, LAT, ATC  08/17/17  10:08 AM       Ozetta Flatley, Cheryle Horsfall 08/17/2017, 10:08 AM

## 2017-08-17 NOTE — Progress Notes (Signed)
START OFF PATHWAY REGIMEN - [Other Dx]   [id number]:Carboplatin AUC=4 D1 + Gemcitabine 1,000 mg/m2 D1, 8 q21 Days:   A cycle is every 21 days:     Carboplatin      Gemcitabine   **Always confirm dose/schedule in your pharmacy ordering system**  Patient Characteristics: Intent of Therapy: Non-Curative / Palliative Intent, Discussed with Patient

## 2017-08-17 NOTE — Progress Notes (Signed)
Hematology/Oncology Progress Note [facility] [phone] [phone]  Patient Care Team: Rubye Beach as PCP - General (Family Medicine) Bary Castilla Forest Gleason, MD (General Surgery) Earlie Server, MD as Consulting Physician (Oncology)   Name of the patient: Norma Johns  [id number]  1940-05-11  Date of visit: 08/17/17   INTERVAL HISTORY-  Patient was sitting reports feeling comfortable.  Pain is controlled.  The pain is good.  Reports severe constipated last bowel movement about a week ago.  Review of Systems  Constitutional: Positive for malaise/fatigue. Negative for chills and fever.  HENT: Negative for nosebleeds.   Eyes: Negative for photophobia.  Respiratory: Negative for cough, shortness of breath and wheezing.   Cardiovascular: Negative for chest pain.  Gastrointestinal: Positive for constipation. Negative for nausea and vomiting.  Genitourinary: Negative for dysuria.  Musculoskeletal: Negative for myalgias.  Neurological: Negative for dizziness, sensory change, speech change and focal weakness.  Psychiatric/Behavioral: Negative for depression.     Allergies  Allergen Reactions  . Chocolate Shortness Of Breath  . Other Shortness Of Breath and Swelling    PT STATES ALLERGY TO "SOME TYPE OF DYE THAT WAS USED AT [facility]."  SWELLING TO ARM AND SOB.  PT CANNOT REMEMBER WHAT THE DYE WAS USED FOR.  . Sulfa Antibiotics Other (See Comments)    unknown  . Atorvastatin Other (See Comments)    Leg pain   . Minocycline Hcl Itching  . Naproxen Other (See Comments)    Indigestion  . Niacin Other (See Comments)    Felt like she was on fire  . Septra [Sulfamethoxazole-Trimethoprim] Other (See Comments)    unknown  . Vioxx [Rofecoxib] Other (See Comments)    unknown  . Penicillins Itching and Rash    Has patient had a PCN reaction causing immediate rash, facial/tongue/throat swelling, SOB or lightheadedness with  hypotension: Unknown Has patient had a PCN reaction causing severe rash involving mucus membranes or skin necrosis: Unknown Has patient had a PCN reaction that required hospitalization: No Has patient had a PCN reaction occurring within the last 10 years: No If all of the above answers are "NO", then may proceed with Cephalosporin use.     Patient Active Problem List   Diagnosis Date Noted  . Acute hypoxemic respiratory failure ([place]) 08/15/2017  . Pleural effusion   . Abdominal carcinomatosis ([place])   . Malignant ascites   . Status post thoracentesis   . Peritoneal carcinomatosis ([place]) 08/14/2017  . Neoplasm related pain 08/14/2017  . Metastatic breast cancer ([place]) 07/25/2017  . Goals of care, counseling/discussion 07/22/2017  . MRSA (methicillin resistant staph aureus) culture positive 04/05/2017  . Lymphedema 02/27/2017  . Shoulder weakness 02/27/2017  . Breast cancer ([place]) 10/01/2016  . Invasive lobular carcinoma of breast, stage 2, left ([place]) 09/18/2016  . Malignant neoplasm of lower-outer quadrant of left breast of female, estrogen receptor positive ([place]) 08/25/2016  . History of CVA with residual deficit 04/17/2016  . Low back pain 06/13/2015  . Anxiety 08/24/2014  . Atrophic vaginitis 08/24/2014  . Body mass index (BMI) of 29.0-29.9 in adult 08/24/2014  . Cyst of nasal sinus 08/24/2014  . Degenerative joint disease 08/24/2014  . OP (osteoporosis) 08/24/2014  . Bulging eyes 08/24/2014  . GERD (gastroesophageal reflux disease) 08/16/2014  . History of colon polyps 09/21/2008  . Chronic recurrent sinusitis 09/17/2008  . Diabetes mellitus, type 2 ([place]) 08/03/2008  . Hypercholesteremia 06/22/2008  . Cannot sleep 03/23/2008  . Allergic rhinitis 01/08/2008  .  Airway hyperreactivity 01/08/2008  . Carotid artery obstruction 01/08/2008  . Clinical depression 01/08/2008  . Benign hypertension 01/08/2008     Past Medical History:  Diagnosis Date  . Anemia   . Anxiety   .  Arthritis   . Asthma    WELL CONTROLLED  . Breast cancer ([place]) 2018   left breast cnacer  . Cancer ([place])    Bilat Mastectomy  . Depression   . GERD (gastroesophageal reflux disease)   . Headache    H/O  . Hyperlipidemia   . Metastatic breast cancer ([place]) 07/25/2017  . Metastatic breast cancer ([place]) 07/25/2017  . Sleep apnea    DOES NOT USE CPAP-COULD NOT TOLERATE  . Stroke [facility]) 2004     Past Surgical History:  Procedure Laterality Date  . ABDOMINAL HYSTERECTOMY    . BLADDER REPAIR    . BREAST BIOPSY Right 03/23/1991   Fibroadenoma with intramammary lymph node. Right breast, 9:00.  Marland Kitchen CHOLECYSTECTOMY    . COLONOSCOPY  2005  . MASTECTOMY MODIFIED RADICAL Left 10/01/2016   Procedure: MASTECTOMY MODIFIED RADICAL;  Surgeon: Robert Bellow, MD;  Location: [facility] ORS;  Service: General;  Laterality: Left;  . MR MRA CAROTID  02/2010   Minimal plaque formation; no significant stenosis. Sx: Syncope  . [place] [facility]  . SENTINEL NODE BIOPSY Right 10/01/2016   Procedure: SENTINEL NODE BIOPSY;  Surgeon: Robert Bellow, MD;  Location: [facility] ORS;  Service: General;  Laterality: Right;  . SIMPLE MASTECTOMY WITH AXILLARY SENTINEL NODE BIOPSY Right 10/01/2016   Procedure: SIMPLE MASTECTOMY;  Surgeon: Robert Bellow, MD;  Location: [facility] ORS;  Service: General;  Laterality: Right;    Social History   Socioeconomic History  . Marital status: Married    Spouse name: Not on file  . Number of children: 2  . Years of education: Not on file  . Highest education level: 12th grade  Occupational History  . Occupation: retired  Scientific laboratory technician  . Financial resource strain: Very hard  . Food insecurity:    Worry: Never true    Inability: Never true  . Transportation needs:    Medical: No    Non-medical: No  Tobacco Use  . Smoking status: Never Smoker  . Smokeless tobacco: Never Used  Substance and Sexual Activity  . Alcohol use: No  . Drug use: No  . Sexual  activity: Never  Lifestyle  . Physical activity:    Days per week: Not on file    Minutes per session: Not on file  . Stress: Very much  Relationships  . Social connections:    Talks on phone: Not on file    Gets together: Not on file    Attends religious service: Not on file    Active member of club or organization: Not on file    Attends meetings of clubs or organizations: Not on file    Relationship status: Not on file  . Intimate partner violence:    Fear of current or ex partner: No    Emotionally abused: No    Physically abused: No    Forced sexual activity: No  Other Topics Concern  . Not on file  Social History Narrative  . Not on file     Family History  Problem Relation Age of Onset  . Cancer Father   . Prostate cancer Father   . Heart attack Mother   . Hypertension Mother   . CAD Mother   .  Hypertension Son   . Diabetes Son   . Lung cancer Brother   . Leukemia Brother   . Lung cancer Brother   . Prostate cancer Brother   . Breast cancer Neg Hx      Current Facility-Administered Medications:  .  acetaminophen (TYLENOL) tablet 650 mg, 650 mg, Oral, Q6H PRN **OR** acetaminophen (TYLENOL) suppository 650 mg, 650 mg, Rectal, Q6H PRN, [place] Cipro, Prasanna, MD .  albuterol (PROVENTIL) (2.5 MG/3ML) 0.083% nebulizer solution 2.5 mg, 2.5 mg, Nebulization, Q4H PRN, Mar Daring, PA-C .  ALPRAZolam (XANAX) tablet 0.5 mg, 0.5 mg, Oral, QHS PRN, Arta Silence, MD, 0.5 mg at 08/16/17 1259 .  bisacodyl (DULCOLAX) EC tablet 10 mg, 10 mg, Oral, Daily PRN, Gladstone Lighter, MD .  enoxaparin (LOVENOX) injection 40 mg, 40 mg, Subcutaneous, Q24H, Jodell Cipro, Prasanna, MD, 40 mg at 08/17/17 0610 .  feeding supplement (ENSURE ENLIVE) (ENSURE ENLIVE) liquid 237 mL, 237 mL, Oral, BID BM, Gladstone Lighter, MD, 237 mL at 08/17/17 0810 .  fentaNYL (DURAGESIC - dosed mcg/hr) 37.5 mcg, 37.5 mcg, Transdermal, Q72H, Gladstone Lighter, MD, 37.5 mcg at 08/16/17 1511 .   HYDROmorphone (DILAUDID) injection 1 mg, 1 mg, Intravenous, Q3H PRN **OR** [DISCONTINUED] HYDROmorphone (DILAUDID) injection 2 mg, 2 mg, Intravenous, Q3H PRN, Jodell Cipro, Prasanna, MD .  ipratropium-albuterol (DUONEB) 0.5-2.5 (3) MG/3ML nebulizer solution 3 mL, 3 mL, Nebulization, Q6H, Burn[place], Jennifer M, PA-C, 3 mL at 08/17/17 1930 .  lactulose (CHRONULAC) 10 GM/15ML solution 20 g, 20 g, Oral, BID PRN, Gladstone Lighter, MD, 20 g at 08/17/17 1135 .  multivitamin with minerals tablet 1 tablet, 1 tablet, Oral, Daily, Arta Silence, MD, 1 tablet at 08/17/17 0810 .  ondansetron (ZOFRAN) injection 4 mg, 4 mg, Intravenous, Q6H PRN, Arta Silence, MD, 4 mg at 08/17/17 1722 .  oxyCODONE (Oxy IR/ROXICODONE) immediate release tablet 10 mg, 10 mg, Oral, Q4H PRN, Earlie Server, MD .  pantoprazole (PROTONIX) EC tablet 40 mg, 40 mg, Oral, BID AC, Arta Silence, MD, 40 mg at 08/17/17 1639 .  polyethylene glycol (MIRALAX / GLYCOLAX) packet 17 g, 17 g, Oral, Daily, Gladstone Lighter, MD, 17 g at 08/17/17 0810 .  promethazine (PHENERGAN) injection 12.5 mg, 12.5 mg, Intravenous, Q6H PRN, Jodell Cipro, Prasanna, MD, 12.5 mg at 08/15/17 0358 .  senna-docusate (Senokot-S) tablet 2 tablet, 2 tablet, Oral, QHS, Gladstone Lighter, MD, Stopped at 08/16/17 2327 .  vitamin B-12 (CYANOCOBALAMIN) tablet 500 mcg, 500 mcg, Oral, Daily, Jodell Cipro, Prasanna, MD, 500 mcg at 08/17/17 1135 .  witch hazel-glycerin (TUCKS) pad, , Topical, PRN, Gladstone Lighter, MD   Physical exam:  Vitals:   08/17/17 0732 08/17/17 1415 08/17/17 1723 08/17/17 1932  BP:  136/74 (!) 162/77   Pulse:  91 (!) 101 96  Resp:  20  18  Temp:  98.5 F (36.9 C)    TempSrc:  Oral    SpO2: 92% 98%  94%  Weight:      Height:       Physical Exam  Constitutional: She is oriented to person, place, and time. No distress.  HENT:  Head: Normocephalic and atraumatic.  Eyes: Pupils are equal, round, and reactive to light. EOM are normal. No  scleral icterus.  Neck: Normal range of motion. Neck supple.  Cardiovascular: Normal rate and normal heart sounds.  No murmur heard. Pulmonary/Chest: Effort normal. No respiratory distress.  Abdominal: Bowel sounds are normal. She exhibits distension. There is no tenderness. There is no rebound.  Musculoskeletal: Normal range of motion. She exhibits no edema.  Neurological: She is alert and oriented to person, place, and time.  Skin: Skin is warm and dry. No erythema.  Psychiatric: Affect normal.       CMP Latest Ref Rng & Units 08/17/2017  Glucose 65 - 99 mg/dL 127(H)  BUN 6 - 20 mg/dL 9  Creatinine 0.44 - 1.00 mg/dL 0.55  Sodium 135 - 145 mmol/L 133(L)  Potassium 3.5 - 5.1 mmol/L 4.1  Chloride 101 - 111 mmol/L 100(L)  CO2 22 - 32 mmol/L 26  Calcium 8.9 - 10.3 mg/dL 9.1  Total Protein 6.5 - 8.1 g/dL 6.3(L)  Total Bilirubin 0.3 - 1.2 mg/dL 0.6  Alkaline Phos 38 - 126 U/L 51  AST 15 - 41 U/L 18  ALT 14 - 54 U/L 14   CBC Latest Ref Rng & Units 08/17/2017  WBC 3.6 - 11.0 K/uL 9.6  Hemoglobin 12.0 - 16.0 g/dL 11.4(L)  Hematocrit 35.0 - 47.0 % 34.2(L)  Platelets 150 - 440 K/uL 343  RADIOGRAPHIC STUDIES: I have personally reviewed the radiological images as listed and agreed with the findings in the report. Dg Chest 2 View  Result Date: 07/30/2017 CLINICAL DATA:  Shortness of breath and near syncope. History of breast cancer. EXAM: CHEST - 2 VIEW COMPARISON:  CT chest dated Jul 17, 2017. Chest x-ray dated December 31, 2012. FINDINGS: The heart size and mediastinal contours are within normal limits. Mild interstitial prominence bilaterally. Small left pleural effusion with left lower lobe atelectasis. No pneumothorax. No acute osseous abnormality. IMPRESSION: 1. Unchanged small left pleural effusion with adjacent left lower lobe atelectasis. Electronically Signed   By: Titus Dubin M.D.   On: 07/30/2017 16:06   Dg Abdomen 1 View  Result Date: 07/30/2017 CLINICAL DATA:  77 year old  female with near syncopal episode abdominal pain post chest CT today. EXAM: ABDOMEN - 1 VIEW COMPARISON:  07/30/2017 chest CT.  07/17/2017 CT abdomen and pelvis. FINDINGS: Residual contrast in renal collecting system/bladder. Gas-filled top-normal size colon splenic flexure level. Nonspecific gas collection right upper quadrant. Findings similar to scout view of recent chest CT. CT detected ascites and peritoneal carcinomatosis not adequately assessed by plain film exam. IMPRESSION: Gas-filled top-normal size colon splenic flexure level. Nonspecific gas collection right upper quadrant. Findings similar to scout view of recent chest CT. CT detected ascites and peritoneal carcinomatosis not adequately assessed by plain film exam. Electronically Signed   By: Genia Del M.D.   On: 07/30/2017 20:19   Ct Chest W Contrast  Result Date: 08/15/2017 CLINICAL DATA:  77 y/o F; history of breast cancer with metastasis to the stomach presenting with right-sided abdominal pain. EXAM: CT CHEST WITH CONTRAST TECHNIQUE: Multidetector CT imaging of the chest was performed during intravenous contrast administration. CONTRAST:  111mL ISOVUE-370 IOPAMIDOL (ISOVUE-370) INJECTION 76% COMPARISON:  07/30/2017 CT of the chest. 08/11/2017 CT abdomen and pelvis. FINDINGS: Cardiovascular: No significant vascular findings. Normal heart size. No pericardial effusion. Mediastinum/Nodes: No lymphadenopathy by imaging criteria. Thyroid gland, trachea, and esophagus demonstrate no significant findings. Lungs/Pleura: There are several stable subcentimeter pulmonary nodules at the periphery of the right upper lobe and middle lobe. Interval development of consolidations within the lower lobes bilaterally, hazy ground-glass opacities of the upper lobes, and interlobular septal thickening. Increased moderate bilateral pleural effusions. Upper Abdomen: Small volume of ascites. Prominent gastrohepatic lymph nodes better characterized on prior CT of  abdomen and pelvis. Cholecystectomy. Musculoskeletal: No chest wall abnormality. No acute or significant osseous findings. IMPRESSION: 1. Increased moderate bilateral pleural effusions, hazy ground-glass opacities  of the lungs, and smooth interlobular septal thickening. Findings probably represent pulmonary edema. 2. Dependent lower lobe consolidations probably represent atelectasis. Underlying pneumonia is possible. 3. Several stable subcentimeter pulmonary nodules at the periphery of the right upper lobe and middle lobe. Lower lobe nodules are obscured by consolidation. Consider reassessment for pulmonary metastatic disease after resolution of acute airspace disease and effusions. Electronically Signed   By: Kristine Garbe M.D.   On: 08/15/2017 03:07   Ct Angio Chest Pe W And/or Wo Contrast  Result Date: 07/30/2017 CLINICAL DATA:  PE suspected, intermediate prob, positive D-dimer. Acute onset of shortness of breath earlier today when here for a bone scan. Abdominal pain. EXAM: CT ANGIOGRAPHY CHEST WITH CONTRAST TECHNIQUE: Multidetector CT imaging of the chest was performed using the standard protocol during bolus administration of intravenous contrast. Multiplanar CT image reconstructions and MIPs were obtained to evaluate the vascular anatomy. CONTRAST:  4mL ISOVUE-370 IOPAMIDOL (ISOVUE-370) INJECTION 76% COMPARISON:  Chest and abdominal CT 07/17/2017 FINDINGS: Cardiovascular: There are no filling defects within the pulmonary arteries to suggest pulmonary embolus. Tortuosity of the thoracic aorta without dissection. Common origin of the left common carotid and brachiocephalic artery, normal variant arch configuration. Upper normal heart size. No pericardial effusion. Mediastinum/Nodes: Increase size of right hilar lymph nodes from prior exam currently 10 and 11 mm short axis, previously 7 mm. Rounded fluid density structure adjacent to the lower esophagus likely represents loculated ascites rather  than paraesophageal node. Possible 5 mm right internal mammary node versus adjacent chondral cartilage. Small anterior right epicardial nodes are unchanged. No axillary adenopathy. Post bilateral mastectomy. Lungs/Pleura: Evaluation of pulmonary nodules seen on prior exam is suboptimal due to breathing motion artifact. Majority these nodules are small measuring approximately 4 mm, and unchanged. However a right lower lobe nodule image 49 series 6 has increased in size currently 7 mm, previously 4 mm. The diaphragmatic nodule in the left lower lobe on prior exam is currently obscured. Left pleural effusion is increased, now small to moderate. There is adjacent compressive atelectasis. Tiny right pleural effusion. Minimal patchy ground-glass opacities in the right lower and left upper lobes are likely atelectasis. Upper Abdomen: Increased upper abdominal ascites since prior abdominal CT, moderate in degree. Musculoskeletal: There are no acute or suspicious osseous abnormalities. Degenerative change in the spine. Vertebral body hemangioma in T3, not hypermetabolic and bone scan earlier today. Review of the MIP images confirms the above findings. IMPRESSION: 1. No pulmonary embolus. 2. Increased left pleural effusion development of small right pleural effusion since exam 2 weeks ago. Increased upper abdominal ascites. 3. Small pulmonary nodules, majority of which are unchanged or not as well evaluated, however definite increase size of a right lower lobe pulmonary nodule over the past 2 weeks suggesting progression of metastatic disease. 4. New borderline right hilar lymph nodes measuring up to 11 mm short axis, nonspecific in the setting for reactive versus metastatic. Electronically Signed   By: Jeb Levering M.D.   On: 07/30/2017 19:08   Mr Pelvis W Wo Contrast  Result Date: 08/16/2017 CLINICAL DATA:  History of breast carcinoma. No adjuvant chemotherapy. Patient presents with peritoneal carcinomatosis within  the abdomen on CT 08/11/2017. Evaluate for source of carcinomatosis EXAM: MRI ABDOMEN AND PELVIS WITHOUT AND WITH CONTRAST TECHNIQUE: Multiplanar multisequence MR imaging of the abdomen and pelvis was performed both before and after the administration of intravenous contrast. CONTRAST:  29mL MULTIHANCE GADOBENATE DIMEGLUMINE 529 MG/ML IV SOLN COMPARISON:  CT 08/11/2017 FINDINGS: COMBINED FINDINGS FOR BOTH  MR ABDOMEN AND PELVIS Lower chest: Bilateral large pleural effusion and passive atelectasis. Hepatobiliary: No focal hepatic lesion. Postcontrast imaging is degraded by respiratory motion. Moderate volume of free fluid along the margin of the liver. Pancreas: The pancreas is grossly normal. Motion degradation. No mass lesion identified Spleen:  Normal spleen Adrenals/Urinary Tract: Adrenal glands and kidneys are normal. No obstruction. Normal bladder Stomach/Bowel: Stomach common duodenum normal. Bowel is unremarkable. MRI is not well suited to evaluate the bowel. Vascular/Lymphatic: Abdominal aorta normal caliber. No retroperitoneal periportal lymphadenopathy. Reproductive: Post hysterectomy anatomy Other: Peritoneal carcinomatosis again demonstrated. There is nodular thickening in the greater omentum. The nodularity in the pelvic sidewalls is better depicted on CT imaging. The some nodular noted on image 66/8. Musculoskeletal: No aggressive osseous lesion. IMPRESSION: 1. Peritoneal carcinomatosis better imaged on comparison CT. 2. No primary source of carcinomatosis identified. Exam is degraded by respiratory motion. 3. Moderate intraperitoneal free fluid and large bilateral pleural effusions. Electronically Signed   By: Suzy Bouchard M.D.   On: 08/16/2017 16:08   Mr Abdomen W Wo Contrast  Result Date: 08/16/2017 CLINICAL DATA:  History of breast carcinoma. No adjuvant chemotherapy. Patient presents with peritoneal carcinomatosis within the abdomen on CT 08/11/2017. Evaluate for source of carcinomatosis  EXAM: MRI ABDOMEN AND PELVIS WITHOUT AND WITH CONTRAST TECHNIQUE: Multiplanar multisequence MR imaging of the abdomen and pelvis was performed both before and after the administration of intravenous contrast. CONTRAST:  68mL MULTIHANCE GADOBENATE DIMEGLUMINE 529 MG/ML IV SOLN COMPARISON:  CT 08/11/2017 FINDINGS: COMBINED FINDINGS FOR BOTH MR ABDOMEN AND PELVIS Lower chest: Bilateral large pleural effusion and passive atelectasis. Hepatobiliary: No focal hepatic lesion. Postcontrast imaging is degraded by respiratory motion. Moderate volume of free fluid along the margin of the liver. Pancreas: The pancreas is grossly normal. Motion degradation. No mass lesion identified Spleen:  Normal spleen Adrenals/Urinary Tract: Adrenal glands and kidneys are normal. No obstruction. Normal bladder Stomach/Bowel: Stomach common duodenum normal. Bowel is unremarkable. MRI is not well suited to evaluate the bowel. Vascular/Lymphatic: Abdominal aorta normal caliber. No retroperitoneal periportal lymphadenopathy. Reproductive: Post hysterectomy anatomy Other: Peritoneal carcinomatosis again demonstrated. There is nodular thickening in the greater omentum. The nodularity in the pelvic sidewalls is better depicted on CT imaging. The some nodular noted on image 66/8. Musculoskeletal: No aggressive osseous lesion. IMPRESSION: 1. Peritoneal carcinomatosis better imaged on comparison CT. 2. No primary source of carcinomatosis identified. Exam is degraded by respiratory motion. 3. Moderate intraperitoneal free fluid and large bilateral pleural effusions. Electronically Signed   By: Suzy Bouchard M.D.   On: 08/16/2017 16:08   Nm Bone Scan Whole Body  Result Date: 07/30/2017 CLINICAL DATA:  Breast cancer.  Restaging.  Vomiting from pain. EXAM: NUCLEAR MEDICINE WHOLE BODY BONE SCAN TECHNIQUE: Whole body anterior and posterior images were obtained approximately 3 hours after intravenous injection of radiopharmaceutical.  RADIOPHARMACEUTICALS:  21.4 mCi Technetium-67m MDP IV COMPARISON:  Bone scan 10/30/2016, PET-CT scan 08/30/2016 FINDINGS: No focal uptake in the axillary or appendicular skeleton to localize skeletal metastasis. Diffuse uptake in the anterior calvarium is similar comparison exam in favored physiologic or metabolic. IMPRESSION: No scintigraphic evidence skeletal metastasis. Electronically Signed   By: Suzy Bouchard M.D.   On: 07/30/2017 16:58   Ct Abdomen Pelvis W Contrast  Result Date: 08/11/2017 CLINICAL DATA:  Increase weakness and poor appetite. Vomiting for 3 days. Metastatic breast cancer. EXAM: CT ABDOMEN AND PELVIS WITH CONTRAST TECHNIQUE: Multidetector CT imaging of the abdomen and pelvis was performed using the standard protocol  following bolus administration of intravenous contrast. CONTRAST:  145mL ISOVUE-300 IOPAMIDOL (ISOVUE-300) INJECTION 61% COMPARISON:  08/07/2017 FINDINGS: Lower chest: There are moderate bilateral pleural effusions, left greater than right. Increased from previous exam. Passive atelectasis within the lower lobes noted. Small pulmonary nodules are identified bilaterally compatible with metastatic disease. Hepatobiliary: There is been previous cholecystectomy. No suspicious liver abnormality identified. Pancreas: Pancreas normal. Spleen: The spleen appears normal. Adrenals/Urinary Tract: Normal adrenal glands. No kidney mass or hydronephrosis identified bilaterally. Urinary bladder is unremarkable. Stomach/Bowel: The stomach appears nondistended. The small bowel loops have a normal caliber without obstruction. No pathologic dilatation of the colon. Distal colonic diverticulosis is identified without acute diverticulitis. Vascular/Lymphatic: Normal appearance of the abdominal aorta. Progressive adenopathy within the gastrohepatic and porta hepatis region. Index lymph node measures 1 cm, image 26/2. Previously 0.7 cm. No retroperitoneal adenopathy. No pelvic or inguinal  adenopathy. Reproductive: Status post hysterectomy.  No adnexal mass. Other: There is a moderate volume of ascites within the abdomen and pelvis which has increased from 07/17/2017. Peritoneal carcinomatosis with omental caking is identified. Peritoneal lesion in the left upper quadrant of the abdomen measures 3.6 cm, image 34/2. Previously 3.5 cm the omental cake has a maximum thickness of 2.7 cm, image 58/2. Previously 2.3 cm. Musculoskeletal: Degenerative disc disease within the lower thoracic and lumbar spine. No suspicious bone lesions identified. IMPRESSION: 1. No acute findings identified within the abdomen or pelvis. 2. Peritoneal carcinomatosis with increase in volume of ascites with mild increase in size of peritoneal lesions and omental caking. 3. Bilateral pleural effusions are increased in volume from previous exam. Small pulmonary nodules within the lungs compatible with metastatic disease. Electronically Signed   By: Kerby Moors M.D.   On: 08/11/2017 18:08   US Abdomen Limited  Result Date: 08/15/2017 CLINICAL DATA:  Metastatic breast carcinoma. Abdominal carcinomatosis. Bilateral pleural effusions and ascites noted on recent CT chest. Paracentesis requested. EXAM: LIMITED ABDOMEN ULTRASOUND FOR ASCITES TECHNIQUE: Limited ultrasound survey for ascites was performed in all four abdominal quadrants. COMPARISON:  CT 08/15/2017 FINDINGS: There is a small amount of scattered ascites, predominantly in the low pelvis. No large pocket to allow safe paracentesis. IMPRESSION: 1. Small amount of scattered pelvic ascites. Findings discussed with patient. Paracentesis deferred. Electronically Signed   By: Lucrezia Europe M.D.   On: 08/15/2017 10:46   Ct Biopsy  Result Date: 08/07/2017 INDICATION: Breast cancer, peritoneal carcinomatosis, ascites, concern for metastatic disease EXAM: CT-GUIDED BIOPSY ANTERIOR OMENTAL CARCINOMATOSIS MEDICATIONS: 1% lidocaine local ANESTHESIA/SEDATION: 1.5 mg IV Versed; 50 mcg  IV Fentanyl Moderate Sedation Time:  13 minutes The patient was continuously monitored during the procedure by the interventional radiology nurse under my direct supervision. PROCEDURE: The procedure, risks, benefits, and alternatives were explained to the patient. Questions regarding the procedure were encouraged and answered. The patient understands and consents to the procedure. Previous imaging reviewed. Patient positioned supine. Noncontrast localization CT performed. The lower anterior omental carcinomatosis was localized. Overlying skin marked for a anterior right lateral approach. Under sterile conditions and local anesthesia, CT guidance was utilized to advance a 17 gauge 11.8 cm lesion to the omental carcinomatosis. Needle position confirmed with CT. 18 gauge core biopsies obtained and placed in formalin. Needle removed. Postprocedure imaging demonstrates no hemorrhage or hematoma. Patient tolerated the procedure well without complication. Vital sign monitoring by nursing staff during the procedure will continue as patient is in the special procedures unit for post procedure observation. FINDINGS: The images document guide needle placement within the anterior  omental mass. Post biopsy images demonstrate no hemorrhage or hematoma. COMPLICATIONS: None immediate. IMPRESSION: Successful CT-guided core biopsy of the anterior omental mass. Electronically Signed   By: Jerilynn Mages.  Shick M.D.   On: 08/07/2017 11:40   Dg Chest Port 1 View  Result Date: 08/15/2017 CLINICAL DATA:  S/p Thoracentesis- left side EXAM: PORTABLE CHEST - 1 VIEW COMPARISON:  the previous day's study FINDINGS: No pneumothorax. Small bilateral pleural effusions, decreased on the left since prior study. Improved aeration at the left lung base. Heart size and mediastinal contours are within normal limits. Low lung volumes with prominent perihilar bronchovascular markings. Some linear scarring or subsegmental atelectasis in both lung bases.  Visualized bones unremarkable.  Cholecystectomy clips. IMPRESSION: 1. No pneumothorax post left thoracentesis, with improvement in left lower lung consolidation/atelectasis. 2. Small residual bilateral pleural effusions. Electronically Signed   By: Lucrezia Europe M.D.   On: 08/15/2017 10:48   Dg Chest Portable 1 View  Result Date: 08/15/2017 CLINICAL DATA:  Breast cancer with gastric metastases. Right-sided abdominal pain. Shortness of breath. EXAM: PORTABLE CHEST 1 VIEW COMPARISON:  07/30/2017 FINDINGS: Shallow inspiration. Cardiac enlargement. Pulmonary vascularity is normal. There is increasing left and developing right pleural effusion with basilar atelectasis or consolidation. Interstitial pattern to the lungs could reflect interstitial edema or interstitial metastasis. No pneumothorax. Mediastinal contours appear intact. IMPRESSION: Cardiac enlargement. Increasing left and developing right pleural effusion with basilar atelectasis or consolidation. Interstitial pattern to the lungs may indicate interstitial edema or metastasis. Electronically Signed   By: Lucienne Capers M.D.   On: 08/15/2017 01:26   [place] Ascites (abdomen Limited)  Result Date: 07/25/2017 CLINICAL DATA:  History of breast cancer, now with peritoneal carcinomatosis. Please perform evaluate abdomen for malignant ascites and perform ultrasound-guided paracentesis as indicated. EXAM: LIMITED ABDOMEN ULTRASOUND FOR ASCITES TECHNIQUE: Limited ultrasound survey for ascites was performed in all four abdominal quadrants. COMPARISON:  CT of the chest, abdomen and pelvis-07/17/2017 FINDINGS: Sonographic evaluation performed by the dictating interventional radiologist demonstrates only a trace amount of intra-abdominal ascites located within left upper abdominal quadrant (image 5). IMPRESSION: Trace amount of intra-abdominal ascites located within the left upper abdominal quadrant, too small to allow for safe ultrasound-guided paracentesis.  Electronically Signed   By: Sandi Mariscal M.D.   On: 07/25/2017 14:06   US Thoracentesis Asp Pleural Space W/img Guide  Result Date: 08/15/2017 INDICATION: Bilateral pleural effusions.  Metastatic breast carcinoma. EXAM: ULTRASOUND GUIDED LEFT THORACENTESIS MEDICATIONS: LIDOCAINE 1% SUBCUTANEOUS COMPLICATIONS: None immediate. PROCEDURE: An ultrasound guided thoracentesis was thoroughly discussed with the patient and questions answered. The benefits, risks, alternatives and complications were also discussed. The patient understands and wishes to proceed with the procedure. Written consent was obtained. Survey ultrasound demonstrated bilateral pleural effusions, left greater than right. Ultrasound was performed to localize and mark an adequate pocket of fluid in the left chest. The area was then prepped and draped in the normal sterile fashion. 1% Lidocaine was used for local anesthesia. Under ultrasound guidance a Safe-T-Centesis catheter was introduced. Thoracentesis was performed. The catheter was removed and a dressing applied. FINDINGS: A total of approximately 500 mL of clear amber fluid was removed. Samples were sent to the laboratory as requested by the clinical team. IMPRESSION: Successful ultrasound guided left thoracentesis yielding 500 mL of pleural fluid. Follow-up chest radiograph shows no pneumothorax. Electronically Signed   By: Lucrezia Europe M.D.   On: 08/15/2017 10:45    Assessment and plan-  Patient is a 77 y.o. female with history of  stage IIIb breast cancer, status post mastectomy and axillary lymph node dissection, recently discovered peritoneal carcinomatosis presented to emergency room complaining shortness of breath, intractable nausea vomiting, general weakness and abdominal pain.  #Peritoneal carcinomatosis, adenocarcinoma of unknown origin MRI did not provide additional information no primary was discovered. Case was discussed on tumor board.  Adenocarcinoma is positive for CDX 2,  most likely GI or pancreatic origin. Tumor markers including CEA, CA-19-9, CA-125 nonconclusive.  All mildly elevated. Cancer type test pending.  Genetic testing was sent. Recommend outpatient palliative chemotherapy, consider Carboplatin and Gemcitabine.  Patient switches mind between No chemotherapy/comfort care or palliative chemotherapy.  Prior to this admission, she has made up her mind willing to try chemotherapy.  Today she had questions about her MRI results, and chemotherapy options for carcinoma option.  She feels that she is not sure about her decision of chemotherapy again.I had a lengthy discussion with patient, and explained the rationale of palliative chemotherapy which can reduce her symptoms and potentially prolong her life.  If she does not want chemotherapy treatment, I will recommend hospice.  I tried my best to answer all her questions.  Patient appreciate explanation and willing to proceed with treatment.   #Neoplasm related pain, currently on fentanyl patch 37.5 mcg, oxycodone 10 mg Q4 hours as needed.  Pain is better controlled. She has appointment for Mediport placement by Dr. Marlou Starks on Monday.  Total face to face encounter time for this patient visit was 35 min. >50% of the time was  spent in counseling and coordination of care.   Earlie Server, MD, PhD Hematology Oncology [facility] Pager- [phone] 08/17/2017

## 2017-08-18 ENCOUNTER — Inpatient Hospital Stay: Payer: Medicare Other

## 2017-08-18 MED ORDER — HYDROCORTISONE 2.5 % RE CREA
TOPICAL_CREAM | Freq: Two times a day (BID) | RECTAL | Status: DC
  Administered 2017-08-18 – 2017-08-20 (×4): via RECTAL
  Filled 2017-08-18: qty 28.35

## 2017-08-18 NOTE — Plan of Care (Signed)
  Problem: Education: Goal: Knowledge of General Education information will improve Outcome: Progressing   Problem: Health Behavior/Discharge Planning: Goal: Ability to manage health-related needs will improve Outcome: Progressing   Problem: Clinical Measurements: Goal: Cardiovascular complication will be avoided Outcome: Progressing   Problem: Pain Managment: Goal: General experience of comfort will improve Outcome: Progressing   Problem: Safety: Goal: Ability to remain free from injury will improve Outcome: Progressing   Problem: Skin Integrity: Goal: Risk for impaired skin integrity will decrease Outcome: Progressing

## 2017-08-18 NOTE — Progress Notes (Signed)
Hematology/Oncology Progress Note [facility] [phone] [phone]  Patient Care Team: Rubye Beach as PCP - General (Family Medicine) Bary Castilla Forest Gleason, MD (General Surgery) Earlie Server, MD as Consulting Physician (Oncology)   Name of the patient: Norma Johns  [id number]  04-16-1940  Date of visit: 08/18/17   INTERVAL HISTORY-  Patient reports feeling more shortness of breath last night and has to increase oxygen.  Currently on 4.5 L nasal cannula.  Still feels urges to go for bowel movement can have not be able to had any bowel movement despite aggressive bowel regimen.  KUB and chest x-ray has been ordered.  Review of Systems  Constitutional: Positive for malaise/fatigue. Negative for chills and fever.  HENT: Negative for ear discharge and nosebleeds.   Eyes: Positive for double vision. Negative for photophobia.  Respiratory: Positive for shortness of breath. Negative for cough and wheezing.   Cardiovascular: Negative for chest pain, orthopnea and claudication.  Gastrointestinal: Positive for constipation. Negative for abdominal pain, diarrhea, nausea and vomiting.  Genitourinary: Negative for dysuria.  Musculoskeletal: Negative for myalgias.  Neurological: Negative for dizziness, sensory change, speech change and focal weakness.  Psychiatric/Behavioral: Negative for depression.     Allergies  Allergen Reactions  . Chocolate Shortness Of Breath  . Other Shortness Of Breath and Swelling    PT STATES ALLERGY TO "SOME TYPE OF DYE THAT WAS USED AT [facility]."  SWELLING TO ARM AND SOB.  PT CANNOT REMEMBER WHAT THE DYE WAS USED FOR.  . Sulfa Antibiotics Other (See Comments)    unknown  . Atorvastatin Other (See Comments)    Leg pain   . Minocycline Hcl Itching  . Naproxen Other (See Comments)    Indigestion  . Niacin Other (See Comments)    Felt like she was on fire  . Septra [Sulfamethoxazole-Trimethoprim]  Other (See Comments)    unknown  . Vioxx [Rofecoxib] Other (See Comments)    unknown  . Penicillins Itching and Rash    Has patient had a PCN reaction causing immediate rash, facial/tongue/throat swelling, SOB or lightheadedness with hypotension: Unknown Has patient had a PCN reaction causing severe rash involving mucus membranes or skin necrosis: Unknown Has patient had a PCN reaction that required hospitalization: No Has patient had a PCN reaction occurring within the last 10 years: No If all of the above answers are "NO", then may proceed with Cephalosporin use.     Patient Active Problem List   Diagnosis Date Noted  . Carcinoma of unknown primary ([place])   . Acute hypoxemic respiratory failure ([place]) 08/15/2017  . Pleural effusion   . Abdominal carcinomatosis ([place])   . Malignant ascites   . Status post thoracentesis   . Peritoneal carcinomatosis ([place]) 08/14/2017  . Neoplasm related pain 08/14/2017  . Metastatic breast cancer ([place]) 07/25/2017  . Goals of care, counseling/discussion 07/22/2017  . MRSA (methicillin resistant staph aureus) culture positive 04/05/2017  . Lymphedema 02/27/2017  . Shoulder weakness 02/27/2017  . Breast cancer ([place]) 10/01/2016  . Invasive lobular carcinoma of breast, stage 2, left ([place]) 09/18/2016  . Malignant neoplasm of lower-outer quadrant of left breast of female, estrogen receptor positive ([place]) 08/25/2016  . History of CVA with residual deficit 04/17/2016  . Low back pain 06/13/2015  . Anxiety 08/24/2014  . Atrophic vaginitis 08/24/2014  . Body mass index (BMI) of 29.0-29.9 in adult 08/24/2014  . Cyst of nasal sinus 08/24/2014  . Degenerative joint disease 08/24/2014  . OP (osteoporosis)  08/24/2014  . Bulging eyes 08/24/2014  . GERD (gastroesophageal reflux disease) 08/16/2014  . History of colon polyps 09/21/2008  . Chronic recurrent sinusitis 09/17/2008  . Diabetes mellitus, type 2 ([place]) 08/03/2008  . Hypercholesteremia 06/22/2008  .  Cannot sleep 03/23/2008  . Allergic rhinitis 01/08/2008  . Airway hyperreactivity 01/08/2008  . Carotid artery obstruction 01/08/2008  . Clinical depression 01/08/2008  . Benign hypertension 01/08/2008     Past Medical History:  Diagnosis Date  . Anemia   . Anxiety   . Arthritis   . Asthma    WELL CONTROLLED  . Breast cancer ([place]) 2018   left breast cnacer  . Cancer ([place])    Bilat Mastectomy  . Depression   . GERD (gastroesophageal reflux disease)   . Headache    H/O  . Hyperlipidemia   . Metastatic breast cancer ([place]) 07/25/2017  . Metastatic breast cancer ([place]) 07/25/2017  . Sleep apnea    DOES NOT USE CPAP-COULD NOT TOLERATE  . Stroke [facility]) 2004     Past Surgical History:  Procedure Laterality Date  . ABDOMINAL HYSTERECTOMY    . BLADDER REPAIR    . BREAST BIOPSY Right 03/23/1991   Fibroadenoma with intramammary lymph node. Right breast, 9:00.  Marland Kitchen CHOLECYSTECTOMY    . COLONOSCOPY  2005  . MASTECTOMY MODIFIED RADICAL Left 10/01/2016   Procedure: MASTECTOMY MODIFIED RADICAL;  Surgeon: Robert Bellow, MD;  Location: [facility] ORS;  Service: General;  Laterality: Left;  . MR MRA CAROTID  02/2010   Minimal plaque formation; no significant stenosis. Sx: Syncope  . [facility]  . SENTINEL NODE BIOPSY Right 10/01/2016   Procedure: SENTINEL NODE BIOPSY;  Surgeon: Robert Bellow, MD;  Location: [facility] ORS;  Service: General;  Laterality: Right;  . SIMPLE MASTECTOMY WITH AXILLARY SENTINEL NODE BIOPSY Right 10/01/2016   Procedure: SIMPLE MASTECTOMY;  Surgeon: Robert Bellow, MD;  Location: [facility] ORS;  Service: General;  Laterality: Right;    Social History   Socioeconomic History  . Marital status: Married    Spouse name: Not on file  . Number of children: 2  . Years of education: Not on file  . Highest education level: 12th grade  Occupational History  . Occupation: retired  Scientific laboratory technician  . Financial resource strain: Very hard  . Food  insecurity:    Worry: Never true    Inability: Never true  . Transportation needs:    Medical: No    Non-medical: No  Tobacco Use  . Smoking status: Never Smoker  . Smokeless tobacco: Never Used  Substance and Sexual Activity  . Alcohol use: No  . Drug use: No  . Sexual activity: Never  Lifestyle  . Physical activity:    Days per week: Not on file    Minutes per session: Not on file  . Stress: Very much  Relationships  . Social connections:    Talks on phone: Not on file    Gets together: Not on file    Attends religious service: Not on file    Active member of club or organization: Not on file    Attends meetings of clubs or organizations: Not on file    Relationship status: Not on file  . Intimate partner violence:    Fear of current or ex partner: No    Emotionally abused: No    Physically abused: No    Forced sexual activity: No  Other Topics Concern  .  Not on file  Social History Narrative  . Not on file     Family History  Problem Relation Age of Onset  . Cancer Father   . Prostate cancer Father   . Heart attack Mother   . Hypertension Mother   . CAD Mother   . Hypertension Son   . Diabetes Son   . Lung cancer Brother   . Leukemia Brother   . Lung cancer Brother   . Prostate cancer Brother   . Breast cancer Neg Hx      Current Facility-Administered Medications:  .  acetaminophen (TYLENOL) tablet 650 mg, 650 mg, Oral, Q6H PRN **OR** acetaminophen (TYLENOL) suppository 650 mg, 650 mg, Rectal, Q6H PRN, Jodell Cipro, Prasanna, MD .  albuterol (PROVENTIL) (2.5 MG/3ML) 0.083% nebulizer solution 2.5 mg, 2.5 mg, Nebulization, Q4H PRN, Mar Daring, PA-C .  ALPRAZolam (XANAX) tablet 0.5 mg, 0.5 mg, Oral, QHS PRN, Arta Silence, MD, 0.5 mg at 08/16/17 1259 .  bisacodyl (DULCOLAX) EC tablet 10 mg, 10 mg, Oral, Daily PRN, [place] Lighter, MD .  enoxaparin (LOVENOX) injection 40 mg, 40 mg, Subcutaneous, Q24H, Jodell Cipro, Prasanna, MD, 40 mg at 08/18/17  0626 .  feeding supplement (ENSURE ENLIVE) (ENSURE ENLIVE) liquid 237 mL, 237 mL, Oral, BID BM, [place] Lighter, MD, 237 mL at 08/17/17 0810 .  fentaNYL (DURAGESIC - dosed mcg/hr) 37.5 mcg, 37.5 mcg, Transdermal, Q72H, [place] Lighter, MD, 37.5 mcg at 08/16/17 1511 .  HYDROmorphone (DILAUDID) injection 1 mg, 1 mg, Intravenous, Q3H PRN **OR** [DISCONTINUED] HYDROmorphone (DILAUDID) injection 2 mg, 2 mg, Intravenous, Q3H PRN, Jodell Cipro, Prasanna, MD .  ipratropium-albuterol (DUONEB) 0.5-2.5 (3) MG/3ML nebulizer solution 3 mL, 3 mL, Nebulization, Q6H, [place], Jennifer M, PA-C, 3 mL at 08/18/17 1336 .  lactulose (CHRONULAC) 10 GM/15ML solution 20 g, 20 g, Oral, BID PRN, [place] Lighter, MD, 20 g at 08/17/17 1135 .  multivitamin with minerals tablet 1 tablet, 1 tablet, Oral, Daily, Arta Silence, MD, 1 tablet at 08/18/17 0928 .  ondansetron (ZOFRAN) injection 4 mg, 4 mg, Intravenous, Q6H PRN, Arta Silence, MD, 4 mg at 08/17/17 1722 .  oxyCODONE (Oxy IR/ROXICODONE) immediate release tablet 10 mg, 10 mg, Oral, Q4H PRN, Earlie Server, MD .  pantoprazole (PROTONIX) EC tablet 40 mg, 40 mg, Oral, BID AC, Arta Silence, MD, 40 mg at 08/18/17 0928 .  polyethylene glycol (MIRALAX / GLYCOLAX) packet 17 g, 17 g, Oral, Daily, [place] Lighter, MD, 17 g at 08/18/17 0928 .  promethazine (PHENERGAN) injection 12.5 mg, 12.5 mg, Intravenous, Q6H PRN, Jodell Cipro, Prasanna, MD, 12.5 mg at 08/15/17 0358 .  senna-docusate (Senokot-S) tablet 2 tablet, 2 tablet, Oral, QHS, [place] Lighter, MD, 2 tablet at 08/17/17 2124 .  vitamin B-12 (CYANOCOBALAMIN) tablet 500 mcg, 500 mcg, Oral, Daily, Arta Silence, MD, 500 mcg at 08/18/17 0928 .  witch hazel-glycerin (TUCKS) pad, , Topical, PRN, [place] Lighter, MD   Physical exam: ECOG 3 Vitals:   08/18/17 0224 08/18/17 0243 08/18/17 0415 08/18/17 1341  BP:   137/82 109/75  Pulse: 83  93 90  Resp: 16  18 20   Temp:   99.4 F (37.4 C) 98.7 F  (37.1 C)  TempSrc:   Oral   SpO2: 94% 93% 94% 99%  Weight:      Height:       Physical Exam  Constitutional: She is oriented to person, place, and time. No distress.  HENT:  Head: Normocephalic and atraumatic.  Eyes: Pupils are equal, round, and reactive to light. EOM are normal. No  scleral icterus.  Neck: Normal range of motion. Neck supple.  Cardiovascular: Normal rate and normal heart sounds.  No murmur heard. Pulmonary/Chest: Effort normal. No respiratory distress.  Abdominal: Bowel sounds are normal. She exhibits distension. There is no tenderness. There is no rebound.  Musculoskeletal: Normal range of motion. She exhibits no edema.  Neurological: She is alert and oriented to person, place, and time.  Skin: Skin is warm and dry. No erythema.  Psychiatric: Mood and affect normal.       CMP Latest Ref Rng & Units 08/17/2017  Glucose 65 - 99 mg/dL 127(H)  BUN 6 - 20 mg/dL 9  Creatinine 0.44 - 1.00 mg/dL 0.55  Sodium 135 - 145 mmol/L 133(L)  Potassium 3.5 - 5.1 mmol/L 4.1  Chloride 101 - 111 mmol/L 100(L)  CO2 22 - 32 mmol/L 26  Calcium 8.9 - 10.3 mg/dL 9.1  Total Protein 6.5 - 8.1 g/dL 6.3(L)  Total Bilirubin 0.3 - 1.2 mg/dL 0.6  Alkaline Phos 38 - 126 U/L 51  AST 15 - 41 U/L 18  ALT 14 - 54 U/L 14   CBC Latest Ref Rng & Units 08/17/2017  WBC 3.6 - 11.0 K/uL 9.6  Hemoglobin 12.0 - 16.0 g/dL 11.4(L)  Hematocrit 35.0 - 47.0 % 34.2(L)  Platelets 150 - 440 K/uL 343    RADIOGRAPHIC STUDIES: I have personally reviewed the radiological images as listed and agreed with the findings in the report. Dg Chest 2 View  Result Date: 07/30/2017 CLINICAL DATA:  Shortness of breath and near syncope. History of breast cancer. EXAM: CHEST - 2 VIEW COMPARISON:  CT chest dated Jul 17, 2017. Chest x-ray dated December 31, 2012. FINDINGS: The heart size and mediastinal contours are within normal limits. Mild interstitial prominence bilaterally. Small left pleural effusion with left lower  lobe atelectasis. No pneumothorax. No acute osseous abnormality. IMPRESSION: 1. Unchanged small left pleural effusion with adjacent left lower lobe atelectasis. Electronically Signed   By: Titus Dubin M.D.   On: 07/30/2017 16:06   Dg Abdomen 1 View  Result Date: 07/30/2017 CLINICAL DATA:  77 year old female with near syncopal episode abdominal pain post chest CT today. EXAM: ABDOMEN - 1 VIEW COMPARISON:  07/30/2017 chest CT.  07/17/2017 CT abdomen and pelvis. FINDINGS: Residual contrast in renal collecting system/bladder. Gas-filled top-normal size colon splenic flexure level. Nonspecific gas collection right upper quadrant. Findings similar to scout view of recent chest CT. CT detected ascites and peritoneal carcinomatosis not adequately assessed by plain film exam. IMPRESSION: Gas-filled top-normal size colon splenic flexure level. Nonspecific gas collection right upper quadrant. Findings similar to scout view of recent chest CT. CT detected ascites and peritoneal carcinomatosis not adequately assessed by plain film exam. Electronically Signed   By: Genia Del M.D.   On: 07/30/2017 20:19   Ct Chest W Contrast  Result Date: 08/15/2017 CLINICAL DATA:  77 y/o F; history of breast cancer with metastasis to the stomach presenting with right-sided abdominal pain. EXAM: CT CHEST WITH CONTRAST TECHNIQUE: Multidetector CT imaging of the chest was performed during intravenous contrast administration. CONTRAST:  152mL ISOVUE-370 IOPAMIDOL (ISOVUE-370) INJECTION 76% COMPARISON:  07/30/2017 CT of the chest. 08/11/2017 CT abdomen and pelvis. FINDINGS: Cardiovascular: No significant vascular findings. Normal heart size. No pericardial effusion. Mediastinum/Nodes: No lymphadenopathy by imaging criteria. Thyroid gland, trachea, and esophagus demonstrate no significant findings. Lungs/Pleura: There are several stable subcentimeter pulmonary nodules at the periphery of the right upper lobe and middle lobe. Interval  development of consolidations within the  lower lobes bilaterally, hazy ground-glass opacities of the upper lobes, and interlobular septal thickening. Increased moderate bilateral pleural effusions. Upper Abdomen: Small volume of ascites. Prominent gastrohepatic lymph nodes better characterized on prior CT of abdomen and pelvis. Cholecystectomy. Musculoskeletal: No chest wall abnormality. No acute or significant osseous findings. IMPRESSION: 1. Increased moderate bilateral pleural effusions, hazy ground-glass opacities of the lungs, and smooth interlobular septal thickening. Findings probably represent pulmonary edema. 2. Dependent lower lobe consolidations probably represent atelectasis. Underlying pneumonia is possible. 3. Several stable subcentimeter pulmonary nodules at the periphery of the right upper lobe and middle lobe. Lower lobe nodules are obscured by consolidation. Consider reassessment for pulmonary metastatic disease after resolution of acute airspace disease and effusions. Electronically Signed   By: Kristine Garbe M.D.   On: 08/15/2017 03:07   Ct Angio Chest Pe W And/or Wo Contrast  Result Date: 07/30/2017 CLINICAL DATA:  PE suspected, intermediate prob, positive D-dimer. Acute onset of shortness of breath earlier today when here for a bone scan. Abdominal pain. EXAM: CT ANGIOGRAPHY CHEST WITH CONTRAST TECHNIQUE: Multidetector CT imaging of the chest was performed using the standard protocol during bolus administration of intravenous contrast. Multiplanar CT image reconstructions and MIPs were obtained to evaluate the vascular anatomy. CONTRAST:  66mL ISOVUE-370 IOPAMIDOL (ISOVUE-370) INJECTION 76% COMPARISON:  Chest and abdominal CT 07/17/2017 FINDINGS: Cardiovascular: There are no filling defects within the pulmonary arteries to suggest pulmonary embolus. Tortuosity of the thoracic aorta without dissection. Common origin of the left common carotid and brachiocephalic artery, normal  variant arch configuration. Upper normal heart size. No pericardial effusion. Mediastinum/Nodes: Increase size of right hilar lymph nodes from prior exam currently 10 and 11 mm short axis, previously 7 mm. Rounded fluid density structure adjacent to the lower esophagus likely represents loculated ascites rather than paraesophageal node. Possible 5 mm right internal mammary node versus adjacent chondral cartilage. Small anterior right epicardial nodes are unchanged. No axillary adenopathy. Post bilateral mastectomy. Lungs/Pleura: Evaluation of pulmonary nodules seen on prior exam is suboptimal due to breathing motion artifact. Majority these nodules are small measuring approximately 4 mm, and unchanged. However a right lower lobe nodule image 49 series 6 has increased in size currently 7 mm, previously 4 mm. The diaphragmatic nodule in the left lower lobe on prior exam is currently obscured. Left pleural effusion is increased, now small to moderate. There is adjacent compressive atelectasis. Tiny right pleural effusion. Minimal patchy ground-glass opacities in the right lower and left upper lobes are likely atelectasis. Upper Abdomen: Increased upper abdominal ascites since prior abdominal CT, moderate in degree. Musculoskeletal: There are no acute or suspicious osseous abnormalities. Degenerative change in the spine. Vertebral body hemangioma in T3, not hypermetabolic and bone scan earlier today. Review of the MIP images confirms the above findings. IMPRESSION: 1. No pulmonary embolus. 2. Increased left pleural effusion development of small right pleural effusion since exam 2 weeks ago. Increased upper abdominal ascites. 3. Small pulmonary nodules, majority of which are unchanged or not as well evaluated, however definite increase size of a right lower lobe pulmonary nodule over the past 2 weeks suggesting progression of metastatic disease. 4. New borderline right hilar lymph nodes measuring up to 11 mm short axis,  nonspecific in the setting for reactive versus metastatic. Electronically Signed   By: Jeb Levering M.D.   On: 07/30/2017 19:08   Mr Pelvis W Wo Contrast  Result Date: 08/16/2017 CLINICAL DATA:  History of breast carcinoma. No adjuvant chemotherapy. Patient presents with peritoneal carcinomatosis  within the abdomen on CT 08/11/2017. Evaluate for source of carcinomatosis EXAM: MRI ABDOMEN AND PELVIS WITHOUT AND WITH CONTRAST TECHNIQUE: Multiplanar multisequence MR imaging of the abdomen and pelvis was performed both before and after the administration of intravenous contrast. CONTRAST:  51mL MULTIHANCE GADOBENATE DIMEGLUMINE 529 MG/ML IV SOLN COMPARISON:  CT 08/11/2017 FINDINGS: COMBINED FINDINGS FOR BOTH MR ABDOMEN AND PELVIS Lower chest: Bilateral large pleural effusion and passive atelectasis. Hepatobiliary: No focal hepatic lesion. Postcontrast imaging is degraded by respiratory motion. Moderate volume of free fluid along the margin of the liver. Pancreas: The pancreas is grossly normal. Motion degradation. No mass lesion identified Spleen:  Normal spleen Adrenals/Urinary Tract: Adrenal glands and kidneys are normal. No obstruction. Normal bladder Stomach/Bowel: Stomach common duodenum normal. Bowel is unremarkable. MRI is not well suited to evaluate the bowel. Vascular/Lymphatic: Abdominal aorta normal caliber. No retroperitoneal periportal lymphadenopathy. Reproductive: Post hysterectomy anatomy Other: Peritoneal carcinomatosis again demonstrated. There is nodular thickening in the greater omentum. The nodularity in the pelvic sidewalls is better depicted on CT imaging. The some nodular noted on image 66/8. Musculoskeletal: No aggressive osseous lesion. IMPRESSION: 1. Peritoneal carcinomatosis better imaged on comparison CT. 2. No primary source of carcinomatosis identified. Exam is degraded by respiratory motion. 3. Moderate intraperitoneal free fluid and large bilateral pleural effusions.  Electronically Signed   By: Suzy Bouchard M.D.   On: 08/16/2017 16:08   Mr Abdomen W Wo Contrast  Result Date: 08/16/2017 CLINICAL DATA:  History of breast carcinoma. No adjuvant chemotherapy. Patient presents with peritoneal carcinomatosis within the abdomen on CT 08/11/2017. Evaluate for source of carcinomatosis EXAM: MRI ABDOMEN AND PELVIS WITHOUT AND WITH CONTRAST TECHNIQUE: Multiplanar multisequence MR imaging of the abdomen and pelvis was performed both before and after the administration of intravenous contrast. CONTRAST:  84mL MULTIHANCE GADOBENATE DIMEGLUMINE 529 MG/ML IV SOLN COMPARISON:  CT 08/11/2017 FINDINGS: COMBINED FINDINGS FOR BOTH MR ABDOMEN AND PELVIS Lower chest: Bilateral large pleural effusion and passive atelectasis. Hepatobiliary: No focal hepatic lesion. Postcontrast imaging is degraded by respiratory motion. Moderate volume of free fluid along the margin of the liver. Pancreas: The pancreas is grossly normal. Motion degradation. No mass lesion identified Spleen:  Normal spleen Adrenals/Urinary Tract: Adrenal glands and kidneys are normal. No obstruction. Normal bladder Stomach/Bowel: Stomach common duodenum normal. Bowel is unremarkable. MRI is not well suited to evaluate the bowel. Vascular/Lymphatic: Abdominal aorta normal caliber. No retroperitoneal periportal lymphadenopathy. Reproductive: Post hysterectomy anatomy Other: Peritoneal carcinomatosis again demonstrated. There is nodular thickening in the greater omentum. The nodularity in the pelvic sidewalls is better depicted on CT imaging. The some nodular noted on image 66/8. Musculoskeletal: No aggressive osseous lesion. IMPRESSION: 1. Peritoneal carcinomatosis better imaged on comparison CT. 2. No primary source of carcinomatosis identified. Exam is degraded by respiratory motion. 3. Moderate intraperitoneal free fluid and large bilateral pleural effusions. Electronically Signed   By: Suzy Bouchard M.D.   On: 08/16/2017  16:08   Nm Bone Scan Whole Body  Result Date: 07/30/2017 CLINICAL DATA:  Breast cancer.  Restaging.  Vomiting from pain. EXAM: NUCLEAR MEDICINE WHOLE BODY BONE SCAN TECHNIQUE: Whole body anterior and posterior images were obtained approximately 3 hours after intravenous injection of radiopharmaceutical. RADIOPHARMACEUTICALS:  21.4 mCi Technetium-34m MDP IV COMPARISON:  Bone scan 10/30/2016, PET-CT scan 08/30/2016 FINDINGS: No focal uptake in the axillary or appendicular skeleton to localize skeletal metastasis. Diffuse uptake in the anterior calvarium is similar comparison exam in favored physiologic or metabolic. IMPRESSION: No scintigraphic evidence skeletal metastasis. Electronically Signed  By: Suzy Bouchard M.D.   On: 07/30/2017 16:58   Ct Abdomen Pelvis W Contrast  Result Date: 08/11/2017 CLINICAL DATA:  Increase weakness and poor appetite. Vomiting for 3 days. Metastatic breast cancer. EXAM: CT ABDOMEN AND PELVIS WITH CONTRAST TECHNIQUE: Multidetector CT imaging of the abdomen and pelvis was performed using the standard protocol following bolus administration of intravenous contrast. CONTRAST:  122mL ISOVUE-300 IOPAMIDOL (ISOVUE-300) INJECTION 61% COMPARISON:  08/07/2017 FINDINGS: Lower chest: There are moderate bilateral pleural effusions, left greater than right. Increased from previous exam. Passive atelectasis within the lower lobes noted. Small pulmonary nodules are identified bilaterally compatible with metastatic disease. Hepatobiliary: There is been previous cholecystectomy. No suspicious liver abnormality identified. Pancreas: Pancreas normal. Spleen: The spleen appears normal. Adrenals/Urinary Tract: Normal adrenal glands. No kidney mass or hydronephrosis identified bilaterally. Urinary bladder is unremarkable. Stomach/Bowel: The stomach appears nondistended. The small bowel loops have a normal caliber without obstruction. No pathologic dilatation of the colon. Distal colonic  diverticulosis is identified without acute diverticulitis. Vascular/Lymphatic: Normal appearance of the abdominal aorta. Progressive adenopathy within the gastrohepatic and porta hepatis region. Index lymph node measures 1 cm, image 26/2. Previously 0.7 cm. No retroperitoneal adenopathy. No pelvic or inguinal adenopathy. Reproductive: Status post hysterectomy.  No adnexal mass. Other: There is a moderate volume of ascites within the abdomen and pelvis which has increased from 07/17/2017. Peritoneal carcinomatosis with omental caking is identified. Peritoneal lesion in the left upper quadrant of the abdomen measures 3.6 cm, image 34/2. Previously 3.5 cm the omental cake has a maximum thickness of 2.7 cm, image 58/2. Previously 2.3 cm. Musculoskeletal: Degenerative disc disease within the lower thoracic and lumbar spine. No suspicious bone lesions identified. IMPRESSION: 1. No acute findings identified within the abdomen or pelvis. 2. Peritoneal carcinomatosis with increase in volume of ascites with mild increase in size of peritoneal lesions and omental caking. 3. Bilateral pleural effusions are increased in volume from previous exam. Small pulmonary nodules within the lungs compatible with metastatic disease. Electronically Signed   By: Kerby Moors M.D.   On: 08/11/2017 18:08   US Abdomen Limited  Result Date: 08/15/2017 CLINICAL DATA:  Metastatic breast carcinoma. Abdominal carcinomatosis. Bilateral pleural effusions and ascites noted on recent CT chest. Paracentesis requested. EXAM: LIMITED ABDOMEN ULTRASOUND FOR ASCITES TECHNIQUE: Limited ultrasound survey for ascites was performed in all four abdominal quadrants. COMPARISON:  CT 08/15/2017 FINDINGS: There is a small amount of scattered ascites, predominantly in the low pelvis. No large pocket to allow safe paracentesis. IMPRESSION: 1. Small amount of scattered pelvic ascites. Findings discussed with patient. Paracentesis deferred. Electronically Signed    By: Lucrezia Europe M.D.   On: 08/15/2017 10:46   Ct Biopsy  Result Date: 08/07/2017 INDICATION: Breast cancer, peritoneal carcinomatosis, ascites, concern for metastatic disease EXAM: CT-GUIDED BIOPSY ANTERIOR OMENTAL CARCINOMATOSIS MEDICATIONS: 1% lidocaine local ANESTHESIA/SEDATION: 1.5 mg IV Versed; 50 mcg IV Fentanyl Moderate Sedation Time:  13 minutes The patient was continuously monitored during the procedure by the interventional radiology nurse under my direct supervision. PROCEDURE: The procedure, risks, benefits, and alternatives were explained to the patient. Questions regarding the procedure were encouraged and answered. The patient understands and consents to the procedure. Previous imaging reviewed. Patient positioned supine. Noncontrast localization CT performed. The lower anterior omental carcinomatosis was localized. Overlying skin marked for a anterior right lateral approach. Under sterile conditions and local anesthesia, CT guidance was utilized to advance a 17 gauge 11.8 cm lesion to the omental carcinomatosis. Needle position confirmed with CT.  18 gauge core biopsies obtained and placed in formalin. Needle removed. Postprocedure imaging demonstrates no hemorrhage or hematoma. Patient tolerated the procedure well without complication. Vital sign monitoring by nursing staff during the procedure will continue as patient is in the special procedures unit for post procedure observation. FINDINGS: The images document guide needle placement within the anterior omental mass. Post biopsy images demonstrate no hemorrhage or hematoma. COMPLICATIONS: None immediate. IMPRESSION: Successful CT-guided core biopsy of the anterior omental mass. Electronically Signed   By: Jerilynn Mages.  Shick M.D.   On: 08/07/2017 11:40   Dg Chest Port 1 View  Result Date: 08/15/2017 CLINICAL DATA:  S/p Thoracentesis- left side EXAM: PORTABLE CHEST - 1 VIEW COMPARISON:  the previous day's study FINDINGS: No pneumothorax. Small  bilateral pleural effusions, decreased on the left since prior study. Improved aeration at the left lung base. Heart size and mediastinal contours are within normal limits. Low lung volumes with prominent perihilar bronchovascular markings. Some linear scarring or subsegmental atelectasis in both lung bases. Visualized bones unremarkable.  Cholecystectomy clips. IMPRESSION: 1. No pneumothorax post left thoracentesis, with improvement in left lower lung consolidation/atelectasis. 2. Small residual bilateral pleural effusions. Electronically Signed   By: Lucrezia Europe M.D.   On: 08/15/2017 10:48   Dg Chest Portable 1 View  Result Date: 08/15/2017 CLINICAL DATA:  Breast cancer with gastric metastases. Right-sided abdominal pain. Shortness of breath. EXAM: PORTABLE CHEST 1 VIEW COMPARISON:  07/30/2017 FINDINGS: Shallow inspiration. Cardiac enlargement. Pulmonary vascularity is normal. There is increasing left and developing right pleural effusion with basilar atelectasis or consolidation. Interstitial pattern to the lungs could reflect interstitial edema or interstitial metastasis. No pneumothorax. Mediastinal contours appear intact. IMPRESSION: Cardiac enlargement. Increasing left and developing right pleural effusion with basilar atelectasis or consolidation. Interstitial pattern to the lungs may indicate interstitial edema or metastasis. Electronically Signed   By: Lucienne Capers M.D.   On: 08/15/2017 01:26   [place] Ascites (abdomen Limited)  Result Date: 07/25/2017 CLINICAL DATA:  History of breast cancer, now with peritoneal carcinomatosis. Please perform evaluate abdomen for malignant ascites and perform ultrasound-guided paracentesis as indicated. EXAM: LIMITED ABDOMEN ULTRASOUND FOR ASCITES TECHNIQUE: Limited ultrasound survey for ascites was performed in all four abdominal quadrants. COMPARISON:  CT of the chest, abdomen and pelvis-07/17/2017 FINDINGS: Sonographic evaluation performed by the dictating  interventional radiologist demonstrates only a trace amount of intra-abdominal ascites located within left upper abdominal quadrant (image 5). IMPRESSION: Trace amount of intra-abdominal ascites located within the left upper abdominal quadrant, too small to allow for safe ultrasound-guided paracentesis. Electronically Signed   By: Sandi Mariscal M.D.   On: 07/25/2017 14:06   US Thoracentesis Asp Pleural Space W/img Guide  Result Date: 08/15/2017 INDICATION: Bilateral pleural effusions.  Metastatic breast carcinoma. EXAM: ULTRASOUND GUIDED LEFT THORACENTESIS MEDICATIONS: LIDOCAINE 1% SUBCUTANEOUS COMPLICATIONS: None immediate. PROCEDURE: An ultrasound guided thoracentesis was thoroughly discussed with the patient and questions answered. The benefits, risks, alternatives and complications were also discussed. The patient understands and wishes to proceed with the procedure. Written consent was obtained. Survey ultrasound demonstrated bilateral pleural effusions, left greater than right. Ultrasound was performed to localize and mark an adequate pocket of fluid in the left chest. The area was then prepped and draped in the normal sterile fashion. 1% Lidocaine was used for local anesthesia. Under ultrasound guidance a Safe-T-Centesis catheter was introduced. Thoracentesis was performed. The catheter was removed and a dressing applied. FINDINGS: A total of approximately 500 mL of clear amber fluid was removed. Samples were  sent to the laboratory as requested by the clinical team. IMPRESSION: Successful ultrasound guided left thoracentesis yielding 500 mL of pleural fluid. Follow-up chest radiograph shows no pneumothorax. Electronically Signed   By: Lucrezia Europe M.D.   On: 08/15/2017 10:45    Assessment and plan-  Patient is a 77 y.o. female with history of stage IIIb breast cancer, status post mastectomy and axillary lymph node dissection, recently discovered peritoneal carcinomatosis presented to emergency room  complaining shortness of breath, intractable nausea vomiting, general weakness and abdominal pain.  #Peritoneal carcinomatosis, adenocarcinoma of unknown origin MRI did not provide additional information no primary was discovered.  Previously discussed on tumor board.  Positive for CDX 2, most likely GI or pancreatic origin. Original plan is to stabilize and discharged and start chemotherapy outpatient with empiric regimen carboplatin and gemcitabine.  She has an appointment tomorrow with Dr. Marcello Moores for Mediport placement. Overnight,she is requiring more oxygen Via Nasal Cannula.  Chest x-ray repeated this morning.  Chest x-ray was independently reviewed by me and discussed with patient.  Interval worsening moderate size of left pleural effusion and possible some worsening of right pleural effusion. I recomend another thoracentesis to palliate her breathing problem.  Patient is hesitant about another  procedure but will think about it. Pleurx catheter placement is a good option too.   I had another lengthy discussion with patient today.  She hesitates about her decision to go for chemotherapy.  She  worries that since she is not being discharged home, she will miss her appointment with Dr. Marlou Starks will further delay her treatment.  I explained that sometimes chemotherapy can be given through her peripheral vein.  Patient is not interested in getting chemotherapy using her peripheral vein.  She understands since she is chemotherapy nave, she may have a good chance of responding to palliative chemotherapy.  However if she continues to decline, she may not be a good candidate for chemotherapy.  Agree with palliative consult to discuss about goals of care.  Discussed with Dr.Kalisetti.  #Neoplasm related pain, with the adjust of her current pain medication, her pain is a lot better controlled.  Continue current regimen.  Total face to face encounter time for this patient visit was 35 min. >50% of the time  was  spent in counseling and coordination of care.  Earlie Server, MD, PhD Hematology Oncology [facility] at [facility] Pager- [phone] 08/18/2017

## 2017-08-18 NOTE — Plan of Care (Signed)
  Problem: Education: Goal: Knowledge of General Education information will improve Outcome: Progressing   Problem: Health Behavior/Discharge Planning: Goal: Ability to manage health-related needs will improve Outcome: Progressing   Problem: Clinical Measurements: Goal: Cardiovascular complication will be avoided Outcome: Progressing   Problem: Elimination: Goal: Will not experience complications related to urinary retention Outcome: Progressing   Problem: Safety: Goal: Ability to remain free from injury will improve Outcome: Progressing   Problem: Skin Integrity: Goal: Risk for impaired skin integrity will decrease Outcome: Progressing

## 2017-08-18 NOTE — Progress Notes (Signed)
[place] [facility] [place] NAME: Norma Johns    MR#:  [id number]  DATE OF BIRTH:  01-Dec-1940  SUBJECTIVE:  CHIEF COMPLAINT:   Chief Complaint  Patient presents with  . Abdominal Pain   -Still complains of significant constipation, worsening dyspnea.  Increased oxygen requirements up to 4 L -Improving pain.  REVIEW OF SYSTEMS:  Review of Systems  Constitutional: Positive for malaise/fatigue. Negative for chills and fever.       Loss of appetite  HENT: Negative for congestion, ear discharge, hearing loss and nosebleeds.   Eyes: Negative for blurred vision and double vision.  Respiratory: Negative for cough, shortness of breath and wheezing.   Cardiovascular: Negative for chest pain and palpitations.  Gastrointestinal: Positive for abdominal pain, constipation and nausea. Negative for diarrhea and vomiting.  Genitourinary: Negative for dysuria.  Musculoskeletal: Negative for myalgias.  Neurological: Negative for dizziness, focal weakness, seizures, weakness and headaches.  Psychiatric/Behavioral: Negative for depression.    DRUG ALLERGIES:   Allergies  Allergen Reactions  . Chocolate Shortness Of Breath  . Other Shortness Of Breath and Swelling    PT STATES ALLERGY TO "SOME TYPE OF DYE THAT WAS USED AT [facility]."  SWELLING TO ARM AND SOB.  PT CANNOT REMEMBER WHAT THE DYE WAS USED FOR.  . Sulfa Antibiotics Other (See Comments)    unknown  . Atorvastatin Other (See Comments)    Leg pain   . Minocycline Hcl Itching  . Naproxen Other (See Comments)    Indigestion  . Niacin Other (See Comments)    Felt like she was on fire  . Septra [Sulfamethoxazole-Trimethoprim] Other (See Comments)    unknown  . Vioxx [Rofecoxib] Other (See Comments)    unknown  . Penicillins Itching and Rash    Has patient had a PCN reaction causing immediate rash, facial/tongue/throat swelling, SOB or lightheadedness with hypotension: Unknown Has  patient had a PCN reaction causing severe rash involving mucus membranes or skin necrosis: Unknown Has patient had a PCN reaction that required hospitalization: No Has patient had a PCN reaction occurring within the last 10 years: No If all of the above answers are "NO", then may proceed with Cephalosporin use.     VITALS:  Blood pressure 137/82, pulse 93, temperature 99.4 F (37.4 C), temperature source Oral, resp. rate 18, height 5\' 3"  (1.6 m), weight 79.5 kg (175 lb 4.3 oz), SpO2 94 %.  PHYSICAL EXAMINATION:  Physical Exam  GENERAL:  77 y.o.-year-old patient lying in the bed, in distress due to pain. EYES: Pupils equal, round, reactive to light and accommodation. No scleral icterus. Extraocular muscles intact.  HEENT: Head atraumatic, normocephalic. Oropharynx and nasopharynx clear.  NECK:  Supple, no jugular venous distention. No thyroid enlargement, no tenderness.  LUNGS: Normal breath sounds bilaterally, no wheezing, rales,rhonchi or crepitation. No use of accessory muscles of respiration. Significantly decreased bibasilar breath sounds CARDIOVASCULAR: S1, S2 normal. No rubs, or gallops. 2/6 systolic murmur present ABDOMEN: distended, firm, tender abdomen.  Hypoactive bowel sounds present. No organomegaly or mass.  EXTREMITIES: No pedal edema, cyanosis, or clubbing.  NEUROLOGIC: Cranial nerves II through XII are intact. Muscle strength 5/5 in all extremities. Sensation intact. Gait not checked.  PSYCHIATRIC: The patient is alert and oriented x 3.  SKIN: No obvious rash, lesion, or ulcer.    LABORATORY PANEL:   CBC Recent Labs  Lab 08/17/17 0453  WBC 9.6  HGB 11.4*  HCT 34.2*  PLT 343   ------------------------------------------------------------------------------------------------------------------  Chemistries  Recent Labs  Lab 08/17/17 0453  NA 133*  K 4.1  CL 100*  CO2 26  GLUCOSE 127*  BUN 9  CREATININE 0.55  CALCIUM 9.1  AST 18  ALT 14  ALKPHOS 51    BILITOT 0.6   ------------------------------------------------------------------------------------------------------------------  Cardiac Enzymes No results for input(s): TROPONINI in the last 168 hours. ------------------------------------------------------------------------------------------------------------------  RADIOLOGY:  Mr Pelvis W Wo Contrast  Result Date: 08/16/2017 CLINICAL DATA:  History of breast carcinoma. No adjuvant chemotherapy. Patient presents with peritoneal carcinomatosis within the abdomen on CT 08/11/2017. Evaluate for source of carcinomatosis EXAM: MRI ABDOMEN AND PELVIS WITHOUT AND WITH CONTRAST TECHNIQUE: Multiplanar multisequence MR imaging of the abdomen and pelvis was performed both before and after the administration of intravenous contrast. CONTRAST:  39mL MULTIHANCE GADOBENATE DIMEGLUMINE 529 MG/ML IV SOLN COMPARISON:  CT 08/11/2017 FINDINGS: COMBINED FINDINGS FOR BOTH MR ABDOMEN AND PELVIS Lower chest: Bilateral large pleural effusion and passive atelectasis. Hepatobiliary: No focal hepatic lesion. Postcontrast imaging is degraded by respiratory motion. Moderate volume of free fluid along the margin of the liver. Pancreas: The pancreas is grossly normal. Motion degradation. No mass lesion identified Spleen:  Normal spleen Adrenals/Urinary Tract: Adrenal glands and kidneys are normal. No obstruction. Normal bladder Stomach/Bowel: Stomach common duodenum normal. Bowel is unremarkable. MRI is not well suited to evaluate the bowel. Vascular/Lymphatic: Abdominal aorta normal caliber. No retroperitoneal periportal lymphadenopathy. Reproductive: Post hysterectomy anatomy Other: Peritoneal carcinomatosis again demonstrated. There is nodular thickening in the greater omentum. The nodularity in the pelvic sidewalls is better depicted on CT imaging. The some nodular noted on image 66/8. Musculoskeletal: No aggressive osseous lesion. IMPRESSION: 1. Peritoneal carcinomatosis better  imaged on comparison CT. 2. No primary source of carcinomatosis identified. Exam is degraded by respiratory motion. 3. Moderate intraperitoneal free fluid and large bilateral pleural effusions. Electronically Signed   By: Suzy Bouchard M.D.   On: 08/16/2017 16:08   Mr Abdomen W Wo Contrast  Result Date: 08/16/2017 CLINICAL DATA:  History of breast carcinoma. No adjuvant chemotherapy. Patient presents with peritoneal carcinomatosis within the abdomen on CT 08/11/2017. Evaluate for source of carcinomatosis EXAM: MRI ABDOMEN AND PELVIS WITHOUT AND WITH CONTRAST TECHNIQUE: Multiplanar multisequence MR imaging of the abdomen and pelvis was performed both before and after the administration of intravenous contrast. CONTRAST:  73mL MULTIHANCE GADOBENATE DIMEGLUMINE 529 MG/ML IV SOLN COMPARISON:  CT 08/11/2017 FINDINGS: COMBINED FINDINGS FOR BOTH MR ABDOMEN AND PELVIS Lower chest: Bilateral large pleural effusion and passive atelectasis. Hepatobiliary: No focal hepatic lesion. Postcontrast imaging is degraded by respiratory motion. Moderate volume of free fluid along the margin of the liver. Pancreas: The pancreas is grossly normal. Motion degradation. No mass lesion identified Spleen:  Normal spleen Adrenals/Urinary Tract: Adrenal glands and kidneys are normal. No obstruction. Normal bladder Stomach/Bowel: Stomach common duodenum normal. Bowel is unremarkable. MRI is not well suited to evaluate the bowel. Vascular/Lymphatic: Abdominal aorta normal caliber. No retroperitoneal periportal lymphadenopathy. Reproductive: Post hysterectomy anatomy Other: Peritoneal carcinomatosis again demonstrated. There is nodular thickening in the greater omentum. The nodularity in the pelvic sidewalls is better depicted on CT imaging. The some nodular noted on image 66/8. Musculoskeletal: No aggressive osseous lesion. IMPRESSION: 1. Peritoneal carcinomatosis better imaged on comparison CT. 2. No primary source of carcinomatosis  identified. Exam is degraded by respiratory motion. 3. Moderate intraperitoneal free fluid and large bilateral pleural effusions. Electronically Signed   By: Suzy Bouchard M.D.   On: 08/16/2017 16:08    EKG:   Orders placed  or performed in visit on 08/15/17  . EKG 12-Lead    ASSESSMENT AND PLAN:   77 year old female with past medical history significant for metastatic breast cancer diagnosed in June 2018, status post bilateral mastectomy #2 start chemotherapy with omental carcinomatosis, GERD, depression presents to hospital secondary to worsening abdominal pain and nausea and poor appetite.  1. Abdominal pain- likely secondary to omental carcinomatosis. -Recent CT-guided omental biopsy confirming metastatic adenocarcinoma.  Unknown primary -MRI of the abdomen done and no primary identified. -Paracentesis attempted but unable to drain more fluid. -Started on fentanyl patch as outpatient recently.  Prn oxycodone for moderate pain and  Continue Dilaudid IV for severe pain -KUB ordered today for complaints of constipation.  2.  Metastatic breast cancer-diagnosed in June 2018, status post bilateral mastectomy.  Patient has refused chemo and radiation at the time. -Now with omental carcinomatosis and elevated tumor markers -Omental biopsy confirming metastatic adenocarcinoma.  Unknown primary -MRI of the abdomen and pelvis with no source identified. -Oncology consulted.  Plan to start chemotherapy next week-oncology input advised -left sided thoracentesis with 500 mL fluid removed -Consult general surgery for Port-A-Cath placement if stable, tomorrow.  3.  Dyspnea-worsening hypoxia.  Metastatic pleural effusion, status post 500 cc drained.  Repeat chest x-ray today as increased oxygen requirements  3.  Hyponatremia-received IV fluids.  Continue to monitor  5.  Poor oral intake-likely secondary to underlying cancer.  Dietitian consulted  6. GERD- protonix  7. DVT Prophylaxis-  lovenox   PT consulted-recommended home health Palliative care consulted  Overall poor prognosis   All the records are reviewed and case discussed with Care Management/Social Workerr. Management plans discussed with the patient, family and they are in agreement.  CODE STATUS: Full Code  TOTAL TIME TAKING CARE OF THIS PATIENT: 37 minutes.   POSSIBLE D/C IN 1-2 DAYS, DEPENDING ON CLINICAL CONDITION.   Gladstone Lighter M.D on 08/18/2017 at 11:36 AM  Between 7am to 6pm - Pager - [phone]  After 6pm go to [email or web address] - password [email or web address] [facility]  Office  [phone]  CC: Primary care physician; Mar Daring, PA-C

## 2017-08-18 NOTE — Care Management Note (Signed)
Case Management Note  Patient Details  Name: Norma Johns MRN: [id number] Date of Birth: 1940-10-16  Subjective/Objective:       RNCM consulted for possible HHPT needs. Patient currently lives with spouse and she is responsible for helping him and does not feel as if she can continue doing so Does have a walker available in the home but no other DME. Able to perform activities of daily living with minimal assistance. May qualify for home oxygen as she is requiring it acutely at this time. Has used [facility] in the past with poor results and given choice would like to use [facility]. Children will provide transportation. PCP is Fenton Malling but is also being established with Oncologist to begin her chemo treatments.                Action/Plan: RNCM to continue to follow for any needs. Heads up to [facility]. Primary RN to obtain qualifying sats  Expected Discharge Date:                  Expected Discharge Plan:     In-House Referral:     Discharge planning Services     Post Acute Care Choice:    Choice offered to:     DME Arranged:    DME Agency:     HH Arranged:    HH Agency:     Status of Service:     If discussed at [employer], dates discussed:    Additional Comments:  Latanya Maudlin, RN 08/18/2017, 10:47 AM

## 2017-08-19 ENCOUNTER — Inpatient Hospital Stay: Payer: Medicare Other

## 2017-08-19 ENCOUNTER — Telehealth: Payer: Self-pay | Admitting: *Deleted

## 2017-08-19 ENCOUNTER — Ambulatory Visit: Payer: Self-pay | Admitting: General Surgery

## 2017-08-19 DIAGNOSIS — C801 Malignant (primary) neoplasm, unspecified: Secondary | ICD-10-CM

## 2017-08-19 DIAGNOSIS — R140 Abdominal distension (gaseous): Secondary | ICD-10-CM

## 2017-08-19 DIAGNOSIS — J9600 Acute respiratory failure, unspecified whether with hypoxia or hypercapnia: Secondary | ICD-10-CM

## 2017-08-19 DIAGNOSIS — Z853 Personal history of malignant neoplasm of breast: Secondary | ICD-10-CM

## 2017-08-19 DIAGNOSIS — K5900 Constipation, unspecified: Secondary | ICD-10-CM

## 2017-08-19 DIAGNOSIS — J910 Malignant pleural effusion: Secondary | ICD-10-CM

## 2017-08-19 LAB — BASIC METABOLIC PANEL
Anion gap: 9 (ref 5–15)
BUN: 7 mg/dL (ref 6–20)
CALCIUM: 9.1 mg/dL (ref 8.9–10.3)
CO2: 25 mmol/L (ref 22–32)
CREATININE: 0.4 mg/dL — AB (ref 0.44–1.00)
Chloride: 99 mmol/L — ABNORMAL LOW (ref 101–111)
GFR calc Af Amer: >60 mL/min (ref >60)
GFR calc non Af Amer: >60 mL/min (ref >60)
Glucose, Bld: 145 mg/dL — ABNORMAL HIGH (ref 65–99)
Potassium: 4 mmol/L (ref 3.5–5.1)
SODIUM: 133 mmol/L — AB (ref 135–145)

## 2017-08-19 LAB — URINALYSIS, ROUTINE W REFLEX MICROSCOPIC
BILIRUBIN URINE: NEGATIVE
Glucose, UA: NEGATIVE mg/dL
HGB URINE DIPSTICK: NEGATIVE
Ketones, ur: NEGATIVE mg/dL
NITRITE: NEGATIVE
PH: 9 — AB (ref 5.0–8.0)
Protein, ur: NEGATIVE mg/dL
SPECIFIC GRAVITY, URINE: 1.006 (ref 1.005–1.030)

## 2017-08-19 MED ORDER — LIDOCAINE VISCOUS HCL 2 % MT SOLN
15.0000 mL | OROMUCOSAL | Status: DC | PRN
  Administered 2017-08-20: 15 mL via OROMUCOSAL
  Filled 2017-08-19 (×3): qty 15

## 2017-08-19 MED ORDER — ENSURE ENLIVE PO LIQD: 237.0000 mL | Freq: Two times a day (BID) | ORAL | 12 refills | Status: AC

## 2017-08-19 MED ORDER — FENTANYL 12 MCG/HR TD PT72: 37.5000 ug | MEDICATED_PATCH | TRANSDERMAL | 0 refills | Status: DC

## 2017-08-19 MED ORDER — CIPROFLOXACIN IN D5W 400 MG/200ML IV SOLN
400.0000 mg | Freq: Two times a day (BID) | INTRAVENOUS | Status: DC
  Filled 2017-08-19 (×2): qty 200

## 2017-08-19 MED ORDER — OXYCODONE HCL 10 MG PO TABS: 10.0000 mg | ORAL_TABLET | ORAL | 0 refills | Status: DC | PRN

## 2017-08-19 MED ORDER — POLYETHYLENE GLYCOL 3350 17 G PO PACK: 17.0000 g | PACK | Freq: Every day | ORAL | 0 refills | Status: AC

## 2017-08-19 MED ORDER — CIPROFLOXACIN HCL 500 MG PO TABS: 500.0000 mg | ORAL_TABLET | Freq: Two times a day (BID) | ORAL | 0 refills | Status: AC

## 2017-08-19 NOTE — Procedures (Signed)
malinangt pleural eff  S/p left thora  675cc removed No comp Stable cxr pending Full report in pacs

## 2017-08-19 NOTE — Progress Notes (Signed)
New referral for outpatient Palliative to follow at home received from [facility]. Plan is for discharge home today with [facility]. Flo Shanks RN, BSN, [place] [facility] [place], hospital Liaison [phone]

## 2017-08-19 NOTE — Discharge Summary (Addendum)
[place] [facility] [place] NAME: Norma Johns    MR#:  [id number]  DATE OF BIRTH:  11-13-1940  DATE OF ADMISSION:  08/15/2017 ADMITTING PHYSICIAN: Arta Silence, MD  DATE OF DISCHARGE: 08/20/2017  PRIMARY CARE PHYSICIAN: Mar Daring, PA-C    ADMISSION DIAGNOSIS:  Right side Pain  DISCHARGE DIAGNOSIS:  Active Problems:   Acute hypoxemic respiratory failure (HCC)   Pleural effusion   Abdominal carcinomatosis (HCC)   Malignant ascites   Status post thoracentesis   Carcinoma of unknown primary ([place])   SECONDARY DIAGNOSIS:    HOSPITAL COURSE:   77 year old female with history of metastatic breast cancer status post bilateral mastectomy and depression who presented to the emergency room due to abdominal pain and nausea.  1.  Abdominal pain: This is due to omental carcinomatosis.  Patient was evaluated by oncology while in the hospital.  Recent CT-guided omental biopsy confirmed metastatic adenocarcinoma.  MRI showed with no primary source. Patient will be discharged with hospice due to overall poor prognosis.  She is on fentanyl and oxycodone.  2.  Metastatic breast cancer diagnosed June 2018 status post bilateral mastectomy: Patient will follow-up with oncology. Patient is status post left-sided thoracentesis  3.  Acute on chronic hypoxic respiratory failure in the setting of recurrent pleural effusion with shortness of breath/dyspnea due to metastatic pleural effusion status post thoracentesis  4.  Hyponatremia: This is stable  Patient will be discharged home with palliative care   DISCHARGE CONDITIONS AND DIET:  Guarded Regular diet  CONSULTS OBTAINED:  Treatment Team:  Arta Silence, MD Earlie Server, MD Bettey Costa, MD  DRUG ALLERGIES:   Allergies  Allergen Reactions  . Chocolate Shortness Of Breath  . Other Shortness Of Breath and Swelling    PT STATES ALLERGY TO "SOME TYPE OF DYE THAT WAS USED AT [facility]."  SWELLING TO ARM AND SOB.  PT CANNOT REMEMBER WHAT THE DYE WAS USED FOR.  . Sulfa Antibiotics Other (See Comments)    unknown  . Atorvastatin Other (See Comments)    Leg pain   . Minocycline Hcl Itching  . Naproxen Other (See Comments)    Indigestion  . Niacin Other (See Comments)    Felt like she was on fire  . Septra [Sulfamethoxazole-Trimethoprim] Other (See Comments)    unknown  . Vioxx [Rofecoxib] Other (See Comments)    unknown  . Penicillins Itching and Rash    Has patient had a PCN reaction causing immediate rash, facial/tongue/throat swelling, SOB or lightheadedness with hypotension: Unknown Has patient had a PCN reaction causing severe rash involving mucus membranes or skin necrosis: Unknown Has patient had a PCN reaction that required hospitalization: No Has patient had a PCN reaction occurring within the last 10 years: No If all of the above answers are "NO", then may proceed with Cephalosporin use.     DISCHARGE MEDICATIONS:   Allergies as of 08/20/2017      Reactions   Chocolate Shortness Of Breath   Other Shortness Of Breath, Swelling   PT STATES ALLERGY TO "SOME TYPE OF DYE THAT WAS USED AT [facility]."  SWELLING TO ARM AND SOB.  PT CANNOT REMEMBER WHAT THE DYE WAS USED FOR.   Sulfa Antibiotics Other (See Comments)   unknown   Atorvastatin Other (See Comments)   Leg pain    Minocycline Hcl Itching   Naproxen Other (See Comments)   Indigestion   Niacin Other (See Comments)   Felt  like she was on fire   Septra [sulfamethoxazole-trimethoprim] Other (See Comments)   unknown   Vioxx [rofecoxib] Other (See Comments)   unknown   Penicillins Itching, Rash   Has patient had a PCN reaction causing immediate rash, facial/tongue/throat swelling, SOB or lightheadedness with hypotension: Unknown Has patient had a PCN reaction causing severe rash involving mucus membranes or skin necrosis: Unknown Has patient had a PCN reaction that required  hospitalization: No Has patient had a PCN reaction occurring within the last 10 years: No If all of the above answers are "NO", then may proceed with Cephalosporin use.      Medication List    STOP taking these medications   fentaNYL 25 MCG/HR patch Commonly known as:  DURAGESIC - dosed mcg/hr Replaced by:  fentaNYL 12 MCG/HR   oxyCODONE ER 9 MG C12a Commonly known as:  XTAMPZA ER   prochlorperazine 10 MG tablet Commonly known as:  COMPAZINE   triamcinolone cream 0.1 % Commonly known as:  KENALOG     TAKE these medications   albuterol 108 (90 Base) MCG/ACT inhaler Commonly known as:  PROVENTIL HFA;VENTOLIN HFA Inhale 2 puffs into the lungs every 4 (four) hours as needed for wheezing or shortness of breath.   ALPRAZolam 0.5 MG tablet Commonly known as:  XANAX Take 1 tablet (0.5 mg total) by mouth at bedtime as needed for anxiety.   ciprofloxacin 500 MG tablet Commonly known as:  CIPRO Take 1 tablet (500 mg total) by mouth 2 (two) times daily for 4 days.   feeding supplement (ENSURE ENLIVE) Liqd Take 237 mLs by mouth 2 (two) times daily between meals.   fentaNYL 12 MCG/HR Commonly known as:  DURAGESIC - dosed mcg/hr Place 3 patches (37.5 mcg total) onto the skin every 3 (three) days. Replaces:  fentaNYL 25 MCG/HR patch   fexofenadine 180 MG tablet Commonly known as:  ALLEGRA Take 180 mg by mouth daily. PRN   fluticasone 50 MCG/ACT nasal spray Commonly known as:  FLONASE Place 2 sprays into both nostrils daily. What changed:    when to take this  reasons to take this   multivitamin capsule Take 1 capsule by mouth daily.   ondansetron 4 MG disintegrating tablet Commonly known as:  ZOFRAN ODT Take 1 tablet (4 mg total) by mouth every 8 (eight) hours as needed for nausea or vomiting.   Oxycodone HCl 10 MG Tabs Take 1 tablet (10 mg total) by mouth every 4 (four) hours as needed for moderate pain. What changed:    medication strength  how much to  take  reasons to take this   pantoprazole 40 MG tablet Commonly known as:  PROTONIX Take 1 tablet (40 mg total) by mouth 2 (two) times daily before a meal.   polyethylene glycol packet Commonly known as:  MIRALAX / GLYCOLAX Take 17 g by mouth daily.   senna-docusate 8.6-50 MG tablet Commonly known as:  SENNA S Take 2 tablets by mouth daily.   vitamin B-12 500 MCG tablet Commonly known as:  CYANOCOBALAMIN Take 500 mcg by mouth daily.            Durable Medical Equipment  (From admission, onward)        Start     Ordered   08/20/17 1043  For home use only DME Hospital bed  Once    Question Answer Comment  The above medical condition requires: Patient requires the ability to reposition frequently   Head must be elevated greater than: 45  degrees   Bed type Semi-electric   Support Surface: Gel Overlay      08/20/17 1043   08/19/17 0954  DME Oxygen  Once    Question Answer Comment  Mode or (Route) Nasal cannula   Liters per Minute 3   Frequency Continuous (stationary and portable oxygen unit needed)   Oxygen delivery system Gas      08/19/17 0953        Today   CHIEF COMPLAINT:   No acute events overnight   VITAL SIGNS:  Blood pressure 131/69, pulse (!) 56, temperature 99.4 F (37.4 C), temperature source Oral, resp. rate 17, height 5\' 3"  (1.6 m), weight 79.5 kg (175 lb 4.3 oz), SpO2 96 %.   REVIEW OF SYSTEMS:  Review of Systems  Constitutional: Positive for malaise/fatigue. Negative for chills and fever.  HENT: Negative.  Negative for ear discharge, ear pain, hearing loss, nosebleeds and sore throat.   Eyes: Negative.  Negative for blurred vision and pain.  Respiratory: Positive for cough and shortness of breath. Negative for hemoptysis and wheezing.   Cardiovascular: Negative.  Negative for chest pain, palpitations and leg swelling.  Gastrointestinal: Negative.  Negative for abdominal pain, blood in stool, diarrhea, nausea and vomiting.   Genitourinary: Negative.  Negative for dysuria.  Musculoskeletal: Negative.  Negative for back pain.  Skin: Negative.   Neurological: Negative for dizziness, tremors, speech change, focal weakness, seizures and headaches.  Endo/Heme/Allergies: Negative.  Does not bruise/bleed easily.  Psychiatric/Behavioral: Negative.  Negative for depression, hallucinations and suicidal ideas.     PHYSICAL EXAMINATION:  GENERAL:  77 y.o.-year-old patient lying in the bed with no acute distress.  NECK:  Supple, no jugular venous distention. No thyroid enlargement, no tenderness.  LUNGS: Decreased breath sounds right base and left side CARDIOVASCULAR: S1, S2 normal. No murmurs, rubs, or gallops.  ABDOMEN: Soft, non-tender, non-distended. Bowel sounds present. No organomegaly or mass.  EXTREMITIES: No pedal edema, cyanosis, or clubbing.  PSYCHIATRIC: The patient is alert and oriented x 3.  SKIN: No obvious rash, lesion, or ulcer.   DATA REVIEW:   CBC Recent Labs  Lab 08/17/17 0453  WBC 9.6  HGB 11.4*  HCT 34.2*  PLT 343    Chemistries  Recent Labs  Lab 08/17/17 0453 08/19/17 1048  NA 133* 133*  K 4.1 4.0  CL 100* 99*  CO2 26 25  GLUCOSE 127* 145*  BUN 9 7  CREATININE 0.55 0.40*  CALCIUM 9.1 9.1  AST 18  --   ALT 14  --   ALKPHOS 51  --   BILITOT 0.6  --     Cardiac Enzymes No results for input(s): TROPONINI in the last 168 hours.  Microbiology Results  @[email or web address] @  RADIOLOGY:  Dg Chest 1 View  Result Date: 08/19/2017 CLINICAL DATA:  Post left thoracentesis EXAM: CHEST  1 VIEW COMPARISON:  08/18/2017 chest radiograph. FINDINGS: Low lung volumes. Stable cardiomediastinal silhouette with top-normal heart size. No pneumothorax. Stable small right pleural effusion. Trace residual left pleural effusion, decreased. No overt pulmonary edema. Improved aeration at the left lung base with mild residual patchy bibasilar lung opacities. IMPRESSION: 1. No pneumothorax. 2. Trace residual  left pleural effusion, decreased. Stable small right pleural effusion. 3. Improved aeration at the left lung base, with residual mild patchy bibasilar lung opacities. Electronically Signed   By: Ilona Sorrel M.D.   On: 08/19/2017 15:59   Dg Chest 2 View  Result Date: 08/18/2017 CLINICAL DATA:  Increasing shortness of breath.  Thoracentesis 08/15/2017. Metastatic breast cancer. EXAM: CHEST - 2 VIEW COMPARISON:  08/15/2017 FINDINGS: Lungs are hypoinflated demonstrate mild interval worsening of a moderate size left pleural effusion likely with associated basilar atelectasis. Possible worsening of small to moderate right pleural effusion with associated basilar atelectasis. Mild prominence of the perihilar markings suggesting mild interstitial edema. Cardiomediastinal silhouette and remainder of the exam is unchanged. IMPRESSION: Interval worsening moderate size left pleural effusion and possible mild worsening of small to moderate right pleural effusion likely with associated basilar atelectasis. Mild interstitial edema. Electronically Signed   By: Marin Olp M.D.   On: 08/18/2017 16:02   Dg Abd 1 View  Result Date: 08/18/2017 CLINICAL DATA:  Abdominal distention and known metastatic breast cancer. EXAM: ABDOMEN - 1 VIEW COMPARISON:  07/30/2017 FINDINGS: Bowel gas pattern is nonobstructive. No evidence of free peritoneal air. Surgical clips over the right upper quadrant. Mild degenerate change of the spine. Several pelvic phleboliths. IMPRESSION: Nonobstructive bowel gas pattern. Electronically Signed   By: Marin Olp M.D.   On: 08/18/2017 16:00   US Thoracentesis Asp Pleural Space W/img Guide  Result Date: 08/19/2017 INDICATION: Breast cancer, pleural effusions, shortness of breath EXAM: ULTRASOUND GUIDED LEFT THORACENTESIS MEDICATIONS: 1% lidocaine local COMPLICATIONS: None immediate. PROCEDURE: An ultrasound guided thoracentesis was thoroughly discussed with the patient and questions answered. The  benefits, risks, alternatives and complications were also discussed. The patient understands and wishes to proceed with the procedure. Written consent was obtained. Ultrasound was performed to localize and mark an adequate pocket of fluid in the left chest. The area was then prepped and draped in the normal sterile fashion. 1% Lidocaine was used for local anesthesia. Under ultrasound guidance a 6 Fr Safe-T-Centesis catheter was introduced. Thoracentesis was performed. The catheter was removed and a dressing applied. FINDINGS: A total of approximately 675 cc of blood-tinged pleural fluid was removed. Sample was not sent for laboratory analysis IMPRESSION: Successful ultrasound guided left thoracentesis yielding 675 cc of pleural fluid. Electronically Signed   By: Jerilynn Mages.  Shick M.D.   On: 08/19/2017 15:28      Allergies as of 08/20/2017      Reactions   Chocolate Shortness Of Breath   Other Shortness Of Breath, Swelling   PT STATES ALLERGY TO "SOME TYPE OF DYE THAT WAS USED AT [facility]."  SWELLING TO ARM AND SOB.  PT CANNOT REMEMBER WHAT THE DYE WAS USED FOR.   Sulfa Antibiotics Other (See Comments)   unknown   Atorvastatin Other (See Comments)   Leg pain    Minocycline Hcl Itching   Naproxen Other (See Comments)   Indigestion   Niacin Other (See Comments)   Felt like she was on fire   Septra [sulfamethoxazole-trimethoprim] Other (See Comments)   unknown   Vioxx [rofecoxib] Other (See Comments)   unknown   Penicillins Itching, Rash   Has patient had a PCN reaction causing immediate rash, facial/tongue/throat swelling, SOB or lightheadedness with hypotension: Unknown Has patient had a PCN reaction causing severe rash involving mucus membranes or skin necrosis: Unknown Has patient had a PCN reaction that required hospitalization: No Has patient had a PCN reaction occurring within the last 10 years: No If all of the above answers are "NO", then may proceed with Cephalosporin use.       Medication List    STOP taking these medications   fentaNYL 25 MCG/HR patch Commonly known as:  DURAGESIC - dosed mcg/hr Replaced by:  fentaNYL 12 MCG/HR   oxyCODONE ER 9  MG C12a Commonly known as:  XTAMPZA ER   prochlorperazine 10 MG tablet Commonly known as:  COMPAZINE   triamcinolone cream 0.1 % Commonly known as:  KENALOG     TAKE these medications   albuterol 108 (90 Base) MCG/ACT inhaler Commonly known as:  PROVENTIL HFA;VENTOLIN HFA Inhale 2 puffs into the lungs every 4 (four) hours as needed for wheezing or shortness of breath.   ALPRAZolam 0.5 MG tablet Commonly known as:  XANAX Take 1 tablet (0.5 mg total) by mouth at bedtime as needed for anxiety.   ciprofloxacin 500 MG tablet Commonly known as:  CIPRO Take 1 tablet (500 mg total) by mouth 2 (two) times daily for 4 days.   feeding supplement (ENSURE ENLIVE) Liqd Take 237 mLs by mouth 2 (two) times daily between meals.   fentaNYL 12 MCG/HR Commonly known as:  DURAGESIC - dosed mcg/hr Place 3 patches (37.5 mcg total) onto the skin every 3 (three) days. Replaces:  fentaNYL 25 MCG/HR patch   fexofenadine 180 MG tablet Commonly known as:  ALLEGRA Take 180 mg by mouth daily. PRN   fluticasone 50 MCG/ACT nasal spray Commonly known as:  FLONASE Place 2 sprays into both nostrils daily. What changed:    when to take this  reasons to take this   multivitamin capsule Take 1 capsule by mouth daily.   ondansetron 4 MG disintegrating tablet Commonly known as:  ZOFRAN ODT Take 1 tablet (4 mg total) by mouth every 8 (eight) hours as needed for nausea or vomiting.   Oxycodone HCl 10 MG Tabs Take 1 tablet (10 mg total) by mouth every 4 (four) hours as needed for moderate pain. What changed:    medication strength  how much to take  reasons to take this   pantoprazole 40 MG tablet Commonly known as:  PROTONIX Take 1 tablet (40 mg total) by mouth 2 (two) times daily before a meal.   polyethylene glycol  packet Commonly known as:  MIRALAX / GLYCOLAX Take 17 g by mouth daily.   senna-docusate 8.6-50 MG tablet Commonly known as:  SENNA S Take 2 tablets by mouth daily.   vitamin B-12 500 MCG tablet Commonly known as:  CYANOCOBALAMIN Take 500 mcg by mouth daily.            Durable Medical Equipment  (From admission, onward)        Start     Ordered   08/20/17 1043  For home use only DME Hospital bed  Once    Question Answer Comment  The above medical condition requires: Patient requires the ability to reposition frequently   Head must be elevated greater than: 45 degrees   Bed type Semi-electric   Support Surface: Gel Overlay      08/20/17 1043   08/19/17 0954  DME Oxygen  Once    Question Answer Comment  Mode or (Route) Nasal cannula   Liters per Minute 3   Frequency Continuous (stationary and portable oxygen unit needed)   Oxygen delivery system Gas      08/19/17 0953         Management plans discussed with the patient and she is in agreement. Stable for discharge home with palliative care Patient should follow up with palliative care  CODE STATUS:     Code Status Orders  (From admission, onward)        Start     Ordered   08/15/17 0304  Full code  Continuous  08/15/17 0303    Code Status History    Date Active Date Inactive Code Status Order ID Comments User Context   10/01/2016 1539 10/02/2016 1404 Full Code [id number]  Robert Bellow, MD Inpatient      TOTAL TIME TAKING CARE OF THIS PATIENT: 38 minutes.    Note: This dictation was prepared with Dragon dictation along with smaller phrase technology. Any transcriptional errors that result from this process are unintentional.  Jillane Po M.D on 08/20/2017 at 10:44 AM  Between 7am to 6pm - Pager - [phone] After 6pm go to [email or web address] - password [email or web address] [facility]  Office  [phone]  CC: Primary care physician; Mar Daring, PA-C

## 2017-08-19 NOTE — Telephone Encounter (Signed)
  Patient of Dr Tasia Catchings.  OK to sign for palliative care.  Has she decided no more chemotherapy?  M

## 2017-08-19 NOTE — Progress Notes (Signed)
Family Meeting Note  Advance Directive:no  Today a meeting took place with the Patient.and son  The following clinical team members were present during this meeting:MD  The following were discussed:Patient's diagnosis carcinomatosis History of bilateral lateral mastectomy with breast cancer : , Patient's progosis: < 6 months and Goals for treatment: DNR  Additional follow-up to be provided: DNR order written  Time spent during discussion: 16 minutes  Brady Plant, MD

## 2017-08-19 NOTE — Telephone Encounter (Signed)
Patient discharging form hospital and a palliative care referral ha been made, Asking if we will sign orders for Palliative Care

## 2017-08-19 NOTE — Care Management (Addendum)
RNCM spoke with patient and family regarding hospice versus palliative care services as patient is to be discharged today. Patient still uncertain about the pursuit of chemotherapy. After deliberation with family patient has opted to go with outpatient palliative services as well as Home Health with PT, nursing and nursing aide services as opposed to hospice with the understanding at any point she may transition to hospice care. RNCM provided choice previously and family would like to proceed with advanced home care. She also qualifies for chronic oxygen. Referral placed with Corene Cornea from [facility] for services and DME needs. [place] liaison of A/C notified of consult. Patient will be discharged home and family to transport.  Ines Bloomer RN BSN RNCM [phone]

## 2017-08-19 NOTE — Progress Notes (Signed)
Norma Johns   DOB:02/08/1941   LK#:[id number]    Subjective: Shortness of breath improved since thoracentesis.  Continues to complain of abdominal distention abdominal discomfort.  Positive for constipation but no diarrhea.  No fever chills.  No  Objective:  Vitals:   08/19/17 2009 08/19/17 2027  BP: 120/72   Pulse: 87   Resp: 16   Temp: 98.6 F (37 C)   SpO2: 95% 94%     Intake/Output Summary (Last 24 hours) at 08/19/2017 2121 Last data filed at 08/19/2017 0932 Gross per 24 hour  Intake 120 ml  Output -  Net 120 ml    GENERAL Alert, no distress and comfortable.  Accompanied by daughter. EYES: no pallor or icterus OROPHARYNX: no thrush or ulceration. NECK: supple, no masses felt LYMPH:  no palpable lymphadenopathy in the cervical, axillary or inguinal regions LUNGS: decreased breath sounds to auscultation at bases and  No wheeze or crackles HEART/CVS: regular rate & rhythm and no murmurs; No lower extremity edema ABDOMEN: abdomen soft, tender  on deep palpation. and normal bowel sounds; positive for abdominal distention. Musculoskeletal:no cyanosis of digits and no clubbing  PSYCH: alert & oriented x 3 with fluent speech NEURO: no focal motor/sensory deficits SKIN:  no rashes or significant lesions   Labs:  Lab Results  Component Value Date   WBC 9.6 08/17/2017   HGB 11.4 (L) 08/17/2017   HCT 34.2 (L) 08/17/2017   MCV 89.5 08/17/2017   PLT 343 08/17/2017   NEUTROABS 8.0 (H) 08/14/2017    Lab Results  Component Value Date   NA 133 (L) 08/19/2017   K 4.0 08/19/2017   CL 99 (L) 08/19/2017   CO2 25 08/19/2017    Studies:  Dg Chest 1 View  Result Date: 08/19/2017 CLINICAL DATA:  Post left thoracentesis EXAM: CHEST  1 VIEW COMPARISON:  08/18/2017 chest radiograph. FINDINGS: Low lung volumes. Stable cardiomediastinal silhouette with top-normal heart size. No pneumothorax. Stable small right pleural effusion. Trace residual left pleural effusion, decreased. No overt  pulmonary edema. Improved aeration at the left lung base with mild residual patchy bibasilar lung opacities. IMPRESSION: 1. No pneumothorax. 2. Trace residual left pleural effusion, decreased. Stable small right pleural effusion. 3. Improved aeration at the left lung base, with residual mild patchy bibasilar lung opacities. Electronically Signed   By: Ilona Sorrel M.D.   On: 08/19/2017 15:59   Dg Chest 2 View  Result Date: 08/18/2017 CLINICAL DATA:  Increasing shortness of breath. Thoracentesis 08/15/2017. Metastatic breast cancer. EXAM: CHEST - 2 VIEW COMPARISON:  08/15/2017 FINDINGS: Lungs are hypoinflated demonstrate mild interval worsening of a moderate size left pleural effusion likely with associated basilar atelectasis. Possible worsening of small to moderate right pleural effusion with associated basilar atelectasis. Mild prominence of the perihilar markings suggesting mild interstitial edema. Cardiomediastinal silhouette and remainder of the exam is unchanged. IMPRESSION: Interval worsening moderate size left pleural effusion and possible mild worsening of small to moderate right pleural effusion likely with associated basilar atelectasis. Mild interstitial edema. Electronically Signed   By: Marin Olp M.D.   On: 08/18/2017 16:02   Dg Abd 1 View  Result Date: 08/18/2017 CLINICAL DATA:  Abdominal distention and known metastatic breast cancer. EXAM: ABDOMEN - 1 VIEW COMPARISON:  07/30/2017 FINDINGS: Bowel gas pattern is nonobstructive. No evidence of free peritoneal air. Surgical clips over the right upper quadrant. Mild degenerate change of the spine. Several pelvic phleboliths. IMPRESSION: Nonobstructive bowel gas pattern. Electronically Signed   By: Quillian Quince  Derrel Nip M.D.   On: 08/18/2017 16:00   US Thoracentesis Asp Pleural Space W/img Guide  Result Date: 08/19/2017 INDICATION: Breast cancer, pleural effusions, shortness of breath EXAM: ULTRASOUND GUIDED LEFT THORACENTESIS MEDICATIONS: 1%  lidocaine local COMPLICATIONS: None immediate. PROCEDURE: An ultrasound guided thoracentesis was thoroughly discussed with the patient and questions answered. The benefits, risks, alternatives and complications were also discussed. The patient understands and wishes to proceed with the procedure. Written consent was obtained. Ultrasound was performed to localize and mark an adequate pocket of fluid in the left chest. The area was then prepped and draped in the normal sterile fashion. 1% Lidocaine was used for local anesthesia. Under ultrasound guidance a 6 Fr Safe-T-Centesis catheter was introduced. Thoracentesis was performed. The catheter was removed and a dressing applied. FINDINGS: A total of approximately 675 cc of blood-tinged pleural fluid was removed. Sample was not sent for laboratory analysis IMPRESSION: Successful ultrasound guided left thoracentesis yielding 675 cc of pleural fluid. Electronically Signed   By: Jerilynn Mages.  Shick M.D.   On: 08/19/2017 15:28    Assessment & Plan:   #77 year old female patient with prior history of breast cancer currently with carcinoma unknown primary-with peritoneal carcinomatosis/pleural effusion  #Carcinoma of unknown primary-peritoneal carcinomatosis/pleural effusions-adenocarcinoma CDX 2+ [questions GI/pancreatobiliary]-patient had multiple discussions with primary oncologist regarding chemotherapy.  On further discussion today she makes it very clear to me that she is not interested in any chemotherapy at this time; which I think is a reasonable option given her choice/likely poor response to chemotherapy/and poor performance status.  Patient is high risk for complications/and death from chemotherapy.  #Respiratory failure-secondary to malignant pleural effusion-will need oxygen outpatient.  #DNR/DNI; disposition patient awaiting discharge home with home health/palliative care referral.  Patient will likely need hospice fairly soon at home.  The above plan of care  was discussed with the patient and daughter in detail.  I think is reasonable to cancel the appointment at the cancer center on the 26th-as she is not interested in further chemotherapy. They agree.   # 40 minutes face-to-face with the patient discussing the above plan of care; more than 50% of time spent on prognosis/ natural history; counseling and coordination.    Cammie Sickle, MD 08/19/2017  9:21 PM

## 2017-08-19 NOTE — Care Management Important Message (Signed)
Important Message  Patient Details  Name: Norma Johns MRN: [id number] Date of Birth: 11-26-1940   Medicare Important Message Given:  Yes    [name] 08/19/2017, 10:23 AM

## 2017-08-19 NOTE — Discharge Summary (Deleted)
[place] [facility] [place] NAME: Norma Johns    MR#:  [id number]  DATE OF BIRTH:  [birth year]  DATE OF ADMISSION:  08/15/2017 ADMITTING PHYSICIAN: Arta Silence, MD  DATE OF DISCHARGE: 08/19/2017  PRIMARY CARE PHYSICIAN: Mar Daring, PA-C    ADMISSION DIAGNOSIS:  Right side Pain  DISCHARGE DIAGNOSIS:  Active Problems:   Acute hypoxemic respiratory failure (HCC)   Pleural effusion   Abdominal carcinomatosis (HCC)   Malignant ascites   Status post thoracentesis   Carcinoma of unknown primary ([place])   SECONDARY DIAGNOSIS:    HOSPITAL COURSE:   77 year old female with history of metastatic breast cancer status post bilateral mastectomy and depression who presented to the emergency room due to abdominal pain and nausea.  1.  Abdominal pain: This is due to omental carcinomatosis.  Patient was evaluated by oncology while in the hospital.  Recent CT-guided omental biopsy confirmed metastatic adenocarcinoma.  MRI showed with no primary source. Patient will be discharged with hospice due to overall poor prognosis.  She is on fentanyl and oxycodone.  2.  Metastatic breast cancer diagnosed June 2018 status post bilateral mastectomy: Patient will follow-up with oncology. Patient is status post left-sided thoracentesis  3.  Acute hypoxic respiratory failure in the setting of recurrent pleural effusion with shortness of breath/dyspnea due to metastatic pleural effusion status post thoracentesis  4.  Hyponatremia: This is stable  Patient will be discharged home with hospice services   DISCHARGE CONDITIONS AND DIET:  Guarded Regular diet  CONSULTS OBTAINED:  Treatment Team:  Arta Silence, MD Earlie Server, MD Bettey Costa, MD  DRUG ALLERGIES:   Allergies  Allergen Reactions  . Chocolate Shortness Of Breath  . Other Shortness Of Breath and Swelling    PT STATES ALLERGY TO "SOME TYPE OF DYE THAT WAS USED AT [facility]."  SWELLING TO ARM AND SOB.  PT CANNOT REMEMBER WHAT THE DYE WAS USED FOR.  . Sulfa Antibiotics Other (See Comments)    unknown  . Atorvastatin Other (See Comments)    Leg pain   . Minocycline Hcl Itching  . Naproxen Other (See Comments)    Indigestion  . Niacin Other (See Comments)    Felt like she was on fire  . Septra [Sulfamethoxazole-Trimethoprim] Other (See Comments)    unknown  . Vioxx [Rofecoxib] Other (See Comments)    unknown  . Penicillins Itching and Rash    Has patient had a PCN reaction causing immediate rash, facial/tongue/throat swelling, SOB or lightheadedness with hypotension: Unknown Has patient had a PCN reaction causing severe rash involving mucus membranes or skin necrosis: Unknown Has patient had a PCN reaction that required hospitalization: No Has patient had a PCN reaction occurring within the last 10 years: No If all of the above answers are "NO", then may proceed with Cephalosporin use.     DISCHARGE MEDICATIONS:   Allergies as of 08/19/2017      Reactions   Chocolate Shortness Of Breath   Other Shortness Of Breath, Swelling   PT STATES ALLERGY TO "SOME TYPE OF DYE THAT WAS USED AT [facility]."  SWELLING TO ARM AND SOB.  PT CANNOT REMEMBER WHAT THE DYE WAS USED FOR.   Sulfa Antibiotics Other (See Comments)   unknown   Atorvastatin Other (See Comments)   Leg pain    Minocycline Hcl Itching   Naproxen Other (See Comments)   Indigestion   Niacin Other (See Comments)   Felt like she  was on fire   Septra [sulfamethoxazole-trimethoprim] Other (See Comments)   unknown   Vioxx [rofecoxib] Other (See Comments)   unknown   Penicillins Itching, Rash   Has patient had a PCN reaction causing immediate rash, facial/tongue/throat swelling, SOB or lightheadedness with hypotension: Unknown Has patient had a PCN reaction causing severe rash involving mucus membranes or skin necrosis: Unknown Has patient had a PCN reaction that required  hospitalization: No Has patient had a PCN reaction occurring within the last 10 years: No If all of the above answers are "NO", then may proceed with Cephalosporin use.      Medication List    STOP taking these medications   fentaNYL 25 MCG/HR patch Commonly known as:  DURAGESIC - dosed mcg/hr Replaced by:  fentaNYL 12 MCG/HR   oxyCODONE ER 9 MG C12a Commonly known as:  XTAMPZA ER   prochlorperazine 10 MG tablet Commonly known as:  COMPAZINE   triamcinolone cream 0.1 % Commonly known as:  KENALOG     TAKE these medications   albuterol 108 (90 Base) MCG/ACT inhaler Commonly known as:  PROVENTIL HFA;VENTOLIN HFA Inhale 2 puffs into the lungs every 4 (four) hours as needed for wheezing or shortness of breath.   ALPRAZolam 0.5 MG tablet Commonly known as:  XANAX Take 1 tablet (0.5 mg total) by mouth at bedtime as needed for anxiety.   feeding supplement (ENSURE ENLIVE) Liqd Take 237 mLs by mouth 2 (two) times daily between meals.   fentaNYL 12 MCG/HR Commonly known as:  DURAGESIC - dosed mcg/hr Place 3 patches (37.5 mcg total) onto the skin every 3 (three) days. Replaces:  fentaNYL 25 MCG/HR patch   fexofenadine 180 MG tablet Commonly known as:  ALLEGRA Take 180 mg by mouth daily. PRN   fluticasone 50 MCG/ACT nasal spray Commonly known as:  FLONASE Place 2 sprays into both nostrils daily. What changed:    when to take this  reasons to take this   multivitamin capsule Take 1 capsule by mouth daily.   ondansetron 4 MG disintegrating tablet Commonly known as:  ZOFRAN ODT Take 1 tablet (4 mg total) by mouth every 8 (eight) hours as needed for nausea or vomiting.   Oxycodone HCl 10 MG Tabs Take 1 tablet (10 mg total) by mouth every 4 (four) hours as needed for moderate pain. What changed:    medication strength  how much to take  reasons to take this   pantoprazole 40 MG tablet Commonly known as:  PROTONIX Take 1 tablet (40 mg total) by mouth 2 (two)  times daily before a meal.   polyethylene glycol packet Commonly known as:  MIRALAX / GLYCOLAX Take 17 g by mouth daily. Start taking on:  08/20/2017   senna-docusate 8.6-50 MG tablet Commonly known as:  SENNA S Take 2 tablets by mouth daily.   vitamin B-12 500 MCG tablet Commonly known as:  CYANOCOBALAMIN Take 500 mcg by mouth daily.            Durable Medical Equipment  (From admission, onward)        Start     Ordered   08/19/17 0954  DME Oxygen  Once    Question Answer Comment  Mode or (Route) Nasal cannula   Liters per Minute 3   Frequency Continuous (stationary and portable oxygen unit needed)   Oxygen delivery system Gas      08/19/17 0953        Today   CHIEF COMPLAINT:   No  acute events overnight   VITAL SIGNS:  Blood pressure (!) 155/83, pulse 90, temperature 98.9 F (37.2 C), temperature source Oral, resp. rate 17, height 5\' 3"  (1.6 m), weight 79.5 kg (175 lb 4.3 oz), SpO2 95 %.   REVIEW OF SYSTEMS:  Review of Systems  Constitutional: Positive for malaise/fatigue. Negative for chills and fever.  HENT: Negative.  Negative for ear discharge, ear pain, hearing loss, nosebleeds and sore throat.   Eyes: Negative.  Negative for blurred vision and pain.  Respiratory: Positive for cough and shortness of breath. Negative for hemoptysis and wheezing.   Cardiovascular: Negative.  Negative for chest pain, palpitations and leg swelling.  Gastrointestinal: Negative.  Negative for abdominal pain, blood in stool, diarrhea, nausea and vomiting.  Genitourinary: Negative.  Negative for dysuria.  Musculoskeletal: Negative.  Negative for back pain.  Skin: Negative.   Neurological: Negative for dizziness, tremors, speech change, focal weakness, seizures and headaches.  Endo/Heme/Allergies: Negative.  Does not bruise/bleed easily.  Psychiatric/Behavioral: Negative.  Negative for depression, hallucinations and suicidal ideas.     PHYSICAL EXAMINATION:  GENERAL:   77 y.o.-year-old patient lying in the bed with no acute distress.  NECK:  Supple, no jugular venous distention. No thyroid enlargement, no tenderness.  LUNGS: Decreased breath sounds right base and left side CARDIOVASCULAR: S1, S2 normal. No murmurs, rubs, or gallops.  ABDOMEN: Soft, non-tender, non-distended. Bowel sounds present. No organomegaly or mass.  EXTREMITIES: No pedal edema, cyanosis, or clubbing.  PSYCHIATRIC: The patient is alert and oriented x 3.  SKIN: No obvious rash, lesion, or ulcer.   DATA REVIEW:   CBC Recent Labs  Lab 08/17/17 0453  WBC 9.6  HGB 11.4*  HCT 34.2*  PLT 343    Chemistries  Recent Labs  Lab 08/17/17 0453  NA 133*  K 4.1  CL 100*  CO2 26  GLUCOSE 127*  BUN 9  CREATININE 0.55  CALCIUM 9.1  AST 18  ALT 14  ALKPHOS 51  BILITOT 0.6    Cardiac Enzymes No results for input(s): TROPONINI in the last 168 hours.  Microbiology Results  @MICRORSLT48 @  RADIOLOGY:  Dg Chest 2 View  Result Date: 08/18/2017 CLINICAL DATA:  Increasing shortness of breath. Thoracentesis 08/15/2017. Metastatic breast cancer. EXAM: CHEST - 2 VIEW COMPARISON:  08/15/2017 FINDINGS: Lungs are hypoinflated demonstrate mild interval worsening of a moderate size left pleural effusion likely with associated basilar atelectasis. Possible worsening of small to moderate right pleural effusion with associated basilar atelectasis. Mild prominence of the perihilar markings suggesting mild interstitial edema. Cardiomediastinal silhouette and remainder of the exam is unchanged. IMPRESSION: Interval worsening moderate size left pleural effusion and possible mild worsening of small to moderate right pleural effusion likely with associated basilar atelectasis. Mild interstitial edema. Electronically Signed   By: Marin Olp M.D.   On: 08/18/2017 16:02   Dg Abd 1 View  Result Date: 08/18/2017 CLINICAL DATA:  Abdominal distention and known metastatic breast cancer. EXAM: ABDOMEN - 1  VIEW COMPARISON:  07/30/2017 FINDINGS: Bowel gas pattern is nonobstructive. No evidence of free peritoneal air. Surgical clips over the right upper quadrant. Mild degenerate change of the spine. Several pelvic phleboliths. IMPRESSION: Nonobstructive bowel gas pattern. Electronically Signed   By: Marin Olp M.D.   On: 08/18/2017 16:00      Allergies as of 08/19/2017      Reactions   Chocolate Shortness Of Breath   Other Shortness Of Breath, Swelling   PT STATES ALLERGY TO "SOME TYPE OF DYE  THAT WAS USED AT [facility]."  SWELLING TO ARM AND SOB.  PT CANNOT REMEMBER WHAT THE DYE WAS USED FOR.   Sulfa Antibiotics Other (See Comments)   unknown   Atorvastatin Other (See Comments)   Leg pain    Minocycline Hcl Itching   Naproxen Other (See Comments)   Indigestion   Niacin Other (See Comments)   Felt like she was on fire   Septra [sulfamethoxazole-trimethoprim] Other (See Comments)   unknown   Vioxx [rofecoxib] Other (See Comments)   unknown   Penicillins Itching, Rash   Has patient had a PCN reaction causing immediate rash, facial/tongue/throat swelling, SOB or lightheadedness with hypotension: Unknown Has patient had a PCN reaction causing severe rash involving mucus membranes or skin necrosis: Unknown Has patient had a PCN reaction that required hospitalization: No Has patient had a PCN reaction occurring within the last 10 years: No If all of the above answers are "NO", then may proceed with Cephalosporin use.      Medication List    STOP taking these medications   fentaNYL 25 MCG/HR patch Commonly known as:  DURAGESIC - dosed mcg/hr Replaced by:  fentaNYL 12 MCG/HR   oxyCODONE ER 9 MG C12a Commonly known as:  XTAMPZA ER   prochlorperazine 10 MG tablet Commonly known as:  COMPAZINE   triamcinolone cream 0.1 % Commonly known as:  KENALOG     TAKE these medications   albuterol 108 (90 Base) MCG/ACT inhaler Commonly known as:  PROVENTIL HFA;VENTOLIN HFA Inhale 2  puffs into the lungs every 4 (four) hours as needed for wheezing or shortness of breath.   ALPRAZolam 0.5 MG tablet Commonly known as:  XANAX Take 1 tablet (0.5 mg total) by mouth at bedtime as needed for anxiety.   feeding supplement (ENSURE ENLIVE) Liqd Take 237 mLs by mouth 2 (two) times daily between meals.   fentaNYL 12 MCG/HR Commonly known as:  DURAGESIC - dosed mcg/hr Place 3 patches (37.5 mcg total) onto the skin every 3 (three) days. Replaces:  fentaNYL 25 MCG/HR patch   fexofenadine 180 MG tablet Commonly known as:  ALLEGRA Take 180 mg by mouth daily. PRN   fluticasone 50 MCG/ACT nasal spray Commonly known as:  FLONASE Place 2 sprays into both nostrils daily. What changed:    when to take this  reasons to take this   multivitamin capsule Take 1 capsule by mouth daily.   ondansetron 4 MG disintegrating tablet Commonly known as:  ZOFRAN ODT Take 1 tablet (4 mg total) by mouth every 8 (eight) hours as needed for nausea or vomiting.   Oxycodone HCl 10 MG Tabs Take 1 tablet (10 mg total) by mouth every 4 (four) hours as needed for moderate pain. What changed:    medication strength  how much to take  reasons to take this   pantoprazole 40 MG tablet Commonly known as:  PROTONIX Take 1 tablet (40 mg total) by mouth 2 (two) times daily before a meal.   polyethylene glycol packet Commonly known as:  MIRALAX / GLYCOLAX Take 17 g by mouth daily. Start taking on:  08/20/2017   senna-docusate 8.6-50 MG tablet Commonly known as:  SENNA S Take 2 tablets by mouth daily.   vitamin B-12 500 MCG tablet Commonly known as:  CYANOCOBALAMIN Take 500 mcg by mouth daily.            Durable Medical Equipment  (From admission, onward)        Start  Ordered   08/19/17 0954  DME Oxygen  Once    Question Answer Comment  Mode or (Route) Nasal cannula   Liters per Minute 3   Frequency Continuous (stationary and portable oxygen unit needed)   Oxygen delivery  system Gas      08/19/17 0953         Management plans discussed with the patient and she is in agreement. Stable for discharge home with hospice  Patient should follow up with hospice  CODE STATUS:     Code Status Orders  (From admission, onward)        Start     Ordered   08/15/17 0304  Full code  Continuous     08/15/17 0303    Code Status History    Date Active Date Inactive Code Status Order ID Comments User Context   10/01/2016 1539 10/02/2016 1404 Full Code [id number]  Robert Bellow, MD Inpatient      TOTAL TIME TAKING CARE OF THIS PATIENT: 38 minutes.    Note: This dictation was prepared with Dragon dictation along with smaller phrase technology. Any transcriptional errors that result from this process are unintentional.  Nelsy Madonna M.D on 08/19/2017 at 9:54 AM  Between 7am to 6pm - Pager - [phone] After 6pm go to [email or web address] - password [email or web address] [facility] [facility]  Office  [phone]  CC: Primary care physician; Mar Daring, PA-C

## 2017-08-19 NOTE — Progress Notes (Signed)
Patient complains of PIV hurting. When assessed, found to be leaking/occluded. PIV removed. Patient refused to have another PIV inserted.

## 2017-08-19 NOTE — Progress Notes (Signed)
SATURATION QUALIFICATIONS: (This note is used to comply with regulatory documentation for home oxygen)  Patient Saturations on Room Air at Rest = 88%  Patient Saturations on Room Air while Ambulating = 84%  Patient Saturations on 3 Liters of oxygen while Ambulating = 94%  Please briefly explain why patient needs home oxygen:

## 2017-08-19 NOTE — Telephone Encounter (Signed)
VO Called for Palliative care left message on triage voice mail. Patient is undecided on chemotherapy per note from Dr Tasia Catchings, but Per Dr Tasia Catchings consult note she was ok for Palliative care consult in hospital

## 2017-08-20 LAB — CULTURE, BODY FLUID W GRAM STAIN -BOTTLE: Culture: NO GROWTH

## 2017-08-20 LAB — CYTOLOGY - NON PAP

## 2017-08-20 LAB — CULTURE, BODY FLUID-BOTTLE

## 2017-08-20 MED ORDER — ONDANSETRON 4 MG PO TBDP
4.0000 mg | ORAL_TABLET | Freq: Three times a day (TID) | ORAL | Status: DC | PRN
  Administered 2017-08-20: 11:00:00 4 mg via ORAL
  Filled 2017-08-20 (×2): qty 1

## 2017-08-20 MED ORDER — CIPROFLOXACIN HCL 500 MG PO TABS
500.0000 mg | ORAL_TABLET | Freq: Two times a day (BID) | ORAL | Status: DC
  Administered 2017-08-20 (×2): 500 mg via ORAL
  Filled 2017-08-20 (×2): qty 1

## 2017-08-20 NOTE — Plan of Care (Signed)
Urine sent for UA. Came back positive. Urine cloudy and foul smelling. MD made aware. Cipro added. Add on UC. Tylenol and Oxycodone given for pain. Up in chair. Pt said that she sleeps in the chair. Oxygen at 3-4 liters. Uses bedside commode with one person assist.  Problem: Urinary Elimination: Goal: Signs and symptoms of infection will decrease Outcome: Progressing

## 2017-08-20 NOTE — Telephone Encounter (Signed)
Left another message for Ms. Dier daughter regarding scheduling genetic counseling. At Dr. Collie Siad suggestion, I also tried reaching Ms. Deetz in her room at [facility]. There was no answer throughout the day.

## 2017-08-20 NOTE — Progress Notes (Signed)
[place] [facility] [place] NAME: Norma Johns    MR#:  [id number]  DATE OF BIRTH:  1940-05-30  SUBJECTIVE:   going for procedure  REVIEW OF SYSTEMS:    Review of Systems  Constitutional: Negative for fever, chills weight loss HENT: Negative for ear pain, nosebleeds, congestion, facial swelling, rhinorrhea, neck pain, neck stiffness and ear discharge.   Respiratory: Negative for cough, ++shortness of breath, wheezing  Cardiovascular: Negative for chest pain, palpitations and leg swelling.  Gastrointestinal: Negative for heartburn, abdominal pain, vomiting, diarrhea or consitpation Genitourinary: Negative for dysuria, urgency, frequency, hematuria Musculoskeletal: Negative for back pain or joint pain Neurological: Negative for dizziness, seizures, syncope, focal weakness,  numbness and headaches.  Hematological: Does not bruise/bleed easily.  Psychiatric/Behavioral: Negative for hallucinations, confusion, dysphoric mood    Tolerating Diet:yes      DRUG ALLERGIES:   Allergies  Allergen Reactions  . Chocolate Shortness Of Breath  . Other Shortness Of Breath and Swelling    PT STATES ALLERGY TO "SOME TYPE OF DYE THAT WAS USED AT [facility]  SWELLING TO ARM AND SOB.  PT CANNOT REMEMBER WHAT THE DYE WAS USED FOR.  . Sulfa Antibiotics Other (See Comments)    unknown  . Atorvastatin Other (See Comments)    Leg pain   . Minocycline Hcl Itching  . Naproxen Other (See Comments)    Indigestion  . Niacin Other (See Comments)    Felt like she was on fire  . Septra [Sulfamethoxazole-Trimethoprim] Other (See Comments)    unknown  . Vioxx [Rofecoxib] Other (See Comments)    unknown  . Penicillins Itching and Rash    Has patient had a PCN reaction causing immediate rash, facial/tongue/throat swelling, SOB or lightheadedness with hypotension: Unknown Has patient had a PCN reaction causing severe rash involving mucus membranes or skin  necrosis: Unknown Has patient had a PCN reaction that required hospitalization: No Has patient had a PCN reaction occurring within the last 10 years: No If all of the above answers are "NO", then may proceed with Cephalosporin use.     VITALS:  Blood pressure 131/69, pulse (!) 56, temperature 99.4 F (37.4 C), temperature source Oral, resp. rate 17, height 5\' 3"  (1.6 m), weight 79.5 kg (175 lb 4.3 oz), SpO2 96 %.  PHYSICAL EXAMINATION:  Constitutional: Appears well-developed and well-nourished. No distress. HENT: Normocephalic. Marland Kitchen Oropharynx is clear and moist.  Eyes: Conjunctivae and EOM are normal. PERRLA, no scleral icterus.  Neck: Normal ROM. Neck supple. No JVD. No tracheal deviation. CVS: RRR, S1/S2 +, no murmurs, no gallops, no carotid bruit.  Pulmonary: Effort and decreased breath sounds left and lower right no stridor, rhonchi, wheezes, rales.  Abdominal: Soft. BS +,  no distension, tenderness, rebound or guarding.  Musculoskeletal: Normal range of motion. No edema and no tenderness.  Neuro: Alert. CN 2-12 grossly intact. No focal deficits. Skin: Skin is warm and dry. No rash noted. Psychiatric: Normal mood and affect.      LABORATORY PANEL:   CBC Recent Labs  Lab 08/17/17 0453  WBC 9.6  HGB 11.4*  HCT 34.2*  PLT 343   ------------------------------------------------------------------------------------------------------------------  Chemistries  Recent Labs  Lab 08/17/17 0453 08/19/17 1048  NA 133* 133*  K 4.1 4.0  CL 100* 99*  CO2 26 25  GLUCOSE 127* 145*  BUN 9 7  CREATININE 0.55 0.40*  CALCIUM 9.1 9.1  AST 18  --   ALT 14  --  ALKPHOS 51  --   BILITOT 0.6  --    ------------------------------------------------------------------------------------------------------------------  Cardiac Enzymes No results for input(s): TROPONINI in the last 168  hours. ------------------------------------------------------------------------------------------------------------------  RADIOLOGY:  Dg Chest 1 View  Result Date: 08/19/2017 CLINICAL DATA:  Post left thoracentesis EXAM: CHEST  1 VIEW COMPARISON:  08/18/2017 chest radiograph. FINDINGS: Low lung volumes. Stable cardiomediastinal silhouette with top-normal heart size. No pneumothorax. Stable small right pleural effusion. Trace residual left pleural effusion, decreased. No overt pulmonary edema. Improved aeration at the left lung base with mild residual patchy bibasilar lung opacities. IMPRESSION: 1. No pneumothorax. 2. Trace residual left pleural effusion, decreased. Stable small right pleural effusion. 3. Improved aeration at the left lung base, with residual mild patchy bibasilar lung opacities. Electronically Signed   By: Ilona Sorrel M.D.   On: 08/19/2017 15:59   Dg Chest 2 View  Result Date: 08/18/2017 CLINICAL DATA:  Increasing shortness of breath. Thoracentesis 08/15/2017. Metastatic breast cancer. EXAM: CHEST - 2 VIEW COMPARISON:  08/15/2017 FINDINGS: Lungs are hypoinflated demonstrate mild interval worsening of a moderate size left pleural effusion likely with associated basilar atelectasis. Possible worsening of small to moderate right pleural effusion with associated basilar atelectasis. Mild prominence of the perihilar markings suggesting mild interstitial edema. Cardiomediastinal silhouette and remainder of the exam is unchanged. IMPRESSION: Interval worsening moderate size left pleural effusion and possible mild worsening of small to moderate right pleural effusion likely with associated basilar atelectasis. Mild interstitial edema. Electronically Signed   By: Marin Olp M.D.   On: 08/18/2017 16:02   Dg Abd 1 View  Result Date: 08/18/2017 CLINICAL DATA:  Abdominal distention and known metastatic breast cancer. EXAM: ABDOMEN - 1 VIEW COMPARISON:  07/30/2017 FINDINGS: Bowel gas pattern is  nonobstructive. No evidence of free peritoneal air. Surgical clips over the right upper quadrant. Mild degenerate change of the spine. Several pelvic phleboliths. IMPRESSION: Nonobstructive bowel gas pattern. Electronically Signed   By: Marin Olp M.D.   On: 08/18/2017 16:00   US Thoracentesis Asp Pleural Space W/img Guide  Result Date: 08/19/2017 INDICATION: Breast cancer, pleural effusions, shortness of breath EXAM: ULTRASOUND GUIDED LEFT THORACENTESIS MEDICATIONS: 1% lidocaine local COMPLICATIONS: None immediate. PROCEDURE: An ultrasound guided thoracentesis was thoroughly discussed with the patient and questions answered. The benefits, risks, alternatives and complications were also discussed. The patient understands and wishes to proceed with the procedure. Written consent was obtained. Ultrasound was performed to localize and mark an adequate pocket of fluid in the left chest. The area was then prepped and draped in the normal sterile fashion. 1% Lidocaine was used for local anesthesia. Under ultrasound guidance a 6 Fr Safe-T-Centesis catheter was introduced. Thoracentesis was performed. The catheter was removed and a dressing applied. FINDINGS: A total of approximately 675 cc of blood-tinged pleural fluid was removed. Sample was not sent for laboratory analysis IMPRESSION: Successful ultrasound guided left thoracentesis yielding 675 cc of pleural fluid. Electronically Signed   By: Jerilynn Mages.  Shick M.D.   On: 08/19/2017 15:28     ASSESSMENT AND PLAN:   77 year old female with history of metastatic breast cancer status post bilateral mastectomy and depression who presented to the emergency room due to abdominal pain and nausea.  1.  Abdominal pain: This is due to omental carcinomatosis.  Patient was evaluated by oncology while in the hospital.  Recent CT-guided omental biopsy confirmed metastatic adenocarcinoma.  MRI showed with no primary source. Patient will be discharged with hospice due to overall  poor prognosis.  She is  on fentanyl and oxycodone.  2.  Metastatic breast cancer diagnosed June 2018 status post bilateral mastectomy: Patient will follow-up with oncology. Patient is status post left-sided thoracentesis  3.  Acute on chronic hypoxic respiratory failure in the setting of recurrent pleural effusion with shortness of breath/dyspnea due to metastatic pleural effusion status post thoracentesis  4.  Hyponatremia: This is stable  Patient will be discharged home with palliative care care      Management plans discussed with the patient and she is in agreement.  CODE STATUS: dnr  TOTAL TIME TAKING CARE OF THIS PATIENT: 22 minutes.     POSSIBLE D/C today, DEPENDING ON CLINICAL CONDITION.   Helder Crisafulli M.D on 08/20/2017 at 10:45 AM  Between 7am to 6pm - Pager - [phone] After 6pm go to [email or web address] - password [email or web address] [facility]  Office  [phone]  CC: Primary care physician; Mar Daring, PA-C  Note: This dictation was prepared with Dragon dictation along with smaller phrase technology. Any transcriptional errors that result from this process are unintentional.

## 2017-08-20 NOTE — Care Management (Signed)
Ms. Debruyn has acute hypoxemic respiratory failure which requires the upper part of the body to be positioned in ways not feasible with a normal bed. Her head must be elevated at 30-45 degrees or she will go into respiratory distress. Will update Floydene Flock, [facility] representative. Discharge to home today per Dr. Benjie Karvonen. Shelbie Ammons RN MSN CCM Care Management [phone]

## 2017-08-21 ENCOUNTER — Telehealth: Payer: Self-pay

## 2017-08-21 ENCOUNTER — Inpatient Hospital Stay: Payer: Medicare Other

## 2017-08-21 ENCOUNTER — Telehealth: Payer: Self-pay | Admitting: *Deleted

## 2017-08-21 ENCOUNTER — Inpatient Hospital Stay: Payer: Medicare Other | Admitting: Internal Medicine

## 2017-08-21 DIAGNOSIS — Z9981 Dependence on supplemental oxygen: Secondary | ICD-10-CM | POA: Diagnosis not present

## 2017-08-21 DIAGNOSIS — K219 Gastro-esophageal reflux disease without esophagitis: Secondary | ICD-10-CM | POA: Diagnosis not present

## 2017-08-21 DIAGNOSIS — B370 Candidal stomatitis: Secondary | ICD-10-CM

## 2017-08-21 DIAGNOSIS — J90 Pleural effusion, not elsewhere classified: Secondary | ICD-10-CM

## 2017-08-21 DIAGNOSIS — E860 Dehydration: Secondary | ICD-10-CM | POA: Diagnosis not present

## 2017-08-21 DIAGNOSIS — J9601 Acute respiratory failure with hypoxia: Secondary | ICD-10-CM | POA: Diagnosis not present

## 2017-08-21 DIAGNOSIS — M810 Age-related osteoporosis without current pathological fracture: Secondary | ICD-10-CM | POA: Diagnosis not present

## 2017-08-21 DIAGNOSIS — K5903 Drug induced constipation: Secondary | ICD-10-CM

## 2017-08-21 DIAGNOSIS — J45909 Unspecified asthma, uncomplicated: Secondary | ICD-10-CM | POA: Diagnosis not present

## 2017-08-21 DIAGNOSIS — G4733 Obstructive sleep apnea (adult) (pediatric): Secondary | ICD-10-CM | POA: Diagnosis not present

## 2017-08-21 DIAGNOSIS — C799 Secondary malignant neoplasm of unspecified site: Secondary | ICD-10-CM

## 2017-08-21 DIAGNOSIS — J910 Malignant pleural effusion: Secondary | ICD-10-CM | POA: Diagnosis not present

## 2017-08-21 DIAGNOSIS — G893 Neoplasm related pain (acute) (chronic): Secondary | ICD-10-CM | POA: Diagnosis not present

## 2017-08-21 DIAGNOSIS — C772 Secondary and unspecified malignant neoplasm of intra-abdominal lymph nodes: Secondary | ICD-10-CM | POA: Diagnosis not present

## 2017-08-21 DIAGNOSIS — E119 Type 2 diabetes mellitus without complications: Secondary | ICD-10-CM | POA: Diagnosis not present

## 2017-08-21 DIAGNOSIS — Z853 Personal history of malignant neoplasm of breast: Secondary | ICD-10-CM | POA: Diagnosis not present

## 2017-08-21 DIAGNOSIS — R112 Nausea with vomiting, unspecified: Secondary | ICD-10-CM | POA: Diagnosis not present

## 2017-08-21 DIAGNOSIS — Z9013 Acquired absence of bilateral breasts and nipples: Secondary | ICD-10-CM | POA: Diagnosis not present

## 2017-08-21 DIAGNOSIS — D649 Anemia, unspecified: Secondary | ICD-10-CM | POA: Diagnosis not present

## 2017-08-21 DIAGNOSIS — K1379 Other lesions of oral mucosa: Secondary | ICD-10-CM

## 2017-08-21 NOTE — Telephone Encounter (Signed)
Transition Care Management Follow-up Telephone Call  How have you been since you were released from the hospital? Abdominal pain is better but pt is still weak. Currently pt has thrush, which is causing her lips to be sore. Pt is on oxygen at all times. Pt is still nausea. Declines fever, diarrhea or vomiting.  Do you understand why you were in the hospital? yes  Do you have a copy of your discharge instructions Yes Do you understand the discharge instrcutions? yes  Where were you discharged to? Home  Do you have support at home? Yes    Items Reviewed:  Medications obtained Yes  Medications reviewed: Yes  Dietary changes reviewed: no  Home Health? Yes, Agency: [place]  DME ordered at discharge obtained? Yes, oxygen  Medical supplies: Oxygen    Functional Questionnaire:   Activities of Daily Living (ADLs):   She states they are independent in the following: feeding, continence, grooming and toileting States they require assistance with the following: ambulation, bathing and hygiene, dressing and medication management  Any transportation issues/concerns?: no  Any patient concerns? no  Confirmed importance and date/time of follow-up visits scheduled with PCP: yes, 08/27/17 @ 1:20 PM with Fenton Malling.  Confirm appointment scheduled with specialist? NA  Confirmed with patient if condition begins to worsen call PCP or If it's emergency go to the ER.

## 2017-08-21 NOTE — Telephone Encounter (Signed)
Please Review

## 2017-08-21 NOTE — Telephone Encounter (Signed)
[facility] from [facility] called for verbal orders for plan of care nursing, for 1 time for 1 week ,  2 times for 1 week, and 1 time for 2 weeks.  Verbal orders for Home health for 1 time for 1 week and 3 times for 2 weeks.  Order to add social work for [employer].  Needs rx for nystatin or magic mouth wash for sores in her mouth.  Also has sores on the outside of her mouth. Is there a medication for this?  Needs written order faxed (fax # below) for a Nebulizer. Also needs an rx sent to her pharmacy for neb solution.   Fax # [phone]  Also Misty requested a different medication for patient's constipation. Her current stool softner and Miramax are not working.   [employer] [phone]

## 2017-08-22 ENCOUNTER — Telehealth: Payer: Self-pay | Admitting: Physician Assistant

## 2017-08-22 LAB — URINE CULTURE

## 2017-08-22 MED ORDER — NYSTATIN 100000 UNIT/ML MT SUSP: 5.0000 mL | Freq: Four times a day (QID) | OROMUCOSAL | 0 refills | Status: AC

## 2017-08-22 MED ORDER — ALBUTEROL SULFATE (2.5 MG/3ML) 0.083% IN NEBU: 2.5000 mg | INHALATION_SOLUTION | Freq: Four times a day (QID) | RESPIRATORY_TRACT | 1 refills | Status: AC | PRN

## 2017-08-22 MED ORDER — MUPIROCIN 2 % EX OINT: 1.0000 "application " | TOPICAL_OINTMENT | Freq: Two times a day (BID) | CUTANEOUS | 0 refills | Status: AC

## 2017-08-22 MED ORDER — DOCUSATE SODIUM 100 MG PO CAPS: 100.0000 mg | ORAL_CAPSULE | Freq: Two times a day (BID) | ORAL | 5 refills | Status: AC

## 2017-08-22 NOTE — Telephone Encounter (Signed)
Faxed information.

## 2017-08-22 NOTE — Telephone Encounter (Signed)
Received confirmation faxed went through.  Thanks,  -Montina Dorrance

## 2017-08-22 NOTE — Telephone Encounter (Signed)
[facility] for [facility] and nursing orders as noted in below message.   Rx for Nystatin susp, bactroban ointment for external sores, colace and albuterol nebulizer soln sent to [facility].  Will give Rx for nebulizer to be faxed.

## 2017-08-22 NOTE — Telephone Encounter (Signed)
[facility] with [facility] is calling back for verbal ok for plan of Care for nursing and social worker care. She said she left orders yesterday but has not heard back  Please call her at [phone]  Thanks teri

## 2017-08-22 NOTE — Progress Notes (Signed)
Antimicrobial Stewardship Positive Culture Follow Up   Norma Johns is an 77 y.o. female who presented to [facility] on 08/15/2017 with a chief complaint of  Chief Complaint  Patient presents with  . Abdominal Pain    Recent Results (from the past 720 hour(s))  Culture, body fluid-bottle     Status: None   Collection Time: 08/15/17 10:14 AM  Result Value Ref Range Status   Specimen Description FLUID PLEURAL  Final   Special Requests BOTTLES DRAWN AEROBIC AND ANAEROBIC  Final   Culture   Final    NO GROWTH 5 DAYS Performed at [facility], [place] [address], [place], [place] [address]    Report Status 08/20/2017 FINAL  Final  Gram stain     Status: None   Collection Time: 08/15/17 10:14 AM  Result Value Ref Range Status   Specimen Description FLUID PLEURAL  Final   Special Requests NONE  Final   Gram Stain   Final    RARE WBC PRESENT,BOTH PMN AND MONONUCLEAR NO ORGANISMS SEEN Performed at [facility], [place] [address] [place], [place] [address]    Report Status 08/15/2017 FINAL  Final  MRSA PCR Screening     Status: None   Collection Time: 08/16/17  6:22 AM  Result Value Ref Range Status   MRSA by PCR NEGATIVE NEGATIVE Final    Comment:        The GeneXpert MRSA Assay (FDA approved for NASAL specimens only), is one component of a comprehensive MRSA colonization surveillance program. It is not intended to diagnose MRSA infection nor to guide or monitor treatment for MRSA infections. Performed at [facility], [address] [place], [place] [address]   Urine Culture     Status: Abnormal   Collection Time: 08/19/17  2:10 PM  Result Value Ref Range Status   Specimen Description   Final    URINE, CLEAN CATCH Performed at [facility], [address], [place], [place] [address]    Special Requests   Final    NONE Performed at [facility], [address] [place], [place] [address]    Culture >=100,000 COLONIES/mL  ESCHERICHIA COLI (A)  Final   Report Status 08/22/2017 FINAL  Final   Organism ID, Bacteria ESCHERICHIA COLI (A)  Final      Susceptibility   Escherichia coli - MIC*    AMPICILLIN >=32 RESISTANT Resistant     CEFAZOLIN >=64 RESISTANT Resistant     CEFTRIAXONE <=1 SENSITIVE Sensitive     CIPROFLOXACIN >=4 RESISTANT Resistant     GENTAMICIN <=1 SENSITIVE Sensitive     IMIPENEM <=0.25 SENSITIVE Sensitive     NITROFURANTOIN <=16 SENSITIVE Sensitive     TRIMETH/SULFA >=320 RESISTANT Resistant     AMPICILLIN/SULBACTAM >=32 RESISTANT Resistant     PIP/TAZO 64 INTERMEDIATE Intermediate     Extended ESBL NEGATIVE Sensitive     * >=100,000 COLONIES/mL ESCHERICHIA COLI    [x]  Treated with ciprofloxacin, organism resistant to prescribed antimicrobial []  Patient discharged originally without antimicrobial agent and treatment is now indicated  New antibiotic prescription: nitrofurantoin 100 mg bid x 5 days, 0 RF  Instructed patient to stop ciprofloxacin and called prescription into [name] in [place].   Provider: Dr. Eliot Ford, [name] 08/22/2017, 4:56 PM

## 2017-08-22 NOTE — Telephone Encounter (Signed)
Number is not a working number. Will write ok for [facility] and nursing orders on cover sheet when we fax the Rx for the nebulizer.  Thanks,  -Joseline

## 2017-08-22 NOTE — Telephone Encounter (Signed)
Misty advised that S[facility] Baller agreed to orders.

## 2017-08-23 ENCOUNTER — Other Ambulatory Visit: Payer: Self-pay | Admitting: *Deleted

## 2017-08-23 ENCOUNTER — Telehealth: Payer: Self-pay | Admitting: Physician Assistant

## 2017-08-23 DIAGNOSIS — Z853 Personal history of malignant neoplasm of breast: Secondary | ICD-10-CM | POA: Diagnosis not present

## 2017-08-23 DIAGNOSIS — D649 Anemia, unspecified: Secondary | ICD-10-CM | POA: Diagnosis not present

## 2017-08-23 DIAGNOSIS — K219 Gastro-esophageal reflux disease without esophagitis: Secondary | ICD-10-CM | POA: Diagnosis not present

## 2017-08-23 DIAGNOSIS — J9601 Acute respiratory failure with hypoxia: Secondary | ICD-10-CM | POA: Diagnosis not present

## 2017-08-23 DIAGNOSIS — M810 Age-related osteoporosis without current pathological fracture: Secondary | ICD-10-CM | POA: Diagnosis not present

## 2017-08-23 DIAGNOSIS — J910 Malignant pleural effusion: Secondary | ICD-10-CM | POA: Diagnosis not present

## 2017-08-23 DIAGNOSIS — Z9981 Dependence on supplemental oxygen: Secondary | ICD-10-CM | POA: Diagnosis not present

## 2017-08-23 DIAGNOSIS — C772 Secondary and unspecified malignant neoplasm of intra-abdominal lymph nodes: Secondary | ICD-10-CM | POA: Diagnosis not present

## 2017-08-23 DIAGNOSIS — Z9013 Acquired absence of bilateral breasts and nipples: Secondary | ICD-10-CM | POA: Diagnosis not present

## 2017-08-23 DIAGNOSIS — E119 Type 2 diabetes mellitus without complications: Secondary | ICD-10-CM | POA: Diagnosis not present

## 2017-08-23 DIAGNOSIS — G4733 Obstructive sleep apnea (adult) (pediatric): Secondary | ICD-10-CM | POA: Diagnosis not present

## 2017-08-23 DIAGNOSIS — R112 Nausea with vomiting, unspecified: Secondary | ICD-10-CM | POA: Diagnosis not present

## 2017-08-23 DIAGNOSIS — G893 Neoplasm related pain (acute) (chronic): Secondary | ICD-10-CM | POA: Diagnosis not present

## 2017-08-23 DIAGNOSIS — E860 Dehydration: Secondary | ICD-10-CM | POA: Diagnosis not present

## 2017-08-23 DIAGNOSIS — J45909 Unspecified asthma, uncomplicated: Secondary | ICD-10-CM | POA: Diagnosis not present

## 2017-08-23 NOTE — Telephone Encounter (Signed)
Called the pharmacy and they needed clarification for the patch. Jenni seating next to me and clarification was given.  Thanks,  -Arling Cerone

## 2017-08-23 NOTE — Telephone Encounter (Signed)
Pt called saying she is having a problem with the pharmacy and the pain medication patch that the hospital prescribed.  She wants to know if Sonia Baller can contact the pharmacy and see what they need to get her a refill.  She said they need more information,  She uses [facility] in [place]

## 2017-08-23 NOTE — Telephone Encounter (Signed)
Yes please

## 2017-08-23 NOTE — Patient Outreach (Signed)
[facility]) Care Management  08/23/2017  Norma Johns 14-Mar-1940 [id number]  Referral via [place]-EMMI-General Discharge-Day#1, 08/22/2017; Reason: Unfilled prescriptions-"yes"  Hx: Admission 6/20-6/25/2019  Telephone call to patient who was advised of reason for call. HIPPA verification received.  Telephone call to patient who was advised of reason for call. HIPPA verification received.  Patient voices that prescription was just called in by MD office on 08/22/2017 which was on the day that she received call so pharmacy had not filled at that time. States prescription will be picked up today. States she has  follow up appointment scheduled with primary care Tues-July 2nd and has transportation.  States Home health services in place since discharged from hospital. Voices no healthcare concerns at this time.  EMMI call addressed.   Plan: Case closure.  Sherrin Daisy, RN BSN [place] Management Coordinator [facility] Care Management  [phone]

## 2017-08-23 NOTE — Telephone Encounter (Signed)
Verbal orders given  

## 2017-08-23 NOTE — Telephone Encounter (Signed)
I'm guessing this is ok.

## 2017-08-23 NOTE — Telephone Encounter (Signed)
Norma Johns with [facility] is calling to get verbal orders for PT once a week for two weeks, twice a week for one week, once a week for one week.

## 2017-08-26 ENCOUNTER — Other Ambulatory Visit: Payer: Self-pay | Admitting: Anatomic Pathology & Clinical Pathology

## 2017-08-26 DIAGNOSIS — M810 Age-related osteoporosis without current pathological fracture: Secondary | ICD-10-CM | POA: Diagnosis not present

## 2017-08-26 DIAGNOSIS — D649 Anemia, unspecified: Secondary | ICD-10-CM | POA: Diagnosis not present

## 2017-08-26 DIAGNOSIS — K219 Gastro-esophageal reflux disease without esophagitis: Secondary | ICD-10-CM | POA: Diagnosis not present

## 2017-08-26 DIAGNOSIS — Z9981 Dependence on supplemental oxygen: Secondary | ICD-10-CM | POA: Diagnosis not present

## 2017-08-26 DIAGNOSIS — Z9013 Acquired absence of bilateral breasts and nipples: Secondary | ICD-10-CM | POA: Diagnosis not present

## 2017-08-26 DIAGNOSIS — J910 Malignant pleural effusion: Secondary | ICD-10-CM | POA: Diagnosis not present

## 2017-08-26 DIAGNOSIS — Z853 Personal history of malignant neoplasm of breast: Secondary | ICD-10-CM | POA: Diagnosis not present

## 2017-08-26 DIAGNOSIS — C772 Secondary and unspecified malignant neoplasm of intra-abdominal lymph nodes: Secondary | ICD-10-CM | POA: Diagnosis not present

## 2017-08-26 DIAGNOSIS — R112 Nausea with vomiting, unspecified: Secondary | ICD-10-CM | POA: Diagnosis not present

## 2017-08-26 DIAGNOSIS — G4733 Obstructive sleep apnea (adult) (pediatric): Secondary | ICD-10-CM | POA: Diagnosis not present

## 2017-08-26 DIAGNOSIS — J9601 Acute respiratory failure with hypoxia: Secondary | ICD-10-CM | POA: Diagnosis not present

## 2017-08-26 DIAGNOSIS — G893 Neoplasm related pain (acute) (chronic): Secondary | ICD-10-CM | POA: Diagnosis not present

## 2017-08-26 DIAGNOSIS — E860 Dehydration: Secondary | ICD-10-CM | POA: Diagnosis not present

## 2017-08-26 DIAGNOSIS — E119 Type 2 diabetes mellitus without complications: Secondary | ICD-10-CM | POA: Diagnosis not present

## 2017-08-26 DIAGNOSIS — J45909 Unspecified asthma, uncomplicated: Secondary | ICD-10-CM | POA: Diagnosis not present

## 2017-08-26 LAB — SURGICAL PATHOLOGY

## 2017-08-26 NOTE — Telephone Encounter (Signed)
Oral Chemotherapy Pharmacist Encounter   Biopsy proven to not be recurrent breast cancer. Verzenio no longer needed.  Darl Pikes, PharmD, BCPS, [facility] Hematology/Oncology Clinical Pharmacist [facility] [phone]  08/26/2017 2:54 PM

## 2017-08-27 ENCOUNTER — Ambulatory Visit (INDEPENDENT_AMBULATORY_CARE_PROVIDER_SITE_OTHER): Payer: Medicare Other | Admitting: Family Medicine

## 2017-08-27 ENCOUNTER — Inpatient Hospital Stay: Payer: Self-pay | Admitting: Physician Assistant

## 2017-08-27 ENCOUNTER — Telehealth: Payer: Self-pay | Admitting: Genetic Counselor

## 2017-08-27 ENCOUNTER — Encounter: Payer: Self-pay | Admitting: Genetic Counselor

## 2017-08-27 VITALS — BP 134/70 | HR 96 | Temp 98.1°F | Resp 16

## 2017-08-27 DIAGNOSIS — E119 Type 2 diabetes mellitus without complications: Secondary | ICD-10-CM | POA: Diagnosis not present

## 2017-08-27 DIAGNOSIS — C801 Malignant (primary) neoplasm, unspecified: Secondary | ICD-10-CM | POA: Diagnosis not present

## 2017-08-27 DIAGNOSIS — C50919 Malignant neoplasm of unspecified site of unspecified female breast: Secondary | ICD-10-CM | POA: Diagnosis not present

## 2017-08-27 DIAGNOSIS — F419 Anxiety disorder, unspecified: Secondary | ICD-10-CM

## 2017-08-27 DIAGNOSIS — C786 Secondary malignant neoplasm of retroperitoneum and peritoneum: Secondary | ICD-10-CM | POA: Diagnosis not present

## 2017-08-27 MED ORDER — FENTANYL 25 MCG/HR TD PT72: 25.0000 ug | MEDICATED_PATCH | TRANSDERMAL | 0 refills | Status: DC

## 2017-08-27 MED ORDER — ONDANSETRON 8 MG PO TBDP: 8.0000 mg | ORAL_TABLET | Freq: Three times a day (TID) | ORAL | 3 refills | Status: AC | PRN

## 2017-08-27 NOTE — Telephone Encounter (Signed)
Cancer Genetics            Telegenetics Initial Visit    Patient Name: Norma Johns Patient DOB: 15-Oct-1940 Patient Age: 77 y.o. Phone Call Date: 08/27/2017  Referring Provider: Earlie Server, MD  Reason for Visit: Evaluate for hereditary susceptibility to cancer    Assessment and Plan:  . Norma Johns's history is not suggestive of a hereditary predisposition to cancer given her own age at diagnosis of cancer as well as the many unaffected family members on both sides of her family.    . Testing is available to determine whether she has a pathogenic mutation, but we discussed that it will have no impact on her own management at this time. It may provide useful information to family members.  . Norma Johns's blood has already been drawn and is on-hold at [facility]. She wished to pursue genetic testing, but wanted to confirm that there will be no cost to her. I will alert the lab to confirm her cost prior to any testing. Analysis will include the 83 genes on [facility]'s Multi-Cancer panel (ALK, APC, ATM, AXIN2, BAP1, BARD1, BLM, BMPR1A, BRCA1, BRCA2, BRIP1, CASR, CDC73, CDH1, CDK4, CDKN1B, CDKN1C, CDKN2A, CEBPA, CHEK2, CTNNA1, DICER1, DIS3L2, EGFR, EPCAM, FH, FLCN, GATA2, GPC3, GREM1, HOXB13, HRAS, KIT, MAX, MEN1, MET, MITF, MLH1, MSH2, MSH3, MSH6, MUTYH, NBN, NF1, NF2, NTHL1, PALB2, PDGFRA, PHOX2B, PMS2, POLD1, POLE, POT1, PRKAR1A, PTCH1, PTEN, RAD50, RAD51C, RAD51D, RB1, RECQL4, RET, RUNX1, SDHA, SDHAF2, SDHB, SDHC, SDHD, SMAD4, SMARCA4, SMARCB1, SMARCE1, STK11, SUFU, TERC, TERT, TMEM127, TP53, TSC1, TSC2, VHL, WRN, WT1).   . If testing moves forward, results should be available in approximately 1-2 weeks, at which point I will contact her and address implications for her family members, if needed.     Dr. Grayland Ormond was available for questions concerning this case. Total time spent by counseling by phone was approximately 20 minutes.    _____________________________________________________________________   History of Present Illness: Norma Johns, a 77 y.o. female, was referred for genetic counseling to discuss the possibility of a hereditary predisposition to cancer and discuss whether genetic testing is warranted. This was a telegenetics visit via phone.  Norma Johns was diagnosed with breast cancer at the age of 3 and had bilateral mastectomies. She declined radiation and chemotherapy. She was then diagnosed with peritoneal carcinomatosis. She has elected to discontinue treatments and is in in-home hospice.   Oncology History   Patient went for routine mammogram screening in May. Abnormalities was found on the left breast. Patient was called back to have diagnostic mammogram done on 08/08/2016 which showed left breast mass at 4:00 3 cm from nipple and a 4:00 6 cm of the nipple. Indeterminate left axillary lymph nodes.  Patient underwent biopsy on 08/15/2016. Pathology showed invasive lobular carcinoma for both breast lesion, . DCIS present, lymph node biopsy is positive for metastatic lobular carcinoma  MRI of the breast on 09/10/2016 showed the having small no mass area of progressive enhancement, left breast 2 biopsy proven malignancy seen in the left breast were separated by 3.4 cm band of weakly enhancing tissue. An area of stippled discontinuous no masses enhancement extends superiorly in the left breast in 2 the upper outer quadrant spanning approximately 3.2 cm anterior/superior to the more anterior of the 2 biopsy proven malignancy. Recommend MRI guided core biopsy of the treated area of non-mass enhancement. Patient was seen and evaluated by Dr. Tollie Pizza and  position was left modified radical mastectomy, right simple mastectomy with sentinel node biopsy. Patient underwent surgery on 10/01/2016. SHe tolerated the procedure well.  She declined systemic chemotherapy or radiation therapy. She was started on Arimidex  '1mg'$  daily since September. Denies any hotflush or joint pain.  Her wound has healed well and she follows up with surgeon for seroma. She does not complaints about pain. She noticed a small area of fluid collection.       Metastatic breast cancer ([place])   07/25/2017 Initial Diagnosis    Metastatic breast cancer ([place])      07/30/2017 - 08/27/2017 Chemotherapy    The patient had fulvestrant (FASLODEX) injection 500 mg, 500 mg, Intramuscular,  Once, 1 of 4 cycles Administration: 500 mg (07/31/2017), 500 mg (08/14/2017)  for chemotherapy treatment.       08/17/2017 -  Chemotherapy    The patient had palonosetron (ALOXI) injection 0.25 mg, 0.25 mg, Intravenous,  Once, 0 of 6 cycles CARBOplatin (PARAPLATIN) in sodium chloride 0.9 % 100 mL chemo infusion, , Intravenous,  Once, 0 of 6 cycles Dose modification:   (Cycle 4) gemcitabine (GEMZAR) 1,862 mg in sodium chloride 0.9 % 100 mL chemo infusion, 1,000 mg/m2, Intravenous,  Once, 0 of 6 cycles  for chemotherapy treatment.        Carcinoma of unknown primary ([place])   08/17/2017 Initial Diagnosis    Carcinoma of unknown primary ([place])      08/17/2017 -  Chemotherapy    The patient had palonosetron (ALOXI) injection 0.25 mg, 0.25 mg, Intravenous,  Once, 0 of 6 cycles CARBOplatin (PARAPLATIN) in sodium chloride 0.9 % 100 mL chemo infusion, , Intravenous,  Once, 0 of 6 cycles Dose modification:   (Cycle 4) gemcitabine (GEMZAR) 1,862 mg in sodium chloride 0.9 % 100 mL chemo infusion, 1,000 mg/m2, Intravenous,  Once, 0 of 6 cycles  for chemotherapy treatment.        Past Medical History:  Diagnosis Date  . Anemia   . Anxiety   . Arthritis   . Asthma    WELL CONTROLLED  . Breast cancer ([place]) 2018   left breast cnacer  . Cancer ([place])    Bilat Mastectomy  . Depression   . GERD (gastroesophageal reflux disease)   . Headache    H/O  . Hyperlipidemia   . Metastatic breast cancer ([place]) 07/25/2017  . Metastatic breast cancer ([place]) 07/25/2017  .  Sleep apnea    DOES NOT USE CPAP-COULD NOT TOLERATE  . Stroke [facility]) 2004    Past Surgical History:  Procedure Laterality Date  . ABDOMINAL HYSTERECTOMY    . BLADDER REPAIR    . BREAST BIOPSY Right 03/23/1991   Fibroadenoma with intramammary lymph node. Right breast, 9:00.  [facility] CHOLECYSTECTOMY    . COLONOSCOPY  2005  . MASTECTOMY MODIFIED RADICAL Left 10/01/2016   Procedure: MASTECTOMY MODIFIED RADICAL;  Surgeon: Robert Bellow, MD;  Location: [facility] ORS;  Service: General;  Laterality: Left;  . MR MRA CAROTID  02/2010   Minimal plaque formation; no significant stenosis. Sx: Syncope  . [facility]  . SENTINEL NODE BIOPSY Right 10/01/2016   Procedure: SENTINEL NODE BIOPSY;  Surgeon: Robert Bellow, MD;  Location: [facility] ORS;  Service: General;  Laterality: Right;  . SIMPLE MASTECTOMY WITH AXILLARY SENTINEL NODE BIOPSY Right 10/01/2016   Procedure: SIMPLE MASTECTOMY;  Surgeon: Robert Bellow, MD;  Location: [facility] ORS;  Service: General;  Laterality: Right;  Family History: Significant diagnoses include the following:  Family History  Problem Relation Age of Onset  . Prostate cancer Father 48       deceased 31s  . Heart attack Mother        deceased 12  . Hypertension Mother   . CAD Mother   . Other Daughter 12       brain tumor; type not specified  . Hypertension Son   . Diabetes Son   . Lung cancer Brother 74       smoker; deceased 6  . Leukemia Brother 29       deceased 44  . Prostate cancer Brother 39       deceased 16  . Cancer Maternal Uncle        cancer in 3 uncles; unsure of types  . Lung cancer Maternal Aunt   . Breast cancer Other 39       daughter of unaffected sister    Additionally, Norma Johns has a son (age 60) and a daughter (age 44). She had a total of 5 brothers and a sister. One brother and her sister are still living. Her mother had a total of 12 siblings. See above for cancer history. Her father had 3 brothers and a  sister. No cancers are reported.  Norma Johns ancestry is Caucasian and Native American. There is no known Jewish ancestry and no consanguinity.  Discussion: We reviewed the characteristics, features and inheritance patterns of hereditary cancer syndromes. We discussed her risk of harboring a mutation in the context of her personal and family history. We discussed the process of genetic testing, insurance coverage and implications of results: positive, negative and variant of unknown significance (VUS).   Norma Johns questions were answered to her satisfaction today and she is welcome to call with any additional questions or concerns. Thank you for the referral and allowing [place] to share in the care of your patient.    Steele Berg, MS, [place] Certified Genetic Counselor phone: [phone]

## 2017-08-27 NOTE — Progress Notes (Signed)
/      Patient: Norma Johns Female    DOB: [birth year]   77 y.o.   MRN: [id number] Visit Date: 08/27/2017  Today's Provider: Wilhemena Durie, MD   Chief Complaint  Patient presents with  . Hospitalization Follow-up   Subjective:    HPI Patient comes in today for a hospital follow up. She was seen at [facility] on 08/15/17 and was discharged on 08/20/17.   She reports that she was having severe abdominal pain and it was noted that she has  Omental carcinomatosis. Patient reports that she is still being followed by oncology. She has advance home care to visit 3 times weekly.      Allergies  Allergen Reactions  . Chocolate Shortness Of Breath  . Other Shortness Of Breath and Swelling    PT STATES ALLERGY TO "SOME TYPE OF DYE THAT WAS USED AT [facility]."  SWELLING TO ARM AND SOB.  PT CANNOT REMEMBER WHAT THE DYE WAS USED FOR.  . Sulfa Antibiotics Other (See Comments)    unknown  . Atorvastatin Other (See Comments)    Leg pain   . Minocycline Hcl Itching  . Naproxen Other (See Comments)    Indigestion  . Niacin Other (See Comments)    Felt like she was on fire  . Septra [Sulfamethoxazole-Trimethoprim] Other (See Comments)    unknown  . Vioxx [Rofecoxib] Other (See Comments)    unknown  . Penicillins Itching and Rash    Has patient had a PCN reaction causing immediate rash, facial/tongue/throat swelling, SOB or lightheadedness with hypotension: Unknown Has patient had a PCN reaction causing severe rash involving mucus membranes or skin necrosis: Unknown Has patient had a PCN reaction that required hospitalization: No Has patient had a PCN reaction occurring within the last 10 years: No If all of the above answers are "NO", then may proceed with Cephalosporin use.      Current Outpatient Medications:  .  albuterol (PROVENTIL HFA;VENTOLIN HFA) 108 (90 Base) MCG/ACT inhaler, Inhale 2 puffs into the lungs every 4 (four) hours as needed for wheezing or shortness of  breath., Disp: 1 Inhaler, Rfl: 11 .  albuterol (PROVENTIL) (2.5 MG/3ML) 0.083% nebulizer solution, Take 3 mLs (2.5 mg total) by nebulization every 6 (six) hours as needed for wheezing or shortness of breath., Disp: 150 mL, Rfl: 1 .  ALPRAZolam (XANAX) 0.5 MG tablet, Take 1 tablet (0.5 mg total) by mouth at bedtime as needed for anxiety., Disp: 30 tablet, Rfl: 5 .  docusate sodium (COLACE) 100 MG capsule, Take 1 capsule (100 mg total) by mouth 2 (two) times daily., Disp: 60 capsule, Rfl: 5 .  feeding supplement, ENSURE ENLIVE, (ENSURE ENLIVE) LIQD, Take 237 mLs by mouth 2 (two) times daily between meals. (Patient taking differently: Take 237 mLs by mouth 2 (two) times daily between meals. ), Disp: 237 mL, Rfl: 12 .  fentaNYL (DURAGESIC - DOSED MCG/HR) 12 MCG/HR, Place 3 patches (37.5 mcg total) onto the skin every 3 (three) days., Disp: 5 patch, Rfl: 0 .  fexofenadine (ALLEGRA) 180 MG tablet, Take 180 mg by mouth daily. PRN, Disp: , Rfl:  .  fluticasone (FLONASE) 50 MCG/ACT nasal spray, Place 2 sprays into both nostrils daily. (Patient taking differently: Place 2 sprays into both nostrils daily as needed for allergies. ), Disp: 16 g, Rfl: 5 .  lidocaine-prilocaine (EMLA) cream, Apply 1 application topically once., Disp: , Rfl:  .  Multiple Vitamin (MULTIVITAMIN) capsule, Take 1 capsule by mouth daily.,  Disp: , Rfl:  .  mupirocin ointment (BACTROBAN) 2 %, Place 1 application into the nose 2 (two) times daily., Disp: 22 g, Rfl: 0 .  nystatin (MYCOSTATIN) 100000 UNIT/ML suspension, Take 5 mLs (500,000 Units total) by mouth 4 (four) times daily., Disp: 240 mL, Rfl: 0 .  ondansetron (ZOFRAN ODT) 4 MG disintegrating tablet, Take 1 tablet (4 mg total) by mouth every 8 (eight) hours as needed for nausea or vomiting., Disp: 20 tablet, Rfl: 0 .  oxyCODONE 10 MG TABS, Take 1 tablet (10 mg total) by mouth every 4 (four) hours as needed for moderate pain., Disp: 30 tablet, Rfl: 0 .  pantoprazole (PROTONIX) 40 MG  tablet, Take 1 tablet (40 mg total) by mouth 2 (two) times daily before a meal., Disp: 180 tablet, Rfl: 1 .  polyethylene glycol (MIRALAX / GLYCOLAX) packet, Take 17 g by mouth daily., Disp: 14 each, Rfl: 0 .  senna-docusate (SENNA S) 8.6-50 MG tablet, Take 2 tablets by mouth daily., Disp: 60 tablet, Rfl: 3 .  vitamin B-12 (CYANOCOBALAMIN) 500 MCG tablet, Take 500 mcg by mouth daily. , Disp: , Rfl:   Review of Systems  Constitutional: Positive for activity change, appetite change and fatigue.  HENT: Negative.   Eyes: Negative.   Respiratory: Positive for shortness of breath. Negative for cough.   Cardiovascular: Negative for chest pain, palpitations and leg swelling.  Endocrine: Negative for cold intolerance, heat intolerance, polyphagia and polyuria.  Musculoskeletal: Positive for arthralgias and myalgias.  Skin: Negative.   Allergic/Immunologic: Negative.   Neurological: Positive for headaches. Negative for dizziness.  Hematological: Negative.     Social History   Tobacco Use  . Smoking status: Never Smoker  . Smokeless tobacco: Never Used  Substance Use Topics  . Alcohol use: No   Objective:   BP 134/70 (BP Location: Left Arm, Patient Position: Sitting, Cuff Size: Normal)   Pulse 96   Temp 98.1 F (36.7 C)   Resp 16  Vitals:   08/27/17 1459  BP: 134/70  Pulse: 96  Resp: 16  Temp: 98.1 F (36.7 C)     Physical Exam  Constitutional: She is oriented to person, place, and time. She appears well-developed and well-nourished.  HENT:  Head: Normocephalic and atraumatic.  Right Ear: External ear normal.  Left Ear: External ear normal.  Nose: Nose normal.  Eyes: Conjunctivae are normal. No scleral icterus.  Neck: No thyromegaly present.  Cardiovascular: Normal rate, regular rhythm and normal heart sounds.  Pulmonary/Chest: Effort normal and breath sounds normal.  Abdominal: Soft.  Musculoskeletal: She exhibits no edema.  Neurological: She is alert and oriented to  person, place, and time.  Skin: Skin is warm and dry.  Psychiatric: She has a normal mood and affect. Her behavior is normal. Judgment and thought content normal.        Assessment & Plan:     Breast Cancer Omental Carcinomatosis In review of pt record and presentation Palliative care is appropriate. More than 50% of 25 minute visit spent in counseling.Pt has had weight loss of more than 25 lbs.Palliative care appt July 31. Nausea and Abdominal pain Increase ondansetron to 8mg  and Duragesit fro 12 to 25 mcg q 3 days.RTC 1 month.    I have done the exam and reviewed the chart and it is accurate to the best of my knowledge. Development worker, community has been used and  any errors in dictation or transcription are unintentional. Miguel Aschoff M.D. [facility]   Wilhemena Durie, MD  [facility]

## 2017-08-28 ENCOUNTER — Ambulatory Visit: Payer: Medicare Other

## 2017-08-28 ENCOUNTER — Inpatient Hospital Stay: Payer: Medicare Other | Admitting: Oncology

## 2017-08-28 ENCOUNTER — Inpatient Hospital Stay: Payer: Medicare Other

## 2017-08-28 ENCOUNTER — Telehealth: Payer: Self-pay | Admitting: *Deleted

## 2017-08-28 ENCOUNTER — Telehealth: Payer: Self-pay | Admitting: Oncology

## 2017-08-28 NOTE — Telephone Encounter (Signed)
Patient's daughter called and request callback having questions.  I called daughter and answered all her questions.  Per daughter patient is currently discharged at home with palliative care.  Not official hospice patient.  Patient reports feeling weaker. I called the patient and talk to her.  She reports breathing is well.  She is feeling weak and weaker. I discussed with patient about enrolling to hospice care at home.  She agrees with the plan.  Goal of care is focusing on comfort care.  I am happy to serve the attending for hospice care.  Patient expresses her appreciation of our cancer service. She will call if she has more questions.

## 2017-08-28 NOTE — Telephone Encounter (Signed)
Patient's daughter [name]) called @ 8:40 and stated that her mother was not feeling well and will not be able  to make it to her lab/MD appts. today. She asked if Dr. Tasia Catchings could please give her a call back sometime today  @ [phone] because she has some very important questions to ask her.

## 2017-08-29 DIAGNOSIS — Z9013 Acquired absence of bilateral breasts and nipples: Secondary | ICD-10-CM | POA: Diagnosis not present

## 2017-08-29 DIAGNOSIS — J45909 Unspecified asthma, uncomplicated: Secondary | ICD-10-CM | POA: Diagnosis not present

## 2017-08-29 DIAGNOSIS — E119 Type 2 diabetes mellitus without complications: Secondary | ICD-10-CM | POA: Diagnosis not present

## 2017-08-29 DIAGNOSIS — G4733 Obstructive sleep apnea (adult) (pediatric): Secondary | ICD-10-CM | POA: Diagnosis not present

## 2017-08-29 DIAGNOSIS — K219 Gastro-esophageal reflux disease without esophagitis: Secondary | ICD-10-CM | POA: Diagnosis not present

## 2017-08-29 DIAGNOSIS — Z853 Personal history of malignant neoplasm of breast: Secondary | ICD-10-CM | POA: Diagnosis not present

## 2017-08-29 DIAGNOSIS — J9601 Acute respiratory failure with hypoxia: Secondary | ICD-10-CM | POA: Diagnosis not present

## 2017-08-29 DIAGNOSIS — C772 Secondary and unspecified malignant neoplasm of intra-abdominal lymph nodes: Secondary | ICD-10-CM | POA: Diagnosis not present

## 2017-08-29 DIAGNOSIS — J910 Malignant pleural effusion: Secondary | ICD-10-CM | POA: Diagnosis not present

## 2017-08-29 DIAGNOSIS — D649 Anemia, unspecified: Secondary | ICD-10-CM | POA: Diagnosis not present

## 2017-08-29 DIAGNOSIS — E860 Dehydration: Secondary | ICD-10-CM | POA: Diagnosis not present

## 2017-08-29 DIAGNOSIS — Z9981 Dependence on supplemental oxygen: Secondary | ICD-10-CM | POA: Diagnosis not present

## 2017-08-29 DIAGNOSIS — G893 Neoplasm related pain (acute) (chronic): Secondary | ICD-10-CM | POA: Diagnosis not present

## 2017-08-29 DIAGNOSIS — R112 Nausea with vomiting, unspecified: Secondary | ICD-10-CM | POA: Diagnosis not present

## 2017-08-29 DIAGNOSIS — M810 Age-related osteoporosis without current pathological fracture: Secondary | ICD-10-CM | POA: Diagnosis not present

## 2017-08-30 ENCOUNTER — Telehealth: Payer: Self-pay | Admitting: Physician Assistant

## 2017-08-30 NOTE — Telephone Encounter (Signed)
FYI

## 2017-08-30 NOTE — Telephone Encounter (Signed)
Noted  

## 2017-08-30 NOTE — Telephone Encounter (Signed)
Janelle from [place] wanted to let you know that patient missed her PT appointment this week due to not feeling well.

## 2017-09-03 ENCOUNTER — Other Ambulatory Visit: Payer: Self-pay | Admitting: Oncology

## 2017-09-03 ENCOUNTER — Telehealth: Payer: Self-pay | Admitting: *Deleted

## 2017-09-03 MED ORDER — FENTANYL 50 MCG/HR TD PT72: 50.0000 ug | MEDICATED_PATCH | TRANSDERMAL | 0 refills | Status: DC

## 2017-09-03 NOTE — Telephone Encounter (Signed)
Hospice called to report that the patient's pain was not being managed on current dose of fentaNYL suggested increasing to 50 mcg. Patient is also requesting paracentesis to relieve abdominal bloating. Please call hospice back with plan. [name] [phone].

## 2017-09-03 NOTE — Telephone Encounter (Signed)
Fentanyl patch 92mcg Rx sent to pharmacy.  Attempted to call hospice at the number provided, no one answered.  Please let patient know to pick up Rx.   Regarding paracentesis, this has been attempted for multiple times, most recently done on 08/15/2017, however there was very small amount of ascites not able to tap. I have previously explained to patient that the extreme distension is caused by peritoneal carcinomatosis, not ascites.

## 2017-09-04 ENCOUNTER — Telehealth: Payer: Self-pay | Admitting: Genetic Counselor

## 2017-09-04 NOTE — Telephone Encounter (Signed)
Norma Johns informed.and will discuss with patient

## 2017-09-04 NOTE — Telephone Encounter (Signed)
Cancer Genetics             Telegenetics Results Disclosure   Patient Name: Norma Johns Patient DOB: August 17, 1940 Patient Age: 77 y.o. Phone Call Date: 09/04/2017  Referring Provider: Earlie Server, MD    Norma Johns was called today to discuss genetic test results. Please see the Genetics telephone note from 08/27/2017 for a detailed discussion of her personal and family histories and the recommendations provided.  Genetic Testing: At the time of Norma Johns telegenetics visit, she decided to pursue genetic testing of multiple genes associated with hereditary susceptibility to cancer. Testing included sequencing and deletion/duplication analysis. Testing did not reveal a pathogenic mutation in any of the genes analyzed.  A copy of the genetic test report will be scanned into Epic under the Media tab.  The genes analyzed were the 83 genes on [facility]'s Multi-Cancer panel (ALK, APC, ATM, AXIN2, BAP1, BARD1, BLM, BMPR1A, BRCA1, BRCA2, BRIP1, CASR, CDC73, CDH1, CDK4, CDKN1B, CDKN1C, CDKN2A, CEBPA, CHEK2, CTNNA1, DICER1, DIS3L2, EGFR, EPCAM, FH, FLCN, GATA2, GPC3, GREM1, HOXB13, HRAS, KIT, MAX, MEN1, MET, MITF, MLH1, MSH2, MSH3, MSH6, MUTYH, NBN, NF1, NF2, NTHL1, PALB2, PDGFRA, PHOX2B, PMS2, POLD1, POLE, POT1, PRKAR1A, PTCH1, PTEN, RAD50, RAD51C, RAD51D, RB1, RECQL4, RET, RUNX1, SDHA, SDHAF2, SDHB, SDHC, SDHD, SMAD4, SMARCA4, SMARCB1, SMARCE1, STK11, SUFU, TERC, TERT, TMEM127, TP53, TSC1, TSC2, VHL, WRN, WT1).  Since the current test is not perfect, it is possible that there may be a gene mutation that current testing cannot detect, but that chance is small. It is possible that a different genetic factor, which has not yet been discovered or is not on this panel, is responsible for the cancer diagnoses in the family. Again, the likelihood of this is low. No additional testing is recommended at this time for Norma Johns.  A Variant of Uncertain Significance was detected: POLE  [id number] ([id number].[id number]). This is still considered a normal result. While at this time, it is unknown if this finding is associated with increased cancer risk, the majority of these variants get reclassified to be inconsequential. Medical management should not be based on this finding. With time, we suspect the lab will determine the significance, if any. If we do learn more about it, we will try to contact Norma Johns to discuss it further. It is important to stay in touch with [place] periodically and keep the address and phone number up to date.  Cancer Screening: These results suggest that Norma Johns cancer was most likely not due to an inherited predisposition. Most cancers happen by chance and this test, along with details of her family history, suggests that her cancer falls into this category. She is recommended to follow the cancer screening guidelines provided by her physician.   Family Members: Family members may be at some increased risk of developing cancer, over the general population risk, simply due to the family history. They are recommended to speak with their own providers about appropriate cancer screenings.   Any relative who had cancer at a young age or had a particularly rare cancer may also wish to pursue genetic testing. Genetic counselors can be located in other cities, by visiting the website of the [employer] ([employer]) and Field seismologist for a Dietitian by zip code.   Family members are not recommended to get tested for the above VUS outside of a research protocol as  this finding has no implications for their medical management.    Norma Johns questions were answered to her satisfaction today, and she knows she is welcome to call anytime with additional questions.    Steele Berg, MS, [place] Certified Genetic Counselor phone: [phone]

## 2017-09-09 ENCOUNTER — Telehealth: Payer: Self-pay | Admitting: *Deleted

## 2017-09-09 ENCOUNTER — Other Ambulatory Visit: Payer: Self-pay | Admitting: Oncology

## 2017-09-09 MED ORDER — MORPHINE SULFATE (CONCENTRATE) 20 MG/ML PO SOLN: 20.0000 mg | ORAL | 0 refills | Status: AC | PRN

## 2017-09-09 NOTE — Telephone Encounter (Signed)
Hospice called to report patient's pain not under control and she is unable to keep pills down. Recommend Roxanol 20mg /ml.

## 2017-09-09 NOTE — Telephone Encounter (Signed)
Lorri,  Rx was sent to pharmacy.  Please let hospice RN know if still not able to control her pain, recommend inpatient hospice for better pain control.   Talbert Cage

## 2017-09-10 NOTE — Telephone Encounter (Signed)
Called Hospice and left message for patients nurse to keep Dr. Tasia Catchings informed of patient's pain management. Patient may be a candidate for inpatient hospice.

## 2017-09-11 ENCOUNTER — Encounter: Payer: Self-pay | Admitting: *Deleted

## 2017-09-16 ENCOUNTER — Other Ambulatory Visit: Payer: Self-pay | Admitting: *Deleted

## 2017-09-16 MED ORDER — FENTANYL 25 MCG/HR TD PT72: 25.0000 ug | MEDICATED_PATCH | TRANSDERMAL | 0 refills | Status: DC

## 2017-09-16 NOTE — Telephone Encounter (Signed)
[facility] nurse called asking if patient Fentanyl can be increased to 75 mcg. She is vomiting frequently and cannot keep Oxycodone down. She currently has a box 50 mcg patches on hand so will need a box of 25 mcg patches

## 2017-09-18 ENCOUNTER — Other Ambulatory Visit: Payer: Self-pay | Admitting: *Deleted

## 2017-09-18 NOTE — Telephone Encounter (Signed)
Hospice nurse called and requested refill of Oxycodone. I returned call to her and got voice mail that patient is no longer on Oxycodone, 7/15 it was d/c and she was given 240 ml of Roxanol due to being unable to take pills.

## 2017-09-19 ENCOUNTER — Other Ambulatory Visit: Payer: Self-pay | Admitting: *Deleted

## 2017-09-19 MED ORDER — OXYCODONE HCL 5 MG PO TABS: 5.0000 mg | ORAL_TABLET | ORAL | 0 refills | Status: AC | PRN

## 2017-09-19 NOTE — Telephone Encounter (Signed)
This is a Dr. Tasia Catchings patient. I will forward to her team.

## 2017-09-19 NOTE — Telephone Encounter (Signed)
Hospice requesting refill of Oxycodone which was discontinued due to patient being unable to swallow them. And she was started on Roxanol. Patient is now able to swallow Oxycodone and has stopped the Roxanol stating she does not like it and wants Oxycodone. Please advise

## 2017-09-27 ENCOUNTER — Other Ambulatory Visit: Payer: Self-pay | Admitting: *Deleted

## 2017-09-27 MED ORDER — FENTANYL 75 MCG/HR TD PT72: 75.0000 ug | MEDICATED_PATCH | TRANSDERMAL | 0 refills | Status: DC

## 2017-10-02 ENCOUNTER — Other Ambulatory Visit: Payer: Self-pay | Admitting: *Deleted

## 2017-10-02 MED ORDER — FENTANYL 75 MCG/HR TD PT72: 75.0000 ug | MEDICATED_PATCH | TRANSDERMAL | 0 refills | Status: AC

## 2017-10-17 ENCOUNTER — Ambulatory Visit: Payer: Self-pay | Admitting: General Surgery

## 2017-10-27 DEATH — deceased

## 2019-03-21 IMAGING — CT CT CHEST W/ CM
2 of 3 series · 15 of 36 positions shown, 18 images · IV contrast (iopamidol)
Comparison: 07/30/2017 CT of the chest. 08/11/2017 CT abdomen and
pelvis.

CLINICAL DATA: 76 y/o F; history of breast cancer with metastasis
to the stomach presenting with right-sided abdominal pain.

EXAM:
CT CHEST WITH CONTRAST
TECHNIQUE: Multidetector CT imaging of the chest was performed during
intravenous contrast administration.
CONTRAST:  100mL [id number] IOPAMIDOL ([id number]) INJECTION 76%

[Series 2: axial st · axial · 0.77mm/px · z∈[-447,-241]mm · 12 of 123 slices shown, 15 images]
[im 10/123  mediastinal]
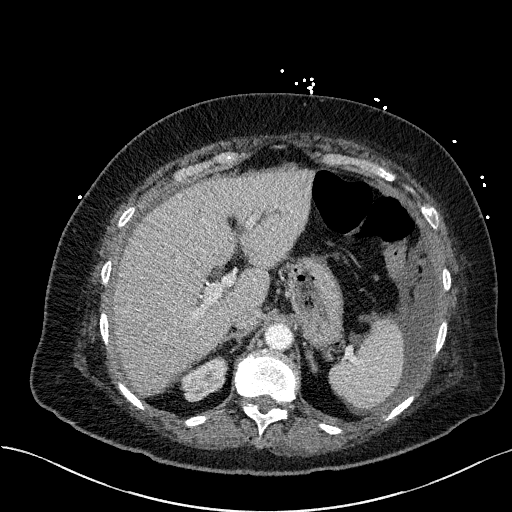
[im 10/123  lung]
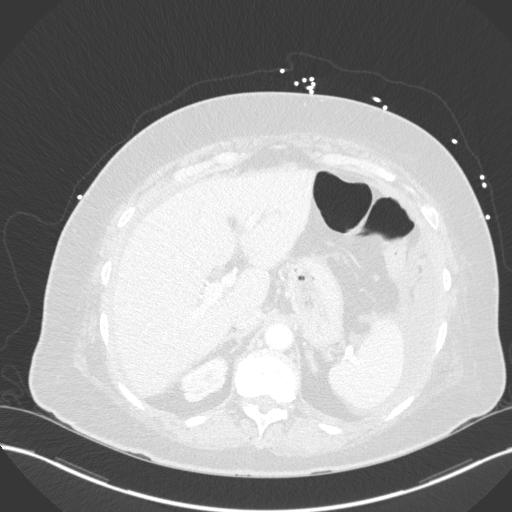
[im 19/123  lung]
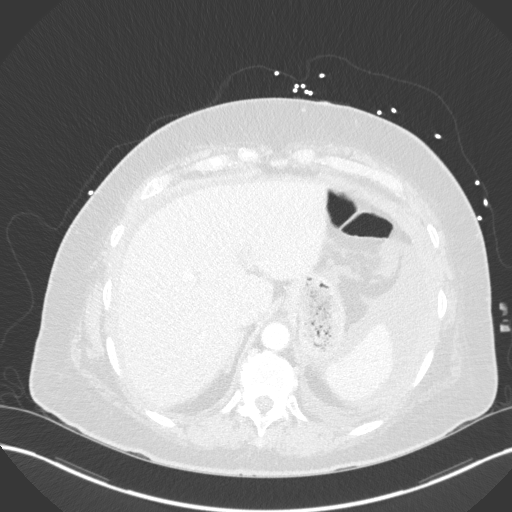
[im 28/123  lung]
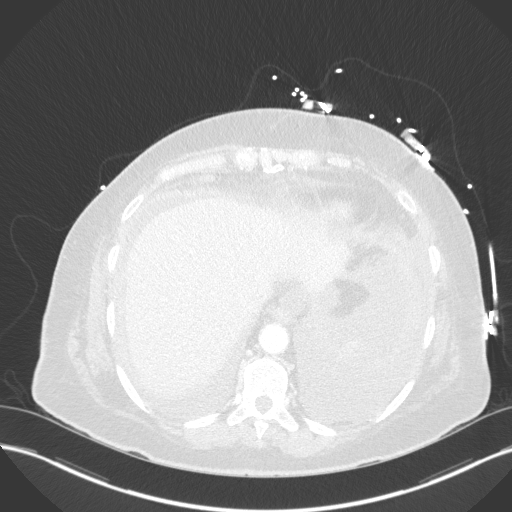
[im 37/123  lung]
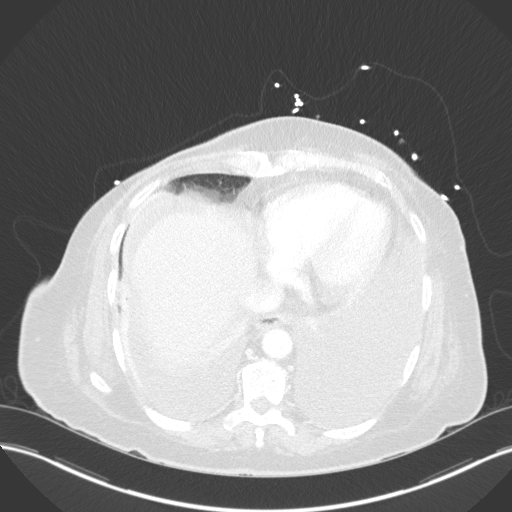
[im 46/123  mediastinal]
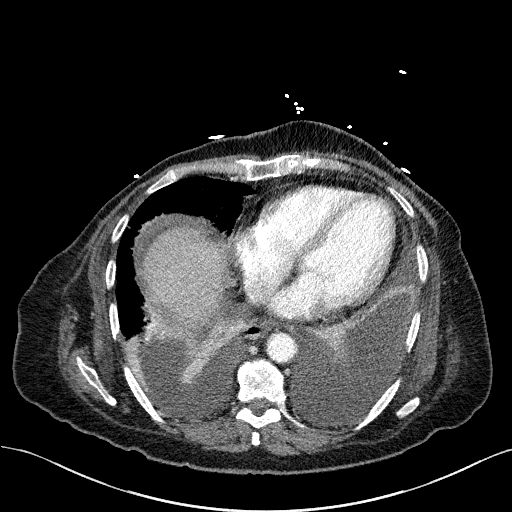
[im 46/123  lung]
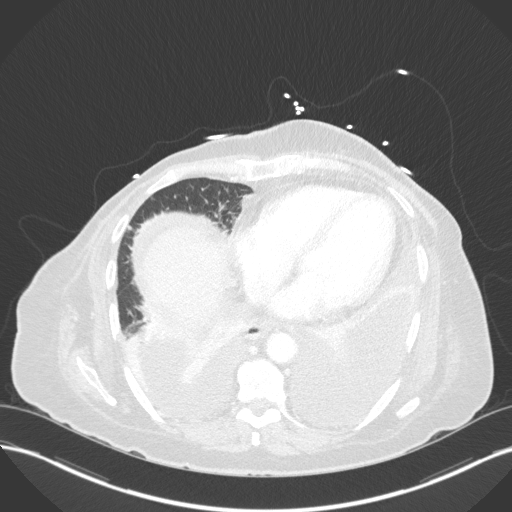
[im 55/123  lung]
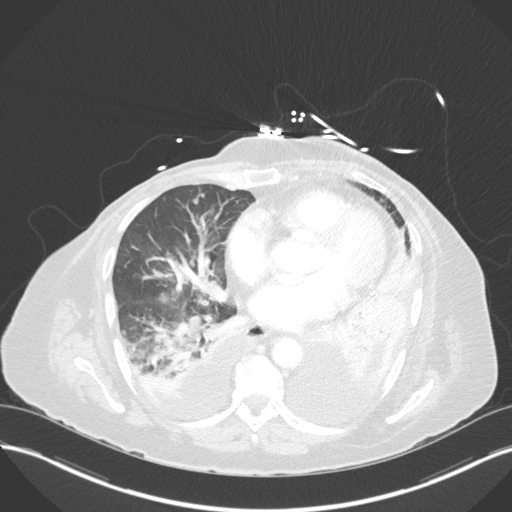
[im 68/123  lung]
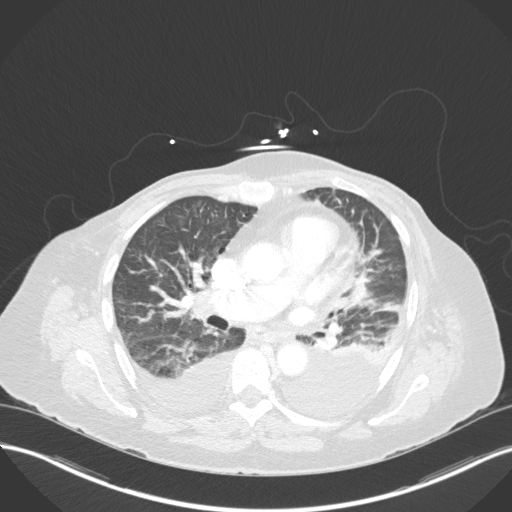
[im 77/123  lung]
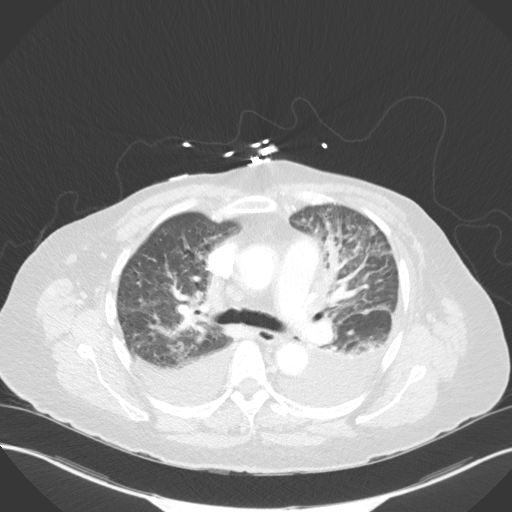
[im 86/123  mediastinal]
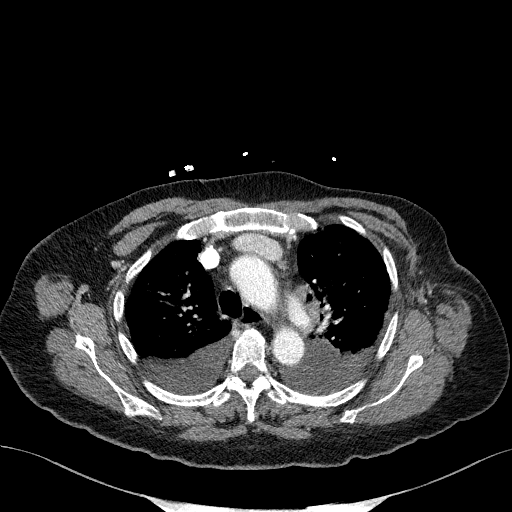
[im 86/123  lung]
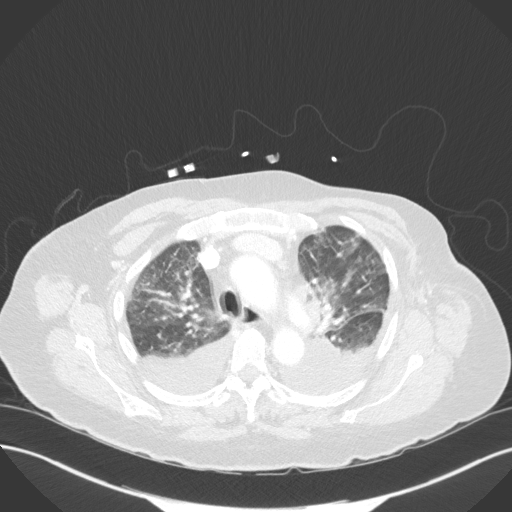
[im 95/123  lung]
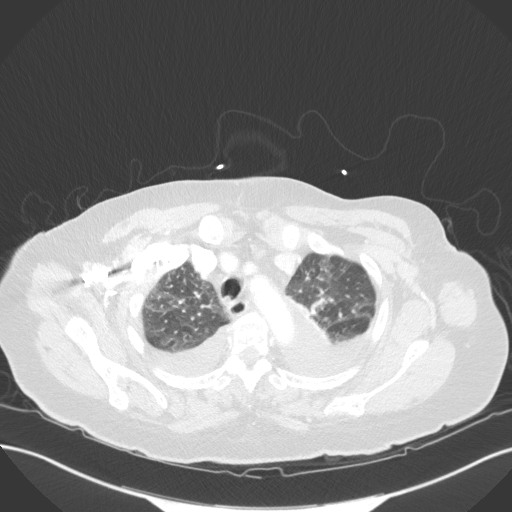
[im 104/123  lung]
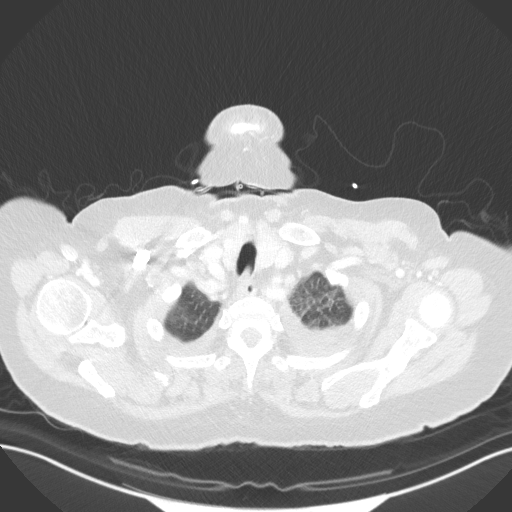
[im 113/123  lung]
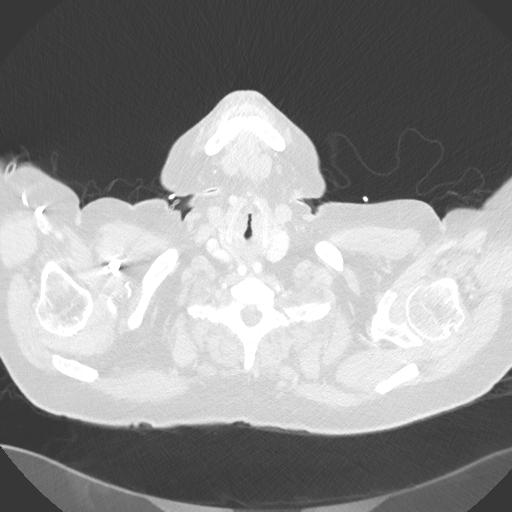

[Series 5: coronal · coronal · 0.54mm/px · 3 of 121 slices shown]
[im 25/121  lung]
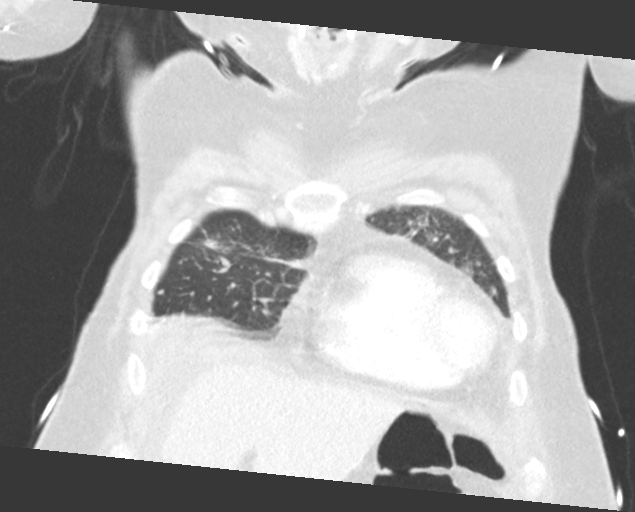
[im 49/121  lung]
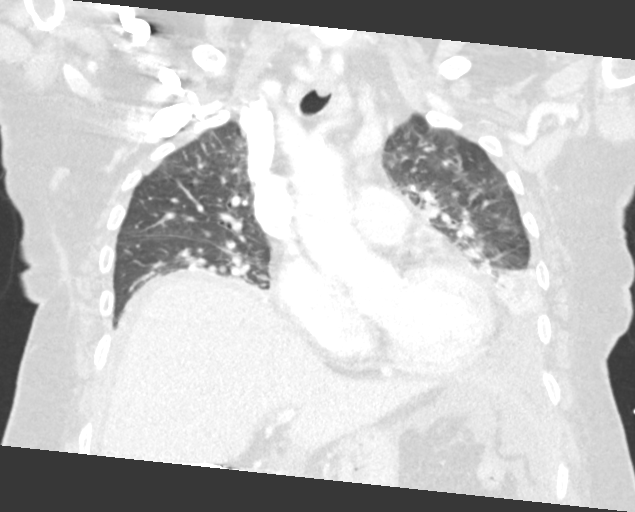
[im 73/121  lung]
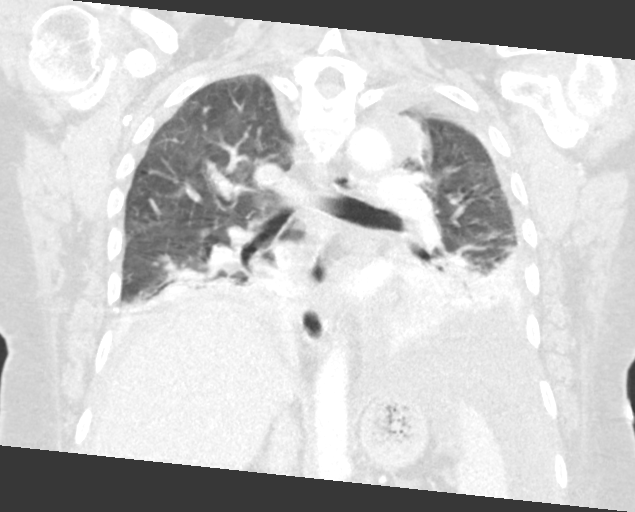

[15 of 36 positions shown; findings below may reference images not displayed]

FINDINGS: Cardiovascular: No significant vascular findings. Normal heart size.
No pericardial effusion.

Mediastinum/Nodes: No lymphadenopathy by imaging criteria. Thyroid
gland, trachea, and esophagus demonstrate no significant findings.

Lungs/Pleura: There are several stable subcentimeter pulmonary
nodules at the periphery of the right upper lobe and middle lobe.
Interval development of consolidations within the lower lobes
bilaterally, hazy ground-glass opacities of the upper lobes, and
interlobular septal thickening. Increased moderate bilateral pleural
effusions.

Upper Abdomen: Small volume of ascites. Prominent gastrohepatic
lymph nodes better characterized on prior CT of abdomen and pelvis.
Cholecystectomy.

Musculoskeletal: No chest wall abnormality. No acute or significant
osseous findings.
IMPRESSION: 1. Increased moderate bilateral pleural effusions, hazy ground-glass
opacities of the lungs, and smooth interlobular septal thickening.
Findings probably represent pulmonary edema.
2. Dependent lower lobe consolidations probably represent
atelectasis. Underlying pneumonia is possible.
3. Several stable subcentimeter pulmonary nodules at the periphery
of the right upper lobe and middle lobe. Lower lobe nodules are
obscured by consolidation. Consider reassessment for pulmonary
metastatic disease after resolution of acute airspace disease and
effusions.

By: Serkan Rodas M.D.

## 2019-03-21 IMAGING — US US THORACENTESIS ASP PLEURAL SPACE W/IMG GUIDE
1 series · 5 of 5 positions shown · non-contrast
Comparison: none

INDICATION: Bilateral pleural effusions.  Metastatic breast carcinoma.

[Series 1: us thoracentesis asp pleural space w/img guide · 5 of 5 slices shown]
[im 1/5]
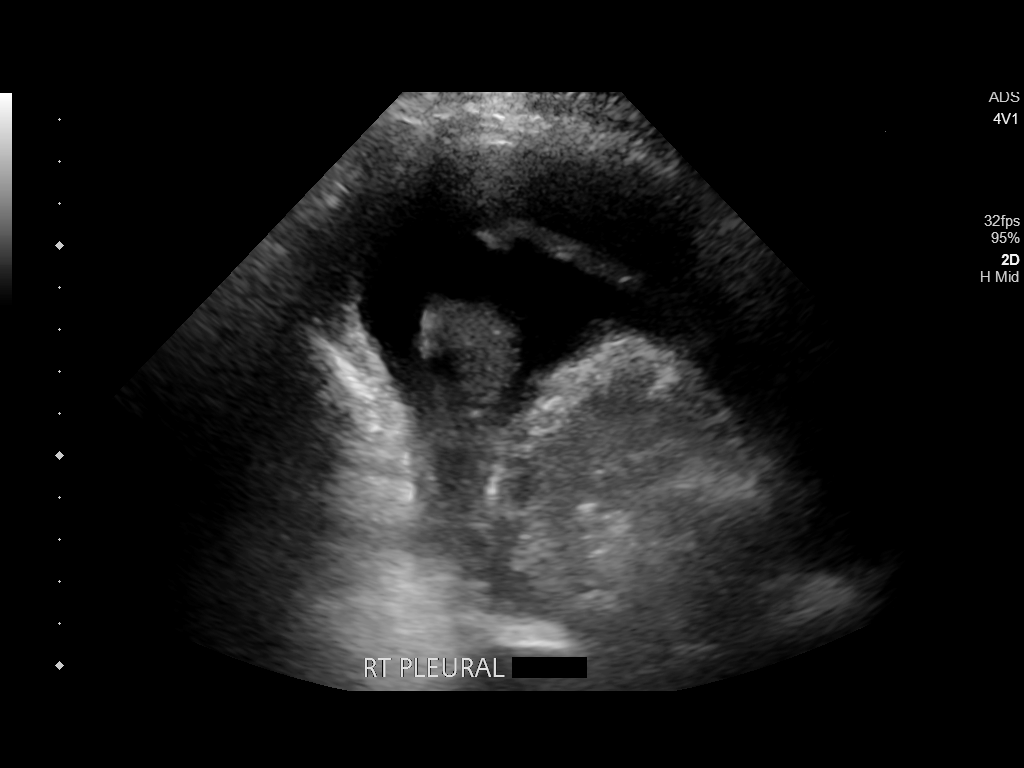
[im 2/5]
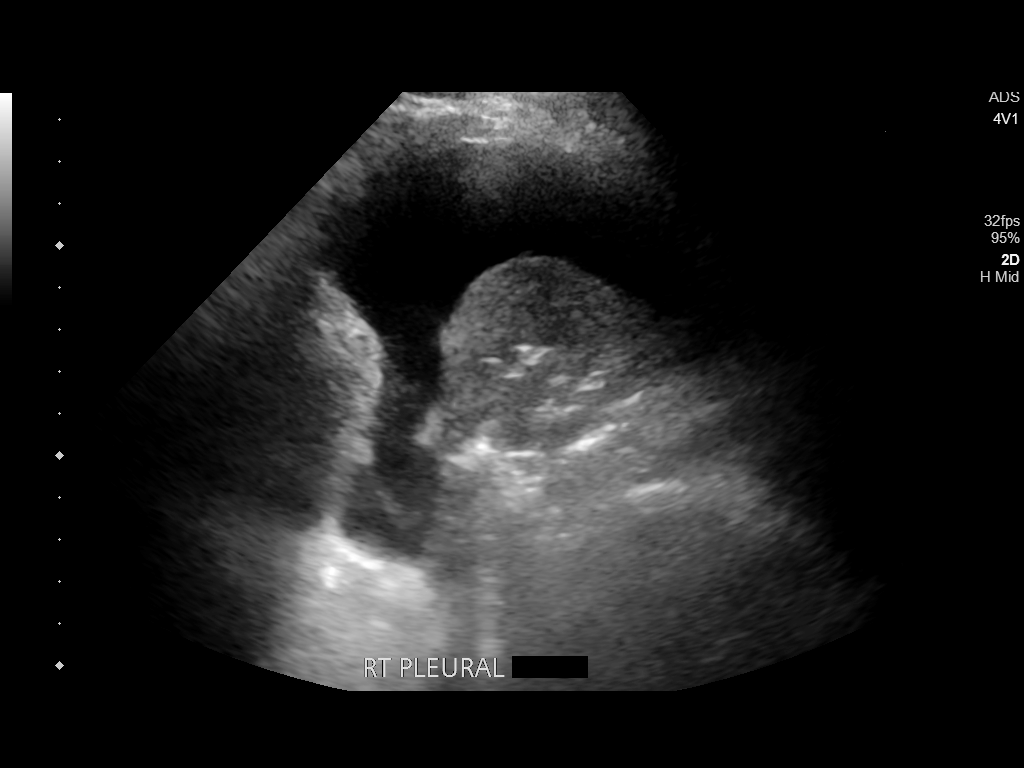
[im 3/5]
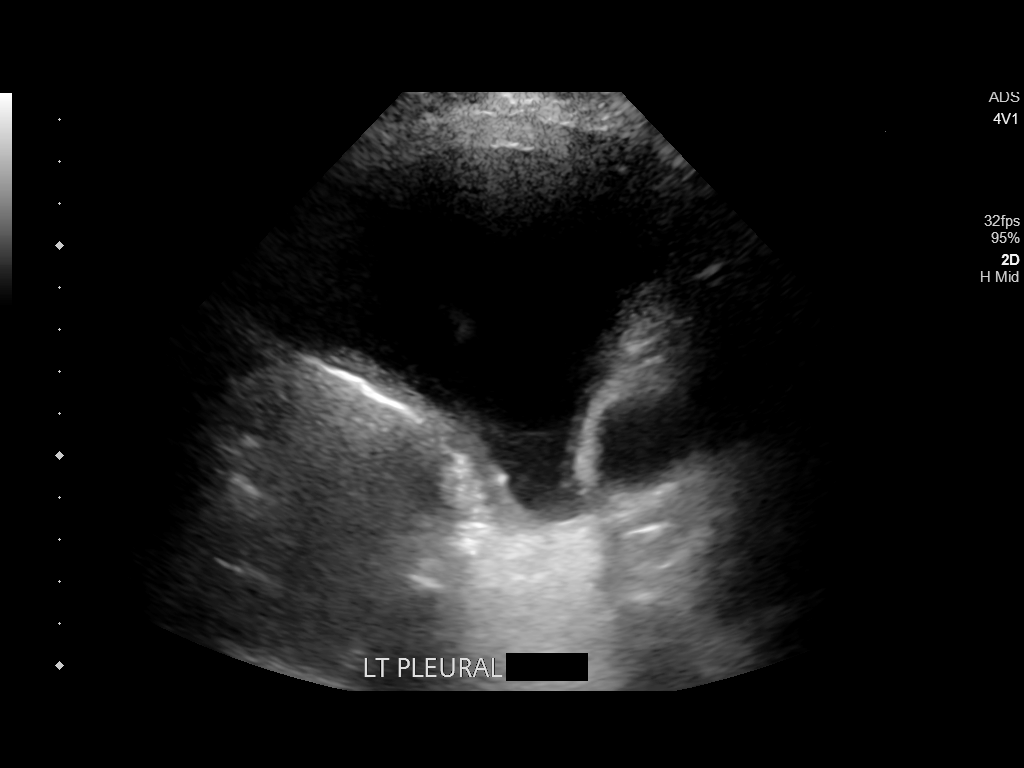
[im 4/5]
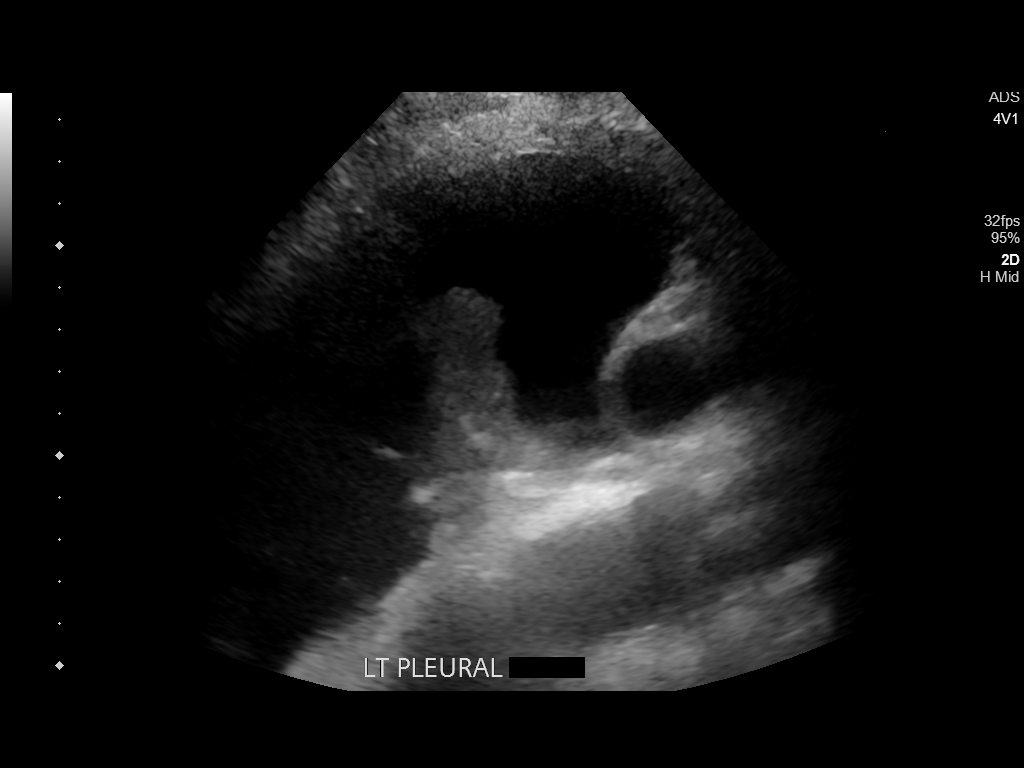
[im 5/5]
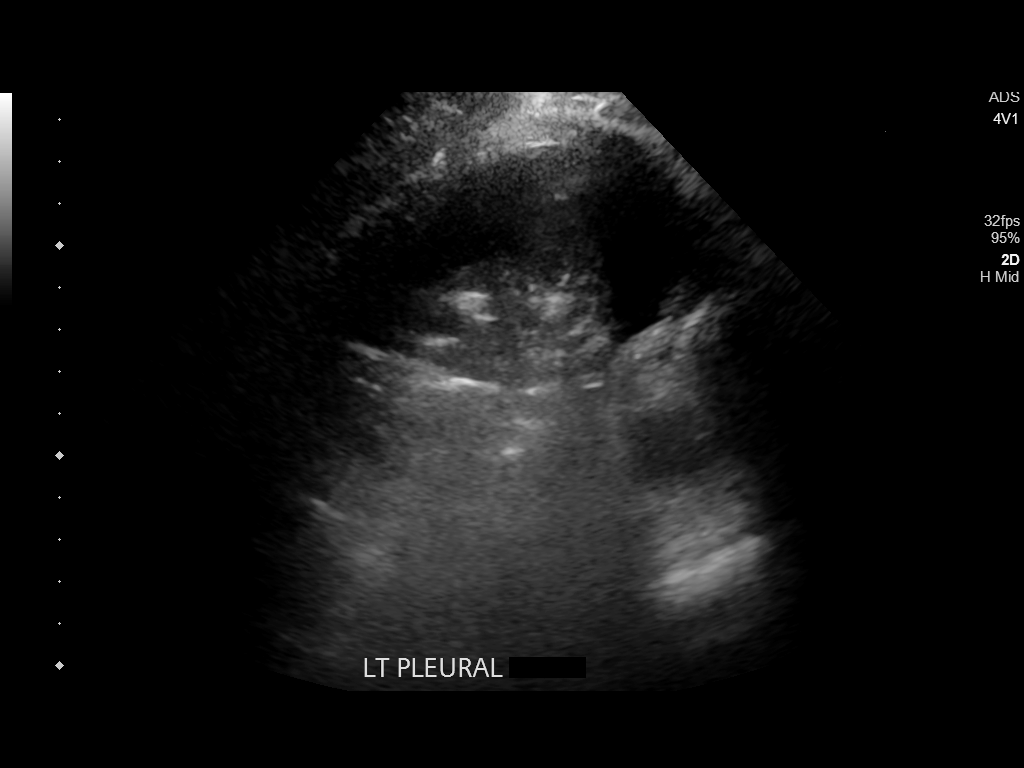

[5 of 5 positions shown; findings below may reference images not displayed]

EXAM:
ULTRASOUND GUIDED LEFT THORACENTESIS

MEDICATIONS:
LIDOCAINE 1% SUBCUTANEOUS

COMPLICATIONS:
None immediate.

PROCEDURE:
An ultrasound guided thoracentesis was thoroughly discussed with the
patient and questions answered. The benefits, risks, alternatives
and complications were also discussed. The patient understands and
wishes to proceed with the procedure. Written consent was obtained.

Survey ultrasound demonstrated bilateral pleural effusions, left
greater than right.

Ultrasound was performed to localize and mark an adequate pocket of
fluid in the left chest. The area was then prepped and draped in the
normal sterile fashion. 1% Lidocaine was used for local anesthesia.
Under ultrasound guidance a Safe-T-Centesis catheter was introduced.
Thoracentesis was performed. The catheter was removed and a dressing
applied.
FINDINGS: A total of approximately 500 mL of clear amber fluid was removed.
Samples were sent to the laboratory as requested by the clinical
team.
IMPRESSION: Successful ultrasound guided left thoracentesis yielding 500 mL of
pleural fluid.

Follow-up chest radiograph shows no pneumothorax.

## 2019-03-21 IMAGING — DX DG CHEST 1V PORT
1 series · 1 of 1 positions shown · non-contrast
Comparison: 07/30/2017

CLINICAL DATA: Breast cancer with gastric metastases. Right-sided
abdominal pain. Shortness of breath.

EXAM:
PORTABLE CHEST 1 VIEW

[chest ap]
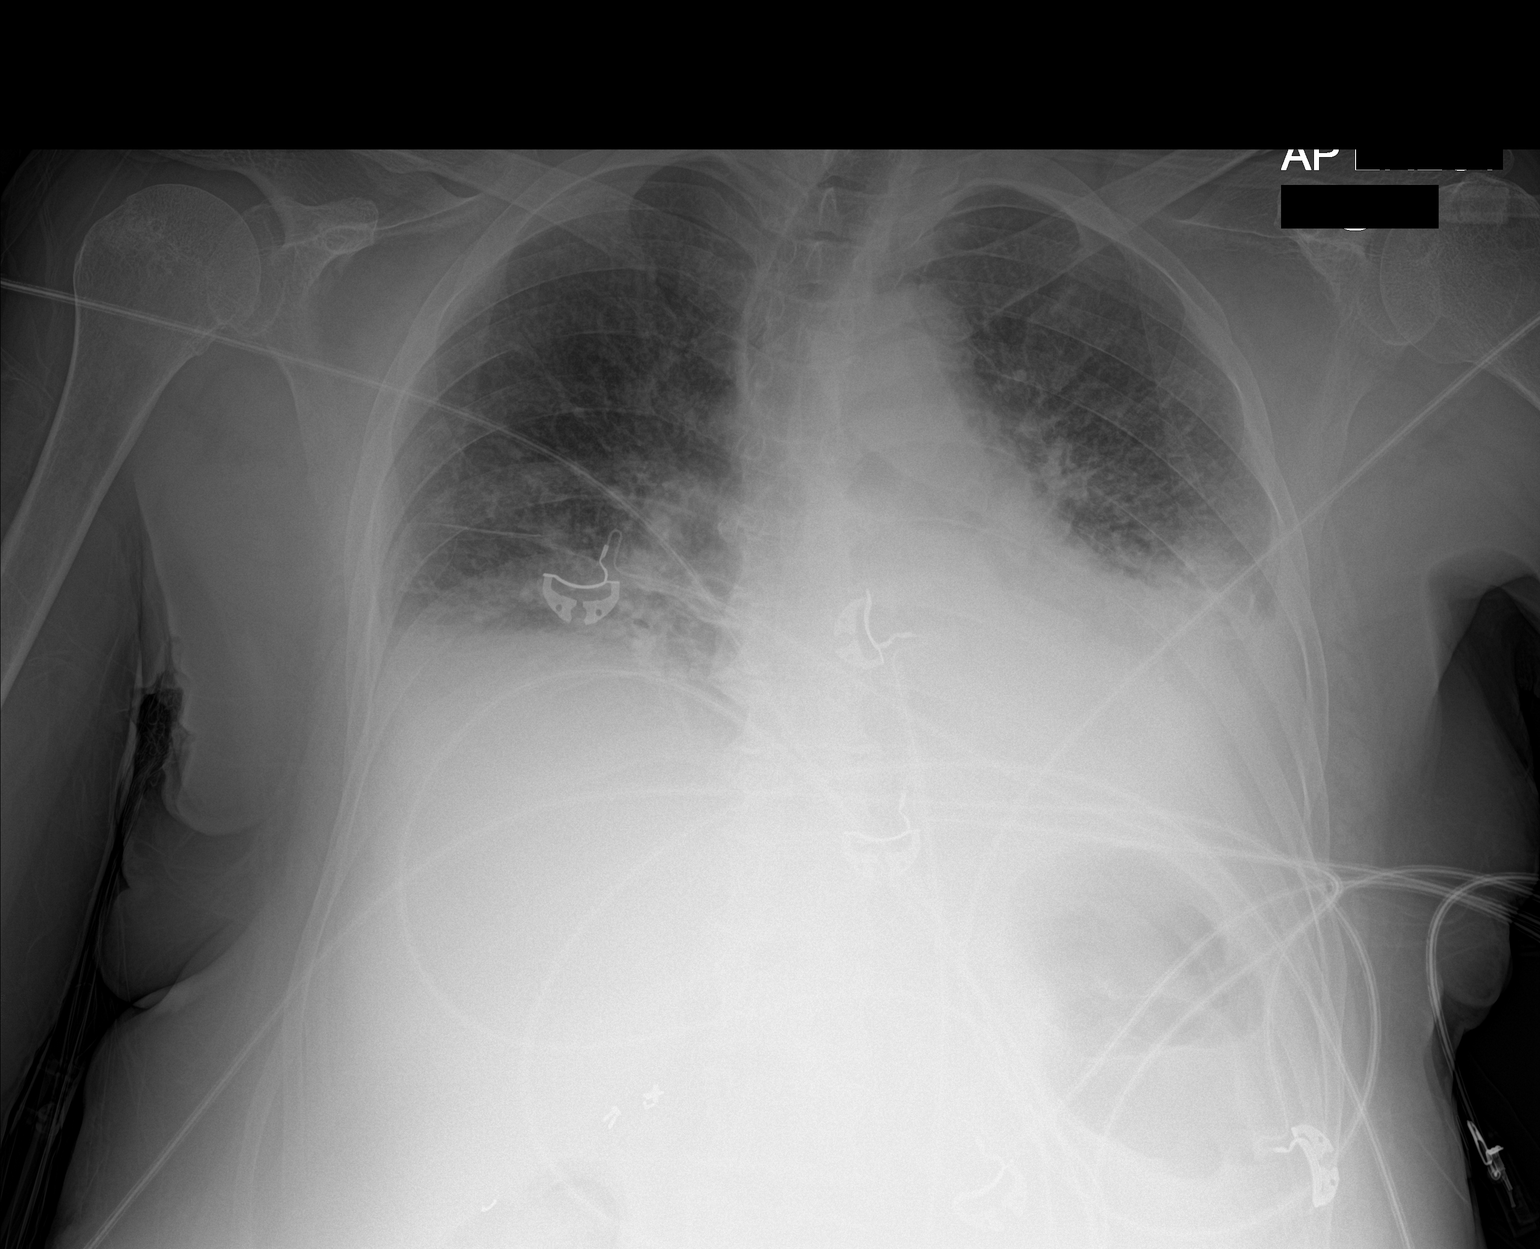

[1 of 1 positions shown; findings below may reference images not displayed]

FINDINGS: Shallow inspiration. Cardiac enlargement. Pulmonary vascularity is
normal. There is increasing left and developing right pleural
effusion with basilar atelectasis or consolidation. Interstitial
pattern to the lungs could reflect interstitial edema or
interstitial metastasis. No pneumothorax. Mediastinal contours
appear intact.
IMPRESSION: Cardiac enlargement. Increasing left and developing right pleural
effusion with basilar atelectasis or consolidation. Interstitial
pattern to the lungs may indicate interstitial edema or metastasis.
